# Patient Record
Sex: Female | Born: 1990 | Race: Black or African American | Hispanic: No | Marital: Single | Smoking: Never smoker
Health system: Southern US, Community
[De-identification: ages and names within clinical notes are randomized; demographics above are authoritative.]

## PROBLEM LIST (undated history)

## (undated) DIAGNOSIS — E669 Obesity, unspecified: Secondary | ICD-10-CM

## (undated) DIAGNOSIS — E0500 Thyrotoxicosis with diffuse goiter without thyrotoxic crisis or storm: Secondary | ICD-10-CM

## (undated) DIAGNOSIS — I509 Heart failure, unspecified: Secondary | ICD-10-CM

## (undated) DIAGNOSIS — D649 Anemia, unspecified: Secondary | ICD-10-CM

## (undated) DIAGNOSIS — I10 Essential (primary) hypertension: Secondary | ICD-10-CM

## (undated) DIAGNOSIS — R0600 Dyspnea, unspecified: Secondary | ICD-10-CM

## (undated) DIAGNOSIS — I219 Acute myocardial infarction, unspecified: Secondary | ICD-10-CM

## (undated) DIAGNOSIS — J189 Pneumonia, unspecified organism: Secondary | ICD-10-CM

## (undated) DIAGNOSIS — R519 Headache, unspecified: Secondary | ICD-10-CM

## (undated) DIAGNOSIS — F419 Anxiety disorder, unspecified: Secondary | ICD-10-CM

## (undated) DIAGNOSIS — J302 Other seasonal allergic rhinitis: Secondary | ICD-10-CM

## (undated) DIAGNOSIS — R51 Headache: Secondary | ICD-10-CM

## (undated) DIAGNOSIS — J42 Unspecified chronic bronchitis: Secondary | ICD-10-CM

## (undated) DIAGNOSIS — E119 Type 2 diabetes mellitus without complications: Secondary | ICD-10-CM

---

## 2011-12-21 DIAGNOSIS — I219 Acute myocardial infarction, unspecified: Secondary | ICD-10-CM

## 2011-12-21 HISTORY — DX: Acute myocardial infarction, unspecified: I21.9

## 2012-12-20 HISTORY — PX: EYE SURGERY: SHX253

## 2015-12-01 ENCOUNTER — Encounter (HOSPITAL_COMMUNITY): Payer: Self-pay | Admitting: *Deleted

## 2015-12-01 ENCOUNTER — Emergency Department (HOSPITAL_COMMUNITY)
Admission: EM | Admit: 2015-12-01 | Discharge: 2015-12-01 | Disposition: A | Discharge status: Home / Self Care | Payer: Medicaid Other | Attending: Emergency Medicine | Admitting: Emergency Medicine

## 2015-12-01 DIAGNOSIS — M79644 Pain in right finger(s): Secondary | ICD-10-CM | POA: Diagnosis not present

## 2015-12-01 DIAGNOSIS — L988 Other specified disorders of the skin and subcutaneous tissue: Secondary | ICD-10-CM | POA: Insufficient documentation

## 2015-12-01 DIAGNOSIS — K0889 Other specified disorders of teeth and supporting structures: Secondary | ICD-10-CM | POA: Diagnosis not present

## 2015-12-01 MED ORDER — PENICILLIN V POTASSIUM 500 MG PO TABS: 500.0000 mg | ORAL_TABLET | Freq: Four times a day (QID) | ORAL | Status: AC

## 2015-12-01 MED ORDER — IBUPROFEN 800 MG PO TABS: 800.0000 mg | ORAL_TABLET | Freq: Three times a day (TID) | ORAL | Status: DC

## 2015-12-01 NOTE — ED Notes (Signed)
Declined W/C at D/C and was escorted to lobby by RN. 

## 2015-12-01 NOTE — Discharge Instructions (Signed)
Dental Pain  ° ° °Dental pain may be caused by many things, including:  °Tooth decay (cavities or caries). Cavities expose the nerve of your tooth to air and hot or cold temperatures. This can cause pain or discomfort.  °Abscess or infection. A dental abscess is a collection of infected pus from a bacterial infection in the inner part of the tooth (pulp). It usually occurs at the end of the tooth's root.  °Injury.  °An unknown reason (idiopathic). °Your pain may be mild or severe. It may only occur when:  °You are chewing.  °You are exposed to hot or cold temperature.  °You are eating or drinking sugary foods or beverages, such as soda or candy. °Your pain may also be constant.  °HOME CARE INSTRUCTIONS  °Watch your dental pain for any changes. The following actions may help to lessen any discomfort that you are feeling:  °Take medicines only as directed by your dentist.  °If you were prescribed an antibiotic medicine, finish all of it even if you start to feel better.  °Keep all follow-up visits as directed by your dentist. This is important.  °Do not apply heat to the outside of your face.  °Rinse your mouth or gargle with salt water if directed by your dentist. This helps with pain and swelling.  °You can make salt water by adding ¼ tsp of salt to 1 cup of warm water. °Apply ice to the painful area of your face:  °Put ice in a plastic bag.  °Place a towel between your skin and the bag.  °Leave the ice on for 20 minutes, 2-3 times per day. °Avoid foods or drinks that cause you pain, such as:  °Very hot or very cold foods or drinks.  °Sweet or sugary foods or drinks. °SEEK MEDICAL CARE IF:  °Your pain is not controlled with medicines.  °Your symptoms are worse.  °You have new symptoms. °SEEK IMMEDIATE MEDICAL CARE IF:  °You are unable to open your mouth.  °You are having trouble breathing or swallowing.  °You have a fever.  °Your face, neck, or jaw is swollen. °This information is not intended to replace advice  given to you by your health care provider. Make sure you discuss any questions you have with your health care provider.  °Document Released: 12/06/2005 Document Revised: 04/22/2015 Document Reviewed: 12/02/2014  °Elsevier Interactive Patient Education ©2016 Elsevier Inc.  °Dental Abscess  ° ° °A dental abscess is a collection of pus in or around a tooth.  °CAUSES  °This condition is caused by a bacterial infection around the root of the tooth that involves the inner part of the tooth (pulp). It may result from:  °Severe tooth decay.  °Trauma to the tooth that allows bacteria to enter into the pulp, such as a broken or chipped tooth.  °Severe gum disease around a tooth. °SYMPTOMS  °Symptoms of this condition include:  °Severe pain in and around the infected tooth.  °Swelling and redness around the infected tooth, in the mouth, or in the face.  °Tenderness.  °Pus drainage.  °Bad breath.  °Bitter taste in the mouth.  °Difficulty swallowing.  °Difficulty opening the mouth.  °Nausea.  °Vomiting.  °Chills.  °Swollen neck glands.  °Fever. °DIAGNOSIS  °This condition is diagnosed with examination of the infected tooth. During the exam, your dentist may tap on the infected tooth. Your dentist will also ask about your medical and dental history and may order X-rays.  °TREATMENT  °This condition is   treated by eliminating the infection. This may be done with:  °Antibiotic medicine.  °A root canal. This may be performed to save the tooth.  °Pulling (extracting) the tooth. This may also involve draining the abscess. This is done if the tooth cannot be saved. °HOME CARE INSTRUCTIONS  °Take medicines only as directed by your dentist.  °If you were prescribed antibiotic medicine, finish all of it even if you start to feel better.  °Rinse your mouth (gargle) often with salt water to relieve pain or swelling.  °Do not drive or operate heavy machinery while taking pain medicine.  °Do not apply heat to the outside of your mouth.  °Keep  all follow-up visits as directed by your dentist. This is important. °SEEK MEDICAL CARE IF:  °Your pain is worse and is not helped by medicine. °SEEK IMMEDIATE MEDICAL CARE IF:  °You have a fever or chills.  °Your symptoms suddenly get worse.  °You have a very bad headache.  °You have problems breathing or swallowing.  °You have trouble opening your mouth.  °You have swelling in your neck or around your eye. °This information is not intended to replace advice given to you by your health care provider. Make sure you discuss any questions you have with your health care provider.  °Document Released: 12/06/2005 Document Revised: 04/22/2015 Document Reviewed: 12/03/2014  °Elsevier Interactive Patient Education ©2016 Elsevier Inc.  ° °Emergency Department Resource Guide °1) Find a Doctor and Pay Out of Pocket °Although you won't have to find out who is covered by your insurance plan, it is a good idea to ask around and get recommendations. You will then need to call the office and see if the doctor you have chosen will accept you as a new patient and what types of options they offer for patients who are self-pay. Some doctors offer discounts or will set up payment plans for their patients who do not have insurance, but you will need to ask so you aren't surprised when you get to your appointment. ° °2) Contact Your Local Health Department °Not all health departments have doctors that can see patients for sick visits, but many do, so it is worth a call to see if yours does. If you don't know where your local health department is, you can check in your phone book. The CDC also has a tool to help you locate your state's health department, and many state websites also have listings of all of their local health departments. ° °3) Find a Walk-in Clinic °If your illness is not likely to be very severe or complicated, you may want to try a walk in clinic. These are popping up all over the country in pharmacies, drugstores, and  shopping centers. They're usually staffed by nurse practitioners or physician assistants that have been trained to treat common illnesses and complaints. They're usually fairly quick and inexpensive. However, if you have serious medical issues or chronic medical problems, these are probably not your best option. ° °No Primary Care Doctor: °- Call [facility] at  832-8000 - they can help you locate a primary care doctor that  accepts your insurance, provides certain services, etc. °- Physician Referral Service- 1-800-533-3463 ° °Chronic Pain Problems: °Organization         Address  Phone   Notes  °[facility]  (336) 297-2271 Patients need to be referred by their primary care doctor.  ° °Medication Assistance: °Organization         Address    Phone   Notes  °[place] [facility] [address] °[place], [place] [address] (336) 641-8030 --Must be a resident of [place] °-- Must have NO insurance coverage whatsoever (no Medicaid/ Medicare, etc.) °-- The pt. MUST have a primary care doctor that directs their care regularly and follows them in the community °  °[facility]  (866) 331-1348   °[employer]  (888) 892-1162   ° °Agencies that provide inexpensive medical care: °Organization         Address  Phone   Notes  °[facility]  (336) 832-8035   °[facility]    (336) 832-7272   °[facility] [address] °[place], [place] [address] (336) 832-4777   °[facility] [address], °[place] (336) 271-4999   °[facility]    (336) 373-0678   °[facility]    (336) 272-1050   °[facility] ° [address], [place] Phone:  (336) 832-4444, Fax:  (336) 832-4440 Hours of Operation:  9 am - 6 pm, M-F.  Also accepts Medicaid/Medicare and self-pay.  °[facility] ° [address], [place] Phone: (336) 832-3150,  Fax: (336) 832-3151. Hours of Operation:  8:30 am - 5:30 pm, M-F.  Also accepts Medicaid and self-pay.  °[facility] [place] [address], [place] Phone: (336) 878-6027   °[facility] [address], [place], [place] (336)723-1848, Ext. 123 Mondays & Thursdays: 7-9 AM.  First 15 patients are seen on a first come, first serve basis. °  ° °Medicaid-accepting [place] Providers: ° °Organization         Address  Phone   Notes  °[facility] [address], [place] (336) 641-2100 Also accepts self-pay patients.  °[facility] [address], [place] ° (336) 856-9996   °[facility] [address], [place] (336) 288-8857   °[facility] [address] [place] [address], [place] (336) 299-7000   °Veita Bland [address], [place]  ° (336) 373-1557 Only accepts [place] Access Medicaid patients after they have their name applied to their card.  ° °Self-Pay (no insurance) in [place]: ° °Organization         Address  Phone   Notes  °Sickle Cell Patients, [facility] [address], [place] (336) 832-1970   °[facility] [address], [place] (336) 832-4400   ° [facility] ° [address], [place] (336) 992-4800   °[facility]/Dr. Osei-Bonsu ° [address] [place] [address], [place] or [address], [place] (336) [phone] Phone number for both [place] and [place] locations is the same.  °[facility] [address], [place] (336) 299-0000   °[facility] [address] [place] [address], [place] or [address] (336) 852-7530 °(336) 878-2260   °[facility] [address], [place] (336) 350-1642, phone; (336) 294-5005, fax Sees patients 1st and 3rd Saturday of every month.  Must not qualify for public or private  insurance (i.e. Medicaid, Medicare, [place] Health Choice, Veterans' Benefits) • Household income should be no more than 200% of the poverty level •The clinic cannot treat you if you are pregnant or think you are pregnant •   Sexually transmitted diseases are not treated at the clinic.  ° ° °Dental Care: °Organization         Address  Phone  Notes  °[facility] [address], [place] (336) 641-6152 Accepts children up to age 21 who are enrolled in Medicaid or [place] Health Choice; pregnant women with a Medicaid card; and children who have applied for Medicaid or [place] Health Choice, but were declined, whose parents can pay a reduced fee at time of service.  °[facility] [place]  [address], [place] (336) 641-7733 Accepts children up to age 21 who are enrolled in Medicaid or [place] Health Choice; pregnant women with a Medicaid card; and children who have applied for Medicaid or [place] Health Choice, but were declined, whose parents can pay a reduced fee at time of service.  °[facility] ° [address], [place] (336) 641-4533 Patients are seen by appointment only. Walk-ins are not accepted. [facility] will see patients 18 years of age and older. °Monday - Tuesday (8am-5pm) °Most Wednesdays (8:30-5pm) °$30 per visit, cash only  °[facility] ° [address], [place] (336) 641-4533 Patients are seen by appointment only. Walk-ins are not accepted. [facility] will see patients 18 years of age and older. °One Wednesday Evening (Monthly: Volunteer Based).  $30 per visit, cash only  °[facility]  (919) 537-3737 for adults; Children under age 4, call Graduate Pediatric Dentistry at (919) [phone]. Children aged 4-14, please call (919) [phone] to request a pediatric application. ° Dental services are provided in all areas of dental care  including fillings, crowns and bridges, complete and partial dentures, implants, gum treatment, root canals, and extractions. Preventive care is also provided. Treatment is provided to both adults and children. °Patients are selected via a lottery and there is often a waiting list. °  °[facility] [address], °[place] ° (336) 763-8833 [email or web address] °  °[facility] [address], [place], [place] (336)723-1848, Ext. 123 Second and Fourth Thursday of each month, opens at 6:30 AM; Clinic ends at 9 AM.  Patients are seen on a first-come first-served basis, and a limited number are seen during each clinic.  ° °[facility] ° [address], [place], [place] (336) 723-7904   Eligibility Requirements °You must have lived in [place], [place], or [place] counties for at least the last three months. °  You cannot be eligible for state or federal sponsored healthcare insurance, including Veterans Administration, Medicaid, or Medicare. °  You generally cannot be eligible for healthcare insurance through your employer.  °  How to apply: °Eligibility screenings are held every Tuesday and Wednesday afternoon from 1:00 pm until 4:00 pm. You do not need an appointment for the interview!  °[facility] [address], [place], [place] 336-631-2330   °[facility] [facility]  336-342-8273   °[facility]  336-703-3100   °[facility]  336-570-6415   ° °Behavioral Health Resources in the Community: °Intensive Outpatient Programs °Organization         Address  Phone  Notes  °[facility] [address], [place], [place] 336-878-6098   °[facility] [address], [place], [place] 336-832-9800   °[facility] [address], , [place] °   336-882-2125   °[facility] [address],  °[place], [place] 1-800-853-5163 or 336-641-4981     °Substance Abuse Resources °Organization         Address  Phone  Notes  °[facility]  336-882-2125   °[facility]  336-784-9470   °[facility]  336-285-9073   °[facility]  336-845-3988   °Residential & Outpatient Substance Abuse Program  1-800-659-3381   °Psychological Services °Organization         Address  Phone  Notes  °[facility]  336- 832-9600   °[facility]  336- 378-7881   °[facility] [address], [place] 1-800-853-5163 or 336-641-4981   ° °Mobile Crisis Teams °Organization         Address  Phone  Notes  °[facility], Mobile Crisis Care Unit  1-877-626-1772   °Assertive °[facility] ° [address] [place], [place] 336-834-9664   °Sharon DeEsch [address] °[place] 336-554-5454   ° °Self-Help/Support Groups °Organization         Address  Phone             Notes  °[facility] [place] - variety of support groups  336- 373-1402 Call for more information  °Narcotics Anonymous (NA), [facility] [address], °[place]  2 meetings at this location  ° °Residential Treatment Programs °Organization         Address  Phone  Notes  °[facility] [address],    °[place]  1-866-801-8205   °[facility] ° [address], [place], [place] 704-293-8524   °[facility] [address], [place] 336-845-3988 Admissions: 8am-3pm M-F  °[facility] [address]    °[place], [place] 336-841-1104   °[facility] [address], [place], [place] 336-379-7146   °[facility] [address]  °[place], [place] 336-285-9073   °[facility] - Intensive Outpatient [address], [place], [place] 336-852-3033   °[facility] [address]  °[place], [place] 1-877-615-2722 or 336-784-9470   °[facility] [address] , [place] 336-227-7417 Accepts Medicaid  °[facility] [address]  °[place] 1-800-659-3381 Substance Abuse/Addiction Treatment  ° °[place] Behavioral Health Resources °Organization         Address  Phone  Notes  °[facility]  (888) 581-9988   °Julie Brannon, PhD [address] [place], [place]   (336) 349-5553 or (336) 951-0000   °[facility]   [address] °[place], [place] (336) 349-4454   °[facility] [address], [place], [place] (336) 342-8316 Insurance/Medicaid/sponsorship through [facility]  °[facility] [address]                                    [place], [place] (336) 342-8316 Therapy/tele-psych/case  °[facility] [address]  ° [place], [place] (336) 349-2233    °Dr. Arfeen  (336) 349-4544   °[facility] [place]  [employer] [place] [facility] 1) [address], [place] °2) [address], [place] °3)  [address], [place] (336) 349-3220 °(336) 342-7768 ° °(336) 342-8140   °[place] [facility] (336) 342-1394 or (  336) 342-3537 (After Hours)    ° ° ° °

## 2015-12-01 NOTE — ED Notes (Signed)
PT reports infection at wisdom tooth Lt side

## 2015-12-01 NOTE — ED Provider Notes (Signed)
CSN: [id number]     Arrival date & time 12/01/15  1058 History   By signing my name below, I, Freida Busman, attest that this documentation has been prepared under the direction and in the presence of non-physician practitioner, Cheri Fowler, PA-C. Electronically Signed: Freida Busman, Scribe. 12/01/2015. 12:23 PM.    Chief Complaint  Patient presents with  . Dental Problem    The history is provided by the patient. No language interpreter was used.     HPI Comments:  Tara [name] is a 24 y.o. female who presents to the Emergency Department complaining of moderate left lower dental pain which began last night. Her pain is exacerbated with cold beverages. She has taken tylenol without relief. Pt denies fever, chills, neck stiffness, difficulty breathing, and difficulty swallowing.   Pt also complains of right thumb pain. She notes this is recurrent issue x months. Pt states he has a small crack at the site that has reopened. She denies recent injury. No alleviating factors noted.   History reviewed. No pertinent past medical history. History reviewed. No pertinent past surgical history. History reviewed. No pertinent family history. Social History  Substance Use Topics  . Smoking status: Never Smoker   . Smokeless tobacco: Never Used  . Alcohol Use: No   OB History    No data available     Review of Systems  Constitutional: Negative for fever and chills.  HENT: Positive for dental problem.   Respiratory: Negative for shortness of breath.   Cardiovascular: Negative for chest pain.  Musculoskeletal: Positive for arthralgias (finger pain ).  All other systems reviewed and are negative.   Allergies  Iodine  Home Medications   Prior to Admission medications   Medication Sig Start Date End Date Taking? Authorizing Provider  ibuprofen (ADVIL,MOTRIN) 800 MG tablet Take 1 tablet (800 mg total) by mouth 3 (three) times daily. 12/01/15   Cheri Fowler, PA-C  penicillin v  potassium (VEETID) 500 MG tablet Take 1 tablet (500 mg total) by mouth 4 (four) times daily. 12/01/15 12/08/15  Lounette Sloan, PA-C   BP 143/74 mmHg  Pulse 70  Temp(Src) 98.4 F (36.9 C) (Oral)  Resp 16  Ht 5\' 6"  (1.676 m)  Wt 127.007 kg  BMI 45.21 kg/m2  SpO2 99%  LMP 11/03/2015 Physical Exam  Constitutional: She is oriented to person, place, and time. She appears well-developed and well-nourished. No distress.  HENT:  Head: Normocephalic and atraumatic.  Right Ear: External ear normal.  Left Ear: External ear normal.  Mouth/Throat: Uvula is midline, oropharynx is clear and moist and mucous membranes are normal. No trismus in the jaw. Normal dentition. No uvula swelling or dental caries. No oropharyngeal exudate, posterior oropharyngeal edema, posterior oropharyngeal erythema or tonsillar abscesses.    No facial swelling.  Tolerating secretions without difficulty.  Eyes: Conjunctivae are normal.  Neck: Normal range of motion. Neck supple.  No signs of Ludwig angina.   Cardiovascular: Normal rate, regular rhythm and normal heart sounds.   Pulmonary/Chest: Effort normal and breath sounds normal.  Abdominal: She exhibits no distension.  Musculoskeletal: Normal range of motion.  Lymphadenopathy:    She has no cervical adenopathy.  Neurological: She is alert and oriented to person, place, and time.  Skin: Skin is warm and dry.  Dry cracked skin at the fingertips, most notably in right thumb.  No erythema, warmth, drainage, induration,  Fluctuance, or other signs of infection.  Psychiatric: She has a normal mood and affect.  Nursing  note and vitals reviewed.   ED Course  Procedures   DIAGNOSTIC STUDIES:  Oxygen Saturation is 99% on RA, normal by my interpretation.    COORDINATION OF CARE:  12:22 PM Discussed treatment plan with pt at bedside and pt agreed to plan.   MDM   Final diagnoses:  Pain, dental    Suspect dental pain associated with dental infection and  possible dental abscess with patient afebrile, non toxic appearing and swallowing secretions well. I gave patient referral to dentist and stressed the importance of dental follow up for ultimate management of dental pain. Discussed return precautions.  Patient expresses understanding and agrees with plan.  I will also give penicillin VK and pain control.    I personally performed the services described in this documentation, which was scribed in my presence. The recorded information has been reviewed and is accurate.    Cheri Fowler, PA-C 12/01/15 1228  Mancel Bale, MD 12/02/15 [phone]

## 2015-12-07 ENCOUNTER — Telehealth: Payer: Self-pay | Admitting: *Deleted

## 2015-12-07 NOTE — Telephone Encounter (Signed)
Good Rx Card texted to patient for 28 tablets Veetids at [facility] $10.00. Patient instructed to complete entire prescription. Carlye Grippe reported this was more affordable than the 20.00 she had originally been quoted by the pharmacy. No further CM needs.

## 2015-12-12 ENCOUNTER — Emergency Department (HOSPITAL_COMMUNITY): Payer: Medicaid Other

## 2015-12-12 ENCOUNTER — Encounter (HOSPITAL_COMMUNITY): Payer: Self-pay | Admitting: *Deleted

## 2015-12-12 ENCOUNTER — Emergency Department (HOSPITAL_COMMUNITY)
Admission: EM | Admit: 2015-12-12 | Discharge: 2015-12-12 | Disposition: A | Discharge status: Home / Self Care | Payer: Medicaid Other | Attending: Emergency Medicine | Admitting: Emergency Medicine

## 2015-12-12 DIAGNOSIS — O99281 Endocrine, nutritional and metabolic diseases complicating pregnancy, first trimester: Secondary | ICD-10-CM | POA: Diagnosis not present

## 2015-12-12 DIAGNOSIS — Z79899 Other long term (current) drug therapy: Secondary | ICD-10-CM | POA: Diagnosis not present

## 2015-12-12 DIAGNOSIS — O9989 Other specified diseases and conditions complicating pregnancy, childbirth and the puerperium: Secondary | ICD-10-CM | POA: Diagnosis present

## 2015-12-12 DIAGNOSIS — E0590 Thyrotoxicosis, unspecified without thyrotoxic crisis or storm: Secondary | ICD-10-CM | POA: Insufficient documentation

## 2015-12-12 DIAGNOSIS — O26899 Other specified pregnancy related conditions, unspecified trimester: Secondary | ICD-10-CM

## 2015-12-12 DIAGNOSIS — Z3A01 Less than 8 weeks gestation of pregnancy: Secondary | ICD-10-CM | POA: Insufficient documentation

## 2015-12-12 DIAGNOSIS — R1030 Lower abdominal pain, unspecified: Secondary | ICD-10-CM | POA: Insufficient documentation

## 2015-12-12 DIAGNOSIS — Z3490 Encounter for supervision of normal pregnancy, unspecified, unspecified trimester: Secondary | ICD-10-CM

## 2015-12-12 DIAGNOSIS — R109 Unspecified abdominal pain: Secondary | ICD-10-CM

## 2015-12-12 DIAGNOSIS — Z791 Long term (current) use of non-steroidal anti-inflammatories (NSAID): Secondary | ICD-10-CM | POA: Insufficient documentation

## 2015-12-12 LAB — CBC WITH DIFFERENTIAL/PLATELET
BASOS PCT: 0 %
Basophils Absolute: 0 10*3/uL (ref 0.0–0.1)
EOS ABS: 0.1 10*3/uL (ref 0.0–0.7)
EOS PCT: 2 %
HCT: 37.9 % (ref 36.0–46.0)
Hemoglobin: 12.1 g/dL (ref 12.0–15.0)
LYMPHS ABS: 2.8 10*3/uL (ref 0.7–4.0)
Lymphocytes Relative: 38 %
MCH: 23.4 pg — AB (ref 26.0–34.0)
MCHC: 31.9 g/dL (ref 30.0–36.0)
MCV: 73.3 fL — ABNORMAL LOW (ref 78.0–100.0)
MONOS PCT: 6 %
Monocytes Absolute: 0.5 10*3/uL (ref 0.1–1.0)
NEUTROS PCT: 54 %
Neutro Abs: 4 10*3/uL (ref 1.7–7.7)
PLATELETS: 303 10*3/uL (ref 150–400)
RBC: 5.17 MIL/uL — AB (ref 3.87–5.11)
RDW: 15.2 % (ref 11.5–15.5)
WBC: 7.5 10*3/uL (ref 4.0–10.5)

## 2015-12-12 LAB — COMPREHENSIVE METABOLIC PANEL
ALBUMIN: 3 g/dL — AB (ref 3.5–5.0)
ALT: 18 U/L (ref 14–54)
ANION GAP: 6 (ref 5–15)
AST: 18 U/L (ref 15–41)
Alkaline Phosphatase: 123 U/L (ref 38–126)
BUN: 7 mg/dL (ref 6–20)
CHLORIDE: 110 mmol/L (ref 101–111)
CO2: 23 mmol/L (ref 22–32)
Calcium: 8.7 mg/dL — ABNORMAL LOW (ref 8.9–10.3)
Creatinine, Ser: 0.5 mg/dL (ref 0.44–1.00)
GFR calc non Af Amer: >60 mL/min (ref >60)
GLUCOSE: 107 mg/dL — AB (ref 65–99)
Potassium: 3.7 mmol/L (ref 3.5–5.1)
SODIUM: 139 mmol/L (ref 135–145)
Total Bilirubin: 0.5 mg/dL (ref 0.3–1.2)
Total Protein: 5.9 g/dL — ABNORMAL LOW (ref 6.5–8.1)

## 2015-12-12 LAB — URINALYSIS, ROUTINE W REFLEX MICROSCOPIC
BILIRUBIN URINE: NEGATIVE
GLUCOSE, UA: NEGATIVE mg/dL
Hgb urine dipstick: NEGATIVE
Ketones, ur: 15 mg/dL — AB
LEUKOCYTES UA: NEGATIVE
NITRITE: NEGATIVE
Protein, ur: 30 mg/dL — AB
Specific Gravity, Urine: 1.024 (ref 1.005–1.030)
pH: 5.5 (ref 5.0–8.0)

## 2015-12-12 LAB — I-STAT BETA HCG BLOOD, ED (MC, WL, AP ONLY): HCG, QUANTITATIVE: 699.2 m[IU]/mL — AB (ref <5)

## 2015-12-12 LAB — URINE MICROSCOPIC-ADD ON

## 2015-12-12 MED ORDER — METHIMAZOLE 5 MG PO TABS: 5.0000 mg | ORAL_TABLET | Freq: Every day | ORAL | Status: DC

## 2015-12-12 MED ORDER — GOODSENSE PRENATAL VITAMINS 28-0.8 MG PO TABS: 1.0000 | ORAL_TABLET | Freq: Every day | ORAL | Status: DC

## 2015-12-12 NOTE — Care Management (Signed)
Patient had a + pregnancy test in the ED today,.ED CM met with patient regarding follow patient not having a PCP or, OB follow up.  Patient states, she recently relocated to the area, and does not have any one as of yet. Patient has Medicaid, Referral sent to [facility]. CM verified patient's contact information.  Patient made aware Clinic will contact her to arrange follow up appt.  Teach back done, verbalized understanding. No further ED CM needs identifed

## 2015-12-12 NOTE — ED Notes (Signed)
Pt reports lower abd pain and pressure, denies vaginal discharge, itching or bleeding. Denies urinary symptoms. This am had + home preg test.

## 2015-12-12 NOTE — ED Notes (Signed)
Pt is in stable condition upon d/c and ambulates from ED. 

## 2015-12-12 NOTE — ED Notes (Signed)
Patient transported to Ultrasound 

## 2015-12-12 NOTE — ED Provider Notes (Signed)
CSN: [id number]     Arrival date & time 12/12/15  1317 History   First MD Initiated Contact with Patient 12/12/15 1348     Chief Complaint  Patient presents with  . Abdominal Pain     Patient is a 24 y.o. female presenting with abdominal pain. The history is provided by the patient.  Abdominal Pain Pain location: lower abd pain. Pain quality: pressure   Pain radiates to:  Does not radiate Pain severity:  Mild Onset quality:  Gradual Duration: several days. Timing:  Constant Progression:  Unchanged Chronicity:  New Relieved by:  Nothing Worsened by:  Nothing tried Associated symptoms: no diarrhea, no dysuria, no fever, no vaginal bleeding, no vaginal discharge and no vomiting   Risk factors: pregnancy     PMH - Hyperthyroid/Graves Disease Social History  Substance Use Topics  . Smoking status: Never Smoker   . Smokeless tobacco: Never Used  . Alcohol Use: No   OB History    No data available     Review of Systems  Constitutional: Negative for fever.  Gastrointestinal: Positive for abdominal pain. Negative for vomiting and diarrhea.  Genitourinary: Negative for dysuria, vaginal bleeding and vaginal discharge.  All other systems reviewed and are negative.     Allergies  Iodine  Home Medications   Prior to Admission medications   Medication Sig Start Date End Date Taking? Authorizing Provider  methimazole (TAPAZOLE) 5 MG tablet Take 5 mg by mouth 3 (three) times daily.   Yes Historical Provider, MD  metoprolol (LOPRESSOR) 50 MG tablet Take 50 mg by mouth 2 (two) times daily.   Yes Historical Provider, MD  ibuprofen (ADVIL,MOTRIN) 800 MG tablet Take 1 tablet (800 mg total) by mouth 3 (three) times daily. 12/01/15   Kayla Rose, PA-C   BP 123/78 mmHg  Pulse 87  Temp(Src) 98.3 F (36.8 C) (Oral)  Resp 14  SpO2 100%  LMP 11/03/2015 Physical Exam CONSTITUTIONAL: Well developed/well nourished HEAD: Normocephalic/atraumatic EYES: chronic exopthalmos ENMT:  Mucous membranes moist NECK: supple no meningeal signs CV: S1/S2 noted, no murmurs/rubs/gallops noted LUNGS: Lungs are clear to auscultation bilaterally, no apparent distress ABDOMEN: soft, nontender, no rebound or guarding, bowel sounds noted throughout abdomen GU:no cva tenderness NEURO: Pt is awake/alert/appropriate, moves all extremitiesx4.  No facial droop.   EXTREMITIES: pulses normal/equal, full ROM SKIN: warm, color normal PSYCH: no abnormalities of mood noted, alert and oriented to situation  ED Course  Procedures  Labs Review Labs Reviewed  URINALYSIS, ROUTINE W REFLEX MICROSCOPIC (NOT AT [facility]) - Abnormal; Notable for the following:    Color, Urine AMBER (*)    APPearance CLOUDY (*)    Ketones, ur 15 (*)    Protein, ur 30 (*)    All other components within normal limits  URINE MICROSCOPIC-ADD ON - Abnormal; Notable for the following:    Squamous Epithelial / LPF 0-5 (*)    Bacteria, UA RARE (*)    All other components within normal limits  CBC WITH DIFFERENTIAL/PLATELET - Abnormal; Notable for the following:    RBC 5.17 (*)    MCV 73.3 (*)    MCH 23.4 (*)    All other components within normal limits  COMPREHENSIVE METABOLIC PANEL - Abnormal; Notable for the following:    Glucose, Bld 107 (*)    Calcium 8.7 (*)    Total Protein 5.9 (*)    Albumin 3.0 (*)    All other components within normal limits  I-STAT BETA HCG BLOOD, ED ([facility],  [facility], [facility] ONLY) - Abnormal; Notable for the following:    I-stat hCG, quantitative 699.2 (*)    All other components within normal limits  RPR  HIV ANTIBODY (ROUTINE TESTING)  GC/CHLAMYDIA PROBE AMP ([place]) NOT AT [facility]   I have personally reviewed and evaluated these  lab results as part of my medical decision-making.  2:52 PM Pt just found she is pregnant with abd pressure Will need to proceed with OB ultrasound She also is supposed to be on meds for HTN and Graves disease but has not had them in several days as she has no local  PCP/OBGYN Will call OB for guidance 3:59 PM D/w dr Adrian Blackwater with OB We reviewed meds Will stop lopressor as HR/BP normal Continue tapazole 5mg  daily Will need f/u in high risk clinic 4:19 PM US reveals probable early pregnancy, but recommend repeat US in next 7-14 days Pt will need close f/u due to her co-morbidities Pt well appearing, no distress, talking on phone She wants to go home and refuses pelvic exam (she denies h/o STD and no exposure to STD) Will also place on prenatal vitamins  MDM   Final diagnoses:  Pregnancy  Abdominal pain, unspecified abdominal location    Nursing notes including past medical history and social history reviewed and considered in documentation Labs/vital reviewed myself and considered during evaluation     Zadie Rhine, MD 12/12/15 1620

## 2015-12-13 LAB — RPR: RPR: NONREACTIVE

## 2015-12-13 LAB — HIV ANTIBODY (ROUTINE TESTING W REFLEX): HIV Screen 4th Generation wRfx: NONREACTIVE

## 2015-12-17 ENCOUNTER — Encounter (HOSPITAL_COMMUNITY): Payer: Self-pay | Admitting: Emergency Medicine

## 2015-12-17 ENCOUNTER — Emergency Department (HOSPITAL_COMMUNITY)
Admission: EM | Admit: 2015-12-17 | Discharge: 2015-12-17 | Discharge status: Against Medical Advice | Payer: Medicaid Other | Attending: Emergency Medicine | Admitting: Emergency Medicine

## 2015-12-17 DIAGNOSIS — O99211 Obesity complicating pregnancy, first trimester: Secondary | ICD-10-CM | POA: Insufficient documentation

## 2015-12-17 DIAGNOSIS — R110 Nausea: Secondary | ICD-10-CM | POA: Diagnosis not present

## 2015-12-17 DIAGNOSIS — O10011 Pre-existing essential hypertension complicating pregnancy, first trimester: Secondary | ICD-10-CM | POA: Diagnosis not present

## 2015-12-17 DIAGNOSIS — R1084 Generalized abdominal pain: Secondary | ICD-10-CM | POA: Diagnosis not present

## 2015-12-17 DIAGNOSIS — O9989 Other specified diseases and conditions complicating pregnancy, childbirth and the puerperium: Secondary | ICD-10-CM | POA: Diagnosis present

## 2015-12-17 DIAGNOSIS — Z3A01 Less than 8 weeks gestation of pregnancy: Secondary | ICD-10-CM | POA: Insufficient documentation

## 2015-12-17 DIAGNOSIS — E669 Obesity, unspecified: Secondary | ICD-10-CM | POA: Insufficient documentation

## 2015-12-17 HISTORY — DX: Essential (primary) hypertension: I10

## 2015-12-17 HISTORY — DX: Thyrotoxicosis with diffuse goiter without thyrotoxic crisis or storm: E05.00

## 2015-12-17 LAB — I-STAT BETA HCG BLOOD, ED (MC, WL, AP ONLY): I-stat hCG, quantitative: >2000 m[IU]/mL — ABNORMAL HIGH (ref <5)

## 2015-12-17 LAB — COMPREHENSIVE METABOLIC PANEL
ALK PHOS: 118 U/L (ref 38–126)
ALT: 21 U/L (ref 14–54)
ANION GAP: 10 (ref 5–15)
AST: 21 U/L (ref 15–41)
Albumin: 3.3 g/dL — ABNORMAL LOW (ref 3.5–5.0)
BILIRUBIN TOTAL: 0.5 mg/dL (ref 0.3–1.2)
BUN: 7 mg/dL (ref 6–20)
CALCIUM: 9 mg/dL (ref 8.9–10.3)
CO2: 22 mmol/L (ref 22–32)
CREATININE: 0.53 mg/dL (ref 0.44–1.00)
Chloride: 108 mmol/L (ref 101–111)
GFR calc non Af Amer: >60 mL/min (ref >60)
Glucose, Bld: 83 mg/dL (ref 65–99)
Potassium: 3.8 mmol/L (ref 3.5–5.1)
Sodium: 140 mmol/L (ref 135–145)
TOTAL PROTEIN: 6.8 g/dL (ref 6.5–8.1)

## 2015-12-17 LAB — CBC
HCT: 34.5 % — ABNORMAL LOW (ref 36.0–46.0)
Hemoglobin: 10.7 g/dL — ABNORMAL LOW (ref 12.0–15.0)
MCH: 22.7 pg — ABNORMAL LOW (ref 26.0–34.0)
MCHC: 31 g/dL (ref 30.0–36.0)
MCV: 73.2 fL — ABNORMAL LOW (ref 78.0–100.0)
PLATELETS: 274 10*3/uL (ref 150–400)
RBC: 4.71 MIL/uL (ref 3.87–5.11)
RDW: 15.3 % (ref 11.5–15.5)
WBC: 5.7 10*3/uL (ref 4.0–10.5)

## 2015-12-17 LAB — LIPASE, BLOOD: Lipase: 18 U/L (ref 11–51)

## 2015-12-17 NOTE — ED Notes (Addendum)
Pt from home for eval of  Generalized abd pain x3 days pt reports some nausea denies any fevers or diarrhea at this time. Denies any vaginal discharge, or other urinary symptoms. nad noted. Pt reports approx [redacted] weeks pregnant. Denies any vaginal bleeding.

## 2015-12-17 NOTE — ED Notes (Signed)
Pt called x 3 no answer 

## 2015-12-18 ENCOUNTER — Emergency Department (HOSPITAL_COMMUNITY): Payer: Medicaid Other

## 2015-12-18 ENCOUNTER — Emergency Department (HOSPITAL_COMMUNITY)
Admission: EM | Admit: 2015-12-18 | Discharge: 2015-12-18 | Disposition: A | Discharge status: Home / Self Care | Payer: Medicaid Other | Attending: Physician Assistant | Admitting: Physician Assistant

## 2015-12-18 ENCOUNTER — Encounter (HOSPITAL_COMMUNITY): Payer: Self-pay | Admitting: *Deleted

## 2015-12-18 DIAGNOSIS — Z3490 Encounter for supervision of normal pregnancy, unspecified, unspecified trimester: Secondary | ICD-10-CM

## 2015-12-18 DIAGNOSIS — Z3A01 Less than 8 weeks gestation of pregnancy: Secondary | ICD-10-CM | POA: Insufficient documentation

## 2015-12-18 DIAGNOSIS — E0590 Thyrotoxicosis, unspecified without thyrotoxic crisis or storm: Secondary | ICD-10-CM | POA: Insufficient documentation

## 2015-12-18 DIAGNOSIS — E669 Obesity, unspecified: Secondary | ICD-10-CM | POA: Insufficient documentation

## 2015-12-18 DIAGNOSIS — I10 Essential (primary) hypertension: Secondary | ICD-10-CM | POA: Diagnosis not present

## 2015-12-18 DIAGNOSIS — O9989 Other specified diseases and conditions complicating pregnancy, childbirth and the puerperium: Secondary | ICD-10-CM | POA: Insufficient documentation

## 2015-12-18 DIAGNOSIS — O99281 Endocrine, nutritional and metabolic diseases complicating pregnancy, first trimester: Secondary | ICD-10-CM | POA: Diagnosis not present

## 2015-12-18 DIAGNOSIS — O99411 Diseases of the circulatory system complicating pregnancy, first trimester: Secondary | ICD-10-CM | POA: Insufficient documentation

## 2015-12-18 DIAGNOSIS — Z79899 Other long term (current) drug therapy: Secondary | ICD-10-CM | POA: Diagnosis not present

## 2015-12-18 DIAGNOSIS — R109 Unspecified abdominal pain: Secondary | ICD-10-CM | POA: Insufficient documentation

## 2015-12-18 DIAGNOSIS — R002 Palpitations: Secondary | ICD-10-CM | POA: Diagnosis not present

## 2015-12-18 HISTORY — DX: Obesity, unspecified: E66.9

## 2015-12-18 MED ORDER — ACETAMINOPHEN 325 MG PO TABS
650.0000 mg | ORAL_TABLET | Freq: Once | ORAL | Status: AC
  Administered 2015-12-18: 650 mg via ORAL
  Filled 2015-12-18: qty 2

## 2015-12-18 NOTE — ED Notes (Addendum)
Pt reports palpitations x 3 days and lower abd pain. Denies n/v/d, urinary or vaginal symptoms. HR 95 at triage. Pt reports being pregnant, unsure of how many weeks, lmp 11/14.

## 2015-12-18 NOTE — ED Notes (Signed)
Pt was here yesterday and had blood work done but left prior to being seen.

## 2015-12-18 NOTE — ED Provider Notes (Signed)
CSN: [id number]     Arrival date & time 12/18/15  1350 History   First MD Initiated Contact with Patient 12/18/15 1912     Chief Complaint  Patient presents with  . Palpitations  . Abdominal Pain     (Consider location/radiation/quality/duration/timing/severity/associated sxs/prior Treatment) HPI   Patient is a 24 year old female presenting with abdominal cramping. She reports that her last LMP was in November. She came here once previously and found to have gestational sacs. She's not had any follow-up since then. She reports occasionally she feels her heart palpating. No chest pain, no SOB, no radiation to arms.  Patient has evidence of multiple PVCs on monitor and on EKG.  Patient has history of thyroid issues. She's been well-controlled. She just moved to the area and is in the process of setting up care at [facility] for this new pregnancy and her thyroid. (She has an appointment in 3 weeks)   Past Medical History  Diagnosis Date  . Hypertension   . Hyperthyroidism   . Graves disease   . Obesity    Past Surgical History  Procedure Laterality Date  . Eye surgery     History reviewed. No pertinent family history. Social History  Substance Use Topics  . Smoking status: Never Smoker   . Smokeless tobacco: Never Used  . Alcohol Use: No   OB History    Gravida Para Term Preterm AB TAB SAB Ectopic Multiple Living   1              Review of Systems  Constitutional: Negative for activity change and fatigue.  HENT: Negative for congestion.   Eyes: Negative for discharge.  Respiratory: Negative for cough and chest tightness.   Cardiovascular: Positive for palpitations. Negative for chest pain and leg swelling.  Gastrointestinal: Negative for abdominal distention.  Genitourinary: Negative for dysuria.  Musculoskeletal: Negative for joint swelling.  Skin: Negative for rash.  Allergic/Immunologic: Negative for immunocompromised state.  Neurological: Negative for seizures.   Psychiatric/Behavioral: Negative for behavioral problems and agitation.      Allergies  Iodine  Home Medications   Prior to Admission medications   Medication Sig Start Date End Date Taking? Authorizing Provider  methimazole (TAPAZOLE) 5 MG tablet Take 1 tablet (5 mg total) by mouth daily. 12/12/15  Yes Zadie Rhine, MD  Prenatal Vit-Fe Fumarate-FA (GOODSENSE PRENATAL VITAMINS) 28-0.8 MG TABS Take 1 tablet by mouth daily. 12/12/15  Yes Zadie Rhine, MD   BP 113/94 mmHg  Pulse 98  Temp(Src) 97.9 F (36.6 C) (Oral)  Resp 22  SpO2 99%  LMP 11/03/2015 Physical Exam  Constitutional: She is oriented to person, place, and time. She appears well-developed and well-nourished.  Stigmata of hyperthyroid.   HENT:  Head: Normocephalic and atraumatic.  Eyes: Conjunctivae are normal. Right eye exhibits no discharge.  Neck: Neck supple.  Cardiovascular: Normal rate, regular rhythm and normal heart sounds.   No murmur heard. Pulmonary/Chest: Effort normal and breath sounds normal. She has no wheezes. She has no rales.  Abdominal: Soft. She exhibits no distension. There is no tenderness.  Musculoskeletal: Normal range of motion. She exhibits no edema.  Neurological: She is oriented to person, place, and time. No cranial nerve deficit.  Skin: Skin is warm and dry. No rash noted. She is not diaphoretic.  Psychiatric: She has a normal mood and affect. Her behavior is normal.  Nursing note and vitals reviewed.   ED Course  Procedures (including critical care time) Labs Review Labs Reviewed - No  data to display  Imaging Review No results found. I have personally reviewed and evaluated these images and lab results as part of my medical decision-making.   EKG Interpretation None      MDM   Final diagnoses:  Pregnancy    Patient is a 24 year old female with past medical history severe for Graves' disease. She is on hyperthyroid medications at home. Patient presents today  with feelings of palpitations and abdominal cramping. Patient states that she was seen and diagnosed with pregnancy couple weeks ago. However she's been unable to follow-up and not sure whether pregnancy is located in doing okay. She has cramping today. No bleeding.   In addition patient has palpitations occasionally. Patient has follow-up with her thyroid with [facility]. On monitor she has multiple PVCs which I think is causing her to feel palpitations. Patient has no actual pain associated with it. Her heart rate is normal.   We will located pregnancy which is initial ultrasound. We will again encouraged her to follow up with [facility] for her thyroid issues.   Pt still with stable vitals, No CP. US shows no definitive pregnancy, two sacs. Explained to patient to have her follow up. She expresses understanding.   Keia Rask Randall An, MD 12/18/15 2145

## 2015-12-18 NOTE — Discharge Instructions (Signed)
Follow up to confirm pregnancy in 1 week. You also need to regulate your thyroid with a PCP. Please return with SOB, bleeding or other concerns.    First Trimester of Pregnancy The first trimester of pregnancy is from week 1 until the end of week 12 (months 1 through 3). A week after a sperm fertilizes an egg, the egg will implant on the wall of the uterus. This embryo will begin to develop into a baby. Genes from you and your partner are forming the baby. The female genes determine whether the baby is a boy or a girl. At 6-8 weeks, the eyes and face are formed, and the heartbeat can be seen on ultrasound. At the end of 12 weeks, all the baby's organs are formed.  Now that you are pregnant, you will want to do everything you can to have a healthy baby. Two of the most important things are to get good prenatal care and to follow your health care provider's instructions. Prenatal care is all the medical care you receive before the baby's birth. This care will help prevent, find, and treat any problems during the pregnancy and childbirth. BODY CHANGES Your body goes through many changes during pregnancy. The changes vary from woman to woman.   You may gain or lose a couple of pounds at first.  You may feel sick to your stomach (nauseous) and throw up (vomit). If the vomiting is uncontrollable, call your health care provider.  You may tire easily.  You may develop headaches that can be relieved by medicines approved by your health care provider.  You may urinate more often. Painful urination may mean you have a bladder infection.  You may develop heartburn as a result of your pregnancy.  You may develop constipation because certain hormones are causing the muscles that push waste through your intestines to slow down.  You may develop hemorrhoids or swollen, bulging veins (varicose veins).  Your breasts may begin to grow larger and become tender. Your nipples may stick out more, and the tissue that  surrounds them (areola) may become darker.  Your gums may bleed and may be sensitive to brushing and flossing.  Dark spots or blotches (chloasma, mask of pregnancy) may develop on your face. This will likely fade after the baby is born.  Your menstrual periods will stop.  You may have a loss of appetite.  You may develop cravings for certain kinds of food.  You may have changes in your emotions from day to day, such as being excited to be pregnant or being concerned that something may go wrong with the pregnancy and baby.  You may have more vivid and strange dreams.  You may have changes in your hair. These can include thickening of your hair, rapid growth, and changes in texture. Some women also have hair loss during or after pregnancy, or hair that feels dry or thin. Your hair will most likely return to normal after your baby is born. WHAT TO EXPECT AT YOUR PRENATAL VISITS During a routine prenatal visit:  You will be weighed to make sure you and the baby are growing normally.  Your blood pressure will be taken.  Your abdomen will be measured to track your baby's growth.  The fetal heartbeat will be listened to starting around week 10 or 12 of your pregnancy.  Test results from any previous visits will be discussed. Your health care provider may ask you:  How you are feeling.  If you are feeling  the baby move.  If you have had any abnormal symptoms, such as leaking fluid, bleeding, severe headaches, or abdominal cramping.  If you are using any tobacco products, including cigarettes, chewing tobacco, and electronic cigarettes.  If you have any questions. Other tests that may be performed during your first trimester include:  Blood tests to find your blood type and to check for the presence of any previous infections. They will also be used to check for low iron levels (anemia) and Rh antibodies. Later in the pregnancy, blood tests for diabetes will be done along with other  tests if problems develop.  Urine tests to check for infections, diabetes, or protein in the urine.  An ultrasound to confirm the proper growth and development of the baby.  An amniocentesis to check for possible genetic problems.  Fetal screens for spina bifida and Down syndrome.  You may need other tests to make sure you and the baby are doing well.  HIV (human immunodeficiency virus) testing. Routine prenatal testing includes screening for HIV, unless you choose not to have this test. HOME CARE INSTRUCTIONS  Medicines  Follow your health care provider's instructions regarding medicine use. Specific medicines may be either safe or unsafe to take during pregnancy.  Take your prenatal vitamins as directed.  If you develop constipation, try taking a stool softener if your health care provider approves. Diet  Eat regular, well-balanced meals. Choose a variety of foods, such as meat or vegetable-based protein, fish, milk and low-fat dairy products, vegetables, fruits, and whole grain breads and cereals. Your health care provider will help you determine the amount of weight gain that is right for you.  Avoid raw meat and uncooked cheese. These carry germs that can cause birth defects in the baby.  Eating four or five small meals rather than three large meals a day may help relieve nausea and vomiting. If you start to feel nauseous, eating a few soda crackers can be helpful. Drinking liquids between meals instead of during meals also seems to help nausea and vomiting.  If you develop constipation, eat more high-fiber foods, such as fresh vegetables or fruit and whole grains. Drink enough fluids to keep your urine clear or pale yellow. Activity and Exercise  Exercise only as directed by your health care provider. Exercising will help you:  Control your weight.  Stay in shape.  Be prepared for labor and delivery.  Experiencing pain or cramping in the lower abdomen or low back is a  good sign that you should stop exercising. Check with your health care provider before continuing normal exercises.  Try to avoid standing for long periods of time. Move your legs often if you must stand in one place for a long time.  Avoid heavy lifting.  Wear low-heeled shoes, and practice good posture.  You may continue to have sex unless your health care provider directs you otherwise. Relief of Pain or Discomfort  Wear a good support bra for breast tenderness.   Take warm sitz baths to soothe any pain or discomfort caused by hemorrhoids. Use hemorrhoid cream if your health care provider approves.   Rest with your legs elevated if you have leg cramps or low back pain.  If you develop varicose veins in your legs, wear support hose. Elevate your feet for 15 minutes, 3-4 times a day. Limit salt in your diet. Prenatal Care  Schedule your prenatal visits by the twelfth week of pregnancy. They are usually scheduled monthly at first, then more  often in the last 2 months before delivery.  Write down your questions. Take them to your prenatal visits.  Keep all your prenatal visits as directed by your health care provider. Safety  Wear your seat belt at all times when driving.  Make a list of emergency phone numbers, including numbers for family, friends, the hospital, and police and fire departments. General Tips  Ask your health care provider for a referral to a local prenatal education class. Begin classes no later than at the beginning of month 6 of your pregnancy.  Ask for help if you have counseling or nutritional needs during pregnancy. Your health care provider can offer advice or refer you to specialists for help with various needs.  Do not use hot tubs, steam rooms, or saunas.  Do not douche or use tampons or scented sanitary pads.  Do not cross your legs for long periods of time.  Avoid cat litter boxes and soil used by cats. These carry germs that can cause birth  defects in the baby and possibly loss of the fetus by miscarriage or stillbirth.  Avoid all smoking, herbs, alcohol, and medicines not prescribed by your health care provider. Chemicals in these affect the formation and growth of the baby.  Do not use any tobacco products, including cigarettes, chewing tobacco, and electronic cigarettes. If you need help quitting, ask your health care provider. You may receive counseling support and other resources to help you quit.  Schedule a dentist appointment. At home, brush your teeth with a soft toothbrush and be gentle when you floss. SEEK MEDICAL CARE IF:   You have dizziness.  You have mild pelvic cramps, pelvic pressure, or nagging pain in the abdominal area.  You have persistent nausea, vomiting, or diarrhea.  You have a bad smelling vaginal discharge.  You have pain with urination.  You notice increased swelling in your face, hands, legs, or ankles. SEEK IMMEDIATE MEDICAL CARE IF:   You have a fever.  You are leaking fluid from your vagina.  You have spotting or bleeding from your vagina.  You have severe abdominal cramping or pain.  You have rapid weight gain or loss.  You vomit blood or material that looks like coffee grounds.  You are exposed to [place] measles and have never had them.  You are exposed to fifth disease or chickenpox.  You develop a severe headache.  You have shortness of breath.  You have any kind of trauma, such as from a fall or a car accident.   This information is not intended to replace advice given to you by your health care provider. Make sure you discuss any questions you have with your health care provider.   Document Released: 11/30/2001 Document Revised: 12/27/2014 Document Reviewed: 10/16/2013 Elsevier Interactive Patient Education [employer]

## 2016-02-11 ENCOUNTER — Emergency Department (HOSPITAL_COMMUNITY): Payer: Medicaid Other

## 2016-02-11 ENCOUNTER — Inpatient Hospital Stay (HOSPITAL_COMMUNITY)
Admission: EM | Admit: 2016-02-11 | Discharge: 2016-02-18 | DRG: 781 | Disposition: A | Discharge status: Home / Self Care | Payer: Medicaid Other | Attending: Internal Medicine | Admitting: Internal Medicine

## 2016-02-11 ENCOUNTER — Emergency Department (HOSPITAL_COMMUNITY)
Admit: 2016-02-11 | Discharge: 2016-02-11 | Disposition: A | Discharge status: Home / Self Care | Payer: Medicaid Other | Attending: Emergency Medicine | Admitting: Emergency Medicine

## 2016-02-11 ENCOUNTER — Encounter (HOSPITAL_COMMUNITY): Payer: Self-pay

## 2016-02-11 DIAGNOSIS — R0902 Hypoxemia: Secondary | ICD-10-CM

## 2016-02-11 DIAGNOSIS — J189 Pneumonia, unspecified organism: Secondary | ICD-10-CM | POA: Insufficient documentation

## 2016-02-11 DIAGNOSIS — I10 Essential (primary) hypertension: Secondary | ICD-10-CM

## 2016-02-11 DIAGNOSIS — Z6841 Body Mass Index (BMI) 40.0 and over, adult: Secondary | ICD-10-CM

## 2016-02-11 DIAGNOSIS — Z3A13 13 weeks gestation of pregnancy: Secondary | ICD-10-CM

## 2016-02-11 DIAGNOSIS — J9601 Acute respiratory failure with hypoxia: Secondary | ICD-10-CM | POA: Diagnosis present

## 2016-02-11 DIAGNOSIS — J9621 Acute and chronic respiratory failure with hypoxia: Secondary | ICD-10-CM | POA: Diagnosis not present

## 2016-02-11 DIAGNOSIS — Z3490 Encounter for supervision of normal pregnancy, unspecified, unspecified trimester: Secondary | ICD-10-CM | POA: Insufficient documentation

## 2016-02-11 DIAGNOSIS — Z91013 Allergy to seafood: Secondary | ICD-10-CM

## 2016-02-11 DIAGNOSIS — O10911 Unspecified pre-existing hypertension complicating pregnancy, first trimester: Secondary | ICD-10-CM | POA: Diagnosis present

## 2016-02-11 DIAGNOSIS — O30009 Twin pregnancy, unspecified number of placenta and unspecified number of amniotic sacs, unspecified trimester: Secondary | ICD-10-CM | POA: Insufficient documentation

## 2016-02-11 DIAGNOSIS — O99281 Endocrine, nutritional and metabolic diseases complicating pregnancy, first trimester: Secondary | ICD-10-CM | POA: Diagnosis not present

## 2016-02-11 DIAGNOSIS — E0590 Thyrotoxicosis, unspecified without thyrotoxic crisis or storm: Secondary | ICD-10-CM | POA: Diagnosis not present

## 2016-02-11 DIAGNOSIS — R0602 Shortness of breath: Secondary | ICD-10-CM

## 2016-02-11 DIAGNOSIS — R0600 Dyspnea, unspecified: Secondary | ICD-10-CM

## 2016-02-11 DIAGNOSIS — O99511 Diseases of the respiratory system complicating pregnancy, first trimester: Principal | ICD-10-CM | POA: Diagnosis present

## 2016-02-11 DIAGNOSIS — R0682 Tachypnea, not elsewhere classified: Secondary | ICD-10-CM

## 2016-02-11 DIAGNOSIS — O161 Unspecified maternal hypertension, first trimester: Secondary | ICD-10-CM

## 2016-02-11 DIAGNOSIS — O99211 Obesity complicating pregnancy, first trimester: Secondary | ICD-10-CM | POA: Diagnosis present

## 2016-02-11 DIAGNOSIS — E0500 Thyrotoxicosis with diffuse goiter without thyrotoxic crisis or storm: Secondary | ICD-10-CM | POA: Diagnosis present

## 2016-02-11 DIAGNOSIS — Z3689 Encounter for other specified antenatal screening: Secondary | ICD-10-CM

## 2016-02-11 HISTORY — DX: Pneumonia, unspecified organism: J18.9

## 2016-02-11 HISTORY — DX: Unspecified chronic bronchitis: J42

## 2016-02-11 LAB — URINALYSIS, ROUTINE W REFLEX MICROSCOPIC
GLUCOSE, UA: NEGATIVE mg/dL
Ketones, ur: 40 mg/dL — AB
Nitrite: NEGATIVE
Protein, ur: >300 mg/dL — AB
Specific Gravity, Urine: 1.025 (ref 1.005–1.030)
pH: 5 (ref 5.0–8.0)

## 2016-02-11 LAB — COMPREHENSIVE METABOLIC PANEL
ALK PHOS: 131 U/L — AB (ref 38–126)
ALT: 19 U/L (ref 14–54)
ANION GAP: 10 (ref 5–15)
AST: 23 U/L (ref 15–41)
Albumin: 2.6 g/dL — ABNORMAL LOW (ref 3.5–5.0)
BUN: 7 mg/dL (ref 6–20)
CALCIUM: 8.8 mg/dL — AB (ref 8.9–10.3)
CHLORIDE: 107 mmol/L (ref 101–111)
CO2: 20 mmol/L — AB (ref 22–32)
Creatinine, Ser: 0.41 mg/dL — ABNORMAL LOW (ref 0.44–1.00)
GFR calc non Af Amer: >60 mL/min (ref >60)
Glucose, Bld: 107 mg/dL — ABNORMAL HIGH (ref 65–99)
Potassium: 3.5 mmol/L (ref 3.5–5.1)
SODIUM: 137 mmol/L (ref 135–145)
Total Bilirubin: 1 mg/dL (ref 0.3–1.2)
Total Protein: 6.7 g/dL (ref 6.5–8.1)

## 2016-02-11 LAB — CBC WITH DIFFERENTIAL/PLATELET
BASOS ABS: 0 10*3/uL (ref 0.0–0.1)
BASOS PCT: 0 %
EOS ABS: 0 10*3/uL (ref 0.0–0.7)
Eosinophils Relative: 0 %
HCT: 31.7 % — ABNORMAL LOW (ref 36.0–46.0)
Hemoglobin: 10.4 g/dL — ABNORMAL LOW (ref 12.0–15.0)
LYMPHS PCT: 13 %
Lymphs Abs: 1.3 10*3/uL (ref 0.7–4.0)
MCH: 23 pg — AB (ref 26.0–34.0)
MCHC: 32.8 g/dL (ref 30.0–36.0)
MCV: 70.1 fL — ABNORMAL LOW (ref 78.0–100.0)
MONO ABS: 0.9 10*3/uL (ref 0.1–1.0)
Monocytes Relative: 9 %
NEUTROS PCT: 78 %
Neutro Abs: 7.6 10*3/uL (ref 1.7–7.7)
PLATELETS: 247 10*3/uL (ref 150–400)
RBC: 4.52 MIL/uL (ref 3.87–5.11)
RDW: 13.2 % (ref 11.5–15.5)
WBC: 9.8 10*3/uL (ref 4.0–10.5)

## 2016-02-11 LAB — I-STAT CG4 LACTIC ACID, ED
LACTIC ACID, VENOUS: 0.97 mmol/L (ref 0.5–2.0)
Lactic Acid, Venous: 0.8 mmol/L (ref 0.5–2.0)

## 2016-02-11 LAB — I-STAT TROPONIN, ED
TROPONIN I, POC: 0.01 ng/mL (ref 0.00–0.08)
Troponin i, poc: 0.01 ng/mL (ref 0.00–0.08)
Troponin i, poc: 0.01 ng/mL (ref 0.00–0.08)

## 2016-02-11 LAB — T4, FREE: FREE T4: 5.33 ng/dL — AB (ref 0.61–1.12)

## 2016-02-11 LAB — URINE MICROSCOPIC-ADD ON

## 2016-02-11 LAB — RAPID URINE DRUG SCREEN, HOSP PERFORMED
AMPHETAMINES: NOT DETECTED
BENZODIAZEPINES: NOT DETECTED
Barbiturates: NOT DETECTED
Cocaine: NOT DETECTED
Opiates: NOT DETECTED
TETRAHYDROCANNABINOL: NOT DETECTED

## 2016-02-11 LAB — TSH: TSH: 0.045 u[IU]/mL — AB (ref 0.350–4.500)

## 2016-02-11 LAB — STREP PNEUMONIAE URINARY ANTIGEN: Strep Pneumo Urinary Antigen: NEGATIVE

## 2016-02-11 LAB — BRAIN NATRIURETIC PEPTIDE: B NATRIURETIC PEPTIDE 5: 113.8 pg/mL — AB (ref 0.0–100.0)

## 2016-02-11 MED ORDER — ALBUTEROL SULFATE (2.5 MG/3ML) 0.083% IN NEBU
2.5000 mg | INHALATION_SOLUTION | Freq: Once | RESPIRATORY_TRACT | Status: AC
  Administered 2016-02-11: 2.5 mg via RESPIRATORY_TRACT
  Filled 2016-02-11: qty 3

## 2016-02-11 MED ORDER — DEXTROSE 5 % IV SOLN
1.0000 g | Freq: Once | INTRAVENOUS | Status: AC
  Administered 2016-02-11: 1 g via INTRAVENOUS
  Filled 2016-02-11: qty 10

## 2016-02-11 MED ORDER — SODIUM CHLORIDE 0.9 % IV BOLUS (SEPSIS)
1000.0000 mL | Freq: Once | INTRAVENOUS | Status: AC
  Administered 2016-02-11: 1000 mL via INTRAVENOUS

## 2016-02-11 MED ORDER — AZITHROMYCIN 250 MG PO TABS
500.0000 mg | ORAL_TABLET | Freq: Every day | ORAL | Status: DC
  Administered 2016-02-12 – 2016-02-15 (×4): 500 mg via ORAL
  Filled 2016-02-11 (×2): qty 1
  Filled 2016-02-11 (×2): qty 2

## 2016-02-11 MED ORDER — SODIUM CHLORIDE 0.9% FLUSH: 3.0000 mL | INTRAVENOUS | Status: DC | PRN

## 2016-02-11 MED ORDER — LABETALOL HCL 200 MG PO TABS
100.0000 mg | ORAL_TABLET | Freq: Two times a day (BID) | ORAL | Status: DC
  Administered 2016-02-11: 100 mg via ORAL
  Filled 2016-02-11: qty 1

## 2016-02-11 MED ORDER — DEXTROSE 5 % IV SOLN
1.0000 g | INTRAVENOUS | Status: AC
  Administered 2016-02-12 – 2016-02-17 (×6): 1 g via INTRAVENOUS
  Filled 2016-02-11 (×6): qty 10

## 2016-02-11 MED ORDER — DEXTROSE 5 % IV SOLN
500.0000 mg | Freq: Once | INTRAVENOUS | Status: AC
  Administered 2016-02-11: 500 mg via INTRAVENOUS
  Filled 2016-02-11: qty 500

## 2016-02-11 MED ORDER — SODIUM CHLORIDE 0.9% FLUSH
3.0000 mL | Freq: Two times a day (BID) | INTRAVENOUS | Status: DC
  Administered 2016-02-12 – 2016-02-18 (×9): 3 mL via INTRAVENOUS

## 2016-02-11 MED ORDER — METHIMAZOLE 5 MG PO TABS
5.0000 mg | ORAL_TABLET | Freq: Every day | ORAL | Status: DC
  Administered 2016-02-11: 5 mg via ORAL
  Filled 2016-02-11 (×2): qty 1

## 2016-02-11 MED ORDER — ENOXAPARIN SODIUM 40 MG/0.4ML ~~LOC~~ SOLN
40.0000 mg | SUBCUTANEOUS | Status: DC
  Administered 2016-02-11 – 2016-02-17 (×3): 40 mg via SUBCUTANEOUS
  Filled 2016-02-11 (×5): qty 0.4

## 2016-02-11 MED ORDER — COMPLETENATE 29-1 MG PO CHEW
1.0000 | CHEWABLE_TABLET | Freq: Every day | ORAL | Status: DC
  Filled 2016-02-11: qty 1

## 2016-02-11 NOTE — ED Notes (Signed)
Patient placed on 2L O2 

## 2016-02-11 NOTE — Progress Notes (Signed)
  Echocardiogram 2D Echocardiogram has been performed.  Cathie Beams 02/11/2016, 3:29 PM

## 2016-02-11 NOTE — ED Notes (Signed)
Pt transitioned to 4 L via nasal cannula to see if she still needs the NRB. O2 stable at this time. Will continue to monitor.

## 2016-02-11 NOTE — ED Provider Notes (Signed)
CSN: [id number]     Arrival date & time 02/11/16  0732 History   First MD Initiated Contact with Patient 02/11/16 [phone]     Chief Complaint  Patient presents with  . Shortness of Breath     (Consider location/radiation/quality/duration/timing/severity/associated sxs/prior Treatment) Patient is a 25 y.o. female presenting with shortness of breath.  Shortness of Breath Severity:  Severe Associated symptoms: chest pain (tightness, worse with deep breath) and cough (2wks, white phlegm)   Associated symptoms: no abdominal pain, no fever, no headaches, no neck pain, no rash, no sore throat, no syncope, no vomiting and no wheezing   Risk factors: no hx of PE/DVT   Risk factors comment:  Pregnancy   Past Medical History  Diagnosis Date  . Hypertension   . Hyperthyroidism   . Graves disease   . Obesity    Past Surgical History  Procedure Laterality Date  . Eye surgery     No family history on file. Social History  Substance Use Topics  . Smoking status: Never Smoker   . Smokeless tobacco: Never Used  . Alcohol Use: No   OB History    Gravida Para Term Preterm AB TAB SAB Ectopic Multiple Living   1              Review of Systems  Constitutional: Negative for fever.  HENT: Negative for sore throat.   Eyes: Negative for visual disturbance.  Respiratory: Positive for cough (2wks, white phlegm) and shortness of breath. Negative for wheezing.   Cardiovascular: Positive for chest pain (tightness, worse with deep breath). Negative for leg swelling and syncope.  Gastrointestinal: Negative for vomiting and abdominal pain.  Genitourinary: Negative for difficulty urinating.  Musculoskeletal: Negative for back pain and neck pain.  Skin: Negative for rash.  Neurological: Negative for syncope and headaches.      Allergies  Iodine and Shellfish allergy  Home Medications   Prior to Admission medications   Medication Sig Start Date End Date Taking? Authorizing Provider   methimazole (TAPAZOLE) 5 MG tablet Take 1 tablet (5 mg total) by mouth daily. 12/12/15  Yes Zadie Rhine, MD  Prenatal Vit-Fe Fumarate-FA (GOODSENSE PRENATAL VITAMINS) 28-0.8 MG TABS Take 1 tablet by mouth daily. 12/12/15   Zadie Rhine, MD   BP 154/59 mmHg  Pulse 117  Temp(Src) 98.9 F (37.2 C) (Oral)  Resp 49  Ht  (1.676 m)  Wt 267 lb 2 oz (121.167 kg)  BMI 43.14 kg/m2  SpO2 98%  LMP 11/03/2015 Physical Exam  Constitutional: She is oriented to person, place, and time. She appears well-developed and well-nourished. No distress.  HENT:  Head: Normocephalic and atraumatic.  Eyes: Conjunctivae and EOM are normal.  Neck: Normal range of motion.  Cardiovascular: Regular rhythm, normal heart sounds and intact distal pulses.  Tachycardia present.  Exam reveals no gallop and no friction rub.   No murmur heard. Pulmonary/Chest: Effort normal and breath sounds normal. Tachypnea noted. No respiratory distress. She has no wheezes. She has no rales.  Abdominal: Soft. She exhibits no distension. There is no tenderness. There is no guarding.  Musculoskeletal: She exhibits no edema or tenderness.  Neurological: She is alert and oriented to person, place, and time.  Skin: Skin is warm and dry. No rash noted. She is not diaphoretic. No erythema.  Nursing note and vitals reviewed.   ED Course  Procedures (including critical care time) Labs Review Labs Reviewed  CBC WITH DIFFERENTIAL/PLATELET - Abnormal; Notable for the following:  Hemoglobin 10.4 (*)    HCT 31.7 (*)    MCV 70.1 (*)    MCH 23.0 (*)    All other components within normal limits  COMPREHENSIVE METABOLIC PANEL - Abnormal; Notable for the following:    CO2 20 (*)    Glucose, Bld 107 (*)    Creatinine, Ser 0.41 (*)    Calcium 8.8 (*)    Albumin 2.6 (*)    Alkaline Phosphatase 131 (*)    All other components within normal limits  BRAIN NATRIURETIC PEPTIDE - Abnormal; Notable for the following:    B Natriuretic  Peptide 113.8 (*)    All other components within normal limits  URINALYSIS, ROUTINE W REFLEX MICROSCOPIC (NOT AT [facility]) - Abnormal; Notable for the following:    Color, Urine AMBER (*)    APPearance CLOUDY (*)    Hgb urine dipstick LARGE (*)    Bilirubin Urine MODERATE (*)    Ketones, ur 40 (*)    Protein, ur >300 (*)    Leukocytes, UA TRACE (*)    All other components within normal limits  URINE MICROSCOPIC-ADD ON - Abnormal; Notable for the following:    Squamous Epithelial / LPF 6-30 (*)    Bacteria, UA MANY (*)    Casts WAXY CAST (*)    All other components within normal limits  TSH - Abnormal; Notable for the following:    TSH 0.045 (*)    All other components within normal limits  T4, FREE - Abnormal; Notable for the following:    Free T4 5.33 (*)    All other components within normal limits  CULTURE, BLOOD (ROUTINE X 2)  CULTURE, BLOOD (ROUTINE X 2)  CULTURE, EXPECTORATED SPUTUM-ASSESSMENT  GRAM STAIN  MRSA PCR SCREENING  STREP PNEUMONIAE URINARY ANTIGEN  URINE RAPID DRUG SCREEN, HOSP PERFORMED  T3, FREE  THYROXINE BINDING GLOBULIN  LEGIONELLA ANTIGEN, URINE  INFLUENZA PANEL BY PCR (TYPE A & B, H1N1)  CBC WITH DIFFERENTIAL/PLATELET  I-STAT TROPOININ, ED  I-STAT TROPOININ, ED  I-STAT CG4 LACTIC ACID, ED  I-STAT TROPOININ, ED  I-STAT CG4 LACTIC ACID, ED    Imaging Review Dg Chest 2 View  02/11/2016  CLINICAL DATA:  25 year old female with nonproductive cough for 2 weeks. Chest tightness and shortness of breath since yesterday. Initial encounter. EXAM: CHEST  2 VIEW COMPARISON:  Portable chest 1220 hours today. FINDINGS: Bilateral lower lobe as well as right upper lobe confluent airspace opacity. Left lower lobe consolidation. No definite pleural effusion. The left upper lobe appears spared. Mediastinal contours remain normal. Visualized tracheal air column is within normal limits. Mild scoliotic curvature of the spine. Otherwise No osseous abnormality identified.  IMPRESSION: Appearance most suggestive of bilateral multifocal pneumonia. No pleural effusion at this time. Electronically Signed   By: Odessa Fleming M.D.   On: 02/11/2016 15:39   Dg Chest Portable 1 View  02/11/2016  CLINICAL DATA:  25 year old female with nonproductive cough and shortness of breath for 2 weeks. Chest pain since yesterday. Initial encounter. EXAM: PORTABLE CHEST 1 VIEW COMPARISON:  None. FINDINGS: Portable AP semi upright view at 1220 hours. Large body habitus and low lung volumes. Cardiomegaly versus accentuated cardiac size. Other mediastinal contours are within normal limits. Visualized tracheal air column is within normal limits. Nodular in confluent bibasilar pulmonary opacity. No definite pleural effusion. No pneumothorax or definite edema. IMPRESSION: 1. Low lung volumes with abnormal bibasilar pulmonary opacity, nonspecific but suspicious for bilateral pneumonia. 2. Possible cardiomegaly. 3. Recommend PA and  lateral views of the chest when possible. Electronically Signed   By: Odessa Fleming M.D.   On: 02/11/2016 12:30   I have personally reviewed and evaluated these images and lab results as part of my medical decision-making.   EKG Interpretation   Date/Time:  Wednesday February 11 2016 07:40:07 EST Ventricular Rate:  117 PR Interval:  136 QRS Duration: 96 QT Interval:  354 QTC Calculation: 493 R Axis:   31 Text Interpretation:  Sinus tachycardia Incomplete right bundle branch  block No significant change since last tracing Confirmed by [facility] MD,  Eziah Negro ([id number]) on 02/11/2016 9:58:21 AM      MDM   Final diagnoses:  Community acquired pneumonia  Tachypnea  Acute respiratory failure with hypoxia (HCC)   25 yo female with history of htn, hyperthyroidsim at [redacted]wk gestation presents with concern for cough for 2 weeks cough, shortness of breath, chest tightness. Patient initially with mild tachypnea however normal oxygen saturation on room air, however in the ED had worsening  tachypnea and hypoxia.  EKG similar to prior with sinus tachycardia and incomplete RBBB.  Troponin negative. BNP mild elevation.  Chest XR shows bilateral opacities. Ordered blood cx, rocephin/azithromycin for CAP.  Pt maintaining sats on nonrebreather. No abd pain/vaginal bleeding, FHR 150.  Pt to be admitted to stepdown for further evaluation and treatment of hypoxia, tachypnea, tachycardia, likely secondary to CAP however will continue evaluation for other etiologies. Patient discharged in stable condition with understanding of reasons to return.    Alvira Monday, MD 02/11/16 [phone]

## 2016-02-11 NOTE — H&P (Signed)
Date: 02/11/2016               Patient Name:  Tara [name] MRN: [id number]  DOB: Sep 08, 1991 Age / Sex: 25 y.o., female   PCP: No Pcp Per Patient         Medical Service: Internal Medicine Teaching Service         Attending Physician: Dr. Doneen Poisson, MD    First Contact: Dr. Darreld Mclean Pager: [phone]  Second Contact: Dr. Jill Alexanders Pager: [phone]       After Hours (After 5p/  First Contact Pager: [phone]  weekends / holidays): Second Contact Pager: [phone]   Chief Complaint: DOE, SOB  History of Present Illness:   [name] is a 25 year old lady with PMH of Graves Disease, Thyroid Storm, and HTN who presents with dyspnea. Patient is [id number] and currently [redacted] weeks pregnant. Patient reports 2 weeks history of dry cough. 2 days ago she began to have SOB and DOE. She says her breathing was worse with activity and laying flat. Her cough and breathing were improved when sitting upright. She also reports chest tightness, described as pressure-like sensation, located at her left upper chest wall above her breast. She denies any radiation to her arms, neck, jaw or back. She also complains of associated dizziness described as room-spinning sensation. She denies any falls.  Patient reports similar episodes in the past for which she needed hospitalization and intubation 4 different times. 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 She was hospitalized twice last year for pneumonia (March and August) and fluid in the lungs. She says she recovered after each hospitalization without further SOB, DOE between episodes. This is her 4th pregnancy with two health children. She had one miscarriage. She denies any personal or family history of blood clots. She is not taking prenatal vitamins as they make her sick. She denies any vaginal bleeding/discharge or abdominal cramping.  For her Graves disease, she takes methimazole 5 mg. She admits to occasional missed doses,  maybe 2x a week. She has not seen an endocrinologist in over a year (previously followed at [facility]).  On arrival she was hypoxic at 88% on room air, placed on [place] and was hypoxic in the 80s on 6L. Then placed on NRB, saturating 100%.  ED Vitals:  BP 154/114 mmHg  Pulse 113  Temp(Src) 97.7 F (36.5 C) (Oral)  Resp 24  Ht 5\' 6"  (1.676 m)  Wt 267 lb 2 oz (121.167 kg)  BMI 43.14 kg/m2  SpO2 96%  LMP 11/03/2015    Meds: Current Facility-Administered Medications  Medication Dose Route Frequency Provider Last Rate Last Dose  . [START ON 02/12/2016] azithromycin (ZITHROMAX) tablet 500 mg  500 mg Oral Daily Yolanda Manges, DO      . [START ON 02/12/2016] cefTRIAXone (ROCEPHIN) 1 g in dextrose 5 % 50 mL IVPB  1 g Intravenous Q24H Emi Holes, RPH      . enoxaparin (LOVENOX) injection 40 mg  40 mg Subcutaneous Q24H Yolanda Manges, DO      . labetalol (NORMODYNE) tablet 100 mg  100 mg Oral BID Yolanda Manges, DO      . methimazole (TAPAZOLE) tablet 5 mg  5 mg Oral Daily Yolanda Manges, DO      . [START ON 02/12/2016] prenatal vitamin w/FE, FA (NATACHEW) chewable tablet 1 tablet  1 tablet Oral Q1200 Yolanda Manges, DO      . sodium chloride  flush (NS) 0.9 % injection 3 mL  3 mL Intravenous Q12H Yolanda Manges, DO      . sodium chloride flush (NS) 0.9 % injection 3 mL  3 mL Intravenous PRN Yolanda Manges, DO        Allergies: Allergies as of 02/11/2016 - Review Complete 02/11/2016  Allergen Reaction Noted  . Iodine Hives 12/01/2015  . Shellfish allergy  02/11/2016   Past Medical History  Diagnosis Date  . Hypertension   . Hyperthyroidism   . Graves disease   . Obesity    Past Surgical History  Procedure Laterality Date  . Eye surgery     No family history on file. Social History   Social History  . Marital Status: Single    Spouse Name: N/A  . Number of Children: N/A  . Years of Education: N/A   Occupational History  . Not on file.   Social History Main Topics  . Smoking status:  Never Smoker   . Smokeless tobacco: Never Used  . Alcohol Use: No  . Drug Use: No  . Sexual Activity: Not on file   Other Topics Concern  . Not on file   Social History Narrative    Review of Systems: Review of Systems  Constitutional: Negative for fever, chills and diaphoresis.  HENT: Negative for congestion and sore throat.        Clear rhinorrhea, neck swelling  Respiratory: Positive for cough and shortness of breath. Negative for hemoptysis, sputum production and wheezing.   Cardiovascular: Positive for chest pain, palpitations and orthopnea. Negative for leg swelling and PND.  Gastrointestinal: Negative for nausea, abdominal pain, diarrhea and constipation.       One episode clear emesis  Genitourinary: Negative for dysuria.  Musculoskeletal: Negative for myalgias, joint pain and falls.  Skin: Negative for rash.  Neurological: Positive for dizziness. Negative for tremors, focal weakness, loss of consciousness and headaches.  Psychiatric/Behavioral: Negative for substance abuse. The patient is not nervous/anxious.      Physical Exam: Blood pressure 154/59, pulse 117, temperature 98.9 F (37.2 C), temperature source Oral, resp. rate 49, height 5\' 6"  (1.676 m), weight 267 lb 2 oz (121.167 kg), last menstrual period 11/03/2015, SpO2 98 %. Physical Exam  Constitutional: She is oriented to person, place, and time. She appears well-developed and well-nourished.  Obese woman, laying in bed on non-rebreather  HENT:  Head: Normocephalic and atraumatic.  Mouth/Throat: Oropharynx is clear and moist. No oropharyngeal exudate.  Eyes: EOM are normal. Pupils are equal, round, and reactive to light. No scleral icterus.  Exophthalmos  Neck: Normal range of motion. Neck supple.  Goiter, non-tender. No appreciable JVD.  Cardiovascular:  Tachycardic, regular rhythm, soft systolic murmur heard at LUS border  Pulmonary/Chest: She exhibits tenderness.  Decreased air movement, LLL crackles,  no wheezing or rhonchi  Abdominal: Soft. Bowel sounds are normal. She exhibits no distension. There is no tenderness.  Musculoskeletal: Normal range of motion. She exhibits no tenderness.  Trace non-pitting edema b/l lower extremities  Lymphadenopathy:    She has no cervical adenopathy.  Neurological: She is alert and oriented to person, place, and time.  Skin: Skin is warm. No rash noted. She is diaphoretic.  Psychiatric: She has a normal mood and affect.     Lab results: Basic Metabolic Panel:  Recent Labs  40/98/11 0942  NA 137  K 3.5  CL 107  CO2 20*  GLUCOSE 107*  BUN 7  CREATININE 0.41*  CALCIUM 8.8*  Liver Function Tests:  Recent Labs  02/11/16 0942  AST 23  ALT 19  ALKPHOS 131*  BILITOT 1.0  PROT 6.7  ALBUMIN 2.6*   No results for input(s): LIPASE, AMYLASE in the last 72 hours. No results for input(s): AMMONIA in the last 72 hours. CBC:  Recent Labs  02/11/16 0942  WBC 9.8  NEUTROABS 7.6  HGB 10.4*  HCT 31.7*  MCV 70.1*  PLT 247   Cardiac Enzymes: No results for input(s): CKTOTAL, CKMB, CKMBINDEX, TROPONINI in the last 72 hours. BNP: No results for input(s): PROBNP in the last 72 hours. D-Dimer: No results for input(s): DDIMER in the last 72 hours. CBG: No results for input(s): GLUCAP in the last 72 hours. Hemoglobin A1C: No results for input(s): HGBA1C in the last 72 hours. Fasting Lipid Panel: No results for input(s): CHOL, HDL, LDLCALC, TRIG, CHOLHDL, LDLDIRECT in the last 72 hours. Thyroid Function Tests:  Recent Labs  02/11/16 1640  TSH 0.045*  FREET4 5.33*   Anemia Panel: No results for input(s): VITAMINB12, FOLATE, FERRITIN, TIBC, IRON, RETICCTPCT in the last 72 hours. Coagulation: No results for input(s): LABPROT, INR in the last 72 hours. Urine Drug Screen: Drugs of Abuse     Component Value Date/Time   LABOPIA NONE DETECTED 02/11/2016 0942   COCAINSCRNUR NONE DETECTED 02/11/2016 0942   LABBENZ NONE DETECTED  02/11/2016 0942   AMPHETMU NONE DETECTED 02/11/2016 0942   THCU NONE DETECTED 02/11/2016 0942   LABBARB NONE DETECTED 02/11/2016 0942    Alcohol Level: No results for input(s): ETH in the last 72 hours. Urinalysis:  Recent Labs  02/11/16 0942  COLORURINE AMBER*  LABSPEC 1.025  PHURINE 5.0  GLUCOSEU NEGATIVE  HGBUR LARGE*  BILIRUBINUR MODERATE*  KETONESUR 40*  PROTEINUR >300*  NITRITE NEGATIVE  LEUKOCYTESUR TRACE*    Imaging results:  Dg Chest 2 View  02/11/2016  CLINICAL DATA:  25 year old female with nonproductive cough for 2 weeks. Chest tightness and shortness of breath since yesterday. Initial encounter. EXAM: CHEST  2 VIEW COMPARISON:  Portable chest 1220 hours today. FINDINGS: Bilateral lower lobe as well as right upper lobe confluent airspace opacity. Left lower lobe consolidation. No definite pleural effusion. The left upper lobe appears spared. Mediastinal contours remain normal. Visualized tracheal air column is within normal limits. Mild scoliotic curvature of the spine. Otherwise No osseous abnormality identified. IMPRESSION: Appearance most suggestive of bilateral multifocal pneumonia. No pleural effusion at this time. Electronically Signed   By: Odessa Fleming M.D.   On: 02/11/2016 15:39   Dg Chest Portable 1 View  02/11/2016  CLINICAL DATA:  25 year old female with nonproductive cough and shortness of breath for 2 weeks. Chest pain since yesterday. Initial encounter. EXAM: PORTABLE CHEST 1 VIEW COMPARISON:  None. FINDINGS: Portable AP semi upright view at 1220 hours. Large body habitus and low lung volumes. Cardiomegaly versus accentuated cardiac size. Other mediastinal contours are within normal limits. Visualized tracheal air column is within normal limits. Nodular in confluent bibasilar pulmonary opacity. No definite pleural effusion. No pneumothorax or definite edema. IMPRESSION: 1. Low lung volumes with abnormal bibasilar pulmonary opacity, nonspecific but suspicious for  bilateral pneumonia. 2. Possible cardiomegaly. 3. Recommend PA and lateral views of the chest when possible. Electronically Signed   By: Odessa Fleming M.D.   On: 02/11/2016 12:30    Other results: EKG: sinus tach, incomplete RBBB.  Assessment & Plan by Problem: Principal Problem:   CAP (community acquired pneumonia) Active Problems:   Graves disease  Hypertension   Acute respiratory failure with hypoxia (HCC)  Acute respiratory failure with hypoxia: Patient with reported history of multiple episodes of pneumonia and need for intubation due to respiratory failure. She was hypoxic on arrival, satting in the 80s on 6L requiring NRB. CXR with bilateral airspace opacities suggestive of multifocal pneumonia. On exam, she has crackles on the left lower lobe with decreased air movement. Her symptoms have improved on the NRB and she was able to lay flat and converse well. She is afebrile without leukocytosis. She likely has a community acquired pneumonia and we will treat her as such with antibiotics. However, with her hypoxia, tachycardia, and pregnancy status, she is at risk for VTE. Lower extremity dopplers were negative for DVT, but this does not rule out PE. If she worsens or does not improve with current management, will need to consider V/Q scan. Heart failure is another concern in setting of thyroid disease and enlarged cardiac size on CXR. TTE was done with EF of 45-50%. 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 Patient on Methimazole 5 mg po daily with occasional missed doses weekly. Her free T4 is 5.33 and TSH 0.045. In pregnancy, TSH range is 0.1-2.5 in 1st trimester and 0.2-3.0 in 2nd semester. Patient is in overt hyperthyroidism, which we will continue to  treat. Recommendations are for PTU for 1st trimester with switch to methimazole beginning in the 2nd trimester. Patient has unfortunately been on Methimazole for entire length of current pregnancy. As she is now in 2nd trimester, we will continue methimazole at current dose of 5 mg. This might need to be increased as initial dosing recommendation is for 10-40 mg with lowest possible dose needed to maintain free T4 levels slightly above or in high-normal range. -Continue Methimazole 5 mg po daily -f/u free T3, TBG -will need to monitor serum free T4 every 2-4 weeks  HTN: Previously on Metoprolol 50 mg BID, which she says was discontinued when she was found to be pregnant. She is hypertensive in 140-150s systolic and tachycardic at 110-120s. -Start Labetolol 100 mg po BID -Cardiac monitoring  Diet: Regular  DVT ppx: Lovenox   Code: FULL    Dispo: Disposition is deferred at this time, awaiting improvement of current medical problems. Anticipated discharge in approximately 2-4 day(s).   The patient does not have a current PCP (No Pcp Per Patient) and does need an [facility] hospital follow-up appointment after discharge.  The patient does not have transportation limitations that hinder transportation to clinic appointments.  Signed: Darreld Mclean, MD 02/11/2016, 8:16 PM

## 2016-02-11 NOTE — ED Notes (Signed)
pts O2 remains in the 80s on 6 L. EDP at bedside and states to place her on a NRB. O2 now 100% on NRB.

## 2016-02-11 NOTE — Progress Notes (Signed)
*  Preliminary Results* Bilateral lower extremity venous duplex completed. Study was technically difficult due to patient body habitus and patient position. Visualized veins of bilateral lower extremities are negative for deep vein thrombosis. There is no evidence of Baker's cyst bilaterally.  02/11/2016  Gertie Fey, RVT, RDCS, RDMS

## 2016-02-11 NOTE — ED Notes (Signed)
Pt. Reports having a non-productive cough for 2 weeks.  She developed chest tightness and sob yesterday.  Her chest tightness increases with exertion as does the sob.  She is [redacted] weeks pregnant, Gravada 3 para 2. Pt. Denies any cramping or vaginal bleeding. Pt. Denies any fevers or chills.  She did have dizziness yesterday ECG completed in Triage

## 2016-02-11 NOTE — ED Notes (Signed)
Pts O2 dropped to 88% on RA. EDP made aware and patient placed on 2 L of O2 via nasal cannula.

## 2016-02-12 ENCOUNTER — Encounter: Payer: Medicaid Other | Admitting: Family

## 2016-02-12 ENCOUNTER — Inpatient Hospital Stay (HOSPITAL_COMMUNITY): Payer: Medicaid Other

## 2016-02-12 DIAGNOSIS — O99211 Obesity complicating pregnancy, first trimester: Secondary | ICD-10-CM | POA: Diagnosis present

## 2016-02-12 DIAGNOSIS — O99281 Endocrine, nutritional and metabolic diseases complicating pregnancy, first trimester: Secondary | ICD-10-CM | POA: Diagnosis present

## 2016-02-12 DIAGNOSIS — R0682 Tachypnea, not elsewhere classified: Secondary | ICD-10-CM | POA: Diagnosis not present

## 2016-02-12 DIAGNOSIS — Z91013 Allergy to seafood: Secondary | ICD-10-CM | POA: Diagnosis not present

## 2016-02-12 DIAGNOSIS — R0602 Shortness of breath: Secondary | ICD-10-CM | POA: Diagnosis present

## 2016-02-12 DIAGNOSIS — Z6841 Body Mass Index (BMI) 40.0 and over, adult: Secondary | ICD-10-CM | POA: Diagnosis not present

## 2016-02-12 DIAGNOSIS — J189 Pneumonia, unspecified organism: Secondary | ICD-10-CM | POA: Diagnosis present

## 2016-02-12 DIAGNOSIS — Z3A13 13 weeks gestation of pregnancy: Secondary | ICD-10-CM | POA: Diagnosis not present

## 2016-02-12 DIAGNOSIS — O99511 Diseases of the respiratory system complicating pregnancy, first trimester: Secondary | ICD-10-CM | POA: Diagnosis present

## 2016-02-12 DIAGNOSIS — O161 Unspecified maternal hypertension, first trimester: Secondary | ICD-10-CM | POA: Diagnosis not present

## 2016-02-12 DIAGNOSIS — Z3689 Encounter for other specified antenatal screening: Secondary | ICD-10-CM | POA: Insufficient documentation

## 2016-02-12 DIAGNOSIS — O30009 Twin pregnancy, unspecified number of placenta and unspecified number of amniotic sacs, unspecified trimester: Secondary | ICD-10-CM | POA: Insufficient documentation

## 2016-02-12 DIAGNOSIS — E0590 Thyrotoxicosis, unspecified without thyrotoxic crisis or storm: Secondary | ICD-10-CM | POA: Diagnosis not present

## 2016-02-12 DIAGNOSIS — J9601 Acute respiratory failure with hypoxia: Secondary | ICD-10-CM | POA: Diagnosis present

## 2016-02-12 DIAGNOSIS — O10911 Unspecified pre-existing hypertension complicating pregnancy, first trimester: Secondary | ICD-10-CM | POA: Diagnosis present

## 2016-02-12 DIAGNOSIS — J9621 Acute and chronic respiratory failure with hypoxia: Secondary | ICD-10-CM | POA: Diagnosis not present

## 2016-02-12 DIAGNOSIS — E0500 Thyrotoxicosis with diffuse goiter without thyrotoxic crisis or storm: Secondary | ICD-10-CM | POA: Diagnosis present

## 2016-02-12 LAB — INFLUENZA PANEL BY PCR (TYPE A & B)
H1N1 flu by pcr: NOT DETECTED
INFLAPCR: NEGATIVE
INFLBPCR: NEGATIVE

## 2016-02-12 LAB — CBC WITH DIFFERENTIAL/PLATELET
Basophils Absolute: 0 10*3/uL (ref 0.0–0.1)
Basophils Relative: 0 %
EOS ABS: 0.1 10*3/uL (ref 0.0–0.7)
EOS PCT: 1 %
HCT: 29.3 % — ABNORMAL LOW (ref 36.0–46.0)
Hemoglobin: 9.2 g/dL — ABNORMAL LOW (ref 12.0–15.0)
LYMPHS ABS: 1.7 10*3/uL (ref 0.7–4.0)
Lymphocytes Relative: 21 %
MCH: 21.9 pg — AB (ref 26.0–34.0)
MCHC: 31.4 g/dL (ref 30.0–36.0)
MCV: 69.6 fL — AB (ref 78.0–100.0)
MONO ABS: 0.9 10*3/uL (ref 0.1–1.0)
Monocytes Relative: 11 %
NEUTROS PCT: 67 %
Neutro Abs: 5.3 10*3/uL (ref 1.7–7.7)
PLATELETS: 270 10*3/uL (ref 150–400)
RBC: 4.21 MIL/uL (ref 3.87–5.11)
RDW: 13.2 % (ref 11.5–15.5)
WBC: 8 10*3/uL (ref 4.0–10.5)

## 2016-02-12 LAB — HCG, QUANTITATIVE, PREGNANCY: hCG, Beta Chain, Quant, S: 121846 m[IU]/mL — ABNORMAL HIGH (ref <5)

## 2016-02-12 LAB — LEGIONELLA ANTIGEN, URINE

## 2016-02-12 LAB — MAGNESIUM: Magnesium: 2.1 mg/dL (ref 1.7–2.4)

## 2016-02-12 LAB — MRSA PCR SCREENING: MRSA BY PCR: NEGATIVE

## 2016-02-12 LAB — T3, FREE: T3, Free: 17.7 pg/mL — ABNORMAL HIGH (ref 2.0–4.4)

## 2016-02-12 MED ORDER — WHITE PETROLATUM GEL
Status: AC
  Administered 2016-02-12: 0.2
  Filled 2016-02-12: qty 1

## 2016-02-12 MED ORDER — LABETALOL HCL 100 MG PO TABS
200.0000 mg | ORAL_TABLET | Freq: Two times a day (BID) | ORAL | Status: DC
  Administered 2016-02-12 – 2016-02-14 (×5): 200 mg via ORAL
  Filled 2016-02-12: qty 2
  Filled 2016-02-12: qty 1
  Filled 2016-02-12: qty 2
  Filled 2016-02-12 (×2): qty 1

## 2016-02-12 MED ORDER — METHIMAZOLE 5 MG PO TABS
5.0000 mg | ORAL_TABLET | Freq: Two times a day (BID) | ORAL | Status: DC
  Administered 2016-02-12 – 2016-02-18 (×13): 5 mg via ORAL
  Filled 2016-02-12 (×14): qty 1

## 2016-02-12 MED ORDER — CETYLPYRIDINIUM CHLORIDE 0.05 % MT LIQD
7.0000 mL | Freq: Two times a day (BID) | OROMUCOSAL | Status: DC
  Administered 2016-02-12 – 2016-02-18 (×12): 7 mL via OROMUCOSAL

## 2016-02-12 MED ORDER — PRENATAL MULTIVITAMIN CH
1.0000 | ORAL_TABLET | Freq: Every day | ORAL | Status: DC
  Administered 2016-02-12 – 2016-02-18 (×7): 1 via ORAL
  Filled 2016-02-12 (×8): qty 1

## 2016-02-12 MED ORDER — ACETAMINOPHEN 325 MG PO TABS
650.0000 mg | ORAL_TABLET | Freq: Four times a day (QID) | ORAL | Status: DC | PRN
  Administered 2016-02-12 – 2016-02-16 (×5): 650 mg via ORAL
  Filled 2016-02-12 (×5): qty 2

## 2016-02-12 NOTE — Progress Notes (Signed)
Spoke with OB rapid response nurse from [facility] to ensure that appropriate process for monitoring pregnant mothers while inpatient and not at [facility] hospital was occuring. Also, spoke with IMTS MD patient is only [redacted] weeks pregnant and no other monitoring needs to occur at this time.

## 2016-02-12 NOTE — Progress Notes (Signed)
Subjective: Patient reports some improvement in breathing. She continues to have dyspnea with limited activity and left chest wall pain when she coughs. She reports worsening of her breathing when attempts were made to transition to nasal cannula.  Objective: Vital signs in last 24 hours: Filed Vitals:   02/12/16 0011 02/12/16 0350 02/12/16 0956 02/12/16 1243  BP: 125/68 142/76 161/53 107/72  Pulse:   106 101  Temp: 99.3 F (37.4 C) 100 F (37.8 C) 98.2 F (36.8 C) 98.3 F (36.8 C)  TempSrc: Axillary Axillary Oral Oral  Resp: 32 23 47 24  Height:      Weight:  272 lb 4.3 oz (123.5 kg)    SpO2: 100% 97%  100%   Weight change:   Intake/Output Summary (Last 24 hours) at 02/12/16 1457 Last data filed at 02/12/16 1004  Gross per 24 hour  Intake    703 ml  Output      0 ml  Net    703 ml   General: resting in bed, wearing venturi mask  Cardiac: RRR, no rubs, murmurs or gallops Pulm: bronchial breath sounds left lung field, bibasilar crackles, decreased air movement, no wheezing appreciated Abd: soft, nontender, nondistended, BS present Ext: warm and well perfused, no pedal edema Neuro: alert and oriented X3  Assessment/Plan: Principal Problem:   CAP (community acquired pneumonia) Active Problems:   Graves disease   Hypertension   Acute respiratory failure with hypoxia (HCC)   Community acquired pneumonia   Morbid obesity due to excess calories (HCC)  Tara [name] is a [id number] 25 year old lady with PMH of Graves Disease, Thyroid Storm, and HTN who presents with SOB, DOE, and dry cough. She is pregnant at about 13-14 weeks. She has multiple hospital admissions for PNA in the past reportedly requiring intubation 4 times. She also had an episode of thyroid storm in 2012.  Multi-focal Pneumonia: CXR with bilateral airspace opacities suggestive of multifocal pneumonia. She is requiring Ventimask when seen today and says that she was unable to tolerate nasal cannula when  attempted. She has continued bibasilar crackles and bronchial breath sounds in the left lung field. Urine strep pneumo, legionella, and influenza are negative. -Continue IV Azithromycin and Ceftriaxone -Continue respiratory support -f/u blood, sputum cultures   Graves disease: Patient has been taking Methimazole 5 mg po daily with occasional missed doses (about 2x/week). Her free T4 is 5.33 and TSH 0.045 with free T317.7. Patient has overt hyperthyroidism, which we will continue to treat.  -Increase Methimazole to 5 mg po BID -f/u TBG -will need to monitor serum free T4 every 2-4 weeks  HTN: Previously on Metoprolol 50 mg BID, which she says was discontinued when she was found to be pregnant. She is hypertensive in 140-150s systolic and tachycardic at 110-120s. -Increase Labetolol to 200 mg po BID -Cardiac monitoring   Pregnancy: Patient with U/S in December showing twin gestation. Repeat U/S today shows findings consistent with twin gestation with a single live intrauterine gestation estimated at 12 weeks 2 days with a crown-rump length of 57.1 mm. A fetal pole is seen within the second gestational sac with length of 14.4 mm and no cardiac activity. I discussed the case with OB/GYN Dr. Erin Fulling regarding if any changes in management would be warranted based on these findings. She has advised that at this early stage of pregnancy, we should continue to treat as a single gestation pregnancy and does not anticipate complications from this. -Continue prenatal vitamins -Pregnancy safe  medications only   Dispo: Disposition is deferred at this time, awaiting improvement of current medical problems.  Anticipated discharge in approximately 3-5 day(s).      LOS: 0 days   Darreld Mclean, MD 02/12/2016, 2:57 PM

## 2016-02-12 NOTE — Progress Notes (Signed)
Utilization Review Completed.  

## 2016-02-13 ENCOUNTER — Encounter (HOSPITAL_COMMUNITY): Payer: Self-pay | Admitting: General Practice

## 2016-02-13 DIAGNOSIS — J189 Pneumonia, unspecified organism: Secondary | ICD-10-CM

## 2016-02-13 DIAGNOSIS — Z3490 Encounter for supervision of normal pregnancy, unspecified, unspecified trimester: Secondary | ICD-10-CM | POA: Insufficient documentation

## 2016-02-13 HISTORY — DX: Pneumonia, unspecified organism: J18.9

## 2016-02-13 LAB — BASIC METABOLIC PANEL
Anion gap: 7 (ref 5–15)
BUN: 7 mg/dL (ref 6–20)
CHLORIDE: 106 mmol/L (ref 101–111)
CO2: 22 mmol/L (ref 22–32)
Calcium: 8.7 mg/dL — ABNORMAL LOW (ref 8.9–10.3)
Creatinine, Ser: 0.41 mg/dL — ABNORMAL LOW (ref 0.44–1.00)
GFR calc Af Amer: >60 mL/min (ref >60)
GLUCOSE: 106 mg/dL — AB (ref 65–99)
POTASSIUM: 3.8 mmol/L (ref 3.5–5.1)
Sodium: 135 mmol/L (ref 135–145)

## 2016-02-13 LAB — CBC
HEMATOCRIT: 29.4 % — AB (ref 36.0–46.0)
Hemoglobin: 9.6 g/dL — ABNORMAL LOW (ref 12.0–15.0)
MCH: 23 pg — ABNORMAL LOW (ref 26.0–34.0)
MCHC: 32.7 g/dL (ref 30.0–36.0)
MCV: 70.3 fL — AB (ref 78.0–100.0)
Platelets: 261 10*3/uL (ref 150–400)
RBC: 4.18 MIL/uL (ref 3.87–5.11)
RDW: 13.2 % (ref 11.5–15.5)
WBC: 9.7 10*3/uL (ref 4.0–10.5)

## 2016-02-13 NOTE — Progress Notes (Signed)
Subjective: Patient feels her breathing is improving. She is tolerating [place]. Her main complaint is continued cough with clear sputum production. She has increased cough when laying flat and moving about. She had a fever of 100.9 last night which resolved with Tylenol. Objective: Vital signs in last 24 hours: Filed Vitals:   02/13/16 0400 02/13/16 0405 02/13/16 0819 02/13/16 1153  BP:  139/82 145/79 129/62  Pulse:   110 100  Temp: 99.4 F (37.4 C)  98.2 F (36.8 C)   TempSrc: Oral  Oral   Resp: 23  26 38  Height:      Weight:  268 lb 15.4 oz (122 kg)    SpO2: 97%  100% 100%   Weight change: 1 lb 13.4 oz (0.833 kg)  Intake/Output Summary (Last 24 hours) at 02/13/16 1312 Last data filed at 02/13/16 0400  Gross per 24 hour  Intake    720 ml  Output      0 ml  Net    720 ml   General: sitting up in bed, wearing nasal cannula Cardiac: tachy, no rubs, murmurs or gallops Pulm: mild bibasilar crackles, decreased air movement, no wheezing appreciated Abd: soft, nontender, nondistended, BS present Ext: warm and well perfused, no pedal edema Neuro: alert and oriented X3, blunt affect  Assessment/Plan: Principal Problem:   CAP (community acquired pneumonia) Active Problems:   Graves disease   Hypertension   Acute respiratory failure with hypoxia (HCC)   Community acquired pneumonia   Morbid obesity due to excess calories (HCC)   Ultrasound to check fetal anatomy and growth in twin pregnancy, antepartum  [name] is a [id number] 25 year old lady with PMH of Graves Disease, Thyroid Storm, and HTN who presents with SOB, DOE, and dry cough. She is pregnant at about 13-14 weeks. She has multiple hospital admissions for PNA in the past reportedly requiring intubation 4 times. She also had an episode of thyroid storm in 2012.  Multi-focal Pneumonia: CXR with bilateral airspace opacities suggestive of multifocal pneumonia. She is now tolerating nasal cannula, oxygenating >95% on  4L. Urine strep pneumo, legionella, and influenza are negative. Patient reports her chest tightness is improved, but has continued cough which is bothersome. This is likely secondary to her acute infection, but if no improvement after several days of treatment will need to consider structural compression from her goiter as possible cause. -Continue IV Azithromycin and Ceftriaxone (Started 2/22) -Continue respiratory support with oxygen -f/u blood, sputum cultures -Patient stable for transfer to med-surg   Graves disease: Free T4 is 5.33 and TSH 0.045 with free T317.7. Patient with overt hyperthyroidism, which we will continue to treat. She has history of thyroid storm in 2012 and certainly at risk for storm with current infection, pregnancy status, and missed medication doses at home.  -Continue Methimazole 5 mg po BID -f/u TBG -will need to monitor serum free T4 every 2-4 weeks -Monitor for fever, mental status, symptoms of thyroid storm  HTN: SBP 120-140s, HR low 100s, better controlled on current Labetalol dosing. -Continue Labetolol 200 mg po BID -Cardiac monitoring  Pregnancy: [redacted] weeks gestation by LMP, ~12 weeks by U/S yesterday. Initially twin gestation, now single pregnancy. Discussed case with OB/Gyn, recommend continued treatment as single pregnancy, no changes in management. Patient with microcytic anemia, which is common in pregnancy. She is receiving prenatal vitamins which supplements iron. -Continue prenatal vitamins -Pregnancy safe medications only   Dispo: Disposition is deferred at this time, awaiting improvement of current  medical problems.  Anticipated discharge in approximately 2-3 day(s).      LOS: 1 day   Tara Mclean, MD 02/13/2016, 1:12 PM

## 2016-02-13 NOTE — Progress Notes (Signed)
In patient's room to check on her and she voices concern about her pregnancy. States that she feels miserable and if she doesn't start feeling better, would she be able to terminate the pregnancy to get better? Advised her that they are already giving her antibiotics and I doubt they would do anything different if she wasn't pregnant. Voiced understanding. Passed information on to [employer], RN on 6N. Noe Gens, RN

## 2016-02-13 NOTE — Progress Notes (Signed)
Report given to Leotis Shames, RN for 6N. Patient will be transferred via wheelchair. Belongings sent with patient include cell phone, charger and clothing. CCMD notified of transfer.  Noe Gens, RN

## 2016-02-13 NOTE — Care Management Note (Signed)
Case Management Note  Patient Details  Name: Tara [name] MRN: [id number] Date of Birth: [birth year]  Subjective/Objective:    Received referral to assist pt with choosing PCP.  Provided pt with list of internal medicine physicians in [facility], pt will review list and choose one.                         Expected Discharge Plan:  Home/Self Care   Discharge planning Services  CM Consult  Status of Service:  In process, will continue to follow  Magdalene River, RN 02/13/2016, 1:55 PM

## 2016-02-13 NOTE — Progress Notes (Signed)
Attempted report. Gave number to be called back on.

## 2016-02-14 MED ORDER — LABETALOL HCL 100 MG PO TABS
300.0000 mg | ORAL_TABLET | Freq: Two times a day (BID) | ORAL | Status: DC
  Administered 2016-02-14 – 2016-02-18 (×8): 300 mg via ORAL
  Filled 2016-02-14 (×9): qty 3

## 2016-02-14 MED ORDER — LABETALOL HCL 100 MG PO TABS: 100.0000 mg | ORAL_TABLET | Freq: Once | ORAL | Status: DC

## 2016-02-14 MED ORDER — DEXTROMETHORPHAN POLISTIREX ER 30 MG/5ML PO SUER: 30.0000 mg | Freq: Four times a day (QID) | ORAL | Status: DC

## 2016-02-14 MED ORDER — DEXTROMETHORPHAN POLISTIREX ER 30 MG/5ML PO SUER
60.0000 mg | Freq: Two times a day (BID) | ORAL | Status: DC
  Administered 2016-02-14 – 2016-02-18 (×9): 60 mg via ORAL
  Filled 2016-02-14 (×12): qty 10

## 2016-02-14 NOTE — Progress Notes (Signed)
Talking with Pharmacy about a cough med that would be safe for pregnancy.  Pt has continual dry cough.

## 2016-02-14 NOTE — Progress Notes (Signed)
Subjective:  Patient was seen and examined this morning. She feels her shortness of breath continues to improve, but she still feels more short of breath than her baseline. She mainly complains of her cough. She denies fever, chills, nausea or vomiting.   Objective: Vital signs in last 24 hours: Filed Vitals:   02/13/16 2207 02/14/16 0124 02/14/16 0444 02/14/16 0500  BP: 123/71 122/52  132/67  Pulse: 119 110  112  Temp: 98.5 F (36.9 C) 97.8 F (36.6 C)  98.4 F (36.9 C)  TempSrc: Oral Oral  Oral  Resp: Height:      Weight:   270 lb 11.6 oz (122.8 kg)   SpO2: 93% 93%  92%   Weight change: 6 lb 2.8 oz (2.8 kg)  Intake/Output Summary (Last 24 hours) at 02/14/16 1147 Last data filed at 02/14/16 0425  Gross per 24 hour  Intake    533 ml  Output   1400 ml  Net   -867 ml   General: Vital signs reviewed.  Patient is well-developed and well-nourished, in no acute distress and cooperative with exam.  Neck: Thyromegaly, no stridor. Cardiovascular: Tachycardic, regular rhythm, S1 normal, S2 normal. Pulmonary/Chest: Inspiratory crackles in lower lung fields bilaterally, no rhonchi or wheezes. No accessory muscle use, but tachypneic. Abdominal: Soft, non-tender, non-distended, BS +  Extremities: No lower extremity edema bilaterally, pulses symmetric and intact bilaterally.  Psychiatric: Depressed mood and flat affect  Lab Results: Basic Metabolic Panel:  Recent Labs Lab 02/11/16 0942 02/11/16 1610 02/13/16 0516  NA 137  --  135  K 3.5  --  3.8  CL 107  --  106  CO2 20*  --  22  GLUCOSE 107*  --  106*  BUN 7  --  7  CREATININE 0.41*  --  0.41*  CALCIUM 8.8*  --  8.7*  MG  --  2.1  --    Liver Function Tests:  Recent Labs Lab 02/11/16 0942  AST 23  ALT 19  ALKPHOS 131*  BILITOT 1.0  PROT 6.7  ALBUMIN 2.6*   CBC:  Recent Labs Lab 02/11/16 0942 02/12/16 0320 02/13/16 0516  WBC 9.8 8.0 9.7  NEUTROABS 7.6 5.3  --   HGB 10.4* 9.2* 9.6*  HCT  31.7* 29.3* 29.4*  MCV 70.1* 69.6* 70.3*  PLT 247 270 261   Thyroid Function Tests:  Recent Labs Lab 02/11/16 1640  TSH 0.045*  FREET4 5.33*  T3FREE 17.7*   Urine Drug Screen: Drugs of Abuse     Component Value Date/Time   LABOPIA NONE DETECTED 02/11/2016 0942   COCAINSCRNUR NONE DETECTED 02/11/2016 0942   LABBENZ NONE DETECTED 02/11/2016 0942   AMPHETMU NONE DETECTED 02/11/2016 0942   THCU NONE DETECTED 02/11/2016 0942   LABBARB NONE DETECTED 02/11/2016 0942    Urinalysis:  Recent Labs Lab 02/11/16 0942  COLORURINE AMBER*  LABSPEC 1.025  PHURINE 5.0  GLUCOSEU NEGATIVE  HGBUR LARGE*  BILIRUBINUR MODERATE*  KETONESUR 40*  PROTEINUR >300*  NITRITE NEGATIVE  LEUKOCYTESUR TRACE*   Micro Results: Recent Results (from the past 240 hour(s))  Blood culture (routine x 2)     Status: None (Preliminary result)   Collection Time: 02/11/16 12:45 PM  Result Value Ref Range Status   Specimen Description BLOOD LEFT ANTECUBITAL  Final   Special Requests BOTTLES DRAWN AEROBIC AND ANAEROBIC 5CC  Final   Culture NO GROWTH 3 DAYS  Final   Report Status PENDING  Incomplete  Blood culture (routine x 2)     Status: None (Preliminary result)   Collection Time: 02/11/16  4:40 PM  Result Value Ref Range Status   Specimen Description BLOOD LEFT HAND  Final   Special Requests IN PEDIATRIC BOTTLE 3CC  Final   Culture NO GROWTH 3 DAYS  Final   Report Status PENDING  Incomplete  MRSA PCR Screening     Status: None   Collection Time: 02/11/16  5:25 PM  Result Value Ref Range Status   MRSA by PCR NEGATIVE NEGATIVE Final    Comment:        The GeneXpert MRSA Assay (FDA approved for NASAL specimens only), is one component of a comprehensive MRSA colonization surveillance program. It is not intended to diagnose MRSA infection nor to guide or monitor treatment for MRSA infections.    Medications:  I have reviewed the patient's current medications. Prior to Admission:    Prescriptions prior to admission  Medication Sig Dispense Refill Last Dose  . methimazole (TAPAZOLE) 5 MG tablet Take 1 tablet (5 mg total) by mouth daily. 14 tablet 0 02/10/2016 at Unknown time  . Prenatal Vit-Fe Fumarate-FA (GOODSENSE PRENATAL VITAMINS) 28-0.8 MG TABS Take 1 tablet by mouth daily. 30 tablet 1 12/17/2015 at Unknown time   Scheduled Meds: . antiseptic oral rinse  7 mL Mouth Rinse BID  . azithromycin  500 mg Oral Daily  . cefTRIAXone (ROCEPHIN)  IV  1 g Intravenous Q24H  . dextromethorphan  60 mg Oral BID  . enoxaparin (LOVENOX) injection  40 mg Subcutaneous Q24H  . labetalol  100 mg Oral Once  . labetalol  300 mg Oral BID  . methimazole  5 mg Oral BID  . prenatal multivitamin  1 tablet Oral Q1200  . sodium chloride flush  3 mL Intravenous Q12H   Continuous Infusions:  PRN Meds:.acetaminophen, sodium chloride flush Assessment/Plan: Principal Problem:   CAP (community acquired pneumonia) Active Problems:   Graves disease   Hypertension   Acute respiratory failure with hypoxia (HCC)   Community acquired pneumonia   Morbid obesity due to excess calories (HCC)   Ultrasound to check fetal anatomy and growth in twin pregnancy, antepartum   Pregnancy  Tara [name] is a [id number] 25 year old lady with PMH of Graves Disease, Thyroid Storm, and HTN who presents with SOB, DOE, and dry cough. She is pregnant at about 13-14 weeks. She has multiple hospital admissions for PNA in the past reportedly requiring intubation 4 times. She also had an episode of thyroid storm in 2012.  Multi-Focal Community Acquired Pneumonia: CXR with bilateral multifocal pneumonia without effusion. Patient continues to feel SOB above her baseline, but does she that she is improving. Overnight and this morning, she was satting 92-93% on room air, but opted to place her oxygen back on as it subjectively improves her symptoms. Her main complaint is her dry cough. Per pharmacy, Delsym is Category A for  pregnancy, therefore we will initiate this. She has been afebrile for 24 hours, WBC stable at 10, BCx NGTD x 3 days. Given her improvement, we will continue current treatment. -Continue IV Azithromycin and Ceftriaxone (Started 2/22) -Delsym 60 mg BID for cough -BCx 2/22>> NGT  Graves Disease: Free T4 is 5.33 and TSH 0.045 with free T317.7 likely due to non-compliance with Methimazole. Patient is on methimazole 5 mg daily at home.  -Continue Methimazole 5 mg po BID -TBG pending -Will need repeat serum free T4 in 2-4 weeks with monitoring every  2-4 weeks -Continue to monitor for thyroid storm  HTN: SBP 120-140s/50-60s, HR 110-120 overnight. -Increase Labetolol to 300 mg po BID  Pregnancy: [redacted] weeks gestation by LMP, 12 weeks by [place]. Initially twin gestation, now single pregnancy. Discussed case with OB/Gyn, recommend continued treatment as single pregnancy, no changes in management. Patient with microcytic anemia, which is common in pregnancy. She is receiving prenatal vitamins which supplements iron. -Continue prenatal vitamins -Pregnancy safe medications only  DVT/PE ppx: SCDs  Dispo: Disposition is deferred at this time, awaiting improvement of current medical problems.  Anticipated discharge in approximately 2-3 day(s).   The patient does have a current PCP (No Pcp Per Patient) and does not need an [facility] hospital follow-up appointment after discharge.  The patient does not have transportation limitations that hinder transportation to clinic appointments.   LOS: 2 days   Jill Alexanders, DO PGY-2 Internal Medicine Resident Pager # [phone] 02/14/2016 11:47 AM

## 2016-02-15 LAB — C DIFFICILE QUICK SCREEN W PCR REFLEX
C DIFFICILE (CDIFF) INTERP: NEGATIVE
C Diff antigen: NEGATIVE
C Diff toxin: NEGATIVE

## 2016-02-15 MED ORDER — PYRIDOXINE HCL 25 MG PO TABS
12.5000 mg | ORAL_TABLET | Freq: Once | ORAL | Status: DC
  Filled 2016-02-15: qty 1

## 2016-02-15 MED ORDER — ONDANSETRON HCL 4 MG PO TABS
4.0000 mg | ORAL_TABLET | Freq: Once | ORAL | Status: AC
  Administered 2016-02-15: 4 mg via ORAL
  Filled 2016-02-15: qty 1

## 2016-02-15 MED ORDER — DOXYLAMINE SUCCINATE (SLEEP) 25 MG PO TABS: 12.5000 mg | ORAL_TABLET | Freq: Once | ORAL | Status: DC

## 2016-02-15 MED ORDER — ONDANSETRON HCL 4 MG PO TABS
4.0000 mg | ORAL_TABLET | Freq: Once | ORAL | Status: AC
Start: 2016-02-15 — End: 2016-02-15
  Administered 2016-02-15: 4 mg via ORAL
  Filled 2016-02-15: qty 1

## 2016-02-15 NOTE — Progress Notes (Signed)
Pt having dry heaves and vomiting small amount, states feels like it is the macaroni she ate for lunch.  Cool cloth to forehead and neck.  Texted Dr Allena Katz to see if can order something for nausea.

## 2016-02-15 NOTE — Progress Notes (Signed)
   Subjective: Patient feels she is slowly improving. She says her breathing is getting better, but still requires o2 via nasal cannula and has shortness of breath when walking to the bathroom. She says her cough is also improving. She reports several loose stools overnight, but denies fever, chills, nausea, or vomiting. Objective: Vital signs in last 24 hours: Filed Vitals:   02/14/16 0500 02/14/16 1303 02/14/16 2211 02/15/16 0521  BP: 132/67 131/69 143/77 128/59  Pulse: 112 93 125 104  Temp: 98.4 F (36.9 C) 97.7 F (36.5 C) 98.6 F (37 C) 98.8 F (37.1 C)  TempSrc: Oral Oral Oral Oral  Resp: 18 18 17 17   Height:      Weight:    267 lb 10.2 oz (121.4 kg)  SpO2: 92% 99% 92% 97%   Weight change: -7 lb 7.9 oz (-3.4 kg)  Intake/Output Summary (Last 24 hours) at 02/15/16 0846 Last data filed at 02/15/16 0500  Gross per 24 hour  Intake    820 ml  Output      0 ml  Net    820 ml   General: laying in bed, on 3L O2 via nasal cannula Cardiac: tachy, no rubs, murmurs or gallops Pulm: mild bibasilar crackles, tachypneic, no wheezing appreciated Abd: soft, nontender, nondistended, BS present Ext: warm and well perfused, no pedal edema Neuro: alert and oriented X3, blunt affect  Assessment/Plan: Principal Problem:   CAP (community acquired pneumonia) Active Problems:   Graves disease   Hypertension   Acute respiratory failure with hypoxia (HCC)   Community acquired pneumonia   Morbid obesity due to excess calories (HCC)   Ultrasound to check fetal anatomy and growth in twin pregnancy, antepartum   Pregnancy  Tara [name] is a [id number] 25 year old lady with PMH of Graves Disease, Thyroid Storm, and HTN who presents with SOB, DOE, and dry cough. She is pregnant at about 13-14 weeks. She has multiple hospital admissions for PNA in the past reportedly requiring intubation 4 times. She also had an episode of thyroid storm in 2012.  Multi-focal Pneumonia: CXR with bilateral  airspace opacities suggestive of multifocal pneumonia. She is now tolerating nasal cannula, oxygenating >95% on 3L. Patient reports her cough is improving and her difficulty breathing is slowly getting better, but still requires oxygen via nasal cannula. Patient has multiple loose stools overnight, C Diff PCR collected and was negative. -Continue IV Azithromycin and Ceftriaxone (Started 2/22) -Continue respiratory support with oxygen as needed for SpO2 >95% -Continue Delsym 60 mg BID for cough -f/u blood Cx 2/22 >> NGTD   Graves disease: Free T4 is 5.33 and TSH 0.045 with free T317.7. Patient with overt hyperthyroidism, which we will continue to treat.  -Continue Methimazole 5 mg po BID -f/u TBG -will need to monitor serum free T4 every 2-4 weeks -Monitor for thyroid storm  HTN: SBP 120-140s, HR low 100s-120s overnight -Continue Labetolol 300 mg po BID   Pregnancy: [redacted] weeks gestation by LMP, ~12 weeks by U/S yesterday. Initially twin gestation, now single pregnancy. Discussed case with OB/Gyn, recommend continued treatment as single pregnancy, no changes in management. Patient with microcytic anemia, which is common in pregnancy. She is receiving prenatal vitamins which supplements iron. -Continue prenatal vitamins -Pregnancy safe medications only   Dispo: Disposition is deferred at this time, awaiting improvement of current medical problems.  Anticipated discharge in approximately 2-3 day(s).      LOS: 3 days   Darreld Mclean, MD 02/15/2016, 8:46 AM

## 2016-02-15 NOTE — Discharge Summary (Signed)
Name: Tara [name] MRN: [id number] DOB: [birth year] 25 y.o. PCP: No Pcp Per Patient  Date of Admission: 02/11/2016  9:02 AM Date of Discharge: 02/18/2016 Attending Physician: Doneen Poisson, MD  Discharge Diagnosis: 1. Acute on chronic respiratory failure 2/2 to multi-focal pneumonia Principal Problem:   CAP (community acquired pneumonia) Active Problems:   Graves disease   Hypertension   Acute respiratory failure with hypoxia (HCC)   Community acquired pneumonia   Morbid obesity due to excess calories (HCC)   Ultrasound to check fetal anatomy and growth in twin pregnancy, antepartum   Pregnancy  Discharge Medications:   Medication List    TAKE these medications        dextromethorphan 30 MG/5ML liquid  Commonly known as:  DELSYM  Take 10 mLs (60 mg total) by mouth 2 (two) times daily.     GOODSENSE PRENATAL VITAMINS 28-0.8 MG Tabs  Take 1 tablet by mouth daily.     hydrOXYzine 50 MG tablet  Commonly known as:  ATARAX/VISTARIL  Take 1 tablet (50 mg total) by mouth every 6 (six) hours as needed for anxiety.     labetalol 300 MG tablet  Commonly known as:  NORMODYNE  Take 1 tablet (300 mg total) by mouth 2 (two) times daily.     methimazole 5 MG tablet  Commonly known as:  TAPAZOLE  Take 1 tablet (5 mg total) by mouth 2 (two) times daily.        Disposition and follow-up:   Ms.Farha Tara [name] was discharged from [facility] in Stable condition.  At the hospital follow up visit please address:  1.   Respiratory status, oxygen need  Graves disease, methimazole titration as indicated per free T4 (need to keep at high normal or slightly elevated)  Medication adherence  Keeps OB/Gyn appointment, pregnancy safe medications  HTN/Tachycardia control on Labetalol 300 mg BID   2.  Labs / imaging needed at time of follow-up: free T4 monitoring every 2-4 weeks, CXR in 4-6 weeks  3.  Pending labs/ test needing follow-up: TBG  Follow-up  Appointments: Follow-up Information    Follow up with Tara [name], CNM. Go on 02/26/2016.   Specialty:  Obstetrics and Gynecology   Why:  At 7:45 am.   Contact information:   [address] [place] [address] [phone]       Follow up with Tara Meeker, MD. Go on 02/26/2016.   Specialty:  Internal Medicine   Why:  At 2:45 pm   Contact information:   [address] [place] [address] [phone]       Discharge Instructions: Discharge Instructions    Call MD for:  difficulty breathing, headache or visual disturbances    Complete by:  As directed      Call MD for:  persistant dizziness or light-headedness    Complete by:  As directed      Call MD for:  persistant nausea and vomiting    Complete by:  As directed      Call MD for:  severe uncontrolled pain    Complete by:  As directed      Call MD for:  temperature >100.4    Complete by:  As directed      Diet - low sodium heart healthy    Complete by:  As directed      Increase activity slowly    Complete by:  As directed  Consultations:    Procedures Performed:  Dg Chest 2 View  02/17/2016  CLINICAL DATA:  25 year old female pregnant patient with pneumonia and worsening dyspnea over night. Tachypnea. EXAM: CHEST  2 VIEW COMPARISON:  Chest x-ray 02/11/2016. FINDINGS: Lung volumes are normal. Worsening multifocal airspace disease throughout the lungs bilaterally, increasing in symmetry, with greater involvement in the left upper lobe than on yesterday's examination. Trace bilateral pleural effusions. Pulmonary vasculature is obscured. Heart size remains mildly enlarged. Upper mediastinal contours are within normal limits. IMPRESSION: 1. Given the asymmetry on the prior examination, this is favored to reflect evolving multilobar bronchopneumonia, which has worsened compared to the prior examination from 02/11/2016. Given the mild cardiomegaly, a component of superimposed pulmonary edema is not  excluded. Electronically Signed   By: Trudie Reed M.D.   On: 02/17/2016 08:34   Dg Chest 2 View  02/11/2016  CLINICAL DATA:  25 year old female with nonproductive cough for 2 weeks. Chest tightness and shortness of breath since yesterday. Initial encounter. EXAM: CHEST  2 VIEW COMPARISON:  Portable chest 1220 hours today. FINDINGS: Bilateral lower lobe as well as right upper lobe confluent airspace opacity. Left lower lobe consolidation. No definite pleural effusion. The left upper lobe appears spared. Mediastinal contours remain normal. Visualized tracheal air column is within normal limits. Mild scoliotic curvature of the spine. Otherwise No osseous abnormality identified. IMPRESSION: Appearance most suggestive of bilateral multifocal pneumonia. No pleural effusion at this time. Electronically Signed   By: Odessa Fleming M.D.   On: 02/11/2016 15:39   US Soft Tissue Head/neck  02/17/2016  CLINICAL DATA:  Graves disease.  Rule out tracheal compression. EXAM: THYROID ULTRASOUND TECHNIQUE: Ultrasound examination of the thyroid gland and adjacent soft tissues was performed. COMPARISON:  Chest radiograph - 02/11/2016 FINDINGS: There is marked diffuse heterogeneity of thyroid parenchymal echotexture. Right thyroid lobe Measurements: Enlarged measuring 6.2 x 3.3 x 2.9 cm. There is marked diffuse heterogeneity the right lobe of the thyroid without discrete nodule or mass. Left thyroid lobe Measurements: Enlarged measuring 6.6 x 3.1 x 2.12 cm. There is marked diffuse heterogeneity the left lobe of the thyroid without discrete nodule or mass. Isthmus Thickness: Enlarged measuring 0.9 cm in diameter No discrete nodule or mass is identified within the thyroid isthmus. Lymphadenopathy None visualized. IMPRESSION: Nonspecific enlarged and heterogeneous appearing thyroid gland without discrete nodule or mass. If there is concern for tracheal compression (though no tracheal deviation or definitive mass effect was appreciated  on chest radiograph performed 02/11/2016), further evaluation with chest CT could be performed as indicated. Electronically Signed   By: Simonne Come M.D.   On: 02/17/2016 12:56   US Ob Comp Less 14 Wks  02/12/2016  CLINICAL DATA:  Patient with history of twin gestation. Evaluate growth in pregnancy. EXAM: TWIN OBSTETRICAL ULTRASOUND <14 WKS COMPARISON:  Ultrasound 12/18/2015 FINDINGS: TWIN 1 (A) Intrauterine gestational sac: Present Yolk sac:  Present Embryo:  Present Cardiac Activity: Not present Heart Rate: 0 bpm CRL:   14.4  mm   7 w 5 d                  [place] EDC: 09/25/2016 TWIN 2 (B) Intrauterine gestational sac: Visualized/normal in shape. Yolk sac:  Present Embryo:  Present Cardiac Activity: Present Heart Rate: 165 bpm CRL:   57.1  mm   12 w 2d                  [place] EDC: 08/24/2016 Maternal uterus/adnexae: Small cysts are demonstrated  within the right and left ovaries. No significant free fluid. No subchorionic hemorrhage identified. IMPRESSION: Two intrauterine gestational sacs are present compatible with twin gestation. There is a single live intrauterine gestation within one of the gestational sacs with an estimated gestational age of [redacted] weeks 2 days. There is a fetal pole within the second gestational sac with a crown-rump length of 14.4 mm and no cardiac activity. Electronically Signed   By: Annia Belt M.D.   On: 02/12/2016 13:52   US Ob Comp Addl Gest Less 14 Wks  02/12/2016  CLINICAL DATA:  Patient with history of twin gestation. Evaluate growth in pregnancy. EXAM: TWIN OBSTETRICAL ULTRASOUND <14 WKS COMPARISON:  Ultrasound 12/18/2015 FINDINGS: TWIN 1 (A) Intrauterine gestational sac: Present Yolk sac:  Present Embryo:  Present Cardiac Activity: Not present Heart Rate: 0 bpm CRL:   14.4  mm   7 w 5 d                  [place] EDC: 09/25/2016 TWIN 2 (B) Intrauterine gestational sac: Visualized/normal in shape. Yolk sac:  Present Embryo:  Present Cardiac Activity: Present Heart Rate: 165 bpm CRL:   57.1  mm    12 w 2d                  [place] EDC: 08/24/2016 Maternal uterus/adnexae: Small cysts are demonstrated within the right and left ovaries. No significant free fluid. No subchorionic hemorrhage identified. IMPRESSION: Two intrauterine gestational sacs are present compatible with twin gestation. There is a single live intrauterine gestation within one of the gestational sacs with an estimated gestational age of [redacted] weeks 2 days. There is a fetal pole within the second gestational sac with a crown-rump length of 14.4 mm and no cardiac activity. Electronically Signed   By: Annia Belt M.D.   On: 02/12/2016 13:52   Dg Chest Portable 1 View  02/11/2016  CLINICAL DATA:  25 year old female with nonproductive cough and shortness of breath for 2 weeks. Chest pain since yesterday. Initial encounter. EXAM: PORTABLE CHEST 1 VIEW COMPARISON:  None. FINDINGS: Portable AP semi upright view at 1220 hours. Large body habitus and low lung volumes. Cardiomegaly versus accentuated cardiac size. Other mediastinal contours are within normal limits. Visualized tracheal air column is within normal limits. Nodular in confluent bibasilar pulmonary opacity. No definite pleural effusion. No pneumothorax or definite edema. IMPRESSION: 1. Low lung volumes with abnormal bibasilar pulmonary opacity, nonspecific but suspicious for bilateral pneumonia. 2. Possible cardiomegaly. 3. Recommend PA and lateral views of the chest when possible. Electronically Signed   By: Odessa Fleming M.D.   On: 02/11/2016 12:30    2D Echo: TTE 02/11/2016 - Procedure narrative: Transthoracic echocardiography. Image quality was adequate. The study was technically difficult. - Left ventricle: The cavity size was mildly dilated. Systolic function was mildly reduced. The estimated ejection fraction was in the range of 45% to 50%. Wall motion was normal; there were no regional wall motion abnormalities. - Mitral valve: There was mild regurgitation. - Left atrium:  The atrium was moderately dilated.  Cardiac Cath: n/a  Admission HPI: Ms. Annali Lybrand is a 25 year old lady with PMH of Graves Disease, Thyroid Storm, and HTN who presents with dyspnea. Patient is [id number] and currently [redacted] weeks pregnant. Patient reports 2 weeks history of dry cough. 2 days ago she began to have SOB and DOE. She says her breathing was worse with activity and laying flat. Her cough and breathing were improved when sitting  upright. She also reports chest tightness, described as pressure-like sensation, located at her left upper chest wall above her breast. She denies any radiation to her arms, neck, jaw or back. She also complains of associated dizziness described as room-spinning sensation. She denies any falls.  Patient reports similar episodes in the past for which she needed hospitalization and intubation 4 different times. 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 She was hospitalized twice last year for pneumonia (March and August) and fluid in the lungs. She says she recovered after each hospitalization without further SOB, DOE between episodes. This is her 4th pregnancy with two health children. She had one miscarriage. She denies any personal or family history of blood clots. She is not taking prenatal vitamins as they make her sick. She denies any vaginal bleeding/discharge or abdominal cramping. For her Graves disease, she takes methimazole 5 mg. She admits to occasional missed doses, maybe 2x a week. She has not seen an endocrinologist in over a year (previously followed at [facility]).  On arrival she was hypoxic at 88% on room air, placed on [place] and was hypoxic in the 80s on 6L. Then placed on NRB, saturating 100%.  ED Vitals:  BP 154/114 mmHg  Pulse 113  Temp(Src) 97.7 F (36.5 C) (Oral)  Resp 24  Ht 5\' 6"  (1.676 m)  Wt 267 lb 2 oz (121.167 kg)  BMI 43.14 kg/m2  SpO2 96%  LMP 11/03/2015  Hospital Course by problem list: Principal Problem:   CAP  (community acquired pneumonia) Active Problems:   Graves disease   Hypertension   Acute respiratory failure with hypoxia (HCC)   Community acquired pneumonia   Morbid obesity due to excess calories (HCC)   Ultrasound to check fetal anatomy and growth in twin pregnancy, antepartum   Pregnancy   Multi-focal Pneumonia: Patient hypoxic on arrival with O2 in low 80s on room air. She required NRB for good O2 saturation on admission at 15 L. CXR showed bilateral airspace opacities suggestive of multifocal pneumonia. She was started on IV Ceftriaxone and Azithromycin and completed course while in the hospital. Her breathing slowly improved, transitioning from non-rebreather to venturi mask to nasal cannula. Lower extremity dopplers were negative for DVT and suspicion for PE was low, although considered in her current state, as her pneumonia could explain her symptoms. There is likely some underlying anxiety with respect to her occasional rapid shallow breathing and she is started on prn Vistaril for this. Patient discharged with home oxygen as she desaturated to mid-70s while ambulating on room air.  Graves Disease: Free T4 was 5.33, TSH 0.045, and free T317.7. Patient with overt hyperthyroidism. Methimazole was increased from 5 mg once daily to twice daily. She did not exhibit signs of thyroid storm during stay. U/S of soft tissue head/neck showed enlarged thyroid. There was concern for tracheal compression from her thyromegaly, however U/S and CXR did not show evidence of this and further workup with CT was deferred in her pregnant state. Will need monitoring of thyroid function every 2-4 weeks and adjustment of methimazole dosing as indicated.  HTN: Patient hypertensive on admission. Previously on metoprolol which was discontinued about 2 months prior. During admission she was started on Labetalol which was titrated up to 300 mg BID with good control of her BP and tachycardia.  Pregnancy: [redacted] weeks  gestation by LMP, ~12 weeks by U/S during admission. Initially twin gestation, now single pregnancy. Discussed case with OB/Gyn, recommend continued treatment as single pregnancy, no  changes in management. Patient with microcytic anemia, which is common in pregnancy. Prenatal vitamins were continued.   Discharge Vitals:   BP 126/69 mmHg  Pulse 93  Temp(Src) 98.5 F (36.9 C) (Oral)  Resp 20  Ht 5\' 6"  (1.676 m)  Wt 271 lb 6.2 oz (123.1 kg)  BMI 43.82 kg/m2  SpO2 97%  LMP 11/03/2015  Discharge Labs:  Results for orders placed or performed during the hospital encounter of 02/11/16 (from the past 24 hour(s))  CBC     Status: Abnormal   Collection Time: 02/18/16  6:14 AM  Result Value Ref Range   WBC 8.6 4.0 - 10.5 K/uL   RBC 3.97 3.87 - 5.11 MIL/uL   Hemoglobin 8.5 (L) 12.0 - 15.0 g/dL   HCT 27.5 (L) 17.0 - 01.7 %   MCV 70.5 (L) 78.0 - 100.0 fL   MCH 21.4 (L) 26.0 - 34.0 pg   MCHC 30.4 30.0 - 36.0 g/dL   RDW 49.4 49.6 - 75.9 %   Platelets 329 150 - 400 K/uL    Signed: Darreld Mclean, MD 02/18/2016, 11:02 AM    Services Ordered on Discharge: none Equipment Ordered on Discharge: Home oxygen

## 2016-02-16 LAB — CULTURE, BLOOD (ROUTINE X 2)
CULTURE: NO GROWTH
Culture: NO GROWTH

## 2016-02-16 LAB — BASIC METABOLIC PANEL
ANION GAP: 10 (ref 5–15)
BUN: 6 mg/dL (ref 6–20)
CALCIUM: 8.5 mg/dL — AB (ref 8.9–10.3)
CO2: 23 mmol/L (ref 22–32)
Chloride: 105 mmol/L (ref 101–111)
Creatinine, Ser: 0.44 mg/dL (ref 0.44–1.00)
GFR calc Af Amer: >60 mL/min (ref >60)
GLUCOSE: 109 mg/dL — AB (ref 65–99)
Potassium: 4.1 mmol/L (ref 3.5–5.1)
SODIUM: 138 mmol/L (ref 135–145)

## 2016-02-16 LAB — BLOOD GAS, ARTERIAL
ACID-BASE DEFICIT: 1.2 mmol/L (ref 0.0–2.0)
BICARBONATE: 22.7 meq/L (ref 20.0–24.0)
Collection site: 362771
O2 CONTENT: 3 L/min
O2 SAT: 93.1 %
PO2 ART: 68.9 mmHg — AB (ref 80.0–100.0)
Patient temperature: 98.6
TCO2: 23.7 mmol/L (ref 0–100)
pCO2 arterial: 35.7 mmHg (ref 35.0–45.0)
pH, Arterial: 7.419 (ref 7.350–7.450)

## 2016-02-16 NOTE — Progress Notes (Signed)
Internal Medicine Attending  Date: 02/16/2016  Patient name: Tara [name] Medical record number: [id number] Date of birth: 04/20/1991 Age: 25 y.o. Gender: female  I saw and evaluated the patient. I reviewed the resident's note by Dr. Allena Katz and I agree with the resident's findings and plans as documented in his progress note.  [name] continues to subjectively improve from a respiratory standpoint. That being said, she still desaturates with ambulation. Therefore, clinically her multifocal community-acquired pneumonia is resolving slowly but surely. She has completed the course of azithromycin and we will complete the course of ceftriaxone which includes 2 more days. We will encourage her to continue to ambulate as we prepare her for discharge likely in the next 48 hours or so.

## 2016-02-16 NOTE — Evaluation (Signed)
Physical Therapy Evaluation Patient Details Name: Tara [name] MRN: [id number] DOB: [birth year] Today's Date: 02/16/2016   History of Present Illness  25 yo pregnant female admitted with CAP. PMhx: Graves Disease, HTN, obesity  Clinical Impression  Pt pleasant with decreased activity tolerance and cardiopulmonary status who will benefit from acute therapy to maximize mobility, function and gait to return pt to PLOF. Pt educated for energy conservation, need to monitor oxygen sats and current need for supplemental oxygen. EOB on RA pt sats dropped to 86%, with gait sats 89-91% on 3 L with standing rest breaks every 20' and cues for pursed lip breathing. Pt tends to hyperventilate with short shallow breaths despite cues. Pt encouraged to be OOB at least 3x/day and continue daily gait with nursing staff.     Follow Up Recommendations No PT follow up    Equipment Recommendations  None recommended by PT    Recommendations for Other Services       Precautions / Restrictions Precautions Precaution Comments: watch O2 sats      Mobility  Bed Mobility               General bed mobility comments: EOB on arrival  Transfers Overall transfer level: Modified independent                  Ambulation/Gait Ambulation/Gait assistance: Supervision Ambulation Distance (Feet): 400 Feet Assistive device: None Gait Pattern/deviations: Step-through pattern;Decreased stride length   Gait velocity interpretation: Below normal speed for age/gender General Gait Details: cues for energy conservation, breathing technique, decreased gait speed  Stairs            Wheelchair Mobility    Modified Rankin (Stroke Patients Only)       Balance Overall balance assessment: No apparent balance deficits (not formally assessed)                                           Pertinent Vitals/Pain Pain Assessment: No/denies pain    Home Living Family/patient expects to  be discharged to:: Private residence Living Arrangements: Non-relatives/Friends Available Help at Discharge: Friend(s);Available PRN/intermittently Type of Home: House Home Access: Stairs to enter   [employer] of Steps: 4 Home Layout: One level Home Equipment: None      Prior Function Level of Independence: Independent               Hand Dominance        Extremity/Trunk Assessment   Upper Extremity Assessment: Overall WFL for tasks assessed           Lower Extremity Assessment: Overall WFL for tasks assessed      Cervical / Trunk Assessment: Other exceptions  Communication   Communication: No difficulties  Cognition Arousal/Alertness: Awake/alert Behavior During Therapy: WFL for tasks assessed/performed Overall Cognitive Status: Within Functional Limits for tasks assessed                      General Comments      Exercises        Assessment/Plan    PT Assessment Patient needs continued PT services  PT Diagnosis Difficulty walking   PT Problem List Decreased activity tolerance;Decreased mobility;Cardiopulmonary status limiting activity  PT Treatment Interventions Gait training;Functional mobility training;Therapeutic activities;Patient/family education   PT Goals (Current goals can be found in the Care Plan section) Acute Rehab PT Goals Patient Stated  Goal: return home PT Goal Formulation: With patient Time For Goal Achievement: 02/23/16 Potential to Achieve Goals: Good    Frequency Min 3X/week   Barriers to discharge Decreased caregiver support      Co-evaluation               End of Session   Activity Tolerance: Patient tolerated treatment well Patient left: in chair;with call bell/phone within reach Nurse Communication: Mobility status         Time: 1225-1248 PT Time Calculation (min) (ACUTE ONLY): 23 min   Charges:   PT Evaluation $PT Eval Moderate Complexity: 1 Procedure     PT G CodesDelorse Lek 02/16/2016, 12:57 PM Delaney Meigs, PT [phone]

## 2016-02-16 NOTE — Progress Notes (Signed)
SUBJECTIVE Called by RN for increased work of breathing. Patient was sitting on the commode on arrival feeling tachypneic and breathing shallow. Complaining of sore throat/congestion.  OBJECTIVE Filed Vitals:   02/16/16 1533 02/16/16 1756  BP: 116/67 131/57  Pulse: 94 102  Temp: 98.4 F (36.9 C) 97.9 F (36.6 C)  Resp: 20 32   General: young [place] American female, anxious-appearing Cardiac: tachycardic, no rubs, murmurs or gallops Pulm: clear to auscultation bilaterally though breathing shallow  ASSESSMENT Improving work of breathing with conversation is reassuring and likely anxiety-related. Still on 3L O2 throughout the conversation.   PLAN Check ABG and order early dose of cough syrup  ADDENDUM 02/16/2016  7:07 PM:  ABG reassuring for respiratory acidosis.

## 2016-02-16 NOTE — Progress Notes (Signed)
Subjective: Patient feels she is improving. Still with some SOB with ambulation. She tried sleeping without oxygen, but [place] was placed overnight after she felt some right sided chest pressure which resolved on its own after 10 minutes. She is able to breathe more comfortably laying flat. Her nausea has improved.  Objective: Vital signs in last 24 hours: Filed Vitals:   02/15/16 1427 02/15/16 2103 02/16/16 0613 02/16/16 1012  BP: 132/61 114/82 125/56 131/70  Pulse: 94 116 99 100  Temp: 97.9 F (36.6 C) 97.8 F (36.6 C) 98.9 F (37.2 C) 97.4 F (36.3 C)  TempSrc: Oral Oral Oral Oral  Resp: 18 17 18 20   Height:      Weight:   266 lb 15.6 oz (121.1 kg)   SpO2: 98% 94% 96% 94%   Weight change: -10.6 oz (-0.3 kg)  Intake/Output Summary (Last 24 hours) at 02/16/16 1053 Last data filed at 02/16/16 0900  Gross per 24 hour  Intake   1060 ml  Output      0 ml  Net   1060 ml   General: laying in bed, on 3L O2 via nasal cannula Cardiac: tachy, no rubs, murmurs or gallops Pulm: mild bibasilar crackles, no wheezing appreciated Abd: soft, nontender, nondistended, BS present Ext: warm and well perfused   Assessment/Plan: Principal Problem:   CAP (community acquired pneumonia) Active Problems:   Graves disease   Hypertension   Acute respiratory failure with hypoxia (HCC)   Community acquired pneumonia   Morbid obesity due to excess calories (HCC)   Ultrasound to check fetal anatomy and growth in twin pregnancy, antepartum   Pregnancy  [name] is a [id number] 25 year old lady with PMH of Graves Disease, Thyroid Storm, and HTN who presents with SOB, DOE, and dry cough. She is pregnant at about 13-14 weeks. She has multiple hospital admissions for PNA in the past reportedly requiring intubation 4 times. She also had an episode of thyroid storm in 2012.  Multi-focal Pneumonia: CXR with bilateral airspace opacities suggestive of multifocal pneumonia. She is tolerating nasal  cannula, oxygenating >95% on 3L. Patient reports her cough is improving and her difficulty breathing is slowly getting better, but still requires oxygen via nasal cannula when ambulating. -d/c Azithromycin -Continue IV Ceftriaxone (Started 2/22) for 7 total days -PT eval -Ambulate on room air -Continue Delsym 60 mg BID for cough -f/u blood Cx 2/22 >> NGTD   Graves disease: Free T4 is 5.33 and TSH 0.045 with free T317.7. Patient with overt hyperthyroidism, which we will continue to treat. Has been on higher doses methimazole in the past, which she likely needs, however it will be difficult for [place] to titrate up at this time. Will need monitoring outpatient to titrate methimazole as indicated. -Continue Methimazole 5 mg po BID -f/u TBG -will need to monitor serum free T4 every 2-4 weeks -Monitor for thyroid storm  HTN: SBP 120-130s, HR 90s-110s overnight -Continue Labetolol 300 mg po BID   Pregnancy: [redacted] weeks gestation by LMP, ~12 weeks by U/S yesterday. Initially twin gestation, now single pregnancy. Discussed case with OB/Gyn, recommend continued treatment as single pregnancy, no changes in management. Patient with microcytic anemia, which is common in pregnancy. She is receiving prenatal vitamins which supplements iron. -Continue prenatal vitamins -Pregnancy safe medications only   Dispo: Disposition is deferred at this time, awaiting improvement of current medical problems.  Anticipated discharge in approximately 1-2 day(s).      LOS: 4 days   Vishal  Allena Katz, MD 02/16/2016, 10:53 AM

## 2016-02-17 ENCOUNTER — Inpatient Hospital Stay (HOSPITAL_COMMUNITY): Payer: Medicaid Other

## 2016-02-17 DIAGNOSIS — R0682 Tachypnea, not elsewhere classified: Secondary | ICD-10-CM

## 2016-02-17 LAB — CBC
HCT: 27.1 % — ABNORMAL LOW (ref 36.0–46.0)
HEMOGLOBIN: 8.5 g/dL — AB (ref 12.0–15.0)
MCH: 21.8 pg — AB (ref 26.0–34.0)
MCHC: 31.4 g/dL (ref 30.0–36.0)
MCV: 69.5 fL — ABNORMAL LOW (ref 78.0–100.0)
PLATELETS: 293 10*3/uL (ref 150–400)
RBC: 3.9 MIL/uL (ref 3.87–5.11)
RDW: 13.1 % (ref 11.5–15.5)
WBC: 8.8 10*3/uL (ref 4.0–10.5)

## 2016-02-17 LAB — BASIC METABOLIC PANEL
ANION GAP: 6 (ref 5–15)
BUN: 6 mg/dL (ref 6–20)
CHLORIDE: 105 mmol/L (ref 101–111)
CO2: 24 mmol/L (ref 22–32)
CREATININE: 0.4 mg/dL — AB (ref 0.44–1.00)
Calcium: 8.7 mg/dL — ABNORMAL LOW (ref 8.9–10.3)
GFR calc non Af Amer: >60 mL/min (ref >60)
Glucose, Bld: 117 mg/dL — ABNORMAL HIGH (ref 65–99)
POTASSIUM: 3.7 mmol/L (ref 3.5–5.1)
SODIUM: 135 mmol/L (ref 135–145)

## 2016-02-17 MED ORDER — ENOXAPARIN SODIUM 40 MG/0.4ML ~~LOC~~ SOLN
40.0000 mg | SUBCUTANEOUS | Status: DC
  Administered 2016-02-17: 40 mg via SUBCUTANEOUS
  Filled 2016-02-17: qty 0.4

## 2016-02-17 MED ORDER — SERTRALINE HCL 50 MG PO TABS
50.0000 mg | ORAL_TABLET | Freq: Every day | ORAL | Status: DC
  Filled 2016-02-17: qty 1

## 2016-02-17 MED ORDER — HYDROXYZINE HCL 25 MG PO TABS: 50.0000 mg | ORAL_TABLET | Freq: Four times a day (QID) | ORAL | Status: DC | PRN

## 2016-02-17 MED ORDER — ONDANSETRON HCL 4 MG/2ML IJ SOLN
4.0000 mg | Freq: Once | INTRAMUSCULAR | Status: AC
  Administered 2016-02-17: 4 mg via INTRAVENOUS
  Filled 2016-02-17: qty 2

## 2016-02-17 MED ORDER — HYDROXYZINE HCL 25 MG PO TABS
50.0000 mg | ORAL_TABLET | Freq: Once | ORAL | Status: AC
  Administered 2016-02-17: 50 mg via ORAL
  Filled 2016-02-17: qty 2

## 2016-02-17 NOTE — Progress Notes (Addendum)
Night Team Progress Note  Subjective: Paged by nursing staff at 12:01 am informing us patient is tachypnic (RR 36), however, resting and in no apparent distress. We saw and evaluated the patient. Patient reports feeling tachypnic. Denies having any dyspnea, diaphoresis, chest pain, palpitations, abdominal pain, or leg pain/ calf pain. Denies feeling anxious.   Objective: Filed Vitals:   02/16/16 2118 02/17/16 0001  BP: 124/60 114/45  Pulse: 101 81  Temp: 98.5 F (36.9 C) 98.4 F (36.9 C)  Resp: 45 36   Physical exam: General: young african [place] female, diaphoretic  Cardiac: tachycardic. No murmurs, rubs, or gallops. Pulm: Tachypnic but does not appear to have increased work of breathing. Bibasilar rales appreciated.  Extremities: Calves are symmetrical in size, non-swollen, non-erythematous, and non-tender to palpation.   Assessment and Plan  Patient is currently diaphoretic, tachycardic, and tachypnic. She is satting 93% on 3L of O2 via [place]. Her current symptoms could be due to worsening pneumonia, however, PE is a concern because patient is currently pregnant (hypercoagulable state) and immobile for several days. In addition, nursing staff informed us tonight that patient has been refusing Lovenox for the past 5 days (last dose received 03/11/16). Well's score 3 for tachycardia and immobilization. Patient told us she does not want Lovenox only because it "stings." We had a very lengthy conversation with the patient educating her about the risks of not being on anticoagulation while being hospitalized during preganancy. She initially adamantly refused to take the drug. I was later informed by nursing staff that the patient agreed to start taking Lovenox and a dose was given to her tonight. Discussed benefits vs risks of doing a CXR and/ or further imaging to evaluate for PE with the patient. Patient is apprehensive about getting more imaging because she is concerned about the risk of  radiation exposure to her fetus and stated it was her understanding that she was not to undergo any further imaging tests. As such, we are holding ordering imaging at this time. We came to a conclusion that if the patient develops any more symptoms or symptoms become more concerning, CXR and possible further imaging will be ordered STAT and she agreed.

## 2016-02-17 NOTE — Progress Notes (Signed)
Paged MD on call and informed hime of patient's tachypnea-respirations of 50. Patient in no distress.  Will decrease O2 to 2L per MD.

## 2016-02-17 NOTE — Progress Notes (Signed)
Informed Dr. Atilano Ina of patient's tachypnea.  Patient resting at this time; in no apparent distress. Patient refused Lovenox this evening because she says it stings.  I informed her that she is at risk for developing a blood clot due to inactivity and bedrest.  I told her a clot could form in her legs and go to her lungs and kill her. I let her know that she could refuse the medication,but that she needed to understand the risk of refusing .  She acknowledged that she understood and agreed to wear the SCD's which I placed on her legs.

## 2016-02-17 NOTE — Progress Notes (Signed)
Subjective: Overnight patient had worsening dyspnea and tachypnea with respiratory rate from 30-50s. When night team saw her, she was satting 93% on 3L O2 via [place]. They were informed that patient had been refusing her Lovenox for VTE ppx for the last 4 days. They also discussed with patient the likely need for repeat CXR, and patient was refusing as she was under the impression that she is unable to have further imaging due to her pregnancy. The night team discussed the risks/benefits of lovenox and imaging. When seen this morning, I also discussed with her risks/benefits of VTE ppx and further imaging if necessary. She agrees to continue taking Lovenox and for repeat CXR.   She says her breathing has gotten worse overnight and has a sharp pain when she inhales throughout her chest and at her throat. She felt she was getting better until these changes last night. Her dyspnea is not related to position.   Objective: Vital signs in last 24 hours: Filed Vitals:   02/16/16 2118 02/17/16 0001 02/17/16 0413 02/17/16 0641  BP: 124/60 114/45 114/45 118/62  Pulse: 101 81 88 89  Temp: 98.5 F (36.9 C) 98.4 F (36.9 C) 98.3 F (36.8 C) 98.5 F (36.9 C)  TempSrc: Oral Oral Oral Oral  Resp: 45 36 28 50  Height:      Weight:   273 lb 2.4 oz (123.9 kg)   SpO2: 95% 93% 98% 98%   Weight change: 6 lb 2.8 oz (2.8 kg)  Intake/Output Summary (Last 24 hours) at 02/17/16 1139 Last data filed at 02/17/16 1610  Gross per 24 hour  Intake    600 ml  Output    300 ml  Net    300 ml   General: laying in bed, on 3L O2 via nasal cannula Neck: thyromegaly, slight tenderness to touch Cardiac: tachycardic, systolic murmur at LUS border Pulm: rapid shallow breaths, lungs clear, no wheezing or rales appreciated Abd: soft, nontender, nondistended, BS present Ext: no swelling, erythema, or tenderness to palpation   Assessment/Plan: Principal Problem:   CAP (community acquired pneumonia) Active Problems:  Graves disease   Hypertension   Acute respiratory failure with hypoxia (HCC)   Community acquired pneumonia   Morbid obesity due to excess calories (HCC)   Ultrasound to check fetal anatomy and growth in twin pregnancy, antepartum   Pregnancy  [name] is a [id number] 25 year old lady with PMH of Graves Disease, Thyroid Storm, and HTN who presents with SOB, DOE, and dry cough. She is pregnant at about 13-14 weeks. She has multiple hospital admissions for PNA in the past reportedly requiring intubation 4 times. She also had an episode of thyroid storm in 2012.  Multi-focal Pneumonia: CXR with bilateral airspace opacities suggestive of multifocal pneumonia. Finished course of Azithromycin and on last day of Ceftriaxone. Patient with apparent worsened dyspnea, tachypnea overnight with respirations up to 50, saturating well on 3L . Repeat CXR this AM was similar to prior. On exam, lung sounds improved compared to prior. ABG last night with pH 7.419, pCO2 35.7, O2 68.9. LE U/S done on admission without evidence of DVT. Will repeat these today in setting of hypoxia, tachypnea, and missed Lovenox doses. There may be some underlying anxiety component as well contributing to her symptoms and she does admit she is fearful of leaving the hospital with concern over her breathing.  -Continue IV Ceftriaxone (Started 2/22) for 7 total days (last dose today) -Ambulate on room air -Continue Delsym 60  mg BID for cough -Lower extremity dopplers to assess for DVT  Graves disease: Free T4 is 5.33 and TSH 0.045 with free T317.7. Patient with overt hyperthyroidism, which we will continue to treat. Has been on higher doses methimazole in the past, which she likely needs, however it will be difficult for [place] to titrate up at this time. Will need monitoring outpatient to titrate methimazole as indicated. With her continued discomfort with inspiration that is located at her neck to nose, there is concern for  compression from her thyromegaly. We will evaluate with U/S. -Continue Methimazole 5 mg po BID -f/u TBG -will need to monitor serum free T4 every 2-4 weeks -Soft tissue U/S head/neck to evaluate for tracheal compression  HTN: SBP 110-120s, HR 80s-low 100s overnight, under better control. -Continue Labetolol 300 mg po BID   Pregnancy: [redacted] weeks gestation by LMP, ~12 weeks by U/S yesterday. Initially twin gestation, now single pregnancy. Patient with microcytic anemia, which is common in pregnancy. She is receiving prenatal vitamins which supplements iron. I discussed her case again today with OB/Gyn with regards to medical management of anxiety. They advise that Vistaril is recommended for anxiety, and Zoloft is the SSRI of choice. Can try both now as Zoloft will take up to 2 weeks for effect. If necessary, Benzodiazepine such as Ativan can be tried once even though this is a Class D medication. This would be useful to differentiate between true anxiety versus other underlying process. -Continue prenatal vitamins -Pregnancy safe medications only -CBC in AM -Vistaril once today 50 mg po -Start Zoloft (sertraline) 50 mg PO once daily -Patient has OB/Gyn appointment scheduled on 02/26/2016 @ 7:45 am at [facility]   Dispo: Disposition is deferred at this time, awaiting improvement of current medical problems.  Anticipated discharge in approximately 1-3 day(s).      LOS: 5 days   Darreld Mclean, MD 02/17/2016, 11:39 AM

## 2016-02-17 NOTE — Progress Notes (Signed)
VASCULAR LAB PRELIMINARY  PRELIMINARY  PRELIMINARY  PRELIMINARY  Bilateral lower extremity venous duplex has been completed.     Bilateral:  No evidence of DVT, superficial thrombosis, or Baker's Cyst.  Left peroneal veins noo-visualized.  Jenetta Loges, RVT, RDMS 02/17/2016, 1:46 PM

## 2016-02-18 ENCOUNTER — Telehealth: Payer: Self-pay | Admitting: Internal Medicine

## 2016-02-18 DIAGNOSIS — J9621 Acute and chronic respiratory failure with hypoxia: Secondary | ICD-10-CM

## 2016-02-18 LAB — CBC
HCT: 28 % — ABNORMAL LOW (ref 36.0–46.0)
HEMOGLOBIN: 8.5 g/dL — AB (ref 12.0–15.0)
MCH: 21.4 pg — ABNORMAL LOW (ref 26.0–34.0)
MCHC: 30.4 g/dL (ref 30.0–36.0)
MCV: 70.5 fL — ABNORMAL LOW (ref 78.0–100.0)
Platelets: 329 10*3/uL (ref 150–400)
RBC: 3.97 MIL/uL (ref 3.87–5.11)
RDW: 13.1 % (ref 11.5–15.5)
WBC: 8.6 10*3/uL (ref 4.0–10.5)

## 2016-02-18 LAB — THYROXINE BINDING GLOBULIN: Thyroxine Bind Glob: 25.9 ug/mL (ref 13.5–30.9)

## 2016-02-18 MED ORDER — HYDROXYZINE HCL 50 MG PO TABS: 50.0000 mg | ORAL_TABLET | Freq: Four times a day (QID) | ORAL | Status: DC | PRN

## 2016-02-18 MED ORDER — LABETALOL HCL 300 MG PO TABS: 300.0000 mg | ORAL_TABLET | Freq: Two times a day (BID) | ORAL | Status: DC

## 2016-02-18 MED ORDER — METHIMAZOLE 5 MG PO TABS: 5.0000 mg | ORAL_TABLET | Freq: Two times a day (BID) | ORAL | Status: DC

## 2016-02-18 MED ORDER — DEXTROMETHORPHAN POLISTIREX ER 30 MG/5ML PO SUER: 60.0000 mg | Freq: Two times a day (BID) | ORAL | Status: DC

## 2016-02-18 NOTE — Progress Notes (Signed)
Subjective: Patient feels improved this morning, breathing well with O2 via [employer]. She is able to walk without issue on oxygen, however desaturates on room air to mid-70s while ambulating, 90% at rest. She saturates 94% on 2L. Otherwise patient feels as she is doing well and comfortable going home with home oxygen. No acute events overnight. Lower extremity dopplers were negative for DVT and Soft tissue head/neck U/S showed enlarged thyroid, but no evidence for compression. Patient feels her neck swelling is improving since admission.  Objective: Vital signs in last 24 hours: Filed Vitals:   02/17/16 0641 02/17/16 1322 02/17/16 2150 02/18/16 0443  BP: 118/62 122/60 117/64 126/69  Pulse: 89 84 104 93  Temp: 98.5 F (36.9 C) 98.7 F (37.1 C) 98.9 F (37.2 C) 98.5 F (36.9 C)  TempSrc: Oral Oral Oral   Resp: 50 38 23 20  Height:      Weight:    271 lb 6.2 oz (123.1 kg)  SpO2: 98% 97% 91% 97%   Weight change: -1 lb 12.2 oz (-0.8 kg)  Intake/Output Summary (Last 24 hours) at 02/18/16 1035 Last data filed at 02/18/16 0935  Gross per 24 hour  Intake    243 ml  Output      0 ml  Net    243 ml   General: laying in bed, on 3L O2 via nasal cannula Cardiac: RRR, no appreciable murmur Pulm: good air movement, mild bibasilar crackles, no wheezing or rhonchi Abd: soft, nondistended, BS present Ext: no edema   Assessment/Plan: Principal Problem:   CAP (community acquired pneumonia) Active Problems:   Graves disease   Hypertension   Acute respiratory failure with hypoxia (HCC)   Community acquired pneumonia   Morbid obesity due to excess calories (HCC)   Ultrasound to check fetal anatomy and growth in twin pregnancy, antepartum   Pregnancy  [name] is a [id number] 25 year old lady with PMH of Graves Disease, Thyroid Storm, and HTN who presents with SOB, DOE, and dry cough. She is pregnant at about 13-14 weeks. She has multiple hospital admissions for PNA in the past reportedly  requiring intubation 4 times. She also had an episode of thyroid storm in 2012.  Acute on chronic respiratory failure 2/2 Multi-focal Pneumonia: CXR with bilateral airspace opacities suggestive of multifocal pneumonia. Finished course of Azithromycin and Ceftriaxone. Patient feels improved this morning, breathing well with oxygen via [place]. She desaturates while ambulating on room air, likely secondary to PNA, and would benefit from home oxygen at this time especially for continued fetal oxygenation, history of multiple intubations, and recurrent pneumonia. Further workup is limited at this time with regards to her pregnancy, however would likely need further evaluation post-partum -Continue Delsym 60 mg BID for cough -Repeat CXR in 4-6 weeks  Graves disease: Free T4 is 5.33 and TSH 0.045 with free T317.7. Patient with overt hyperthyroidism, which we will continue to treat. Has been on higher doses methimazole in the past, which she likely needs, however it will be difficult for [place] to titrate up at this time. Will need monitoring outpatient to titrate methimazole as indicated.  -Continue Methimazole 5 mg po BID -will need to monitor serum free T4 every 2-4 weeks   HTN: SBP 110-120s, HR 80s-low 100s overnight, stable. -Continue Labetolol 300 mg po BID   Pregnancy: [redacted] weeks gestation by LMP, ~12 weeks by U/S yesterday. Initially twin gestation, now single pregnancy. Patient with microcytic anemia, common in pregnancy. She is receiving prenatal vitamins  which supplements iron. Continue Vistaril prn for anxiety/rapid breathing. -Continue prenatal vitamins -Pregnancy safe medications only -PRN Vistaril 50 mg po q6h -Patient has OB/Gyn appointment scheduled on 02/26/2016 @ 7:45 am at [facility]   Dispo: Discharge to home today with home oxygen.     LOS: 6 days   Darreld Mclean, MD 02/18/2016, 10:35 AM

## 2016-02-18 NOTE — Telephone Encounter (Signed)
Pt still in hosp as of this hour

## 2016-02-18 NOTE — Progress Notes (Signed)
SATURATION QUALIFICATIONS: (This note is used to comply with regulatory documentation for home oxygen)  Patient Saturations on Room Air at Rest = 90%  Patient Saturations on Room Air while Ambulating = 75%  Patient Saturations on 2 Liters of oxygen while Ambulating = 94%  Please briefly explain why patient needs home oxygen:

## 2016-02-18 NOTE — Progress Notes (Signed)
Discussed discharge summary with patient. Reviewed all medications with patient. Patient received home O2 equipment. Patient ready for discharge.

## 2016-02-18 NOTE — Telephone Encounter (Signed)
Pt needs TOC pt scheduled for 02/26/16 at 2:45 with Dr Tasia Catchings

## 2016-02-18 NOTE — Care Management Note (Signed)
Case Management Note  Patient Details  Name: Tara [name] MRN: [id number] Date of Birth: 12-30-1990  Subjective/Objective:                    Action/Plan:   Expected Discharge Date:                  Expected Discharge Plan:  Home/Self Care  In-House Referral:     Discharge planning Services  CM Consult  Post Acute Care Choice:    Choice offered to:     DME Arranged:  Oxygen DME Agency:  [facility]  HH Arranged:    [facility] Agency:     Status of Service:  Completed, signed off  Medicare Important Message Given:    Date Medicare IM Given:    Medicare IM give by:    Date Additional Medicare IM Given:    Additional Medicare Important Message give by:     If discussed at Long Length of Stay Meetings, dates discussed:    Additional Comments:  Kingsley Plan, RN 02/18/2016, 10:20 AM

## 2016-02-18 NOTE — Progress Notes (Signed)
Physical Therapy Treatment Patient Details Name: KATHLEEN CHANEY MRN: [id number] DOB: Dec 23, 1990 Today's Date: 02/18/2016    History of Present Illness 25 yo pregnant female admitted with CAP. PMhx: Graves Disease, HTN, obesity    PT Comments    Making progress with amb, still quite consistently  tachypneic, able to briefly slow down breathing with cues; Worth considering use of incentive spirometer; Politely declined stair training today; See below for more oxygen qualifying O2 sat numbers   Follow Up Recommendations  No PT follow up;Other (comment) (is it worth considering HHRN visits?)     Equipment Recommendations   (Oxygen)    Recommendations for Other Services       Precautions / Restrictions Precautions Precaution Comments: watch O2 sats    Mobility  Bed Mobility Overal bed mobility: Independent                Transfers Overall transfer level: Modified independent                  Ambulation/Gait Ambulation/Gait assistance: Supervision Ambulation Distance (Feet): 520 Feet Assistive device: None Gait Pattern/deviations: Step-through pattern;Decreased stride length   Gait velocity interpretation: Below normal speed for age/gender General Gait Details: cues for energy conservation, breathing technique, decreased gait speed   Stairs            Wheelchair Mobility    Modified Rankin (Stroke Patients Only)       Balance                                    Cognition Arousal/Alertness: Awake/alert Behavior During Therapy: WFL for tasks assessed/performed Overall Cognitive Status: Within Functional Limits for tasks assessed                      Exercises      General Comments General comments (skin integrity, edema, etc.):  SATURATION QUALIFICATIONS: (This note is used to comply with regulatory documentation for home oxygen)  Patient Saturations on Room Air at Rest = 90%  Patient Saturations on Room Air  while Ambulating = 85%  Patient Saturations on 2 Liters of oxygen while Ambulating = 90-92%  Please briefly explain why patient needs home oxygen: Patient requires supplemental oxygen to maintain oxygen saturations at acceptable, safe levels with physical activity.      Pertinent Vitals/Pain Pain Assessment: No/denies pain    Home Living                      Prior Function            PT Goals (current goals can now be found in the care plan section) Acute Rehab PT Goals Patient Stated Goal: return home PT Goal Formulation: With patient Time For Goal Achievement: 02/23/16 Potential to Achieve Goals: Good Progress towards PT goals: Progressing toward goals    Frequency  Min 3X/week    PT Plan Current plan remains appropriate    Co-evaluation             End of Session Equipment Utilized During Treatment: Oxygen Activity Tolerance: Patient tolerated treatment well Patient left: in chair;with call bell/phone within reach     Time: 1032-1101 PT Time Calculation (min) (ACUTE ONLY): 29 min  Charges:  $Gait Training: 23-37 mins                    G Codes:  Van Clines [facility] 02/18/2016, 12:54 PM  Van Clines, [place]  Acute Rehabilitation Services Pager [phone] Office [phone]

## 2016-02-18 NOTE — Discharge Instructions (Signed)
Please take your medications and prenatal vitamins as prescribed and keep your follow up appointments at [facility].  I am ordering home oxygen for you to help you breathe more comfortably. If you feel your breathing continues to improve, you may try to see how you feel off oxygen.  If you feel like your breathing is becoming too fast, you can use Vistaril (Hydroxyzine) as needed up to 4 times a day (every 6 hours).   Prenatal Care WHAT IS PRENATAL CARE?  Prenatal care is the process of caring for a pregnant woman before she gives birth. Prenatal care makes sure that she and her baby remain as healthy as possible throughout pregnancy. Prenatal care may be provided by a midwife, family practice health care provider, or a childbirth and pregnancy specialist (obstetrician). Prenatal care may include physical examinations, testing, treatments, and education on nutrition, lifestyle, and social support services. WHY IS PRENATAL CARE SO IMPORTANT?  Early and consistent prenatal care increases the chance that you and your baby will remain healthy throughout your pregnancy. This type of care also decreases a baby's risk of being born too early (prematurely), or being born smaller than expected (small for gestational age). Any underlying medical conditions you may have that could pose a risk during your pregnancy are discussed during prenatal care visits. You will also be monitored regularly for any new conditions that may arise during your pregnancy so they can be treated quickly and effectively. WHAT HAPPENS DURING PRENATAL CARE VISITS? Prenatal care visits may include the following: Discussion Tell your health care provider about any new signs or symptoms you have experienced since your last visit. These might include:  Nausea or vomiting.  Increased or decreased level of energy.  Difficulty sleeping.  Back or leg pain.  Weight changes.  Frequent  urination.  Shortness of breath with physical activity.  Changes in your skin, such as the development of a rash or itchiness.  Vaginal discharge or bleeding.  Feelings of excitement or nervousness.  Changes in your baby's movements. You may want to write down any questions or topics you want to discuss with your health care provider and bring them with you to your appointment. Examination During your first prenatal care visit, you will likely have a complete physical exam. Your health care provider will often examine your vagina, cervix, and the position of your uterus, as well as check your heart, lungs, and other body systems. As your pregnancy progresses, your health care provider will measure the size of your uterus and your baby's position inside your uterus. He or she may also examine you for early signs of labor. Your prenatal visits may also include checking your blood pressure and, after about 10-12 weeks of pregnancy, listening to your baby's heartbeat. Testing Regular testing often includes:  Urinalysis. This checks your urine for glucose, protein, or signs of infection.  Blood count. This checks the levels of white and red blood cells in your body.  Tests for sexually transmitted infections (STIs). Testing for STIs at the beginning of pregnancy is routinely done and is required in many states.  Antibody testing. You will be checked to see if you are immune to certain illnesses, such as rubella, that can affect a developing fetus.  Glucose screen. Around 24-28 weeks of pregnancy, your blood glucose level will be checked for signs of gestational diabetes. Follow-up tests may be recommended.  Group B strep. This is a bacteria that is commonly found inside  a woman's vagina. This test will inform your health care provider if you need an antibiotic to reduce the amount of this bacteria in your body prior to labor and childbirth.  Ultrasound. Many pregnant women undergo an  ultrasound screening around 18-20 weeks of pregnancy to evaluate the health of the fetus and check for any developmental abnormalities.  HIV (human immunodeficiency virus) testing. Early in your pregnancy, you will be screened for HIV. If you are at high risk for HIV, this test may be repeated during your third trimester of pregnancy. You may be offered other testing based on your age, personal or family medical history, or other factors.  HOW OFTEN SHOULD I PLAN TO SEE MY HEALTH CARE PROVIDER FOR PRENATAL CARE? Your prenatal care check-up schedule depends on any medical conditions you have before, or develop during, your pregnancy. If you do not have any underlying medical conditions, you will likely be seen for checkups:  Monthly, during the first 6 months of pregnancy.  Twice a month during months 7 and 8 of pregnancy.  Weekly starting in the 9th month of pregnancy and until delivery. If you develop signs of early labor or other concerning signs or symptoms, you may need to see your health care provider more often. Ask your health care provider what prenatal care schedule is best for you. WHAT CAN I DO TO KEEP MYSELF AND MY BABY AS HEALTHY AS POSSIBLE DURING MY PREGNANCY?  Take a prenatal vitamin containing 400 micrograms (0.4 mg) of folic acid every day. Your health care provider may also ask you to take additional vitamins such as iodine, vitamin D, iron, copper, and zinc.  Take 1500-2000 mg of calcium daily starting at your 20th week of pregnancy until you deliver your baby.  Make sure you are up to date on your vaccinations. Unless directed otherwise by your health care provider:  You should receive a tetanus, diphtheria, and pertussis (Tdap) vaccination between the 27th and 36th week of your pregnancy, regardless of when your last Tdap immunization occurred. This helps protect your baby from whooping cough (pertussis) after he or she is born.  You should receive an annual inactivated  influenza vaccine (IIV) to help protect you and your baby from influenza. This can be done at any point during your pregnancy.  Eat a well-rounded diet that includes:  Fresh fruits and vegetables.  Lean proteins.  Calcium-rich foods such as milk, yogurt, hard cheeses, and dark, leafy greens.  Whole grain breads.  Do noteat seafood high in mercury, including:  Swordfish.  Tilefish.  Shark.  King mackerel.  More than 6 oz tuna per week.  Do not eat:  Raw or undercooked meats or eggs.  Unpasteurized foods, such as soft cheeses (brie, blue, or feta), juices, and milks.  Lunch meats.  Hot dogs that have not been heated until they are steaming.  Drink enough water to keep your urine clear or pale yellow. For many women, this may be 10 or more 8 oz glasses of water each day. Keeping yourself hydrated helps deliver nutrients to your baby and may prevent the start of pre-term uterine contractions.  Do not use any tobacco products including cigarettes, chewing tobacco, or electronic cigarettes. If you need help quitting, ask your health care provider.  Do not drink beverages containing alcohol. No safe level of alcohol consumption during pregnancy has been determined.  Do not use any illegal drugs. These can harm your developing baby or cause a miscarriage.  Ask your health  care provider or pharmacist before taking any prescription or over-the-counter medicines, herbs, or supplements.  Limit your caffeine intake to no more than 200 mg per day.  Exercise. Unless told otherwise by your health care provider, try to get 30 minutes of moderate exercise most days of the week. Do not  do high-impact activities, contact sports, or activities with a high risk of falling, such as horseback riding or downhill skiing.  Get plenty of rest.  Avoid anything that raises your body temperature, such as hot tubs and saunas.  If you own a cat, do not empty its litter box. Bacteria contained in  cat feces can cause an infection called toxoplasmosis. This can result in serious harm to the fetus.  Stay away from chemicals such as insecticides, lead, mercury, and cleaning or paint products that contain solvents.  Do not have any X-rays taken unless medically necessary.  Take a childbirth and breastfeeding preparation class. Ask your health care provider if you need a referral or recommendation.   This information is not intended to replace advice given to you by your health care provider. Make sure you discuss any questions you have with your health care provider.   Document Released: 12/09/2003 Document Revised: 12/27/2014 Document Reviewed: 02/20/2014 Elsevier Interactive Patient Education [employer]

## 2016-02-19 ENCOUNTER — Encounter: Payer: Medicaid Other | Admitting: Obstetrics & Gynecology

## 2016-02-19 NOTE — Telephone Encounter (Signed)
No answer, lm for rtc 

## 2016-02-23 NOTE — Telephone Encounter (Signed)
Transition Care Management Follow-up Telephone Call   Date discharged?3/2 or 3/3   How have you been since you were released from the hospital? Fine, except my breathing bothers me, but i dont need anything   Do you understand why you were in the hospital? pneumonia   Do you understand the discharge instructions? yes   Where were you discharged to? home   Items Reviewed:  Medications reviewed: yes  Allergies reviewed: yes  Dietary changes reviewed: yes  Referrals reviewed: yes   Functional Questionnaire:   Activities of Daily Living (ADLs):   She states they are independent in the following: self States they require assistance with the following: self   Any transportation issues/concerns?: no   Any patient concerns? no   Confirmed importance and date/time of follow-up visits scheduled yes  Provider Appointment booked with  Confirmed with patient if condition begins to worsen call PCP or go to the ER.  Patient was given the office number and encouraged to call back with question or concerns.  : yes

## 2016-02-26 ENCOUNTER — Other Ambulatory Visit (HOSPITAL_COMMUNITY)
Admission: RE | Admit: 2016-02-26 | Discharge: 2016-02-26 | Disposition: A | Discharge status: Home / Self Care | Payer: Medicaid Other | Source: Ambulatory Visit | Attending: Family | Admitting: Family

## 2016-02-26 ENCOUNTER — Ambulatory Visit: Payer: Medicaid Other | Admitting: Internal Medicine

## 2016-02-26 ENCOUNTER — Encounter: Payer: Self-pay | Admitting: General Practice

## 2016-02-26 ENCOUNTER — Ambulatory Visit (INDEPENDENT_AMBULATORY_CARE_PROVIDER_SITE_OTHER): Payer: Medicaid Other | Admitting: Family

## 2016-02-26 ENCOUNTER — Encounter: Payer: Self-pay | Admitting: Family

## 2016-02-26 VITALS — BP 127/81 | HR 87 | Temp 97.5°F | Wt 262.5 lb

## 2016-02-26 DIAGNOSIS — O30002 Twin pregnancy, unspecified number of placenta and unspecified number of amniotic sacs, second trimester: Secondary | ICD-10-CM

## 2016-02-26 DIAGNOSIS — Z01411 Encounter for gynecological examination (general) (routine) with abnormal findings: Secondary | ICD-10-CM | POA: Insufficient documentation

## 2016-02-26 DIAGNOSIS — Z1151 Encounter for screening for human papillomavirus (HPV): Secondary | ICD-10-CM | POA: Insufficient documentation

## 2016-02-26 DIAGNOSIS — O10912 Unspecified pre-existing hypertension complicating pregnancy, second trimester: Secondary | ICD-10-CM

## 2016-02-26 DIAGNOSIS — O10919 Unspecified pre-existing hypertension complicating pregnancy, unspecified trimester: Secondary | ICD-10-CM | POA: Insufficient documentation

## 2016-02-26 DIAGNOSIS — O0990 Supervision of high risk pregnancy, unspecified, unspecified trimester: Secondary | ICD-10-CM | POA: Insufficient documentation

## 2016-02-26 DIAGNOSIS — Z363 Encounter for antenatal screening for malformations: Secondary | ICD-10-CM

## 2016-02-26 DIAGNOSIS — O34219 Maternal care for unspecified type scar from previous cesarean delivery: Secondary | ICD-10-CM

## 2016-02-26 DIAGNOSIS — Z113 Encounter for screening for infections with a predominantly sexual mode of transmission: Secondary | ICD-10-CM | POA: Diagnosis present

## 2016-02-26 DIAGNOSIS — Z124 Encounter for screening for malignant neoplasm of cervix: Secondary | ICD-10-CM

## 2016-02-26 DIAGNOSIS — O3112X0 Continuing pregnancy after spontaneous abortion of one fetus or more, second trimester, not applicable or unspecified: Secondary | ICD-10-CM

## 2016-02-26 DIAGNOSIS — IMO0002 Reserved for concepts with insufficient information to code with codable children: Secondary | ICD-10-CM

## 2016-02-26 DIAGNOSIS — O09892 Supervision of other high risk pregnancies, second trimester: Secondary | ICD-10-CM

## 2016-02-26 DIAGNOSIS — O0992 Supervision of high risk pregnancy, unspecified, second trimester: Secondary | ICD-10-CM

## 2016-02-26 DIAGNOSIS — E0500 Thyrotoxicosis with diffuse goiter without thyrotoxic crisis or storm: Secondary | ICD-10-CM

## 2016-02-26 LAB — POCT URINALYSIS DIP (DEVICE)
GLUCOSE, UA: NEGATIVE mg/dL
LEUKOCYTES UA: NEGATIVE
Nitrite: NEGATIVE
Protein, ur: >=300 mg/dL — AB
Urobilinogen, UA: 4 mg/dL — ABNORMAL HIGH (ref 0.0–1.0)
pH: 5.5 (ref 5.0–8.0)

## 2016-02-26 MED ORDER — ASPIRIN 81 MG PO CHEW: 81.0000 mg | CHEWABLE_TABLET | Freq: Every day | ORAL | Status: DC

## 2016-02-26 MED ORDER — BENZONATATE 100 MG PO CAPS: 200.0000 mg | ORAL_CAPSULE | Freq: Three times a day (TID) | ORAL | Status: DC | PRN

## 2016-02-26 NOTE — Addendum Note (Signed)
Addended by: Gerome Apley on: 02/26/2016 12:01 PM   Modules accepted: Orders

## 2016-02-26 NOTE — Patient Instructions (Addendum)
Second Trimester of Pregnancy The second trimester is from week 13 through week 28, months 4 through 6. The second trimester is often a time when you feel your best. Your body has also adjusted to being pregnant, and you begin to feel better physically. Usually, morning sickness has lessened or quit completely, you may have more energy, and you may have an increase in appetite. The second trimester is also a time when the fetus is growing rapidly. At the end of the sixth month, the fetus is about 9 inches long and weighs about 1 pounds. You will likely begin to feel the baby move (quickening) between 18 and 20 weeks of the pregnancy. BODY CHANGES Your body goes through many changes during pregnancy. The changes vary from woman to woman.   Your weight will continue to increase. You will notice your lower abdomen bulging out.  You may begin to get stretch marks on your hips, abdomen, and breasts.  You may develop headaches that can be relieved by medicines approved by your health care provider.  You may urinate more often because the fetus is pressing on your bladder.  You may develop or continue to have heartburn as a result of your pregnancy.  You may develop constipation because certain hormones are causing the muscles that push waste through your intestines to slow down.  You may develop hemorrhoids or swollen, bulging veins (varicose veins).  You may have back pain because of the weight gain and pregnancy hormones relaxing your joints between the bones in your pelvis and as a result of a shift in weight and the muscles that support your balance.  Your breasts will continue to grow and be tender.  Your gums may bleed and may be sensitive to brushing and flossing.  Dark spots or blotches (chloasma, mask of pregnancy) may develop on your face. This will likely fade after the baby is born.  A dark line from your belly button to the pubic area (linea nigra) may appear. This will likely  fade after the baby is born.  You may have changes in your hair. These can include thickening of your hair, rapid growth, and changes in texture. Some women also have hair loss during or after pregnancy, or hair that feels dry or thin. Your hair will most likely return to normal after your baby is born. WHAT TO EXPECT AT YOUR PRENATAL VISITS During a routine prenatal visit:  You will be weighed to make sure you and the fetus are growing normally.  Your blood pressure will be taken.  Your abdomen will be measured to track your baby's growth.  The fetal heartbeat will be listened to.  Any test results from the previous visit will be discussed. Your health care provider may ask you:  How you are feeling.  If you are feeling the baby move.  If you have had any abnormal symptoms, such as leaking fluid, bleeding, severe headaches, or abdominal cramping.  If you are using any tobacco products, including cigarettes, chewing tobacco, and electronic cigarettes.  If you have any questions. Other tests that may be performed during your second trimester include:  Blood tests that check for:  Low iron levels (anemia).  Gestational diabetes (between 24 and 28 weeks).  Rh antibodies.  Urine tests to check for infections, diabetes, or protein in the urine.  An ultrasound to confirm the proper growth and development of the baby.  An amniocentesis to check for possible genetic problems.  Fetal screens for spina bifida   and Down syndrome.  HIV (human immunodeficiency virus) testing. Routine prenatal testing includes screening for HIV, unless you choose not to have this test. HOME CARE INSTRUCTIONS   Avoid all smoking, herbs, alcohol, and unprescribed drugs. These chemicals affect the formation and growth of the baby.  Do not use any tobacco products, including cigarettes, chewing tobacco, and electronic cigarettes. If you need help quitting, ask your health care provider. You may receive  counseling support and other resources to help you quit.  Follow your health care provider's instructions regarding medicine use. There are medicines that are either safe or unsafe to take during pregnancy.  Exercise only as directed by your health care provider. Experiencing uterine cramps is a good sign to stop exercising.  Continue to eat regular, healthy meals.  Wear a good support bra for breast tenderness.  Do not use hot tubs, steam rooms, or saunas.  Wear your seat belt at all times when driving.  Avoid raw meat, uncooked cheese, cat litter boxes, and soil used by cats. These carry germs that can cause birth defects in the baby.  Take your prenatal vitamins.  Take 1500-2000 mg of calcium daily starting at the 20th week of pregnancy until you deliver your baby.  Try taking a stool softener (if your health care provider approves) if you develop constipation. Eat more high-fiber foods, such as fresh vegetables or fruit and whole grains. Drink plenty of fluids to keep your urine clear or pale yellow.  Take warm sitz baths to soothe any pain or discomfort caused by hemorrhoids. Use hemorrhoid cream if your health care provider approves.  If you develop varicose veins, wear support hose. Elevate your feet for 15 minutes, 3-4 times a day. Limit salt in your diet.  Avoid heavy lifting, wear low heel shoes, and practice good posture.  Rest with your legs elevated if you have leg cramps or low back pain.  Visit your dentist if you have not gone yet during your pregnancy. Use a soft toothbrush to brush your teeth and be gentle when you floss.  A sexual relationship may be continued unless your health care provider directs you otherwise.  Continue to go to all your prenatal visits as directed by your health care provider. SEEK MEDICAL CARE IF:   You have dizziness.  You have mild pelvic cramps, pelvic pressure, or nagging pain in the abdominal area.  You have persistent nausea,  vomiting, or diarrhea.  You have a bad smelling vaginal discharge.  You have pain with urination. SEEK IMMEDIATE MEDICAL CARE IF:   You have a fever.  You are leaking fluid from your vagina.  You have spotting or bleeding from your vagina.  You have severe abdominal cramping or pain.  You have rapid weight gain or loss.  You have shortness of breath with chest pain.  You notice sudden or extreme swelling of your face, hands, ankles, feet, or legs.  You have not felt your baby move in over an hour.  You have severe headaches that do not go away with medicine.  You have vision changes.   This information is not intended to replace advice given to you by your health care provider. Make sure you discuss any questions you have with your health care provider.   Document Released: 11/30/2001 Document Revised: 12/27/2014 Document Reviewed: 02/06/2013 Elsevier Interactive Patient Education 2016 [employer].  AREA PEDIATRIC/FAMILY PRACTICE PHYSICIANS  [facility] [address] [place], [place] [address] Phone - [phone]  Fax - [phone]  [facility] [address] [place], [place]  [address] Phone - [phone]   Fax - [phone]  [facility] [address] [place], [place]  [address] Phone - [phone]   Fax - [phone]  [facility] [address] [place], [place]  [address] Phone - [phone]   Fax - [phone]  [facility] [address] [place], [place]  [address] Phone - [phone]   Fax - [phone]  [facility] [address] [place], [place]  [address] Phone - [phone]   Fax - [phone]  [facility] [address] [place], [place]  [address] Phone - [phone]   Fax - [phone]  [facility] [address] [place], [place]   [address] Phone - [phone]   Fax - [phone]  [facility] [address] [place], [place]  [address] Phone - [phone]   Fax - [phone] [facility] [address] [place], [place]  [address] Phone - [phone]   Fax - [phone]  [facility] [address] [place], [place]  [address] Phone - [phone]   Fax - [phone]  [facility] [address] [place], [place]  [address] Phone - [phone]   Fax - [phone]  [facility] [address] [place], [place]  [address] Phone - [phone]   Fax - [phone]  [facility] [address] [place], [place]  [address] Phone - [phone]  [facility] [address] [place], [place]  [address] Phone - [phone]   Fax - [phone]  [facility] [address] [place], [place]  [address] Phone - [phone]   Fax - [phone]  [facility] [address] [place], [place]  [address] Phone - [phone]   Fax - [phone]  [facility] [address] [place], [place]  [address] Phone - [phone]   Fax - [phone]   [facility] [address] [place], [place]  [address] Phone - [phone]   Fax - [phone]  [facility] [address] [place], [place]  [address] Phone - [phone]   Fax - [phone]  [facility] [address] [place], [place]  [address] Phone - [phone]   Fax - [phone]  [facility] [address] , [place]  [address] Phone - [phone]   Fax - [phone]  [facility] [address] , [place]  [address] Phone - [phone]   Fax - [phone] [facility] [address] [place], [place]   [address] Phone - [phone]   Fax - [phone]  [facility] [address] [place], [place]  [address] Phone - [phone]   Fax - [phone]  [facility] [address] [place],   [address] Phone - [phone]   Fax - [phone]   

## 2016-02-26 NOTE — Progress Notes (Signed)
Subjective:    Tara [name] is a [id number] [redacted]w[redacted]d being seen today for her first obstetrical visit.  Her obstetrical history is significant for Graves disease, CHTN on labetalol, twin pregnancy with fetal demise and retention of twin A; recent hospitalization for pneumonia, and prior csection x 3.   Patient does intend to breast feed. Pregnancy history fully reviewed.  Patient reports coughing and intermittent shortness of breath.  Denies vaginal bleeding or cramping.  Denies chest pain.  Filed Vitals:   02/26/16 0815  BP: 127/81  Pulse: 87  Temp: 97.5 F (36.4 C)  Weight: 262 lb 8 oz (119.069 kg)    HISTORY: OB History  Gravida Para Term Preterm AB SAB TAB Ectopic Multiple Living  5 4 4  0 0 0 0 0 0 4    # Outcome Date GA Lbr Len/2nd Weight Sex Delivery Anes PTL Lv  5 Current           4 Term 08/14/12 [redacted]w[redacted]d  7 lb (3.175 kg) F CS-LTranv EPI N Y  3 Term 08/18/09 [redacted]w[redacted]d  6 lb 15 oz (3.147 kg) M CS-LTranv EPI N Y  2 Term 11/17/07 [redacted]w[redacted]d  6 lb 14 oz (3.118 kg) M CS-LTranv EPI N Y     Complications: Fetal Intolerance  1 Term 11/11/03 [redacted]w[redacted]d  4 lb 14 oz (2.211 kg) F Vag-Spont None Y Y     Past Medical History  Diagnosis Date  . Hypertension   . Obesity   . CAP (community acquired pneumonia) 02/13/2016  . Pneumonia "several times"  . Chronic bronchitis (HCC)   . Graves disease    Past Surgical History  Procedure Laterality Date  . Eye surgery Bilateral 2014    "for Graves Disease; eyelid retraction on left; decompression on the right""   History reviewed. No pertinent family history.   Exam     BP 127/81 mmHg  Pulse 87  Temp(Src) 97.5 F (36.4 C)  Wt 262 lb 8 oz (119.069 kg)  LMP 11/03/2015 (LMP Unknown) Uterine Size: size equals dates  Pelvic Exam:    Perineum: No Hemorrhoids, Normal Perineum   Vulva: normal   Vagina:  normal mucosa, normal discharge, no palpable nodules   pH: Not done   Cervix: no bleeding following Pap, no cervical motion tenderness and no  lesions   Adnexa: normal adnexa and no mass, fullness, tenderness   Bony Pelvis: Adequate  System: Breast:  No nipple retraction or dimpling, No nipple discharge or bleeding, No axillary or supraclavicular adenopathy, Normal to palpation without dominant masses   Skin: normal coloration and turgor, no rashes    Neurologic: negative   Extremities: normal strength, tone, and muscle mass   HEENT neck supple with midline trachea and thyroid without masses   Mouth/Teeth mucous membranes moist, pharynx normal without lesions   Neck supple and no masses   Cardiovascular: regular rate and rhythm, no murmurs or gallops   Respiratory:  appears well, vitals normal, no respiratory distress, acyanotic, normal RR, neck free of mass or lymphadenopathy, chest clear, no wheezing, crepitations, rhonchi, normal symmetric air entry; effort required to breathe; O2 sat 97%   Abdomen: soft, non-tender; bowel sounds normal; no masses,  no organomegaly   Urinary: urethral meatus normal      Assessment:    Pregnancy: [id number] Patient Active Problem List   Diagnosis Date Noted  . Supervision of high risk pregnancy, antepartum 02/26/2016  . Twin gestation in second trimester 02/26/2016  . Chronic hypertension during pregnancy,  antepartum 02/26/2016  . Previous cesarean delivery affecting pregnancy, antepartum 02/26/2016  . Pregnancy   . Ultrasound to check fetal anatomy and growth in twin pregnancy, antepartum   . Graves disease 02/11/2016  . Hypertension 02/11/2016  . CAP (community acquired pneumonia) 02/11/2016  . Acute respiratory failure with hypoxia (HCC) 02/11/2016  . Community acquired pneumonia   . Morbid obesity due to excess calories [facility])         Plan:     Initial labs drawn. Pap smear collected.  Baseline labs.  TSH and Free T3 and T4 Continue labetalol. Schedule appt with endocrinologist  Begin baby ASA. Continue thyroid meds Prenatal vitamins. Problem list reviewed and  updated. Genetic Screening discussed Quad Screen: ordered.  Ultrasound discussed; fetal survey: ordered. Consulted with Dr. Macon Large > agrees with plan of care  Follow up in 2 weeks.  Marlis Edelson 02/26/2016

## 2016-02-26 NOTE — Addendum Note (Signed)
Addended by: Cheree Ditto, DEMETRICE A on: 02/26/2016 04:47 PM   Modules accepted: Orders

## 2016-02-27 LAB — GC/CHLAMYDIA PROBE AMP (~~LOC~~) NOT AT ARMC
CHLAMYDIA, DNA PROBE: NEGATIVE
Neisseria Gonorrhea: NEGATIVE

## 2016-02-27 LAB — COMPREHENSIVE METABOLIC PANEL
ALBUMIN: 3.2 g/dL — AB (ref 3.6–5.1)
ALT: 14 U/L (ref 6–29)
AST: 20 U/L (ref 10–30)
Alkaline Phosphatase: 126 U/L — ABNORMAL HIGH (ref 33–115)
BUN: 6 mg/dL — ABNORMAL LOW (ref 7–25)
CHLORIDE: 103 mmol/L (ref 98–110)
CO2: 19 mmol/L — AB (ref 20–31)
Calcium: 8.8 mg/dL (ref 8.6–10.2)
Creat: 0.4 mg/dL — ABNORMAL LOW (ref 0.50–1.10)
Glucose, Bld: 125 mg/dL — ABNORMAL HIGH (ref 65–99)
Potassium: 3.6 mmol/L (ref 3.5–5.3)
SODIUM: 137 mmol/L (ref 135–146)
Total Bilirubin: 0.4 mg/dL (ref 0.2–1.2)
Total Protein: 6.4 g/dL (ref 6.1–8.1)

## 2016-02-27 LAB — GLUCOSE TOLERANCE, 1 HOUR (50G) W/O FASTING: Glucose, 1 Hr, gestational: 111 mg/dL (ref <140)

## 2016-02-27 LAB — CYTOLOGY - PAP

## 2016-02-27 LAB — T4, FREE: Free T4: 2.2 ng/dL — ABNORMAL HIGH (ref 0.8–1.8)

## 2016-02-27 LAB — TSH: TSH: 0.01 mIU/L — ABNORMAL LOW

## 2016-02-27 LAB — T3, FREE: T3 FREE: 6.4 pg/mL — AB (ref 2.3–4.2)

## 2016-02-28 LAB — PRENATAL PROFILE (SOLSTAS)
Antibody Screen: NEGATIVE
BASOS ABS: 0 10*3/uL (ref 0.0–0.1)
Basophils Relative: 0 % (ref 0–1)
EOS PCT: 2 % (ref 0–5)
Eosinophils Absolute: 0.2 10*3/uL (ref 0.0–0.7)
HCT: 30.1 % — ABNORMAL LOW (ref 36.0–46.0)
HEP B S AG: NEGATIVE
HIV: NONREACTIVE
Hemoglobin: 9.6 g/dL — ABNORMAL LOW (ref 12.0–15.0)
LYMPHS ABS: 1.4 10*3/uL (ref 0.7–4.0)
LYMPHS PCT: 17 % (ref 12–46)
MCH: 22 pg — AB (ref 26.0–34.0)
MCHC: 31.9 g/dL (ref 30.0–36.0)
MCV: 69 fL — AB (ref 78.0–100.0)
MPV: 9.7 fL (ref 8.6–12.4)
Monocytes Absolute: 0.5 10*3/uL (ref 0.1–1.0)
Monocytes Relative: 6 % (ref 3–12)
Neutro Abs: 6.2 10*3/uL (ref 1.7–7.7)
Neutrophils Relative %: 75 % (ref 43–77)
PLATELETS: 389 10*3/uL (ref 150–400)
RBC: 4.36 MIL/uL (ref 3.87–5.11)
RDW: 14.1 % (ref 11.5–15.5)
RH TYPE: POSITIVE
Rubella: 1.66 Index — ABNORMAL HIGH (ref <0.90)
WBC: 8.2 10*3/uL (ref 4.0–10.5)

## 2016-03-01 ENCOUNTER — Encounter: Payer: Self-pay | Admitting: Family

## 2016-03-01 ENCOUNTER — Other Ambulatory Visit: Payer: Self-pay | Admitting: Family

## 2016-03-01 DIAGNOSIS — O99019 Anemia complicating pregnancy, unspecified trimester: Secondary | ICD-10-CM | POA: Insufficient documentation

## 2016-03-01 LAB — HEMOGLOBINOPATHY EVALUATION
HGB F QUANT: 0.4 % (ref 0.0–2.0)
Hemoglobin Other: 0 %
Hgb A2 Quant: 3.1 % (ref 2.2–3.2)
Hgb A: 96.5 % — ABNORMAL LOW (ref 96.8–97.8)
Hgb S Quant: 0 %

## 2016-03-01 MED ORDER — FERROUS SULFATE 325 (65 FE) MG PO TABS: 325.0000 mg | ORAL_TABLET | Freq: Two times a day (BID) | ORAL | Status: DC

## 2016-03-01 NOTE — Progress Notes (Signed)
Pt called and informed regarding low hgb and RX for ferrous sulfate.  Also reviewed thyroid results; states taking medication better.

## 2016-03-02 ENCOUNTER — Ambulatory Visit (HOSPITAL_COMMUNITY)
Admission: RE | Admit: 2016-03-02 | Discharge: 2016-03-02 | Disposition: A | Discharge status: Home / Self Care | Payer: Medicaid Other | Source: Ambulatory Visit | Attending: Family | Admitting: Family

## 2016-03-02 ENCOUNTER — Encounter (HOSPITAL_COMMUNITY): Payer: Self-pay

## 2016-03-02 VITALS — BP 137/79 | HR 114

## 2016-03-02 DIAGNOSIS — O0992 Supervision of high risk pregnancy, unspecified, second trimester: Secondary | ICD-10-CM

## 2016-03-02 DIAGNOSIS — IMO0002 Reserved for concepts with insufficient information to code with codable children: Secondary | ICD-10-CM

## 2016-03-02 DIAGNOSIS — O10919 Unspecified pre-existing hypertension complicating pregnancy, unspecified trimester: Secondary | ICD-10-CM

## 2016-03-02 DIAGNOSIS — O34219 Maternal care for unspecified type scar from previous cesarean delivery: Secondary | ICD-10-CM

## 2016-03-02 DIAGNOSIS — Z363 Encounter for antenatal screening for malformations: Secondary | ICD-10-CM

## 2016-03-03 LAB — AFP, QUAD SCREEN
AFP: 45 ng/mL
Curr Gest Age: 14.2 wks.days
Down Syndrome Scr Risk Est: 1:6 {titer}
HCG TOTAL: 115.11 [IU]/mL
INH: 714.6 pg/mL
Interpretation-AFP: POSITIVE — AB
MOM FOR AFP: 2.22
MOM FOR HCG: 2.58
MoM for INH: 4.89
Open Spina bifida: NEGATIVE
Osb Risk: 1:895 {titer}
Tri 18 Scr Risk Est: NEGATIVE
UE3 VALUE: 0.1 ng/mL
uE3 Mom: 0.23

## 2016-03-05 ENCOUNTER — Telehealth: Payer: Self-pay | Admitting: *Deleted

## 2016-03-05 ENCOUNTER — Telehealth: Payer: Self-pay | Admitting: Family

## 2016-03-05 DIAGNOSIS — IMO0002 Reserved for concepts with insufficient information to code with codable children: Secondary | ICD-10-CM

## 2016-03-05 DIAGNOSIS — O280 Abnormal hematological finding on antenatal screening of mother: Secondary | ICD-10-CM

## 2016-03-05 NOTE — Telephone Encounter (Signed)
Upon further review of chart, noted that EDD was changed after patient discharge and QUAD drawn too early.  Pt notified.

## 2016-03-05 NOTE — Telephone Encounter (Signed)
Opened in error

## 2016-03-05 NOTE — Telephone Encounter (Signed)
Left message, called patient to inform regarding +Quad screen; referral to genetic counseling due to needing additional information regarding the twin pregnancy (fetal demise of twin A)  and possible impact on results.

## 2016-03-09 ENCOUNTER — Ambulatory Visit (INDEPENDENT_AMBULATORY_CARE_PROVIDER_SITE_OTHER): Payer: Medicaid Other | Admitting: Obstetrics and Gynecology

## 2016-03-09 ENCOUNTER — Telehealth: Payer: Self-pay | Admitting: *Deleted

## 2016-03-09 VITALS — BP 151/84 | HR 77 | Temp 98.1°F | Wt 255.2 lb

## 2016-03-09 DIAGNOSIS — O34219 Maternal care for unspecified type scar from previous cesarean delivery: Secondary | ICD-10-CM

## 2016-03-09 DIAGNOSIS — O10919 Unspecified pre-existing hypertension complicating pregnancy, unspecified trimester: Secondary | ICD-10-CM

## 2016-03-09 DIAGNOSIS — O4692 Antepartum hemorrhage, unspecified, second trimester: Secondary | ICD-10-CM

## 2016-03-09 DIAGNOSIS — O0992 Supervision of high risk pregnancy, unspecified, second trimester: Secondary | ICD-10-CM

## 2016-03-09 DIAGNOSIS — O10912 Unspecified pre-existing hypertension complicating pregnancy, second trimester: Secondary | ICD-10-CM

## 2016-03-09 DIAGNOSIS — E0500 Thyrotoxicosis with diffuse goiter without thyrotoxic crisis or storm: Secondary | ICD-10-CM

## 2016-03-09 LAB — POCT URINALYSIS DIP (DEVICE)
Glucose, UA: NEGATIVE mg/dL
Ketones, ur: 15 mg/dL — AB
Leukocytes, UA: NEGATIVE
NITRITE: NEGATIVE
PH: 6 (ref 5.0–8.0)
Specific Gravity, Urine: >=1.03 (ref 1.005–1.030)
Urobilinogen, UA: >=8 mg/dL (ref 0.0–1.0)

## 2016-03-09 NOTE — Addendum Note (Signed)
Addended by: Sherre Lain A on: 03/09/2016 04:21 PM   Modules accepted: Orders

## 2016-03-09 NOTE — Patient Instructions (Signed)

## 2016-03-09 NOTE — Progress Notes (Signed)
Subjective:  Tara [name] is a 25 y.o. [id number] at [redacted]w[redacted]d being seen today for ongoing prenatal care.  She is currently monitored for the following issues for this high-risk pregnancy and has Graves disease; Hypertension; Acute respiratory failure with hypoxia (HCC); Morbid obesity due to excess calories (HCC); Ultrasound to check fetal anatomy and growth in twin pregnancy, antepartum; Pregnancy; Supervision of high risk pregnancy, antepartum; Twin pregnancy with fetal loss and retention of one fetus, antepartum; Chronic hypertension during pregnancy, antepartum; Previous cesarean delivery affecting pregnancy, antepartum; and Anemia of mother in pregnancy, antepartum on her problem list.  Patient reports bleeding and lower abdominal cramping for 2 days, worse today. Red spotting at 5 am and again at 12 noon, a small clot. . No antecedent intercourse or irritative vaginal discharge.  Couple confused about whether twins both living. [place] at [redacted]w[redacted]d showed twin demise of other twin at [redacted]w[redacted]d, no cardiac activity. .    Movement: Present. Denies leaking of fluid.   The following portions of the patient's history were reviewed and updated as appropriate: allergies, current medications, past family history, past medical history, past social history, past surgical history and problem list. Problem list updated.  Objective:   Filed Vitals:   03/09/16 1532  BP: 151/84  Pulse: 77  Temp: 98.1 F (36.7 C)  Weight: 255 lb 3.2 oz (115.758 kg)    Fetal Status: Fetal Heart Rate (bpm): 164   Movement: Present     General:  Alert, oriented and cooperative. Patient is in no acute distress.  Skin: Skin is warm and dry. No rash noted.   Cardiovascular: Normal heart rate noted  Respiratory: Normal respiratory effort, no problems with respiration noted  Abdomen: Soft, gravid, appropriate for gestational age. Pain/Pressure: Present     Pelvic:       Cervical exam performed        Extremities: Normal range of motion.   Edema: None  Mental Status: Normal mood and affect. Normal behavior. Normal judgment and thought content.  SSE: no blood, white discharge.  SVE: cx posterior, long, closed, high  Urinalysis: Results for orders placed or performed in visit on 03/09/16 (from the past 24 hour(s))  POCT urinalysis dip (device)     Status: Abnormal   Collection Time: 03/09/16  3:06 PM  Result Value Ref Range   Glucose, UA NEGATIVE NEGATIVE mg/dL   Bilirubin Urine LARGE (A) NEGATIVE   Ketones, ur 15 (A) NEGATIVE mg/dL   Specific Gravity, Urine >=1.030 1.005 - 1.030   Hgb urine dipstick MODERATE (A) NEGATIVE   pH 6.0 5.0 - 8.0   Protein, ur >=300 (A) NEGATIVE mg/dL   Urobilinogen, UA >=8.2 0.0 - 1.0 mg/dL   Nitrite NEGATIVE NEGATIVE   Leukocytes, UA NEGATIVE NEGATIVE      Reviewed [place] reports with couple.  Assessment and Plan:  Pregnancy: G5P4004 at [redacted]w[redacted]d  Supervision of high risk pregnancy, antepartum, second trimester  Chronic hypertension during pregnancy, antepartum  Graves disease  Previous cesarean delivery affecting pregnancy, antepartum  Second trimester bleeding - Plan: WET PREP FOR TRICH, YEAST, CLUE, Culture, OB Urine   Preterm labor symptoms and general obstetric precautions including but not limited to vaginal bleeding, contractions, leaking of fluid and fetal movement were reviewed in detail with the patient. Please refer to After Visit Summary for other counseling recommendations.  Return in about 2 days (around 03/11/2016). Pelvic rest until next visit. If no further bleeding or cramping will call us to reschedule for next week.  Danae Orleans, CNM

## 2016-03-09 NOTE — Telephone Encounter (Signed)
Pt came to clinic. Pt to be worked in to see provider today.

## 2016-03-09 NOTE — Telephone Encounter (Signed)
Patient called and stated that she is experiencing cramping and some bleeding. She is currently [redacted] weeks pregnant. She would like someone to call her back to discuss this. Called patient and left voicemail to call us back.

## 2016-03-10 ENCOUNTER — Other Ambulatory Visit: Payer: Self-pay

## 2016-03-10 LAB — CULTURE, OB URINE
Colony Count: NO GROWTH
Organism ID, Bacteria: NO GROWTH

## 2016-03-10 LAB — WET PREP, GENITAL
TRICH WET PREP: NONE SEEN
Yeast Wet Prep HPF POC: NONE SEEN

## 2016-03-11 ENCOUNTER — Ambulatory Visit (INDEPENDENT_AMBULATORY_CARE_PROVIDER_SITE_OTHER): Payer: Medicaid Other | Admitting: Obstetrics & Gynecology

## 2016-03-11 ENCOUNTER — Telehealth: Payer: Self-pay

## 2016-03-11 VITALS — BP 140/92 | HR 97 | Temp 97.8°F | Wt 253.0 lb

## 2016-03-11 DIAGNOSIS — O99012 Anemia complicating pregnancy, second trimester: Secondary | ICD-10-CM | POA: Diagnosis not present

## 2016-03-11 DIAGNOSIS — O99282 Endocrine, nutritional and metabolic diseases complicating pregnancy, second trimester: Secondary | ICD-10-CM | POA: Diagnosis not present

## 2016-03-11 DIAGNOSIS — O99212 Obesity complicating pregnancy, second trimester: Secondary | ICD-10-CM | POA: Diagnosis not present

## 2016-03-11 DIAGNOSIS — O3680X1 Pregnancy with inconclusive fetal viability, fetus 1: Secondary | ICD-10-CM

## 2016-03-11 DIAGNOSIS — IMO0002 Reserved for concepts with insufficient information to code with codable children: Secondary | ICD-10-CM

## 2016-03-11 DIAGNOSIS — O0992 Supervision of high risk pregnancy, unspecified, second trimester: Secondary | ICD-10-CM

## 2016-03-11 DIAGNOSIS — D649 Anemia, unspecified: Secondary | ICD-10-CM

## 2016-03-11 DIAGNOSIS — Z36 Encounter for antenatal screening of mother: Secondary | ICD-10-CM | POA: Diagnosis not present

## 2016-03-11 DIAGNOSIS — O10912 Unspecified pre-existing hypertension complicating pregnancy, second trimester: Secondary | ICD-10-CM

## 2016-03-11 DIAGNOSIS — O3110X2 Continuing pregnancy after spontaneous abortion of one fetus or more, unspecified trimester, fetus 2: Secondary | ICD-10-CM

## 2016-03-11 DIAGNOSIS — E0500 Thyrotoxicosis with diffuse goiter without thyrotoxic crisis or storm: Secondary | ICD-10-CM

## 2016-03-11 LAB — POCT URINALYSIS DIP (DEVICE)
Glucose, UA: 100 mg/dL — AB
Ketones, ur: NEGATIVE mg/dL
LEUKOCYTES UA: NEGATIVE
NITRITE: NEGATIVE
PH: 6 (ref 5.0–8.0)
Protein, ur: >=300 mg/dL — AB
Specific Gravity, Urine: 1.025 (ref 1.005–1.030)
Urobilinogen, UA: 4 mg/dL — ABNORMAL HIGH (ref 0.0–1.0)

## 2016-03-11 NOTE — Progress Notes (Signed)
Bedside US for viability = FHR - 154 per PW doppler; FM present.

## 2016-03-11 NOTE — Progress Notes (Signed)
Pt reports lots of pressure in vaginal area. Pt states " sometimes I feel as if something is going to fall out of my vagina."  Pt also voices concern of loosing weight.   137/88 second BP reading after .

## 2016-03-11 NOTE — Telephone Encounter (Signed)
I attempted to call patient who did not answer the phone and voicemail is full at this time.   BV: please treat Flagyl 500 bid x 7d

## 2016-03-11 NOTE — Patient Instructions (Signed)

## 2016-03-11 NOTE — Telephone Encounter (Signed)
      BV: please treat Flagyl 500 bid x 7d   I attempted to call patient but voicemail is full at this time.

## 2016-03-11 NOTE — Progress Notes (Signed)
Subjective:  Tara [name] is a 25 y.o. [id number] at [redacted]w[redacted]d being seen today for ongoing prenatal care.  She is currently monitored for the following issues for this high-risk pregnancy and has Graves disease; Hypertension; Acute respiratory failure with hypoxia (HCC); Morbid obesity due to excess calories (HCC); Pregnancy; Supervision of high risk pregnancy, antepartum; Twin pregnancy with fetal loss and retention of one fetus, antepartum; Chronic hypertension during pregnancy, antepartum; Previous cesarean delivery affecting pregnancy, antepartum; Anemia of mother in pregnancy, antepartum; and Second trimester bleeding on her problem list.  Patient reports no complaints.  Contractions: Not present. Vag. Bleeding: None.  Movement: Present. Denies leaking of fluid.   The following portions of the patient's history were reviewed and updated as appropriate: allergies, current medications, past family history, past medical history, past social history, past surgical history and problem list. Problem list updated.  Objective:   Filed Vitals:   03/11/16 1100  BP: 140/92  Pulse: 97  Temp: 97.8 F (36.6 C)  Weight: 253 lb (114.76 kg)    Fetal Status:     Movement: Present     General:  Alert, oriented and cooperative. Patient is in no acute distress.  Skin: Skin is warm and dry. No rash noted.   Cardiovascular: Normal heart rate noted  Respiratory: Normal respiratory effort, no problems with respiration noted  Abdomen: Soft, gravid, appropriate for gestational age. Pain/Pressure: Present     Pelvic: Vag. Bleeding: None     Cervical exam deferred        Extremities: Normal range of motion.     Mental Status: Normal mood and affect. Normal behavior. Normal judgment and thought content.   Urinalysis:      Assessment and Plan:  Pregnancy: G5P4004 at [redacted]w[redacted]d  1. Supervision of high risk pregnancy, antepartum, second trimester fht by [place]  2. Twin pregnancy with fetal loss and retention of one  fetus, antepartum   3. Graves disease On medication  Preterm labor symptoms and general obstetric precautions including but not limited to vaginal bleeding, contractions, leaking of fluid and fetal movement were reviewed in detail with the patient. Please refer to After Visit Summary for other counseling recommendations.  2 weeks [place] 2 weeks  Adam Phenix, MD

## 2016-03-12 ENCOUNTER — Telehealth: Payer: Self-pay

## 2016-03-12 LAB — PRESCRIPTION MONITORING PROFILE (19 PANEL)
Amphetamine/Meth: NEGATIVE ng/mL
BARBITURATE SCREEN, URINE: NEGATIVE ng/mL
Benzodiazepine Screen, Urine: NEGATIVE ng/mL
Buprenorphine, Urine: NEGATIVE ng/mL
CARISOPRODOL, URINE: NEGATIVE ng/mL
COCAINE METABOLITES: NEGATIVE ng/mL
CREATININE, URINE: 230.17 mg/dL (ref >20.0)
Cannabinoid Scrn, Ur: NEGATIVE ng/mL
ECSTASY: NEGATIVE ng/mL
Fentanyl, Ur: NEGATIVE ng/mL
Meperidine, Ur: NEGATIVE ng/mL
Methadone Screen, Urine: NEGATIVE ng/mL
Methaqualone: NEGATIVE ng/mL
NITRITES URINE, INITIAL: NEGATIVE ug/mL
OPIATE SCREEN, URINE: NEGATIVE ng/mL
Oxycodone Screen, Ur: NEGATIVE ng/mL
PH URINE, INITIAL: 6.2 pH (ref 4.5–8.9)
PROPOXYPHENE: NEGATIVE ng/mL
Phencyclidine, Ur: NEGATIVE ng/mL
TAPENTADOLUR: NEGATIVE ng/mL
TRAMADOL UR: NEGATIVE ng/mL
ZOLPIDEM, URINE: NEGATIVE ng/mL

## 2016-03-12 NOTE — Telephone Encounter (Signed)
Second attempt to reach patient with no answer

## 2016-03-12 NOTE — Telephone Encounter (Signed)
Error

## 2016-03-17 ENCOUNTER — Encounter (HOSPITAL_COMMUNITY): Payer: Self-pay | Admitting: Family

## 2016-03-17 NOTE — Telephone Encounter (Signed)
Left message for patient to return call regarding lab work.  

## 2016-03-23 NOTE — Telephone Encounter (Signed)
No answer left another voicemail for patient to call us back regarding U/S

## 2016-03-25 ENCOUNTER — Encounter: Payer: Medicaid Other | Admitting: Obstetrics & Gynecology

## 2016-03-30 ENCOUNTER — Ambulatory Visit (HOSPITAL_COMMUNITY)
Admission: RE | Admit: 2016-03-30 | Discharge: 2016-03-30 | Disposition: A | Discharge status: Home / Self Care | Payer: Medicaid Other | Source: Ambulatory Visit | Attending: Family | Admitting: Family

## 2016-03-30 ENCOUNTER — Encounter (HOSPITAL_COMMUNITY): Payer: Self-pay

## 2016-03-30 ENCOUNTER — Other Ambulatory Visit: Payer: Self-pay | Admitting: Family

## 2016-03-30 VITALS — BP 132/79 | HR 109 | Wt 254.0 lb

## 2016-03-30 DIAGNOSIS — Z3A19 19 weeks gestation of pregnancy: Secondary | ICD-10-CM | POA: Diagnosis not present

## 2016-03-30 DIAGNOSIS — E079 Disorder of thyroid, unspecified: Secondary | ICD-10-CM | POA: Insufficient documentation

## 2016-03-30 DIAGNOSIS — Z315 Encounter for genetic counseling: Secondary | ICD-10-CM | POA: Diagnosis not present

## 2016-03-30 DIAGNOSIS — O99210 Obesity complicating pregnancy, unspecified trimester: Secondary | ICD-10-CM

## 2016-03-30 DIAGNOSIS — O10012 Pre-existing essential hypertension complicating pregnancy, second trimester: Secondary | ICD-10-CM | POA: Diagnosis not present

## 2016-03-30 DIAGNOSIS — O99282 Endocrine, nutritional and metabolic diseases complicating pregnancy, second trimester: Secondary | ICD-10-CM

## 2016-03-30 DIAGNOSIS — O281 Abnormal biochemical finding on antenatal screening of mother: Secondary | ICD-10-CM | POA: Insufficient documentation

## 2016-03-30 DIAGNOSIS — O0992 Supervision of high risk pregnancy, unspecified, second trimester: Secondary | ICD-10-CM

## 2016-03-30 DIAGNOSIS — O34219 Maternal care for unspecified type scar from previous cesarean delivery: Secondary | ICD-10-CM

## 2016-03-30 DIAGNOSIS — Z363 Encounter for antenatal screening for malformations: Secondary | ICD-10-CM

## 2016-03-30 DIAGNOSIS — E0590 Thyrotoxicosis, unspecified without thyrotoxic crisis or storm: Secondary | ICD-10-CM

## 2016-03-30 DIAGNOSIS — O99212 Obesity complicating pregnancy, second trimester: Secondary | ICD-10-CM | POA: Diagnosis not present

## 2016-03-30 DIAGNOSIS — IMO0002 Reserved for concepts with insufficient information to code with codable children: Secondary | ICD-10-CM

## 2016-03-30 DIAGNOSIS — O10919 Unspecified pre-existing hypertension complicating pregnancy, unspecified trimester: Secondary | ICD-10-CM

## 2016-03-30 DIAGNOSIS — O280 Abnormal hematological finding on antenatal screening of mother: Secondary | ICD-10-CM | POA: Insufficient documentation

## 2016-03-30 NOTE — Progress Notes (Signed)
Genetic Counseling  High-Risk Gestation Note  Appointment Date:  03/30/2016 Referred By: Marlis Edelson, CNM Date of Birth:  09/12/1991 Partner:  Tara [name]   Pregnancy History: [id number] Estimated Date of Delivery: 08/24/16 Estimated Gestational Age: [redacted]w[redacted]d Attending: Berna Spare, MD   [name] and her partner, Mr. Tara [name], were seen for genetic counseling because of an increased risk for fetal Down syndrome based on Quad screen through [facility].  In Summary:   1 in 6 Down syndrome risk from Quad screen, drawn at [redacted] weeks gestation  Ultrasound at approximately [redacted] weeks gestation noted demise of one twin (measuring approximately [redacted]w[redacted]d)  Fetal demise in pregnancy has been shown to impact maternal serum values measured on Quad screen; Thus, the Quad screen result is not likely an accurate risk assessment for Down syndrome for the pregnancy  Detailed ultrasound performed today within normal limits  Discussed additional available screening for Down syndrome  Patient elected to pursue NIPS, returning on 03/31/16 for blood draw  Patient understands that data suggests placental cell free DNA typically is cleared from maternal clearance by 8 weeks following the demise of one twin in pregnancy but that this data is limited and could impact performance and results of NIPS  She declined amniocentesis at this time but may consider this pending NIPS results  They were counseled regarding the Quad screen result and the associated 1 in 6 risk for fetal Down syndrome.  We reviewed chromosomes, nondisjunction, and the common features and variable prognosis of Down syndrome.  In addition, we reviewed the screen adjusted reduction in risks for trisomy 18 and ONTDs.  We also discussed other explanations for a screen positive result including: a gestational dating error, differences in maternal metabolism, and normal variation. The pregnancy was initially a twin gestation, with  demise of one twin noted at approximately [redacted] weeks gestation (measuring approximately 7 weeks). Given that the Quad screen was drawn at [redacted] weeks gestation, we discussed that the maternal serum analytes were likely impacted by the demise of one twin and thus is not an accurate risk assessment for Down syndrome in the pregnancy. They understand that this screening is not diagnostic for Down syndrome but provides a risk assessment.  We reviewed available screening options including noninvasive prenatal screening (NIPS)/cell free fetal DNA (cffDNA) testing, and detailed ultrasound.  Detailed ultrasound was performed today and was within normal range. Complete utlrasound results reported separately. They understand the benefits and limitations of ultrasound as a screening tool for aneuploidy. She were counseled that screening tests are used to modify a patient's a priori risk for aneuploidy, typically based on age. This estimate provides a pregnancy specific risk assessment. We reviewed the benefits and limitations of each option. Specifically, we discussed the conditions for which NIPS assesses, the detection rates, and false positive rates of each. We discussed that data is limited regarding performed of NIPS following a vanishing twin in pregnancy but available data suggests that the cell free fragments are cleared from maternal circulation by approximately 8 weeks after the fetal loss in an ongoing pregnancy. They were also counseled regarding diagnostic testing via amniocentesis. We reviewed the approximate 1 in 300-500 risk for complications for amniocentesis, including spontaneous pregnancy loss. We discussed the possible results that the tests might provide including: positive, negative, unanticipated, and no result. Finally, they were counseled regarding the cost of each option and potential out of pocket expenses. Tara [name] elected to pursue NIPS ([facility] [place]), returning 03/31/16 for blood  draw.  Pending results of this test, she may consider pursuing amniocentesis.   They understand that screening tests cannot rule out all birth defects or genetic syndromes. The patient was advised of this limitation and states she still does not want additional testing at this time.   Tara [name] was provided with written information regarding sickle cell anemia (SCA) including the carrier frequency and incidence in the African-American population, the availability of carrier testing and prenatal diagnosis if indicated.  In addition, we discussed that hemoglobinopathies are routinely screened for as part of the [place] newborn screening panel.  She previously had hemoglobin electrophoresis within normal limits.   Both family histories were reviewed and found to be contributory for unilateral post-lingual hearing loss for the patient's maternal grandfather and maternal grandmother. She had limited information regarding the underlying cause(s). No additional relatives were reported with hearing loss.  We discussed that genes, environment, and multiple factors can contribute to hearing loss. Without further information regarding the provided family history, an accurate genetic risk cannot be calculated. Further genetic counseling is warranted if more information is obtained.  Tara [name] denied exposure to environmental toxins or chemical agents. She denied the use of alcohol, tobacco or street drugs. She denied significant viral illnesses during the course of her pregnancy. Her medical and surgical histories were contributory for hypertension and Graves disease, for which she is treated with medication.   I counseled this couple for approximately 40 minutes regarding the above risks and available options. Most of the counseling was provided by Oda Kilts, [employer] genetic counseling student, under my direct supervision.   Tara Plowman, MS,  Certified Genetic Counselor 03/30/2016

## 2016-03-31 ENCOUNTER — Ambulatory Visit (HOSPITAL_COMMUNITY): Admission: RE | Admit: 2016-03-31 | Payer: Medicaid Other | Source: Ambulatory Visit

## 2016-04-01 ENCOUNTER — Other Ambulatory Visit (HOSPITAL_COMMUNITY): Payer: Self-pay | Admitting: *Deleted

## 2016-04-01 DIAGNOSIS — O281 Abnormal biochemical finding on antenatal screening of mother: Secondary | ICD-10-CM

## 2016-04-08 ENCOUNTER — Ambulatory Visit (HOSPITAL_COMMUNITY): Payer: Medicaid Other

## 2016-04-08 ENCOUNTER — Encounter: Payer: Medicaid Other | Admitting: Family

## 2016-04-12 ENCOUNTER — Emergency Department (HOSPITAL_COMMUNITY): Payer: Medicaid Other

## 2016-04-12 ENCOUNTER — Encounter (HOSPITAL_COMMUNITY): Payer: Self-pay | Admitting: *Deleted

## 2016-04-12 DIAGNOSIS — E0500 Thyrotoxicosis with diffuse goiter without thyrotoxic crisis or storm: Principal | ICD-10-CM | POA: Diagnosis present

## 2016-04-12 DIAGNOSIS — R809 Proteinuria, unspecified: Secondary | ICD-10-CM | POA: Diagnosis present

## 2016-04-12 DIAGNOSIS — Z6841 Body Mass Index (BMI) 40.0 and over, adult: Secondary | ICD-10-CM

## 2016-04-12 DIAGNOSIS — Z91128 Patient's intentional underdosing of medication regimen for other reason: Secondary | ICD-10-CM

## 2016-04-12 DIAGNOSIS — D509 Iron deficiency anemia, unspecified: Secondary | ICD-10-CM | POA: Diagnosis present

## 2016-04-12 DIAGNOSIS — I5023 Acute on chronic systolic (congestive) heart failure: Secondary | ICD-10-CM | POA: Diagnosis present

## 2016-04-12 DIAGNOSIS — J42 Unspecified chronic bronchitis: Secondary | ICD-10-CM | POA: Diagnosis present

## 2016-04-12 DIAGNOSIS — O99342 Other mental disorders complicating pregnancy, second trimester: Secondary | ICD-10-CM | POA: Diagnosis present

## 2016-04-12 DIAGNOSIS — F064 Anxiety disorder due to known physiological condition: Secondary | ICD-10-CM | POA: Diagnosis present

## 2016-04-12 DIAGNOSIS — Z3A22 22 weeks gestation of pregnancy: Secondary | ICD-10-CM

## 2016-04-12 DIAGNOSIS — T382X6A Underdosing of antithyroid drugs, initial encounter: Secondary | ICD-10-CM | POA: Diagnosis present

## 2016-04-12 DIAGNOSIS — R0902 Hypoxemia: Secondary | ICD-10-CM | POA: Diagnosis present

## 2016-04-12 DIAGNOSIS — O10112 Pre-existing hypertensive heart disease complicating pregnancy, second trimester: Secondary | ICD-10-CM | POA: Diagnosis present

## 2016-04-12 DIAGNOSIS — I110 Hypertensive heart disease with heart failure: Secondary | ICD-10-CM | POA: Diagnosis present

## 2016-04-12 DIAGNOSIS — Z9114 Patient's other noncompliance with medication regimen: Secondary | ICD-10-CM

## 2016-04-12 DIAGNOSIS — T448X6A Underdosing of centrally-acting and adrenergic-neuron-blocking agents, initial encounter: Secondary | ICD-10-CM | POA: Diagnosis present

## 2016-04-12 DIAGNOSIS — E876 Hypokalemia: Secondary | ICD-10-CM | POA: Diagnosis present

## 2016-04-12 DIAGNOSIS — Z91013 Allergy to seafood: Secondary | ICD-10-CM

## 2016-04-12 DIAGNOSIS — O99012 Anemia complicating pregnancy, second trimester: Secondary | ICD-10-CM | POA: Diagnosis present

## 2016-04-12 DIAGNOSIS — Z91041 Radiographic dye allergy status: Secondary | ICD-10-CM

## 2016-04-12 DIAGNOSIS — O99512 Diseases of the respiratory system complicating pregnancy, second trimester: Secondary | ICD-10-CM | POA: Diagnosis present

## 2016-04-12 DIAGNOSIS — O99212 Obesity complicating pregnancy, second trimester: Secondary | ICD-10-CM | POA: Diagnosis present

## 2016-04-12 MED ORDER — ALBUTEROL SULFATE (2.5 MG/3ML) 0.083% IN NEBU
5.0000 mg | INHALATION_SOLUTION | Freq: Once | RESPIRATORY_TRACT | Status: AC
  Administered 2016-04-12: 5 mg via RESPIRATORY_TRACT
  Filled 2016-04-12: qty 6

## 2016-04-12 MED ORDER — IPRATROPIUM BROMIDE 0.02 % IN SOLN
0.5000 mg | Freq: Once | RESPIRATORY_TRACT | Status: AC
  Administered 2016-04-12: 0.5 mg via RESPIRATORY_TRACT
  Filled 2016-04-12: qty 2.5

## 2016-04-12 NOTE — ED Notes (Signed)
The pt has been coughing and vomiting with the cough since march.   She had a doctors appointment but missed it  She was told that she could not go back to work until she has been seen.  Some sob with exertion  Dx pneumonia infeb  No better.  Productive cough green thick  lmp  Nov 14th [redacted] weeks pregnant

## 2016-04-12 NOTE — ED Notes (Addendum)
Pt discussed with dr Verdie Mosher  hhn chest xray discussed and ordered

## 2016-04-12 NOTE — ED Provider Notes (Signed)
CSN: [id number]     Arrival date & time 04/12/16  2009 History  By signing my name below, I, Bethel Born, attest that this documentation has been prepared under the direction and in the presence of Devoria Albe, MD at 23:48 PM Electronically Signed: Bethel Born, ED Scribe. 04/13/2016. 2:43 AM   Chief Complaint  Patient presents with  . Cough   The history is provided by the patient. No language interpreter was used.   Tara [name] is a 25 y.o. female who is approximately [redacted] weeks pregnant with history of HTN, Graves disease,  obesity, and chronic bronchitis who presents to the Emergency Department complaining of an ongoing and progressively worsening cough productive of green sputum with onset last month. Pt states that she was diagnosed with pneumonia on 02/11/16 and admitted to the hospital. She was discharged with oxygen and a course of abx that she completed but her symptoms returned 2 weeks later.  Associated symptoms include post-tussive emesis and exertional SOB. Her home oxygen has not been providing any relief. Pt states that she was sent home from work today because her breathing was loud and she could not stop coughing.  The breathing treatment that she received in the ED tonight has not provided much relief.  Pt denies fever, sore throat, rhinorrhea, chest pain, chest tightness, wheezing, and LE swelling. She was last seen by her OB/GYN on 03/30/16 and discussed her symptoms with her provider at that time. She has noted fetal movement. G3P2A0.Pt is [redacted] weeks pregnant  Pt works at [employer] and does not smoke. Her Graves eye disease was diagnosed in 2012 but she is not currently being treated for it due to an insurance issue. She is out of methimazole and labetalol.   PCP [facility]  Past Medical History  Diagnosis Date  . Hypertension   . Obesity   . CAP (community acquired pneumonia) 02/13/2016  . Pneumonia "several times"  . Chronic bronchitis (HCC)   . Graves  disease    Past Surgical History  Procedure Laterality Date  . Eye surgery Bilateral 2014    "for Graves Disease; eyelid retraction on left; decompression on the right""   No family history on file. Social History  Substance Use Topics  . Smoking status: Never Smoker   . Smokeless tobacco: Never Used  . Alcohol Use: No   employed  OB History    Gravida Para Term Preterm AB TAB SAB Ectopic Multiple Living   5 4 4  0 0 0 0 0 0 4     Review of Systems  Constitutional: Negative for fever.  HENT: Negative for rhinorrhea and sore throat.   Respiratory: Positive for cough and shortness of breath. Negative for chest tightness and wheezing.   Cardiovascular: Negative for chest pain and leg swelling.  Gastrointestinal: Positive for vomiting. Negative for nausea.  Neurological: Tremors:    All other systems reviewed and are negative.  Allergies  Iodine and Shellfish allergy  Home Medications   Prior to Admission medications   Medication Sig Start Date End Date Taking? Authorizing Provider  aspirin 81 MG chewable tablet Chew 1 tablet (81 mg total) by mouth daily. Patient not taking: Reported on 03/09/2016 02/26/16   Marlis Edelson, CNM  benzonatate (TESSALON PERLES) 100 MG capsule Take 2 capsules (200 mg total) by mouth 3 (three) times daily as needed for cough. Patient not taking: Reported on 03/09/2016 02/26/16   Marlis Edelson, CNM  ferrous sulfate (FERROUSUL) 325 (65 FE)  MG tablet Take 1 tablet (325 mg total) by mouth 2 (two) times daily. Patient not taking: Reported on 03/30/2016 03/01/16   Marlis Edelson, CNM  labetalol (NORMODYNE) 300 MG tablet Take 1 tablet (300 mg total) by mouth 2 (two) times daily. Patient not taking: Reported on 04/13/2016 02/18/16   Darreld Mclean, MD  methimazole (TAPAZOLE) 5 MG tablet Take 1 tablet (5 mg total) by mouth 2 (two) times daily. Patient not taking: Reported on 04/13/2016 02/18/16   Darreld Mclean, MD  Prenatal Vit-Fe Fumarate-FA (GOODSENSE PRENATAL  VITAMINS) 28-0.8 MG TABS Take 1 tablet by mouth daily. Patient not taking: Reported on 04/13/2016 12/12/15   Zadie Rhine, MD   BP 132/59 mmHg  Pulse 106  Temp(Src) 98 F (36.7 C) (Oral)  Resp 18  Ht  (1.676 m)  Wt 253 lb (114.76 kg)  BMI 40.85 kg/m2  SpO2 99%  LMP 11/03/2015 (LMP Unknown)  Vital signs normal except for tachycardia  Physical Exam  Constitutional: She is oriented to person, place, and time. She appears well-developed and well-nourished.  Non-toxic appearance. She does not appear ill. No distress.  HENT:  Head: Normocephalic and atraumatic.  Right Ear: External ear normal.  Left Ear: External ear normal.  Nose: Nose normal. No mucosal edema or rhinorrhea.  Mouth/Throat: Oropharynx is clear and moist and mucous membranes are normal. No dental abscesses or uvula swelling.  Eyes: Conjunctivae and EOM are normal. Pupils are equal, round, and reactive to light.  Proptotic right eye  Neck: Normal range of motion and full passive range of motion without pain. Neck supple.  Thyromegaly   Cardiovascular: Regular rhythm and normal heart sounds.  Tachycardia present.  Exam reveals no gallop and no friction rub.   No murmur heard. Pulmonary/Chest: No respiratory distress. She has no wheezes. She has no rhonchi. She has no rales. She exhibits no tenderness and no crepitus.  Short or breath while talking Diminished breath sounds   Abdominal: Soft. Normal appearance and bowel sounds are normal. She exhibits no distension. There is no tenderness. There is no rebound and no guarding.  Musculoskeletal: Normal range of motion. She exhibits no edema or tenderness.  Moves all extremities well.   Neurological: She is alert and oriented to person, place, and time. She has normal strength. No cranial nerve deficit.  Skin: Skin is warm, dry and intact. No rash noted. No erythema. No pallor.  Psychiatric: She has a normal mood and affect. Her speech is normal and behavior is normal.  Her mood appears not anxious.  Nursing note and vitals reviewed.   ED Course  Procedures (including critical care time)  Medications  potassium chloride SA (K-DUR,KLOR-CON) CR tablet 40 mEq (not administered)  albuterol (PROVENTIL) (2.5 MG/3ML) 0.083% nebulizer solution 5 mg (5 mg Nebulization Given 04/12/16 2030)  ipratropium (ATROVENT) nebulizer solution 0.5 mg (0.5 mg Nebulization Given 04/12/16 2030)  methylPREDNISolone sodium succinate (SOLU-MEDROL) 125 mg/2 mL injection 125 mg (125 mg Intravenous Given 04/13/16 0634)  diphenhydrAMINE (BENADRYL) injection 50 mg (50 mg Intravenous Given 04/13/16 0635)  iopamidol (ISOVUE-370) 76 % injection (100 mLs  Contrast Given 04/13/16 0814)    DIAGNOSTIC STUDIES: Oxygen Saturation is 99% on RA,  normal by my interpretation.    COORDINATION OF CARE: 11:58 PM Discussed treatment plan which includes lab work, CXR, EKG, with pt at bedside and pt agreed to plan.  2:40 AM I re-evaluated the patient and provided an update on the results of her EKG.   FHR 154  Recheck 5:30 AM patient was ambulated by nursing staff. Her pulse ox only dropped to 93% however she got very tachypneic. Patient states she had an episode where her monitor went off and her heart rate was 160. When I go back to review her monitor she did have an episode at 5:05 AM with her heart rate over 150.  She had multiple alarms for high respiratory rate. Patient's lungs were reexamined. They remain clear. At this point we discussed that she is at increased risk for a blood clot. She is agreeable to doing a CT scan.  CT angio done, but delayed due to contrast allergy.  Pt left at change of shift at 8:45 AM with Dr Jeraldine Loots to get results of her CT scan.    Labs Review Results for orders placed or performed during the hospital encounter of 04/13/16  Comprehensive metabolic panel  Result Value Ref Range   Sodium 135 135 - 145 mmol/L   Potassium 2.9 (L) 3.5 - 5.1 mmol/L   Chloride 105 101  - 111 mmol/L   CO2 18 (L) 22 - 32 mmol/L   Glucose, Bld 103 (H) 65 - 99 mg/dL   BUN <5 (L) 6 - 20 mg/dL   Creatinine, Ser 1.61 (L) 0.44 - 1.00 mg/dL   Calcium 8.8 (L) 8.9 - 10.3 mg/dL   Total Protein 6.8 6.5 - 8.1 g/dL   Albumin 2.9 (L) 3.5 - 5.0 g/dL   AST 19 15 - 41 U/L   ALT 13 (L) 14 - 54 U/L   Alkaline Phosphatase 95 38 - 126 U/L   Total Bilirubin 0.4 0.3 - 1.2 mg/dL   GFR calc non Af Amer >60 >60 mL/min   GFR calc Af Amer >60 >60 mL/min   Anion gap 12 5 - 15  CBC with Differential  Result Value Ref Range   WBC 9.3 4.0 - 10.5 K/uL   RBC 4.77 3.87 - 5.11 MIL/uL   Hemoglobin 10.3 (L) 12.0 - 15.0 g/dL   HCT 09.6 (L) 04.5 - 40.9 %   MCV 67.9 (L) 78.0 - 100.0 fL   MCH 21.6 (L) 26.0 - 34.0 pg   MCHC 31.8 30.0 - 36.0 g/dL   RDW 81.1 91.4 - 78.2 %   Platelets 253 150 - 400 K/uL   Neutrophils Relative % 63 %   Lymphocytes Relative 28 %   Monocytes Relative 8 %   Eosinophils Relative 1 %   Basophils Relative 0 %   Neutro Abs 5.9 1.7 - 7.7 K/uL   Lymphs Abs 2.6 0.7 - 4.0 K/uL   Monocytes Absolute 0.7 0.1 - 1.0 K/uL   Eosinophils Absolute 0.1 0.0 - 0.7 K/uL   Basophils Absolute 0.0 0.0 - 0.1 K/uL   Smear Review MORPHOLOGY UNREMARKABLE   Brain natriuretic peptide  Result Value Ref Range   B Natriuretic Peptide 70.2 0.0 - 100.0 pg/mL  D-dimer, quantitative  Result Value Ref Range   D-Dimer, Quant 0.60 (H) 0.00 - 0.50 ug/mL-FEU    Laboratory interpretation all normal except anemia, mildly elevated d-dimer, hypokalemia   Dg Chest 2 View  04/12/2016  CLINICAL DATA:  Patient with recent history of pneumonia. Persistent symptoms. EXAM: CHEST  2 VIEW COMPARISON:  Chest radiograph 02/09/2016 FINDINGS: Stable enlarged cardiac and mediastinal contours. Interval improvement in previously described bilateral consolidative pulmonary opacities. No large pleural effusion or pneumothorax. Regional skeleton is unremarkable. IMPRESSION: Cardiomegaly. Significant interval improvement and near  complete resolution of previously described multi focal pulmonary opacities. Electronically  Signed   By: Annia Belt M.D.   On: 04/12/2016 22:33   [place] Mfm Ob Detail +14 Wk  03/30/2016  OBSTETRICAL ULTRASOUND: This exam was performed within a  Ultrasound Department. The OB US report was generated in the [facility] system, and faxed to the ordering physician.  This report is available in the [employer] [employer]. See the [facility] Obstetric US report via the Image Link.   I have personally reviewed and evaluated these images and lab results as part of my medical decision-making.   EKG Interpretation   Date/Time:  Tuesday April 13 2016 00:22:03 EDT Ventricular Rate:  106 PR Interval:  140 QRS Duration: 96 QT Interval:  360 QTC Calculation: 478 R Axis:   1 Text Interpretation:  Sinus tachycardia Incomplete right bundle branch  block Since last tracing 11 Feb 2016 T wave abnormality, consider anterior  ischemia Confirmed by Braxen Dobek  MD-I, Jaidee Stipe ([id number]) on 04/13/2016 12:24:18 AM      MDM   Final diagnoses:  Dyspnea  Tachycardia  Hyperthyroidism  Noncompliance  Pregnant    Disposition pending  Devoria Albe, MD, FACEP    I personally performed the services described in this documentation, which was scribed in my presence. The recorded information has been reviewed and considered.  Devoria Albe, MD, Concha Pyo, MD 04/13/16 [phone]

## 2016-04-13 ENCOUNTER — Emergency Department (HOSPITAL_COMMUNITY): Payer: Medicaid Other

## 2016-04-13 ENCOUNTER — Inpatient Hospital Stay (HOSPITAL_COMMUNITY)
Admission: EM | Admit: 2016-04-13 | Discharge: 2016-04-21 | DRG: 643 | Disposition: A | Discharge status: Home / Self Care | Payer: Medicaid Other | Attending: Internal Medicine | Admitting: Internal Medicine

## 2016-04-13 ENCOUNTER — Encounter (HOSPITAL_COMMUNITY): Payer: Self-pay | Admitting: *Deleted

## 2016-04-13 DIAGNOSIS — E876 Hypokalemia: Secondary | ICD-10-CM

## 2016-04-13 DIAGNOSIS — R0902 Hypoxemia: Secondary | ICD-10-CM | POA: Insufficient documentation

## 2016-04-13 DIAGNOSIS — I10 Essential (primary) hypertension: Secondary | ICD-10-CM

## 2016-04-13 DIAGNOSIS — IMO0002 Reserved for concepts with insufficient information to code with codable children: Secondary | ICD-10-CM

## 2016-04-13 DIAGNOSIS — Z91148 Patient's other noncompliance with medication regimen for other reason: Secondary | ICD-10-CM | POA: Insufficient documentation

## 2016-04-13 DIAGNOSIS — O280 Abnormal hematological finding on antenatal screening of mother: Secondary | ICD-10-CM

## 2016-04-13 DIAGNOSIS — E0590 Thyrotoxicosis, unspecified without thyrotoxic crisis or storm: Secondary | ICD-10-CM | POA: Diagnosis not present

## 2016-04-13 DIAGNOSIS — J189 Pneumonia, unspecified organism: Secondary | ICD-10-CM | POA: Diagnosis present

## 2016-04-13 DIAGNOSIS — Z3490 Encounter for supervision of normal pregnancy, unspecified, unspecified trimester: Secondary | ICD-10-CM | POA: Insufficient documentation

## 2016-04-13 DIAGNOSIS — O10919 Unspecified pre-existing hypertension complicating pregnancy, unspecified trimester: Secondary | ICD-10-CM

## 2016-04-13 DIAGNOSIS — O99280 Endocrine, nutritional and metabolic diseases complicating pregnancy, unspecified trimester: Secondary | ICD-10-CM

## 2016-04-13 DIAGNOSIS — O34219 Maternal care for unspecified type scar from previous cesarean delivery: Secondary | ICD-10-CM

## 2016-04-13 DIAGNOSIS — I5023 Acute on chronic systolic (congestive) heart failure: Secondary | ICD-10-CM | POA: Insufficient documentation

## 2016-04-13 DIAGNOSIS — Z91199 Patient's noncompliance with other medical treatment and regimen due to unspecified reason: Secondary | ICD-10-CM

## 2016-04-13 DIAGNOSIS — R000 Tachycardia, unspecified: Secondary | ICD-10-CM | POA: Insufficient documentation

## 2016-04-13 DIAGNOSIS — R0600 Dyspnea, unspecified: Secondary | ICD-10-CM | POA: Diagnosis not present

## 2016-04-13 DIAGNOSIS — O0992 Supervision of high risk pregnancy, unspecified, second trimester: Secondary | ICD-10-CM

## 2016-04-13 DIAGNOSIS — E8779 Other fluid overload: Secondary | ICD-10-CM | POA: Insufficient documentation

## 2016-04-13 DIAGNOSIS — R0602 Shortness of breath: Secondary | ICD-10-CM

## 2016-04-13 DIAGNOSIS — Z9119 Patient's noncompliance with other medical treatment and regimen: Secondary | ICD-10-CM

## 2016-04-13 DIAGNOSIS — J810 Acute pulmonary edema: Secondary | ICD-10-CM | POA: Insufficient documentation

## 2016-04-13 DIAGNOSIS — O99012 Anemia complicating pregnancy, second trimester: Secondary | ICD-10-CM

## 2016-04-13 DIAGNOSIS — Z9114 Patient's other noncompliance with medication regimen: Secondary | ICD-10-CM | POA: Insufficient documentation

## 2016-04-13 HISTORY — DX: Headache, unspecified: R51.9

## 2016-04-13 HISTORY — DX: Headache: R51

## 2016-04-13 HISTORY — DX: Anemia, unspecified: D64.9

## 2016-04-13 LAB — TSH: TSH: 0.049 u[IU]/mL — ABNORMAL LOW (ref 0.350–4.500)

## 2016-04-13 LAB — CBC WITH DIFFERENTIAL/PLATELET
Basophils Absolute: 0 10*3/uL (ref 0.0–0.1)
Basophils Relative: 0 %
EOS ABS: 0.1 10*3/uL (ref 0.0–0.7)
Eosinophils Relative: 1 %
HEMATOCRIT: 32.4 % — AB (ref 36.0–46.0)
HEMOGLOBIN: 10.3 g/dL — AB (ref 12.0–15.0)
LYMPHS PCT: 28 %
Lymphs Abs: 2.6 10*3/uL (ref 0.7–4.0)
MCH: 21.6 pg — AB (ref 26.0–34.0)
MCHC: 31.8 g/dL (ref 30.0–36.0)
MCV: 67.9 fL — ABNORMAL LOW (ref 78.0–100.0)
MONOS PCT: 8 %
Monocytes Absolute: 0.7 10*3/uL (ref 0.1–1.0)
Neutro Abs: 5.9 10*3/uL (ref 1.7–7.7)
Neutrophils Relative %: 63 %
Platelets: 253 10*3/uL (ref 150–400)
RBC: 4.77 MIL/uL (ref 3.87–5.11)
RDW: 14.4 % (ref 11.5–15.5)
WBC: 9.3 10*3/uL (ref 4.0–10.5)

## 2016-04-13 LAB — CREATININE CLEARANCE, URINE, 24 HOUR
CREATININE 24H UR: UNDETERMINED mg/d (ref 600–1800)
Creatinine Clearance: UNDETERMINED mL/min (ref 75–115)
Creatinine, Urine: UNDETERMINED mg/dL

## 2016-04-13 LAB — URINALYSIS, ROUTINE W REFLEX MICROSCOPIC
Bilirubin Urine: NEGATIVE
GLUCOSE, UA: 250 mg/dL — AB
KETONES UR: NEGATIVE mg/dL
LEUKOCYTES UA: NEGATIVE
Nitrite: NEGATIVE
PH: 6 (ref 5.0–8.0)
Protein, ur: 100 mg/dL — AB
Specific Gravity, Urine: >1.046 — ABNORMAL HIGH (ref 1.005–1.030)

## 2016-04-13 LAB — COMPREHENSIVE METABOLIC PANEL
ALBUMIN: 2.9 g/dL — AB (ref 3.5–5.0)
ALK PHOS: 95 U/L (ref 38–126)
ALT: 13 U/L — ABNORMAL LOW (ref 14–54)
ANION GAP: 12 (ref 5–15)
AST: 19 U/L (ref 15–41)
BILIRUBIN TOTAL: 0.4 mg/dL (ref 0.3–1.2)
BUN: <5 mg/dL — ABNORMAL LOW (ref 6–20)
CALCIUM: 8.8 mg/dL — AB (ref 8.9–10.3)
CO2: 18 mmol/L — ABNORMAL LOW (ref 22–32)
Chloride: 105 mmol/L (ref 101–111)
Creatinine, Ser: 0.37 mg/dL — ABNORMAL LOW (ref 0.44–1.00)
Glucose, Bld: 103 mg/dL — ABNORMAL HIGH (ref 65–99)
POTASSIUM: 2.9 mmol/L — AB (ref 3.5–5.1)
Sodium: 135 mmol/L (ref 135–145)
TOTAL PROTEIN: 6.8 g/dL (ref 6.5–8.1)

## 2016-04-13 LAB — PROTEIN, URINE, RANDOM: Total Protein, Urine: 71 mg/dL

## 2016-04-13 LAB — PROTEIN, URINE, 24 HOUR

## 2016-04-13 LAB — MAGNESIUM: MAGNESIUM: 1.6 mg/dL — AB (ref 1.7–2.4)

## 2016-04-13 LAB — URINE MICROSCOPIC-ADD ON

## 2016-04-13 LAB — T4, FREE: Free T4: 4.39 ng/dL — ABNORMAL HIGH (ref 0.61–1.12)

## 2016-04-13 LAB — INFLUENZA PANEL BY PCR (TYPE A & B)
H1N1 flu by pcr: NOT DETECTED
INFLBPCR: NEGATIVE
Influenza A By PCR: NEGATIVE

## 2016-04-13 LAB — D-DIMER, QUANTITATIVE: D-Dimer, Quant: 0.6 ug/mL-FEU — ABNORMAL HIGH (ref 0.00–0.50)

## 2016-04-13 LAB — PROCALCITONIN: Procalcitonin: <0.1 ng/mL

## 2016-04-13 LAB — BRAIN NATRIURETIC PEPTIDE: B NATRIURETIC PEPTIDE 5: 70.2 pg/mL (ref 0.0–100.0)

## 2016-04-13 MED ORDER — ONDANSETRON HCL 4 MG/2ML IJ SOLN
4.0000 mg | Freq: Three times a day (TID) | INTRAMUSCULAR | Status: DC | PRN
Start: 2016-04-13 — End: 2016-04-21
  Administered 2016-04-17: 4 mg via INTRAVENOUS
  Filled 2016-04-13: qty 2

## 2016-04-13 MED ORDER — POTASSIUM CHLORIDE CRYS ER 20 MEQ PO TBCR
40.0000 meq | EXTENDED_RELEASE_TABLET | Freq: Once | ORAL | Status: AC
  Administered 2016-04-13: 40 meq via ORAL
  Filled 2016-04-13: qty 2

## 2016-04-13 MED ORDER — METHIMAZOLE 5 MG PO TABS
5.0000 mg | ORAL_TABLET | Freq: Two times a day (BID) | ORAL | Status: DC
  Administered 2016-04-13 – 2016-04-14 (×3): 5 mg via ORAL
  Filled 2016-04-13 (×3): qty 1

## 2016-04-13 MED ORDER — SODIUM CHLORIDE 0.9% FLUSH
3.0000 mL | Freq: Two times a day (BID) | INTRAVENOUS | Status: DC
  Administered 2016-04-13 – 2016-04-20 (×13): 3 mL via INTRAVENOUS

## 2016-04-13 MED ORDER — ASPIRIN 81 MG PO CHEW
81.0000 mg | CHEWABLE_TABLET | Freq: Every day | ORAL | Status: DC
  Administered 2016-04-13 – 2016-04-21 (×9): 81 mg via ORAL
  Filled 2016-04-13 (×9): qty 1

## 2016-04-13 MED ORDER — AZITHROMYCIN 500 MG IV SOLR: 500.0000 mg | INTRAVENOUS | Status: DC

## 2016-04-13 MED ORDER — PRENATAL MULTIVITAMIN CH
1.0000 | ORAL_TABLET | Freq: Every day | ORAL | Status: DC
  Administered 2016-04-13 – 2016-04-21 (×9): 1 via ORAL
  Filled 2016-04-13 (×10): qty 1

## 2016-04-13 MED ORDER — METHYLPREDNISOLONE SODIUM SUCC 125 MG IJ SOLR
125.0000 mg | Freq: Once | INTRAMUSCULAR | Status: AC
  Administered 2016-04-13: 125 mg via INTRAVENOUS
  Filled 2016-04-13: qty 2

## 2016-04-13 MED ORDER — MAGNESIUM SULFATE 2 GM/50ML IV SOLN
2.0000 g | Freq: Once | INTRAVENOUS | Status: AC
  Administered 2016-04-13: 2 g via INTRAVENOUS
  Filled 2016-04-13: qty 50

## 2016-04-13 MED ORDER — DEXTROSE 5 % IV SOLN
2.0000 g | Freq: Once | INTRAVENOUS | Status: AC
  Administered 2016-04-13: 2 g via INTRAVENOUS
  Filled 2016-04-13: qty 2

## 2016-04-13 MED ORDER — DIPHENHYDRAMINE HCL 50 MG/ML IJ SOLN
50.0000 mg | Freq: Once | INTRAMUSCULAR | Status: AC
  Administered 2016-04-13: 50 mg via INTRAVENOUS
  Filled 2016-04-13: qty 1

## 2016-04-13 MED ORDER — LABETALOL HCL 200 MG PO TABS
300.0000 mg | ORAL_TABLET | Freq: Two times a day (BID) | ORAL | Status: DC
  Administered 2016-04-13 – 2016-04-16 (×7): 300 mg via ORAL
  Filled 2016-04-13 (×2): qty 2
  Filled 2016-04-13 (×5): qty 1

## 2016-04-13 MED ORDER — SODIUM CHLORIDE 0.9 % IV SOLN
INTRAVENOUS | Status: DC
  Administered 2016-04-13: 12:00:00 via INTRAVENOUS

## 2016-04-13 MED ORDER — CEFEPIME HCL 2 G IJ SOLR
2.0000 g | Freq: Three times a day (TID) | INTRAMUSCULAR | Status: DC
  Filled 2016-04-13: qty 2

## 2016-04-13 MED ORDER — ENOXAPARIN SODIUM 40 MG/0.4ML ~~LOC~~ SOLN
40.0000 mg | SUBCUTANEOUS | Status: DC
  Administered 2016-04-20: 40 mg via SUBCUTANEOUS
  Filled 2016-04-13 (×5): qty 0.4

## 2016-04-13 MED ORDER — FERROUS SULFATE 325 (65 FE) MG PO TABS
325.0000 mg | ORAL_TABLET | Freq: Every day | ORAL | Status: DC
  Administered 2016-04-14 – 2016-04-21 (×7): 325 mg via ORAL
  Filled 2016-04-13 (×9): qty 1

## 2016-04-13 MED ORDER — CEFTRIAXONE SODIUM 1 G IJ SOLR
1.0000 g | INTRAMUSCULAR | Status: DC
  Filled 2016-04-13: qty 10

## 2016-04-13 MED ORDER — VANCOMYCIN HCL IN DEXTROSE 1-5 GM/200ML-% IV SOLN: 1000.0000 mg | Freq: Three times a day (TID) | INTRAVENOUS | Status: DC

## 2016-04-13 MED ORDER — IOPAMIDOL (ISOVUE-370) INJECTION 76%
INTRAVENOUS | Status: AC
  Administered 2016-04-13: 100 mL
  Filled 2016-04-13: qty 100

## 2016-04-13 MED ORDER — GOODSENSE PRENATAL VITAMINS 28-0.8 MG PO TABS: 1.0000 | ORAL_TABLET | Freq: Every day | ORAL | Status: DC

## 2016-04-13 MED ORDER — VANCOMYCIN HCL 10 G IV SOLR
2000.0000 mg | Freq: Once | INTRAVENOUS | Status: DC
  Filled 2016-04-13: qty 2000

## 2016-04-13 NOTE — ED Notes (Signed)
Ambulated Pt with Pulse Ox. Pt started on the side of the bed with a Pulse Ox of 99%. While ambulating Pt from B16 to Pod C her O2 sat stayed between 97%-99%. While walking back to B16 Pt started Coughing and O2 dropped down to 93%. Helped pt back in Bed. Pt coughing more. RN Notified. Pt hooked back up to Monitor in Room.

## 2016-04-13 NOTE — ED Provider Notes (Signed)
9:09 AM Patient aware of all findings. She remains tachypneic Tara [name] but denies substantial ongoing other complaints beyond mild dyspnea. I discussed her case with our obstetric colleague (Dr. Shawnie Pons covering for Dr. Debroah Loop). Patient is appropriate for admission to our medical facility. Antibiotics started for pneumonia.   Gerhard Munch, MD 04/13/16 [phone]

## 2016-04-13 NOTE — Progress Notes (Signed)
[facility] called about patient's arrival to [facility] ED with complaints of SOB and cough that has worsened; patient states she was discharged from the hospital a few weeks ago after being admitted for pneumonia; patient is a g5p4 who is 66 and 0/[redacted] weeks along in her pregnancy; she denies any problems with this pregnancy or previous pregnancy; denies any complaints related to pregnancy such as bleeding and leaking of fluid; patient states she feels movement; dopplar performed resulted in a FHR in the 150s. Pt states she sees the outpatient clinic at [facility] for care; Dr Adrian Blackwater notified of patient's arrival, complaints, and test results thus far; informed of dopplar results; orders given to clear patient from [facility] care at this time

## 2016-04-13 NOTE — ED Notes (Signed)
CT notified that patient has been giving allergy meds at this time.

## 2016-04-13 NOTE — ED Notes (Signed)
OB RR called.

## 2016-04-13 NOTE — H&P (Signed)
Date: 04/13/2016               Patient Name:  Tara [name] MRN: [id number]  DOB: 1990/12/27 Age / Sex: 25 y.o., female   PCP: No Pcp Per Patient         Medical Service: Internal Medicine Teaching Service         Attending Physician: Dr. Earl Lagos, MD    First Contact: Dr. Earnest Conroy Pager: [phone]  Second Contact: Dr. Allena Katz Pager: [phone]       After Hours (After 5p/  First Contact Pager: [phone]  weekends / holidays): Second Contact Pager: [phone]   Chief Complaint: Shortness of breath, cough  History of Present Illness: Tara [name] is a 25 y.o.  female w/ PMHx of hyperthyroidism, non-compliant with BB and Methimazole, [redacted] weeks pregnant, presents to the ED w/ complaints of worsening SOB and productive cough. Patient was admitted to [facility] in 01/2016 with pneumonia and symptoms of hyperthyroidism at that time, treated with CAP coverage and discharged on Methimazole and Labetalol. She says that about a week afterwards, she started having continued SOB and cough. This AM, she states she was at work and had such bad SOB and cough productive of a very green sputum, that her work sent her home and instructed her to go to the hospital. She says she feels she has had somewhat of a fever recently, as well as nausea and vomiting. She denies diarrhea or abdominal discomfort. She also admits to some mild chest tightness and palpitations, but denies dizziness, lightheadedness.   Patient has not been taking her Methimazole or Labetalol for the past 1-2 weeks because she says she ran out and has not seen her endocrinologist or a medical doctor since her discharge in February.   In the ED, patient CXR showed improvement since her previous admission, however, d-dimer was checked which was elevated. Patient received CTA which was negative for PE but showed bibasilar infiltrates. BNP 70. Started on Vanc + Cefepime in the ED.    Meds: Current Facility-Administered Medications  Medication  Dose Route Frequency Provider Last Rate Last Dose  . azithromycin (ZITHROMAX) 500 mg in dextrose 5 % 250 mL IVPB  500 mg Intravenous Q24H Courtney Paris, MD      . cefTRIAXone (ROCEPHIN) 1 g in dextrose 5 % 50 mL IVPB  1 g Intravenous Q24H Courtney Paris, MD      . Melene Muller ON 04/14/2016] ferrous sulfate tablet 325 mg  325 mg Oral Q breakfast Courtney Paris, MD      . labetalol (NORMODYNE) tablet 300 mg  300 mg Oral BID Courtney Paris, MD      . magnesium sulfate IVPB 2 g 50 mL  2 g Intravenous Once Courtney Paris, MD      . methimazole (TAPAZOLE) tablet 5 mg  5 mg Oral BID Courtney Paris, MD      . ondansetron [facility]) injection 4 mg  4 mg Intravenous Q8H PRN Courtney Paris, MD      . prenatal multivitamin tablet 1 tablet  1 tablet Oral Q1200 Quenton Fetter, RPH      . sodium chloride flush (NS) 0.9 % injection 3 mL  3 mL Intravenous Q12H Courtney Paris, MD   3 mL at 04/13/16 1152    Allergies: Allergies as of 04/12/2016 - Review Complete 04/12/2016  Allergen Reaction Noted  . Iodine Hives 12/01/2015  . Shellfish allergy  02/11/2016  Past Medical History  Diagnosis Date  . Hypertension   . Obesity   . CAP (community acquired pneumonia) 02/13/2016  . Pneumonia "several times"  . Chronic bronchitis (HCC)   . Graves disease   . Graves disease   . Headache   . Anemia    Past Surgical History  Procedure Laterality Date  . Eye surgery Bilateral 2014    "for Graves Disease; eyelid retraction on left; decompression on the right""  . Cesarean section     History reviewed. No pertinent family history. Social History   Social History  . Marital Status: Single    Spouse Name: N/A  . Number of Children: N/A  . Years of Education: N/A   Occupational History  . Not on file.   Social History Main Topics  . Smoking status: Never Smoker   . Smokeless tobacco: Never Used  . Alcohol Use: No  . Drug Use: No  . Sexual Activity: Not Currently   Other Topics Concern  . Not on file   Social History  Narrative    Review of Systems: General: Positive for subjective fever, diaphoresis, and decreased appetite. Denies fatigue.  Respiratory: Positive for SOB/DOE, productive cough, and chest tightness. Denies wheezing.   Cardiovascular: Positive for palpitations. Denies chest pain.  Gastrointestinal: Positive for nausea and vomiting. Denies abdominal pain, diarrhea, constipation, blood in stool and abdominal distention.  Genitourinary: Denies dysuria, urgency, frequency, hematuria, and flank pain. Endocrine: Positive for heat intolerance. Denies cold intolerance, polyuria, and polydipsia. Musculoskeletal: Denies myalgias, back pain, joint swelling, arthralgias and gait problem.  Skin: Denies pallor, rash and wounds.  Neurological: Denies dizziness, seizures, syncope, weakness, lightheadedness, numbness and headaches.  Psychiatric/Behavioral: Denies mood changes, confusion, nervousness, sleep disturbance and agitation.  Physical Exam: Blood pressure 128/61, pulse 117, temperature 98.8 F (37.1 C), temperature source Oral, resp. rate 18, height 5\' 6"  (1.676 m), weight 248 lb 12.8 oz (112.855 kg), last menstrual period 11/03/2015, SpO2 100 %.  General: Obese AA female, alert, cooperative, appeared anxious on exam. Diaphoretic.  HEENT: PERRL, EOMI. Proptosis, R>L. Moist mucus membranes. Neck: Full range of motion without pain, supple, large goiter present, no lymphadenopathy or carotid bruits. Thyroid bruit.  Lungs: Air entry equal bilaterally. Few scattered rhonchi mostly in the bases. No rales or wheezes.  Heart: Tachycardic, 3/6 systolic ejection murmur heard in all areas. No gallops or rubs Abdomen: Soft, non-tender, mildly distended, BS + Extremities: No cyanosis, clubbing. Trace pitting edema.  Neurologic: Alert & oriented x3, cranial nerves II-XII intact, strength grossly intact, sensation intact to light touch   Lab results: Basic Metabolic Panel:  Recent Labs  33/61/22 0035  04/13/16 1030  NA 135  --   K 2.9*  --   CL 105  --   CO2 18*  --   GLUCOSE 103*  --   BUN <5*  --   CREATININE 0.37*  --   CALCIUM 8.8*  --   MG  --  1.6*   Liver Function Tests:  Recent Labs  04/13/16 0035  AST 19  ALT 13*  ALKPHOS 95  BILITOT 0.4  PROT 6.8  ALBUMIN 2.9*   CBC:  Recent Labs  04/13/16 0035  WBC 9.3  NEUTROABS 5.9  HGB 10.3*  HCT 32.4*  MCV 67.9*  PLT 253   BNP: 70.2  D-Dimer:  Recent Labs  04/13/16 0035  DDIMER 0.60*   Thyroid Function Tests:  Recent Labs  04/13/16 1030  TSH 0.049*  FREET4 4.39*  Urine Drug Screen: Drugs of Abuse     Component Value Date/Time   LABOPIA NEG 03/11/2016 1253   LABOPIA NONE DETECTED 02/11/2016 0942   COCAINSCRNUR NEG 03/11/2016 1253   COCAINSCRNUR NONE DETECTED 02/11/2016 0942   LABBENZ NEG 03/11/2016 1253   LABBENZ NONE DETECTED 02/11/2016 0942   AMPHETMU NEG 03/11/2016 1253   AMPHETMU NONE DETECTED 02/11/2016 0942   THCU NEG 03/11/2016 1253   THCU NONE DETECTED 02/11/2016 0942   LABBARB NEG 03/11/2016 1253   LABBARB NONE DETECTED 02/11/2016 0942    Urinalysis: No results for input(s): COLORURINE, LABSPEC, PHURINE, GLUCOSEU, HGBUR, BILIRUBINUR, KETONESUR, PROTEINUR, UROBILINOGEN, NITRITE, LEUKOCYTESUR in the last 72 hours.  Invalid input(s): APPERANCEUR  Imaging results:  Dg Chest 2 View  04/12/2016  CLINICAL DATA:  Patient with recent history of pneumonia. Persistent symptoms. EXAM: CHEST  2 VIEW COMPARISON:  Chest radiograph 02/09/2016 FINDINGS: Stable enlarged cardiac and mediastinal contours. Interval improvement in previously described bilateral consolidative pulmonary opacities. No large pleural effusion or pneumothorax. Regional skeleton is unremarkable. IMPRESSION: Cardiomegaly. Significant interval improvement and near complete resolution of previously described multi focal pulmonary opacities. Electronically Signed   By: Annia Belt M.D.   On: 04/12/2016 22:33   Ct Angio Chest  Pe W/cm &/or Wo Cm  04/13/2016  CLINICAL DATA:  Shortness of breath for 2 weeks. T kidney and tachycardia. Twenty-two weeks pregnant. EXAM: CT ANGIOGRAPHY CHEST WITH CONTRAST TECHNIQUE: Multidetector CT imaging of the chest was performed using the standard protocol during bolus administration of intravenous contrast. Multiplanar CT image reconstructions and MIPs were obtained to evaluate the vascular anatomy. CONTRAST:  100 mL Isovue 370 COMPARISON:  Chest radiographs 04/12/2016 FINDINGS: There is moderate motion artifact which limits assessment of the segmental and subsegmental pulmonary arteries. Within this limitation, no filling defects suggestive of pulmonary emboli are identified. There is mild main pulmonary arterial enlargement which can be seen in the setting of pulmonary arterial hypertension. The heart is again noted to be enlarged. The thoracic aorta is normal in caliber. The thyroid gland is diffusely enlarged, incompletely visualized though with evidence of mild transverse narrowing of the trachea. Small mediastinal lymph nodes measure up to approximately 1 cm in short axis in the right paratracheal and subcarinal stations, and there are subcentimeter hilar lymph nodes, most likely reactive. No pleural or pericardial effusion is identified, although the posterior costophrenic angles were incompletely imaged. Evaluation of the lung parenchyma is limited by motion artifact. There are patchy areas of ground-glass opacity throughout both lungs, greatest in the lower lobes. Small nodular opacities in the basilar right lower lobe measure up to 10 x 5 mm. Several small peripheral nodules in the right middle lobe measure up to 4 mm in size, likely infectious/inflammatory. The visualized portion of the upper abdomen is unremarkable. No acute osseous abnormality is identified. Review of the MIP images confirms the above findings. IMPRESSION: 1. Moderately motion degraded examination without pulmonary emboli  identified. 2. Ground-glass opacities throughout both lungs which may reflect pneumonia although pulmonary edema is also possible. 3. Cardiomegaly and pulmonary arterial enlargement. Electronically Signed   By: Sebastian Ache M.D.   On: 04/13/2016 08:53    Other results: EKG: Sinus tachycardia  Assessment & Plan by Problem:  25 y.o.  female w/ PMHx of hyperthyroidism, non-compliant with BB and Methimazole, [redacted] weeks pregnant, admitted for dyspnea and cough.   Dyspnea: Patient with subjective fever, shortness of breath, chest tightness, and cough productive of greenish sputum. Says this has been present since  a week after her discharge in February for pneumonia. CXR shows cardiomegaly and near complete resolution of scattered infiltrates seen during her previous admission. Given her persistent SOB in the setting of pregnancy, d-dimer was checked, obviously elevated, and CTA performed. Scan showed no PE but was significant for ground glass opacities centrally and mostly in the lung bases. Also suggestive of cardiomegaly and PA enlargement. BNP 70. May be viral pneumonia given bilateral appearance, although do not suspect HCAP pathogens and do not think coverage for these is necessary.  Also, patient not noted to have leukocytosis or fever on admission. PCT 0. Other things to consider would be pulmonary edema 2/2 hypertension and tachycardia related to her hyperthyroidism, and high output heart failure. Patient was noted to be hypertensive in the ED and tachycardic into the 120's. Another important consideration is pre-eclampsia related pulmonary edema. Patient is noted to have low albumin and may have decreased oncotic pressure as well as increased capillary permeability and increased pulmonary capillary hydrostatic pressures.  -Admit to telemetry -Discontinue ABx -Check flu -Control pressure, heart rate; restart Labetalol 300 mg bid + Tapazole 5 mg bid -Check UA; if protein, will order protein/cr  ratio -Stop fluids -Supplemental O2 -Strict intake and output, daily weights -Repeat CBC, renal function panel (includes albumin) in AM  Hyperthyroidism: Patient has been without Methimazole + Labetalol for 1-2 weeks. She appears visibly anxious (although she denies this), diaphoretic, short of breath, with visible goiter, proptosis, tachycardia, and hypertension. Audible thyroid bruit. No tenderness. Was discharged in 01/2016 with Methimazole 5 mg bid and Labetalol 300 mg bid. Repeat thyroid function studies show TSH of 0.049 and fT4 of 4.39, quite similar to herr recent admission. Suspect this is contributing to her clinical status currently, including her SOB. Discussed with OB/GYN, okay with continuing Methimazole despite pregnancy. -Restart Methimazole 5 mg bid + Labetalol 300 mg bid -Continue telemetry monitoring -Supplement electrolytes  HTN: Most likely related to above.  -Labetalol as above.   Hypokalemia: 2.9 on admission. Given 40 mEq in ED. Magnesium 1.6 -Give another K 40 mEq + Magnesium Sulfate 2 mg IV -Repeat BMP, magnesium in AM  Pregnancy: [redacted] weeks pregnant per patient. Was initially twins but lost one of them earlier in pregnancy. Seen by OB/GYN as recently as 03/30/16. Plan for amniocentesis because of reported concern for Down's per chart review.  -OB/GYN to follow for continued dopplers and heart rate monitoring -Continue Iron + prenatal vitamin  DVT/PE PPx: SCD's  Dispo: Disposition is deferred at this time, awaiting improvement of current medical problems. Anticipated discharge in approximately 1-2 day(s).   The patient does not have a current PCP (No Pcp Per Patient) and does need an [facility] hospital follow-up appointment after discharge.  The patient does not have transportation limitations that hinder transportation to clinic appointments.  Signed: Courtney Paris, MD 04/13/2016, 12:48 PM

## 2016-04-13 NOTE — ED Notes (Signed)
Spoke with Odessa Fleming GYN RN, the pt was seen this morning by OB GYN RN & the plan is to Doppler the Fetal HR once daily, OB GYN RN to be contacted if the pt is admitted

## 2016-04-13 NOTE — Progress Notes (Signed)
ED RN phoned with questions about frequency of doppler FHTs.  I reassured RN that we are aware of the patient and that the normal procedure for a patient of nonviable gestation is QD doppler heart rate. Requested RN to call [facility] RN if patient is to be admitted so that I can insure that her OB MDs are aware.

## 2016-04-13 NOTE — Progress Notes (Addendum)
Pharmacy Antibiotic Note  Tara [name] is a 25 y.o. female admitted on 04/13/2016 with pneumonia.  Pharmacy has been consulted for cefepime dosing. Admitting MD cancelled vanc, no doses given. Cefepime x 1 given in ED. AF, wbc wnl, SCr 0.37 on admit, normalized CrCl~96. No cultures. Of note, patient is pregnant.  Cefepime - Pregnancy category B  Plan: Cefepime 2g IV x1; then 2g IV q8h Vanc cancelled, no doses given Monitor clinical progress, c/s, renal function, abx plan/LOT   Height: 5\' 6"  (167.6 cm) Weight: 253 lb (114.76 kg) IBW/kg (Calculated) : 59.3  Temp (24hrs), Avg:98.4 F (36.9 C), Min:98 F (36.7 C), Max:98.7 F (37.1 C)   Recent Labs Lab 04/13/16 0035  WBC 9.3  CREATININE 0.37*    Estimated Creatinine Clearance: 135.9 mL/min (by C-G formula based on Cr of 0.37).    Allergies  Allergen Reactions  . Iodine Hives  . Shellfish Allergy     Antimicrobials this admission: 4/25 cefepime >>  Dose adjustments this admission: n/a  Microbiology results:    Babs Bertin, PharmD, [facility] Clinical Pharmacist Pager [phone] 04/13/2016 9:23 AM

## 2016-04-13 NOTE — ED Notes (Signed)
Attempted report 

## 2016-04-13 NOTE — ED Notes (Signed)
OB Rapid Response with patient at this time.

## 2016-04-13 NOTE — Progress Notes (Signed)
Pt transferred from ED to room 14 via stretcher, pt oriented to room and and unit, pt alert and oriented x4, skin checked by 2 nurses ; skin intact, pt resting in bed with call bell in reach

## 2016-04-13 NOTE — Progress Notes (Signed)
In to see patient for daily doppler FHTs.  WNL.  Patient has no complaint of abdominal pain, vaginal bleeding or leaking of fluids.  She is well aside from tachypnea. She states her medical attending doctors plan to admit her for 24 hours.  Questions asked and answered about need for control of blood pressure, q2 wk appointments and control of thyroid.  Questions asked and answered about the structure of Faculty Practice care at [facility].

## 2016-04-13 NOTE — Progress Notes (Signed)
Asked to comment on this patient and full consult to follow tomorrow:  1. HTN and Hyperthyroid - BP has been elevated for some time and patient seems to be poorly compliant with treatment of BP or Graves disease. She has new onset proteinuria, in this pregnancy, but < 20 wks makes pre-eclampsia far less likely. (It is case reportable to find this diagnosis at < 20 wks) Would consider attempts at getting her euthyroid and outpatient endocrinology referral is needed. Patient had an ECHO which showed slightly diminished EF and dilated LV and LA in 2/17--? Related to her thyroid illness.  Please continue her Tapazole, Labetalol and should be on baby ASA daily (ordered).  2. SOB/Dyspnea/Opacities in the lungs - given other co-morbidities and the proteinuria, we should probably check ANA (ordered) and r/o SLE.  3. Proteinuria should be quantified - ordered 24 hour urine.  4. Pregnancy - SIUP with vanishing twin at 21 wks. Her abnormal quad was abnormal due to incorrect Gestational age and therefore does not reflect an increased risk of Down's Syndrome in this pregnancy. Detailed U/S is normal. She is 21 0/7 wks today by good dating. Daily Doppler of FHT is sufficient to monitor her fetus at this point.

## 2016-04-13 NOTE — ED Notes (Signed)
Patient transported to CT 

## 2016-04-14 DIAGNOSIS — E0500 Thyrotoxicosis with diffuse goiter without thyrotoxic crisis or storm: Secondary | ICD-10-CM | POA: Diagnosis not present

## 2016-04-14 DIAGNOSIS — E0590 Thyrotoxicosis, unspecified without thyrotoxic crisis or storm: Secondary | ICD-10-CM

## 2016-04-14 DIAGNOSIS — O10912 Unspecified pre-existing hypertension complicating pregnancy, second trimester: Secondary | ICD-10-CM | POA: Diagnosis not present

## 2016-04-14 DIAGNOSIS — O3110X0 Continuing pregnancy after spontaneous abortion of one fetus or more, unspecified trimester, not applicable or unspecified: Secondary | ICD-10-CM | POA: Diagnosis not present

## 2016-04-14 DIAGNOSIS — O99282 Endocrine, nutritional and metabolic diseases complicating pregnancy, second trimester: Secondary | ICD-10-CM

## 2016-04-14 DIAGNOSIS — O30009 Twin pregnancy, unspecified number of placenta and unspecified number of amniotic sacs, unspecified trimester: Secondary | ICD-10-CM | POA: Diagnosis not present

## 2016-04-14 DIAGNOSIS — Z3A21 21 weeks gestation of pregnancy: Secondary | ICD-10-CM

## 2016-04-14 LAB — T3: T3, Total: 427 ng/dL — ABNORMAL HIGH (ref 71–180)

## 2016-04-14 LAB — RENAL FUNCTION PANEL
ANION GAP: 8 (ref 5–15)
Albumin: 2.2 g/dL — ABNORMAL LOW (ref 3.5–5.0)
BUN: 6 mg/dL (ref 6–20)
CHLORIDE: 109 mmol/L (ref 101–111)
CO2: 21 mmol/L — ABNORMAL LOW (ref 22–32)
Calcium: 8.5 mg/dL — ABNORMAL LOW (ref 8.9–10.3)
Creatinine, Ser: 0.4 mg/dL — ABNORMAL LOW (ref 0.44–1.00)
Glucose, Bld: 107 mg/dL — ABNORMAL HIGH (ref 65–99)
PHOSPHORUS: 5.2 mg/dL — AB (ref 2.5–4.6)
POTASSIUM: 3.3 mmol/L — AB (ref 3.5–5.1)
Sodium: 138 mmol/L (ref 135–145)

## 2016-04-14 LAB — MAGNESIUM: MAGNESIUM: 1.8 mg/dL (ref 1.7–2.4)

## 2016-04-14 LAB — CBC
HEMATOCRIT: 26.3 % — AB (ref 36.0–46.0)
HEMOGLOBIN: 8.3 g/dL — AB (ref 12.0–15.0)
MCH: 21.6 pg — ABNORMAL LOW (ref 26.0–34.0)
MCHC: 31.6 g/dL (ref 30.0–36.0)
MCV: 68.3 fL — AB (ref 78.0–100.0)
Platelets: 228 10*3/uL (ref 150–400)
RBC: 3.85 MIL/uL — AB (ref 3.87–5.11)
RDW: 14.8 % (ref 11.5–15.5)
WBC: 12.7 10*3/uL — AB (ref 4.0–10.5)

## 2016-04-14 LAB — IRON AND TIBC
IRON: 67 ug/dL (ref 28–170)
Saturation Ratios: 27 % (ref 10.4–31.8)
TIBC: 246 ug/dL — ABNORMAL LOW (ref 250–450)
UIBC: 179 ug/dL

## 2016-04-14 LAB — C-REACTIVE PROTEIN: CRP: <0.5 mg/dL (ref <1.0)

## 2016-04-14 LAB — FERRITIN: FERRITIN: 163 ng/mL (ref 11–307)

## 2016-04-14 LAB — T4: T4, Total: 32 ug/dL — ABNORMAL HIGH (ref 4.5–12.0)

## 2016-04-14 LAB — ANTINUCLEAR ANTIBODIES, IFA: ANA Ab, IFA: NEGATIVE

## 2016-04-14 LAB — SEDIMENTATION RATE: SED RATE: 40 mm/h — AB (ref 0–22)

## 2016-04-14 MED ORDER — PANTOPRAZOLE SODIUM 40 MG PO TBEC
40.0000 mg | DELAYED_RELEASE_TABLET | Freq: Two times a day (BID) | ORAL | Status: DC
  Administered 2016-04-15 – 2016-04-21 (×14): 40 mg via ORAL
  Filled 2016-04-14 (×15): qty 1

## 2016-04-14 MED ORDER — POTASSIUM CHLORIDE CRYS ER 20 MEQ PO TBCR
40.0000 meq | EXTENDED_RELEASE_TABLET | Freq: Once | ORAL | Status: AC
  Administered 2016-04-14: 40 meq via ORAL
  Filled 2016-04-14: qty 2

## 2016-04-14 MED ORDER — WHITE PETROLATUM GEL
Status: AC
  Administered 2016-04-14: 15:00:00
  Filled 2016-04-14: qty 1

## 2016-04-14 MED ORDER — ACETAMINOPHEN 325 MG PO TABS
650.0000 mg | ORAL_TABLET | Freq: Once | ORAL | Status: AC
  Administered 2016-04-14: 650 mg via ORAL
  Filled 2016-04-14: qty 2

## 2016-04-14 MED ORDER — PANTOPRAZOLE SODIUM 40 MG PO TBEC
40.0000 mg | DELAYED_RELEASE_TABLET | Freq: Every day | ORAL | Status: DC
  Administered 2016-04-14: 40 mg via ORAL
  Filled 2016-04-14: qty 1

## 2016-04-14 MED ORDER — METHIMAZOLE 5 MG PO TABS
5.0000 mg | ORAL_TABLET | Freq: Three times a day (TID) | ORAL | Status: DC
  Administered 2016-04-14 – 2016-04-19 (×15): 5 mg via ORAL
  Filled 2016-04-14 (×18): qty 1

## 2016-04-14 NOTE — Progress Notes (Signed)
Pt c/o of severe abdominal pain with cramping and a cough.  Made Dr. Allena Katz aware. Heat packs given. Awaiting orders to placed, will continue to monitor.

## 2016-04-14 NOTE — Consult Note (Signed)
[facility]  Impression: 1. HTN and Hyperthyroid - BP has been elevated for some time and patient seems to be poorly compliant with treatment of BP or Graves' disease. She has new onset proteinuria, in this pregnancy, but < 20 wks which makes pre-eclampsia far less likely. (It is case reportable to find this diagnosis at < 20 wks) Would consider attempts at getting her euthyroid and outpatient endocrinology referral is needed. Patient had an ECHO which showed slightly diminished EF and dilated LV and LA in 2/17--? Related to her thyroid illness.   2. SOB/Dyspnea/Opacities in the lungs - given other co-morbidities and the proteinuria (lungs and kidney - thinking systemic -consider SLE, Wegener's, Goodpasture's or possibly sarcoidosis)  3. Pregnancy - SIUP with vanishing twin at 21 wks. Her abnormal quad was abnormal due to incorrect Gestational age and therefore does not reflect an increased risk of Down's Syndrome in this pregnancy. Detailed U/S is normal. She is 21 1/7 wks today by good dating. Daily Doppler of FHT is sufficient to monitor her fetus at this point.  4. Anemia - although she is young and can tolerate this hemoglobin, could consider transfusion if persistently low vs. Feraheme  Recommendations: Increase Tapazole to 5 mg tid Continue Labetalol 24 hour urine for protein Baby ASA Work-up for autoimmune illnesses  Reason for consult: Patient is a 25 y.o. [id number] female who was admitted with presumed pneumonia in 2/17. Treated and sent home. Did have ECHO at that time which showed mild dysfunction. Re-admitted with dyspnea, SOB and pulmonary infiltrates which do not appear to be consistent with pneumonia or pulmonary edema. CT reveals ground glass opacities.  Also with proteinuria of unclear etiology--not usually associated with Graves' disease. Patient has been non-compliant with treatment for Graves. Had endocrinology seeing her in [place], but  none since moving here and her medicaid [place] up in that county. Has been offered RAI in the past and declined it. Her free T4 and Free T3 remain elevated. She has CHTN as well and her BP's are elevated--if this is separate from her hyperthyroid, is hard to tell.  We are asked to see the patient regarding ruling out pre-eclampsia and pregnancy monitoring.  Past Medical History  Diagnosis Date  . Hypertension   . Obesity   . CAP (community acquired pneumonia) 02/13/2016  . Pneumonia "several times"  . Chronic bronchitis (HCC)   . Graves disease   . Graves disease   . Headache   . Anemia     Past Surgical History  Procedure Laterality Date  . Eye surgery Bilateral 2014    "for Graves Disease; eyelid retraction on left; decompression on the right""  . Cesarean section      History reviewed. No pertinent family history.  Social History   Social History  . Marital Status: Single    Spouse Name: N/A  . Number of Children: N/A  . Years of Education: N/A   Occupational History  . Not on file.   Social History Main Topics  . Smoking status: Never Smoker   . Smokeless tobacco: Never Used  . Alcohol Use: No  . Drug Use: No  . Sexual Activity: Not Currently   Other Topics Concern  . Not on file   Social History Narrative    . aspirin  81 mg Oral Daily  . enoxaparin (LOVENOX) injection  40 mg Subcutaneous Q24H  . ferrous sulfate  325 mg Oral Q breakfast  . labetalol  300  mg Oral BID  . methimazole  5 mg Oral BID  . prenatal multivitamin  1 tablet Oral Q1200  . sodium chloride flush  3 mL Intravenous Q12H    Allergies  Allergen Reactions  . Iodine Hives  . Shellfish Allergy     Review of Systems - Negative except SOB, dyspnea, heart racing, chronic vomiting without nausea, reports fetal movement  Exam Filed Vitals:   04/14/16 0657 04/14/16 0915  BP: 111/67 120/55  Pulse: 91 101  Temp: 97.8 F (36.6 C)   Resp: 18    O2 sats 98% room air  Physical  Examination: General appearance - tachypneic, cannot speak in complete sentences Eyes - right sided exophthalmos Chest - scattered rhonchi Heart - tachycardia  Abdomen - soft, non tender, non distended, no masses or organomegaly Neurological - alert, oriented, normal speech, no focal findings or movement disorder noted Extremities - Homan's sign negative bilaterally  Labs:  CBC    Component Value Date/Time   WBC 12.7* 04/14/2016 0706   RBC 3.85* 04/14/2016 0706   HGB 8.3* 04/14/2016 0706   HCT 26.3* 04/14/2016 0706   PLT 228 04/14/2016 0706   MCV 68.3* 04/14/2016 0706   MCH 21.6* 04/14/2016 0706   MCHC 31.6 04/14/2016 0706   RDW 14.8 04/14/2016 0706   LYMPHSABS 2.6 04/13/2016 0035   MONOABS 0.7 04/13/2016 0035   EOSABS 0.1 04/13/2016 0035   BASOSABS 0.0 04/13/2016 0035    CMP     Component Value Date/Time   NA 138 04/14/2016 0706   K 3.3* 04/14/2016 0706   CL 109 04/14/2016 0706   CO2 21* 04/14/2016 0706   GLUCOSE 107* 04/14/2016 0706   BUN 6 04/14/2016 0706   CREATININE 0.40* 04/14/2016 0706   CREATININE 0.40* 02/26/2016 0844   CALCIUM 8.5* 04/14/2016 0706   PROT 6.8 04/13/2016 0035   ALBUMIN 2.2* 04/14/2016 0706   AST 19 04/13/2016 0035   ALT 13* 04/13/2016 0035   ALKPHOS 95 04/13/2016 0035   BILITOT 0.4 04/13/2016 0035   GFRNONAA >60 04/14/2016 0706   GFRAA >60 04/14/2016 0706    Lab Results  Component Value Date   TSH 0.049* 04/13/2016    Radiological Studies Dg Chest 2 View  04/12/2016  CLINICAL DATA:  Patient with recent history of pneumonia. Persistent symptoms. EXAM: CHEST  2 VIEW COMPARISON:  Chest radiograph 02/09/2016 FINDINGS: Stable enlarged cardiac and mediastinal contours. Interval improvement in previously described bilateral consolidative pulmonary opacities. No large pleural effusion or pneumothorax. Regional skeleton is unremarkable. IMPRESSION: Cardiomegaly. Significant interval improvement and near complete resolution of previously  described multi focal pulmonary opacities. Electronically Signed   By: Annia Belt M.D.   On: 04/12/2016 22:33   Ct Angio Chest Pe W/cm &/or Wo Cm  04/13/2016  CLINICAL DATA:  Shortness of breath for 2 weeks. T kidney and tachycardia. Twenty-two weeks pregnant. EXAM: CT ANGIOGRAPHY CHEST WITH CONTRAST TECHNIQUE: Multidetector CT imaging of the chest was performed using the standard protocol during bolus administration of intravenous contrast. Multiplanar CT image reconstructions and MIPs were obtained to evaluate the vascular anatomy. CONTRAST:  100 mL Isovue 370 COMPARISON:  Chest radiographs 04/12/2016 FINDINGS: There is moderate motion artifact which limits assessment of the segmental and subsegmental pulmonary arteries. Within this limitation, no filling defects suggestive of pulmonary emboli are identified. There is mild main pulmonary arterial enlargement which can be seen in the setting of pulmonary arterial hypertension. The heart is again noted to be enlarged. The thoracic aorta is normal  in caliber. The thyroid gland is diffusely enlarged, incompletely visualized though with evidence of mild transverse narrowing of the trachea. Small mediastinal lymph nodes measure up to approximately 1 cm in short axis in the right paratracheal and subcarinal stations, and there are subcentimeter hilar lymph nodes, most likely reactive. No pleural or pericardial effusion is identified, although the posterior costophrenic angles were incompletely imaged. Evaluation of the lung parenchyma is limited by motion artifact. There are patchy areas of ground-glass opacity throughout both lungs, greatest in the lower lobes. Small nodular opacities in the basilar right lower lobe measure up to 10 x 5 mm. Several small peripheral nodules in the right middle lobe measure up to 4 mm in size, likely infectious/inflammatory. The visualized portion of the upper abdomen is unremarkable. No acute osseous abnormality is identified. Review  of the MIP images confirms the above findings. IMPRESSION: 1. Moderately motion degraded examination without pulmonary emboli identified. 2. Ground-glass opacities throughout both lungs which may reflect pneumonia although pulmonary edema is also possible. 3. Cardiomegaly and pulmonary arterial enlargement. Electronically Signed   By: Sebastian Ache M.D.   On: 04/13/2016 08:53   [place] Mfm Ob Detail +14 Wk  03/30/2016  OBSTETRICAL ULTRASOUND: This exam was performed within a [facility]. The OB US report was generated in the AS system, and faxed to the ordering physician.  This report is available in the [employer]. See the [facility] Obstetric US report via the Image Link.   Thank you so much for allowing [place] to participate in the care of this patient.  We will continue to follow with you. Please call the attending OB/GYN physician with questions or concerns at [phone].

## 2016-04-14 NOTE — Consult Note (Signed)
Tara [name] M. Tara Mccubbins EdD 

## 2016-04-14 NOTE — Progress Notes (Signed)
Patient ID: Tara [name], female   DOB: [birth year], 25 y.o.   MRN: [id number]   Subjective: Tara [name] is feeling less anxious, short of breath, and fewer palpitations since re-starting her labetalol and methimazole. She had not been taking her medicines because she could find an endocrinologist in [place] who accepts Medicaid. She had been on methimazole '15mg'$  three times daily on an outpatient basis in the past.   Objective: Vital signs in last 24 hours: Filed Vitals:   04/13/16 2202 04/14/16 0657 04/14/16 0915 04/14/16 1308  BP: 127/90 111/67 120/55 133/39  Pulse: 117 91 101 92  Temp:  97.8 F (36.6 C) 98 F (36.7 C) 98.4 F (36.9 C)  TempSrc:   Oral   Resp:  18 40 18  Height:      Weight:  249 lb 9 oz (113.2 kg)    SpO2:  98% 96% 99%   General: resting in bed, quite anxious appropriately conversational HEENT: overt proptosis, no oral lesions, thyroid bruit present Cardiac: tachycardic but regular rhythm, no rubs, murmurs or gallops Pulm: breathing well, clear to auscultation bilaterally Abd: bowel sounds normal, soft, nondistended, non-tender Ext: warm and well perfused, without pedal edema Skin: no rash, hair, or nail changes Neuro: alert and oriented X3, cranial nerves II-XII grossly intact, moving all extremities well  Lab Results: Basic Metabolic Panel:  Recent Labs Lab 04/13/16 0035 04/13/16 1030 04/14/16 0706  NA 135  --  138  K 2.9*  --  3.3*  CL 105  --  109  CO2 18*  --  21*  GLUCOSE 103*  --  107*  BUN <5*  --  6  CREATININE 0.37*  --  0.40*  CALCIUM 8.8*  --  8.5*  MG  --  1.6* 1.8  PHOS  --   --  5.2*   CBC:  Recent Labs Lab 04/13/16 0035 04/14/16 0706  WBC 9.3 12.7*  NEUTROABS 5.9  --   HGB 10.3* 8.3*  HCT 32.4* 26.3*  MCV 67.9* 68.3*  PLT 253 228   Thyroid Function Tests:  Recent Labs Lab 04/13/16 0035 04/13/16 1030  TSH  --  0.049*  T4TOTAL 32.0*  --   FREET4  --  4.39*   Urinalysis:  Recent Labs Lab 04/13/16 1730   COLORURINE YELLOW  LABSPEC >1.046*  PHURINE 6.0  GLUCOSEU 250*  HGBUR TRACE*  BILIRUBINUR NEGATIVE  KETONESUR NEGATIVE  PROTEINUR 100*  NITRITE NEGATIVE  LEUKOCYTESUR NEGATIVE   Studies/Results: Dg Chest 2 View  04/12/2016  CLINICAL DATA:  Patient with recent history of pneumonia. Persistent symptoms. EXAM: CHEST  2 VIEW COMPARISON:  Chest radiograph 02/09/2016 FINDINGS: Stable enlarged cardiac and mediastinal contours. Interval improvement in previously described bilateral consolidative pulmonary opacities. No large pleural effusion or pneumothorax. Regional skeleton is unremarkable. IMPRESSION: Cardiomegaly. Significant interval improvement and near complete resolution of previously described multi focal pulmonary opacities. Electronically Signed   By: Lovey Newcomer M.D.   On: 04/12/2016 22:33   Ct Angio Chest Pe W/cm &/or Wo Cm  04/13/2016  CLINICAL DATA:  Shortness of breath for 2 weeks. T kidney and tachycardia. Twenty-two weeks pregnant. EXAM: CT ANGIOGRAPHY CHEST WITH CONTRAST TECHNIQUE: Multidetector CT imaging of the chest was performed using the standard protocol during bolus administration of intravenous contrast. Multiplanar CT image reconstructions and MIPs were obtained to evaluate the vascular anatomy. CONTRAST:  100 mL Isovue 370 COMPARISON:  Chest radiographs 04/12/2016 FINDINGS: There is moderate motion artifact which limits assessment of the segmental and  subsegmental pulmonary arteries. Within this limitation, no filling defects suggestive of pulmonary emboli are identified. There is mild main pulmonary arterial enlargement which can be seen in the setting of pulmonary arterial hypertension. The heart is again noted to be enlarged. The thoracic aorta is normal in caliber. The thyroid gland is diffusely enlarged, incompletely visualized though with evidence of mild transverse narrowing of the trachea. Small mediastinal lymph nodes measure up to approximately 1 cm in short axis in  the right paratracheal and subcarinal stations, and there are subcentimeter hilar lymph nodes, most likely reactive. No pleural or pericardial effusion is identified, although the posterior costophrenic angles were incompletely imaged. Evaluation of the lung parenchyma is limited by motion artifact. There are patchy areas of ground-glass opacity throughout both lungs, greatest in the lower lobes. Small nodular opacities in the basilar right lower lobe measure up to 10 x 5 mm. Several small peripheral nodules in the right middle lobe measure up to 4 mm in size, likely infectious/inflammatory. The visualized portion of the upper abdomen is unremarkable. No acute osseous abnormality is identified. Review of the MIP images confirms the above findings. IMPRESSION: 1. Moderately motion degraded examination without pulmonary emboli identified. 2. Ground-glass opacities throughout both lungs which may reflect pneumonia although pulmonary edema is also possible. 3. Cardiomegaly and pulmonary arterial enlargement. Electronically Signed   By: Logan Bores M.D.   On: 04/13/2016 08:53   Medications: I have reviewed the patient's current medications. Scheduled Meds: . aspirin  81 mg Oral Daily  . enoxaparin (LOVENOX) injection  40 mg Subcutaneous Q24H  . ferrous sulfate  325 mg Oral Q breakfast  . labetalol  300 mg Oral BID  . methimazole  5 mg Oral TID  . prenatal multivitamin  1 tablet Oral Q1200  . sodium chloride flush  3 mL Intravenous Q12H   Continuous Infusions:  PRN Meds:.ondansetron (ZOFRAN) IV   Assessment/Plan:  Tara [name] is a 25 year old pregnant lady at [redacted] weeks gestation and hyperthyroidism here with dyspnea and anxiety caused by hyperthyroidism due to not taking her methimazole and labetalol.  Dyspnea from hyperthyroidism: Her dyspnea, tachycardia, and hypertension are all improving now that she is back on methimazole and labetalol. We'll watch her tonight and likely discharge her tomorrow with  close follow-up in our clinic to further titrate her methimazole. -Continue methimazole '5mg'$  three times daily -Continue labetalol '300mg'$  twice daily -Check oxygen saturation while walking today -Upon discharge, she will need her free T4 and TSH checked once every 2-4 weeks (first between May 5-10). We will titrate her methimazole to a free T4 between 1.3-1.6 and TSH less than 4.5. Once she is in her third trimester, we will check every 2 weeks because her hyperthyroidism may resolve. We will also frequently assess her tachycardia and hypertension to stop labetalol as soon as possible to avoid fetal hypoglycemia.  Proteinuria: I'm not sure why she has such extensive proteinuria; given her low albumin, I'm worried this may be nephrotic syndrome. We'll get a better idea once her 24 hour urine protein comes back and may need nephrology follow-up to consider renal biopsy. This has been present since before her pregnancy, so this is not pre-eclampsia. Although she has Graves, she does not have any other signs of sytemic lupus erythematosus. We'll check an ANA to be thorough, as well as an ESR/CRP. Thus far her renal function is otherwise good. We'll  -Collecting 24 hour urine -Follow ANA -Follow ESR/CRP -Follow hepatitis C -BMP tomorrow -If she  does indeed have nephrotic syndrome, we will arrange outpatient Nephrology follow-up  Microcytic anemia: Hemoglobin 8.3 today, without signs of bleeding. Checking iron studies now. -Follow ferritin, iron, and TIBC -CBC tomorrow  Dispo: Disposition is deferred at this time, awaiting improvement of current medical problems.  Anticipated discharge in approximately 1-2 day(s).   The patient does not have a current PCP (No Pcp Per Patient) and does need an [facility] hospital follow-up appointment after discharge.  The patient does not know have transportation limitations that hinder transportation to clinic appointments.  .Services Needed at time of discharge: Y = Yes,  Blank = No PT:   OT:   RN:   Equipment:   Other:     LOS: 1 day   Loleta Chance, MD 04/14/2016, 1:22 PM

## 2016-04-14 NOTE — Discharge Summary (Signed)
Name: Tara [name] MRN: [id number] DOB: 06/04/1991 25 y.o. PCP: No Pcp Per Patient  Date of Admission: 04/13/2016  1:54 AM Date of Discharge: 04/21/2016 Attending Physician: Inez Catalina, MD  Discharge Diagnosis: 1. Hyperthyroidism in pregnancy exacerbated by medication non-adherence 2. Proteinuria   Discharge Medications:    Medication List    STOP taking these medications        GOODSENSE PRENATAL VITAMINS 28-0.8 MG Tabs      TAKE these medications        aspirin 81 MG chewable tablet  Chew 1 tablet (81 mg total) by mouth daily.     benzonatate 100 MG capsule  Commonly known as:  TESSALON PERLES  Take 2 capsules (200 mg total) by mouth 3 (three) times daily as needed for cough.     ferrous sulfate 325 (65 FE) MG tablet  Commonly known as:  FERROUSUL  Take 1 tablet (325 mg total) by mouth 2 (two) times daily.     furosemide 40 MG tablet  Commonly known as:  LASIX  Take 1 tablet (40 mg total) by mouth 2 (two) times daily.     labetalol 300 MG tablet  Commonly known as:  NORMODYNE  Take 1 tablet (300 mg total) by mouth 2 (two) times daily.     methimazole 10 MG tablet  Commonly known as:  TAPAZOLE  Take 1 tablet (10 mg total) by mouth 3 (three) times daily.     potassium chloride SA 20 MEQ tablet  Commonly known as:  K-DUR,KLOR-CON  Take 1 tablet (20 mEq total) by mouth daily.     prenatal multivitamin Tabs tablet  Take 1 tablet by mouth daily at 12 noon.       Disposition and follow-up:   [name] was discharged from [facility] in Stable condition.  At the hospital follow up visit please address:  1.  Re-check TSH and free T4 around May 5-10, and titrate methimazole to TSH < 4.5 and free T4 between 1.4-1.6  2. She will need TSH and free T4 checks twice weekly until she is euthyroid, then monthly for the duration of her pregnancy  3. If she is not hypertensive nor tachycardic, consider stopping or down-titrating  labetalol  4. In her third trimester, she may become euthyroid so she may need to come off of methimazole Discharge Instructions    Diet - low sodium heart healthy    Complete by:  As directed      Increase activity slowly    Complete by:  As directed           Consultations:  Treatment Team:  Reva Bores, MD  Procedures Performed:  Dg Chest 2 View  04/20/2016  CLINICAL DATA:  Acute onset shortness of breath today. Hypertension. EXAM: CHEST  2 VIEW COMPARISON:  04/12/2016 FINDINGS: Mild cardiomegaly is stable. Low lung volumes again noted. Mild diffuse interstitial prominence is again noted and may be due to mild interstitial edema or pneumonitis. No evidence of pulmonary consolidation or pleural effusion. IMPRESSION: Stable cardiomegaly and pulmonary interstitial prominence. Mild interstitial edema or pneumonitis cannot be excluded. No focal consolidation or pleural effusion. Electronically Signed   By: Myles Rosenthal M.D.   On: 04/20/2016 13:25   Dg Chest 2 View  04/12/2016  CLINICAL DATA:  Patient with recent history of pneumonia. Persistent symptoms. EXAM: CHEST  2 VIEW COMPARISON:  Chest radiograph 02/09/2016 FINDINGS: Stable enlarged cardiac and mediastinal contours. Interval improvement in  previously described bilateral consolidative pulmonary opacities. No large pleural effusion or pneumothorax. Regional skeleton is unremarkable. IMPRESSION: Cardiomegaly. Significant interval improvement and near complete resolution of previously described multi focal pulmonary opacities. Electronically Signed   By: Annia Belt M.D.   On: 04/12/2016 22:33   Ct Angio Chest Pe W/cm &/or Wo Cm  04/13/2016  CLINICAL DATA:  Shortness of breath for 2 weeks. T kidney and tachycardia. Twenty-two weeks pregnant. EXAM: CT ANGIOGRAPHY CHEST WITH CONTRAST TECHNIQUE: Multidetector CT imaging of the chest was performed using the standard protocol during bolus administration of intravenous contrast. Multiplanar CT  image reconstructions and MIPs were obtained to evaluate the vascular anatomy. CONTRAST:  100 mL Isovue 370 COMPARISON:  Chest radiographs 04/12/2016 FINDINGS: There is moderate motion artifact which limits assessment of the segmental and subsegmental pulmonary arteries. Within this limitation, no filling defects suggestive of pulmonary emboli are identified. There is mild main pulmonary arterial enlargement which can be seen in the setting of pulmonary arterial hypertension. The heart is again noted to be enlarged. The thoracic aorta is normal in caliber. The thyroid gland is diffusely enlarged, incompletely visualized though with evidence of mild transverse narrowing of the trachea. Small mediastinal lymph nodes measure up to approximately 1 cm in short axis in the right paratracheal and subcarinal stations, and there are subcentimeter hilar lymph nodes, most likely reactive. No pleural or pericardial effusion is identified, although the posterior costophrenic angles were incompletely imaged. Evaluation of the lung parenchyma is limited by motion artifact. There are patchy areas of ground-glass opacity throughout both lungs, greatest in the lower lobes. Small nodular opacities in the basilar right lower lobe measure up to 10 x 5 mm. Several small peripheral nodules in the right middle lobe measure up to 4 mm in size, likely infectious/inflammatory. The visualized portion of the upper abdomen is unremarkable. No acute osseous abnormality is identified. Review of the MIP images confirms the above findings. IMPRESSION: 1. Moderately motion degraded examination without pulmonary emboli identified. 2. Ground-glass opacities throughout both lungs which may reflect pneumonia although pulmonary edema is also possible. 3. Cardiomegaly and pulmonary arterial enlargement. Electronically Signed   By: Sebastian Ache M.D.   On: 04/13/2016 08:53   [place] Mfm Ob Detail +14 Wk  03/30/2016  OBSTETRICAL ULTRASOUND: This exam was  performed within a [facility]. The OB US report was generated in the [facility] system, and faxed to the ordering physician.  This report is available in the [employer]. See the [facility] Obstetric US report via the Image Link.  Admission HPI:   [name] is a 25 y.o. female w/ PMHx of hyperthyroidism, non-compliant with BB and Methimazole, [redacted] weeks pregnant, presents to the ED w/ complaints of worsening SOB and productive cough. Patient was admitted to [facility] in 01/2016 with pneumonia and symptoms of hyperthyroidism at that time, treated with CAP coverage and discharged on Methimazole and Labetalol. She says that about a week afterwards, she started having continued SOB and cough. This AM, she states she was at work and had such bad SOB and cough productive of a very green sputum, that her work sent her home and instructed her to go to the hospital. She says she feels she has had somewhat of a fever recently, as well as nausea and vomiting. She denies diarrhea or abdominal discomfort. She also admits to some mild chest tightness and palpitations, but denies dizziness, lightheadedness.   Patient has not been taking her Methimazole or Labetalol for  the past 1-2 weeks because she says she ran out and has not seen her endocrinologist or a medical doctor since her discharge in February.   In the ED, patient CXR showed improvement since her previous admission, however, d-dimer was checked which was elevated. Patient received CTA which was negative for PE but showed bibasilar infiltrates. BNP 70. Started on Vanc + Cefepime in the ED.  Hospital Course by problem list:   1. Hyperthyroidism in pregnancy exacerbated by medication non-adherence: On admission, she was anxious, dyspneic, tachycardic, and hypertensive from not taking her methimazole nor labetalol for the past 2 weeks. Her TSH was 0.05 and free T4 was 4.4. Once her methimazole and labetalol were re-started, her symptoms improved  markedly. She was discharged on methimazole  three times daily and labetalol  twice daily. After discharge, she will need a TSH and free T4 checked around May 5-10 for further methimazole titration. Her goal is a TSH<4.5 and free T4 between 1.4-1.6. Thereafter, she will need TSH and and free T4 labs drawn every 2 weeks for further methimazole titration. During these visits, we should try to down-titrate her labetalol as she becomes euthyroid to prevent fetal hypoglycemia. In her third trimester, her hyperthyroidism will likely resolve, so we will need to consider down-titrating the methimazole. Once it is stopped, we will check weekly TSH and free T4 levels to ensure she remains euthyroid for the duration of her pregnancy, as fetal growth defects are most likely to occur during this timeframe.  2. Proteinuria: She had proteinuria documented since the second week of her pregnancy with normal creatinine clearance. Her 24-hour urine protein was 252. Obstetrics evaluated her and felt this was very unlikely to be pre-eclampsia as her proteinuria had been documented early in her pregnancy as well as her hypertension.  Discharge Vitals:   BP 129/62 mmHg  Pulse 89  Temp(Src) 98.1 F (36.7 C) (Oral)  Resp 17  Ht  (1.676 m)  Wt 245 lb 7 oz (111.33 kg)  BMI 39.63 kg/m2  SpO2 98%  LMP 11/03/2015 (LMP Unknown)  Discharge Labs:  No results found for this or any previous visit (from the past 24 hour(s)).  Signed: Valentino Nose, MD 04/21/2016, 11:25 AM

## 2016-04-14 NOTE — Progress Notes (Signed)
Here to doppler FHR 151, dr Shawnie Pons on unit, informed of FHR.

## 2016-04-14 NOTE — Progress Notes (Signed)
SATURATION QUALIFICATIONS: (This note is used to comply with regulatory documentation for home oxygen)  Patient Saturations on Room Air at Rest = 96%  Patient Saturations on Room Air while Ambulating =92%  HR up to 131 while ambulating, at rest HR back down 98

## 2016-04-15 DIAGNOSIS — Z331 Pregnant state, incidental: Secondary | ICD-10-CM | POA: Diagnosis not present

## 2016-04-15 DIAGNOSIS — R0600 Dyspnea, unspecified: Secondary | ICD-10-CM | POA: Diagnosis not present

## 2016-04-15 DIAGNOSIS — R05 Cough: Secondary | ICD-10-CM

## 2016-04-15 DIAGNOSIS — I5023 Acute on chronic systolic (congestive) heart failure: Secondary | ICD-10-CM

## 2016-04-15 DIAGNOSIS — I281 Aneurysm of pulmonary artery: Secondary | ICD-10-CM

## 2016-04-15 DIAGNOSIS — R918 Other nonspecific abnormal finding of lung field: Secondary | ICD-10-CM

## 2016-04-15 DIAGNOSIS — E0590 Thyrotoxicosis, unspecified without thyrotoxic crisis or storm: Secondary | ICD-10-CM | POA: Diagnosis not present

## 2016-04-15 DIAGNOSIS — I501 Left ventricular failure: Secondary | ICD-10-CM | POA: Diagnosis not present

## 2016-04-15 DIAGNOSIS — J810 Acute pulmonary edema: Secondary | ICD-10-CM | POA: Diagnosis not present

## 2016-04-15 DIAGNOSIS — R809 Proteinuria, unspecified: Secondary | ICD-10-CM

## 2016-04-15 DIAGNOSIS — R0902 Hypoxemia: Secondary | ICD-10-CM | POA: Diagnosis not present

## 2016-04-15 LAB — PROTEIN, URINE, 24 HOUR
COLLECTION INTERVAL-UPROT: 24 h
PROTEIN, 24H URINE: 252 mg/d — AB (ref 50–100)
PROTEIN, URINE: 21 mg/dL
URINE TOTAL VOLUME-UPROT: 1200 mL

## 2016-04-15 LAB — HCG, QUANTITATIVE, PREGNANCY: HCG, BETA CHAIN, QUANT, S: 57601 m[IU]/mL — AB (ref <5)

## 2016-04-15 LAB — BASIC METABOLIC PANEL
Anion gap: 9 (ref 5–15)
CO2: 21 mmol/L — ABNORMAL LOW (ref 22–32)
CREATININE: 0.38 mg/dL — AB (ref 0.44–1.00)
Calcium: 8.4 mg/dL — ABNORMAL LOW (ref 8.9–10.3)
Chloride: 108 mmol/L (ref 101–111)
GFR calc Af Amer: >60 mL/min (ref >60)
GLUCOSE: 118 mg/dL — AB (ref 65–99)
POTASSIUM: 3.4 mmol/L — AB (ref 3.5–5.1)
SODIUM: 138 mmol/L (ref 135–145)

## 2016-04-15 LAB — CREATININE CLEARANCE, URINE, 24 HOUR
COLLECTION INTERVAL-CRCL: 24 h
CREAT CLEAR: 303 mL/min — AB (ref 75–115)
CREATININE 24H UR: 1656 mg/d (ref 600–1800)
Creatinine, Urine: 137.99 mg/dL
URINE TOTAL VOLUME-CRCL: 1200 mL

## 2016-04-15 LAB — CBC
HCT: 25.4 % — ABNORMAL LOW (ref 36.0–46.0)
Hemoglobin: 8.3 g/dL — ABNORMAL LOW (ref 12.0–15.0)
MCH: 22.6 pg — AB (ref 26.0–34.0)
MCHC: 32.7 g/dL (ref 30.0–36.0)
MCV: 69 fL — AB (ref 78.0–100.0)
PLATELETS: 211 10*3/uL (ref 150–400)
RBC: 3.68 MIL/uL — ABNORMAL LOW (ref 3.87–5.11)
RDW: 15 % (ref 11.5–15.5)
WBC: 9.8 10*3/uL (ref 4.0–10.5)

## 2016-04-15 LAB — LACTIC ACID, PLASMA: Lactic Acid, Venous: 1 mmol/L (ref 0.5–2.0)

## 2016-04-15 LAB — HCV COMMENT:

## 2016-04-15 LAB — HEPATITIS C ANTIBODY (REFLEX): HCV Ab: <0.1 s/co ratio (ref 0.0–0.9)

## 2016-04-15 LAB — MRSA PCR SCREENING: MRSA BY PCR: NEGATIVE

## 2016-04-15 MED ORDER — LORAZEPAM 2 MG/ML IJ SOLN
1.0000 mg | Freq: Once | INTRAMUSCULAR | Status: AC
  Administered 2016-04-15: 1 mg via INTRAVENOUS
  Filled 2016-04-15: qty 1

## 2016-04-15 MED ORDER — POTASSIUM CHLORIDE 20 MEQ/15ML (10%) PO SOLN
20.0000 meq | Freq: Once | ORAL | Status: AC
  Administered 2016-04-15: 20 meq via ORAL
  Filled 2016-04-15: qty 15

## 2016-04-15 MED ORDER — FUROSEMIDE 10 MG/ML IJ SOLN
40.0000 mg | Freq: Once | INTRAMUSCULAR | Status: AC
  Administered 2016-04-15: 40 mg via INTRAVENOUS
  Filled 2016-04-15: qty 4

## 2016-04-15 MED ORDER — POTASSIUM CHLORIDE 10 MEQ/100ML IV SOLN
10.0000 meq | INTRAVENOUS | Status: AC
  Administered 2016-04-15 (×2): 10 meq via INTRAVENOUS
  Filled 2016-04-15 (×2): qty 100

## 2016-04-15 MED ORDER — BENZONATATE 100 MG PO CAPS
200.0000 mg | ORAL_CAPSULE | Freq: Two times a day (BID) | ORAL | Status: DC
  Administered 2016-04-15 – 2016-04-16 (×3): 200 mg via ORAL
  Filled 2016-04-15 (×4): qty 2

## 2016-04-15 MED ORDER — DEXTROMETHORPHAN POLISTIREX ER 30 MG/5ML PO SUER
15.0000 mg | Freq: Two times a day (BID) | ORAL | Status: DC
  Administered 2016-04-15: 15 mg via ORAL
  Administered 2016-04-15: 2.5 mL via ORAL
  Administered 2016-04-16: 15 mg via ORAL
  Filled 2016-04-15 (×3): qty 5

## 2016-04-15 NOTE — Progress Notes (Signed)
Patient has been tachycardic (120s), high anxiety, and pulmonlology consult stating pulmonary edema. Patient stated she "has felt this way before" when "my thyroid acts up". MD contacted to make aware and expressed concern for patient's risk for thryoid storm. MD placed orders for transfer to step down. Telemetry placed on patient. Rapid Response RN made aware and to assess patient.

## 2016-04-15 NOTE — Progress Notes (Signed)
Spoke with dr Erin Fulling regarding pt,informed her I went to try and doppler her baby this morning and pt was very upset, anxious, coughing,and vomitting. Told pt I would try to come back later. Dr Erin Fulling okay with trying to get doppler later, if not we can doppler her tomorrow.

## 2016-04-15 NOTE — Progress Notes (Addendum)
Patient hysterically crying and high anxiety about "something caught in my throat". Patient's voice is clear. Attempted to suction mouth. Patient stated is just started and she did not have anything to eat or drink. MD just left bedside, but paged to make aware. Patient has been tearful with anxiety all morning. Patient refused sip of water to attempt remove the feeling. Sp02 92 on room air HR 116.    10:36 AM MD stated he will contact OB to clarify medication/treatment options and to place patient on humidified air

## 2016-04-15 NOTE — Progress Notes (Signed)
Patient ID: Tara [name], female   DOB: 05/13/1991, 25 y.o.   MRN: [id number]   Subjective: Tara [name] is coughing incessantly since this morning and is very anxious. She feels like something is caught in her throat but she had not eaten anything nor taken any pills prior to this happening. She feels her heart is racing when she stands and walks. She denies any vaginal bleeding or abdominal pain. We tried to comfort her but her coughing was relentless.  Objective: Vital signs in last 24 hours: Filed Vitals:   04/14/16 2245 04/14/16 2309 04/15/16 0632 04/15/16 0931  BP: 137/66 137/66 134/57 134/61  Pulse: 107 107 98 103  Temp: 98.2 F (36.8 C)  98.4 F (36.9 C) 98 F (36.7 C)  TempSrc:   Oral Oral  Resp: 17  17 40  Height:      Weight:   257 lb 15 oz (117 kg)   SpO2: 100%  100% 93%   General: lying in bed coughing relentlessly and vomiting HEENT: overt proptosis, no oral lesions, thyroid bruit present Cardiac: tachycardic but regular rhythm, no rubs, murmurs or gallops Pulm: breathing well, clear to auscultation bilaterally Abd: bowel sounds normal, soft, nondistended, non-tender Ext: warm and well perfused, without pedal edema Skin: no rash, hair, or nail changes Neuro: alert and oriented X3, cranial nerves II-XII grossly intact, moving all extremities well  Lab Results: Basic Metabolic Panel:  Recent Labs Lab 04/13/16 1030 04/14/16 0706 04/15/16 0639  NA  --  138 138  K  --  3.3* 3.4*  CL  --  109 108  CO2  --  21* 21*  GLUCOSE  --  107* 118*  BUN  --  6 <5*  CREATININE  --  0.40* 0.38*  CALCIUM  --  8.5* 8.4*  MG 1.6* 1.8  --   PHOS  --  5.2*  --    CBC:  Recent Labs Lab 04/13/16 0035 04/14/16 0706 04/15/16 0639  WBC 9.3 12.7* 9.8  NEUTROABS 5.9  --   --   HGB 10.3* 8.3* 8.3*  HCT 32.4* 26.3* 25.4*  MCV 67.9* 68.3* 69.0*  PLT 253 228 211   Medications: I have reviewed the patient's current medications. Scheduled Meds: . aspirin  81 mg Oral Daily    . dextromethorphan  15 mg Oral BID  . enoxaparin (LOVENOX) injection  40 mg Subcutaneous Q24H  . ferrous sulfate  325 mg Oral Q breakfast  . labetalol  300 mg Oral BID  . methimazole  5 mg Oral TID  . pantoprazole  40 mg Oral BID  . prenatal multivitamin  1 tablet Oral Q1200  . sodium chloride flush  3 mL Intravenous Q12H   Continuous Infusions:  PRN Meds:.ondansetron (ZOFRAN) IV   Assessment/Plan:  Tara [name] is a 25 year old pregnant lady at [redacted] weeks gestation and hyperthyroidism here with dyspnea and anxiety, found to have ground-glass opacities and an enlarged pulmonary artery on her CT scan.  Relentless cough: I think this is primarily driven by her anxiety related to her hyperthyroidism. She does have ground-glass opacities and questionable pulmonary hypertension seen on her chest CT, but I can't connect these to an irrepressible cough. Her procalcitonin was normal so I don't think his is bacterial pneumonia requiring antibiotics. We tried lorazepam with no relief and we'll continue dextromethorphan. -Thanks Nursing for attempting to calm her -Continue dextromethorphan as needed (this is pregnancy category C)  Uncontrolled hyperthyroidism: Her hypertension is improving now that she's  back on methimazole and labetalol, but she's still quite tachycardic. We'll consider adding propanolol if her transthoracic echo looks okay. -Continue methimazole 5mg  three times daily -Continue labetalol 300mg  twice daily -Will consider adding propanolol pending TTE results -Upon discharge, she will need her free T4 and TSH checked once every 2-4 weeks (first between May 5-10). We will titrate her methimazole to a free T4 between 1.3-1.6 and TSH less than 4.5. Once she is in her third trimester, we will check every 2 weeks because her hyperthyroidism may resolve. We will also frequently assess her tachycardia and hypertension to stop labetalol as soon as possible to avoid fetal hypoglycemia.  Ground  glass opacities and dilated pulmonary artery on chest CT: She has ground-glass opacities and a dilated pulmonary artery on her admission chest CT, a markedly elevated HCG of 110k at around 14 weeks pregnancy, and elevated free T4 from not taking her methimazole nor labetalol. One thought is that her hyperthyroidism is simply inducing her anxiety and dyspnea, and the ground glass opacities represent a viral pneumonia. I do have a hard time explaining why her PA is dilated; we'll check a TTE to further clear up her picture. She had a TTE back in February that was normal. I doubt bacterial pneumonia as her procalcitonin was 0. Another consideration is gestational trophoblastic neoplasia given the fetal loss, markedly elevated HCG earlier in pregnancy (although this may have been from twins), and ground-glass opacities in her lungs. I've checked an HCG to see if it is markedly elevated again, and I appreciate obstetrics' help with the case. -Consulted pulmonology -Consulted obstetrics  Proteinuria: I'm not sure why she has such extensive proteinuria; given her low albumin, I'm worried this may be nephrotic syndrome. We'll get a better idea once her 24 hour urine protein comes back and may need nephrology follow-up to consider renal biopsy. This has been present since before her pregnancy, so this is not pre-eclampsia. I do not think she has SLE nor sarcoidosis. -Collecting 24 hour urine -If she does indeed have nephrotic syndrome, we will arrange outpatient Nephrology follow-up  Dispo: Disposition is deferred at this time, awaiting improvement of current medical problems.  Anticipated discharge in approximately 1-3 day(s).   The patient does NOT have a current PCP (No Pcp Per Patient) and does need an [facility] hospital follow-up appointment after discharge.  The patient does have transportation limitations that hinder transportation to clinic appointments.  .Services Needed at time of discharge: Y = Yes, Blank  = No PT:   OT:   RN:   Equipment:   Other:     LOS: 2 days   Tara Cooley, MD 04/15/2016, 11:49 AM

## 2016-04-15 NOTE — Consult Note (Signed)
Tara Beveridge EdD 

## 2016-04-15 NOTE — Consult Note (Addendum)
Name: Tara [name] MRN: [id number] DOB: November 19, 1991    ADMISSION DATE:  04/13/2016 CONSULTATION DATE:  04/15/16  REFERRING MD :  Dr. Dareen Piano  CHIEF COMPLAINT:  Abnormal CT Chest     HISTORY OF PRESENT ILLNESS:  25 y/o F with PMH of obesity, hyperthyroidism / Graves Disease (dx 2012), medically non-compliant with beta blocker / methimazole, HTN, anemia, headaches, chronic bronchitis, PNA, prior cesarean section and [redacted] weeks pregnant (G3P2A0) who presented to [facility] on 4/25 with complaints of cough and shortness of breath.    She was admitted in February 2017 for PNA and hyperthyroidism symptoms.  After discharge, she reports she developed cough which has been ongoing.  The patient reports running out of thyroid medications and has not been taking for ~ 2 weeks.   The patient reported on admission that she was told she could not return to work until she was seen which prompted evaluation.  She reports cough with green sputum production.  Initial ER evaluation notable for ambulatory O2 assessment where saturations remained >97% with exertion.  She had a coughing episode and sats decreased to 93% but recovered quickly.  Initial labs - Na 135, K 2.9, Mg 1.6, Cl 105, CO2 18, sr cr 0.37, glucose 103, BNP 70.2, WBC 9.3, Hgb 10.3, MCV 67.9, and platelets 253. D-dimer assessed - 0.60 (mild elevation).  Due to elevated D-Dimer, a CTA was assessed which was moderately motion degraded, no PE identified, ground-glass opacities throughout both lungs (pna vs pulmonary edema), cardiomegaly and pulmonary arterial enlargement.    Due to abnormal CT findings, PCCM consulted for evaluation.   PAST MEDICAL HISTORY :   has a past medical history of Hypertension; Obesity; CAP (community acquired pneumonia) (02/13/2016); Pneumonia ("several times"); Chronic bronchitis ([place]); Graves disease; Graves disease; Headache; and Anemia.  has past surgical history that includes Eye surgery (Bilateral, 2014) and Cesarean  section.    Prior to Admission medications   Medication Sig Start Date End Date Taking? Authorizing Provider  aspirin 81 MG chewable tablet Chew 1 tablet (81 mg total) by mouth daily. Patient not taking: Reported on 03/09/2016 02/26/16   Gwen Pounds, CNM  benzonatate (TESSALON PERLES) 100 MG capsule Take 2 capsules (200 mg total) by mouth 3 (three) times daily as needed for cough. Patient not taking: Reported on 03/09/2016 02/26/16   Gwen Pounds, CNM  ferrous sulfate (FERROUSUL) 325 (65 FE) MG tablet Take 1 tablet (325 mg total) by mouth 2 (two) times daily. Patient not taking: Reported on 03/30/2016 03/01/16   Gwen Pounds, CNM  labetalol (NORMODYNE) 300 MG tablet Take 1 tablet (300 mg total) by mouth 2 (two) times daily. Patient not taking: Reported on 04/13/2016 02/18/16   Zada Finders, MD  methimazole (TAPAZOLE) 5 MG tablet Take 1 tablet (5 mg total) by mouth 2 (two) times daily. Patient not taking: Reported on 04/13/2016 02/18/16   Zada Finders, MD  Prenatal Vit-Fe Fumarate-FA (GOODSENSE PRENATAL VITAMINS) 28-0.8 MG TABS Take 1 tablet by mouth daily. Patient not taking: Reported on 04/13/2016 12/12/15   Ripley Fraise, MD   Allergies  Allergen Reactions  . Iodine Hives  . Shellfish Allergy     FAMILY HISTORY:  family history is not on file.   SOCIAL HISTORY:  reports that she has never smoked. She has never used smokeless tobacco. She reports that she does not drink alcohol or use illicit drugs.  REVIEW OF SYSTEMS:  POSITIVES IN BOLD Constitutional: Negative for fever, chills, weight loss, malaise/fatigue  and diaphoresis.  HENT: Negative for hearing loss, ear pain, nosebleeds, congestion, sore throat, neck pain, tinnitus and ear discharge.   Eyes: Negative for blurred vision, double vision, photophobia, pain, discharge and redness.  Respiratory: Negative for cough, post-tussive emesis, hemoptysis, sputum production, shortness of breath, wheezing and stridor.   Cardiovascular:  Negative for chest pain, palpitations, orthopnea, claudication, leg swelling and PND.  Gastrointestinal: Negative for heartburn, nausea, vomiting, abdominal pain, diarrhea, constipation, blood in stool and melena.  Genitourinary: Negative for dysuria, urgency, frequency, hematuria and flank pain.  Musculoskeletal: Negative for myalgias, back pain, joint pain and falls.  Skin: Negative for itching and rash.  Neurological: Negative for dizziness, tingling, tremors, sensory change, speech change, focal weakness, seizures, loss of consciousness, weakness and headaches.  Endo/Heme/Allergies: Negative for environmental allergies and polydipsia. Does not bruise/bleed easily.  SUBJECTIVE:   VITAL SIGNS: Temp:  [98 F (36.7 C)-98.4 F (36.9 C)] 98 F (36.7 C) (04/27 0931) Pulse Rate:  [92-107] 103 (04/27 0931) Resp:  [17-40] 40 (04/27 0931) BP: (133-137)/(39-66) 134/61 mmHg (04/27 0931) SpO2:  [93 %-100 %] 93 % (04/27 0931) Weight:  [257 lb 15 oz (117 kg)] 257 lb 15 oz (117 kg) (04/27 6644)  PHYSICAL EXAMINATION: General:  Morbidly obese female in NAD, sitting up at bedside PSY:  Anxious, tearful at times Neuro:  AAOx4, speech clear, MAE HEENT:  MM pink/moist, no jvd, exophthalmus  Cardiovascular:  s1s2 rrr tachy Lungs:  Even/non-labored, lungs bilaterally clear, good air movement,  Significant cough with clear / white sputum production Abdomen:  Obese/soft, bsx4 active, non-tender Musculoskeletal:  No acute deformities  Skin:  Warm/dry, no lesions or rashes, multiple tattoos, no pitting edema in LE's   Recent Labs Lab 04/13/16 0035 04/14/16 0706 04/15/16 0639  NA 135 138 138  K 2.9* 3.3* 3.4*  CL 105 109 108  CO2 18* 21* 21*  BUN <5* 6 <5*  CREATININE 0.37* 0.40* 0.38*  GLUCOSE 103* 107* 118*    Recent Labs Lab 04/13/16 0035 04/14/16 0706 04/15/16 0639  HGB 10.3* 8.3* 8.3*  HCT 32.4* 26.3* 25.4*  WBC 9.3 12.7* 9.8  PLT 253 228 211   No results found.  SIGNIFICANT  EVENTS  4/25  Admit with complaints of SOB, cough  STUDIES:  4/24  CXR 2V >> interval improvement & near complete resolution of multi-focal opacities  4/25  CTA Chest >> moderately motion degraded, no PE identified, ground-glass opacities throughout both lungs (pna vs pulmonary edema), cardiomegaly and pulmonary arterial enlargement   CULTURES: Influenza 4/28 >> negative   ASSESSMENT / PLAN:  Bilateral Ground Glass Opacities - noted on 4/25 CTA of chest, DDx includes pulmonary edema (may be rate related due to hyperthyroidism & hypervolemia with pregnancy), viral pneumonia, acute interstitial pneumonia (AIP), acute eosinophilic lung disease, and early hypersensitivity pneumonia.  Negative PCT goes against acute infectious etiology.    Cough - secondary to above and significant component of anxiety, hyperthyroidism   ? Pulmonary Hypertension - noted enlarged pulmonary vasculature on CT   Plan: Assess ECHO to evaluate for cardiomyopathy  Follow auto-immune evaluation - ANCA, ESR 40, ANA negative, PCT negative Add ACE, SSA/SSB Trend BNP  Can use jolly ranchers / life savers for cough.   Discussed breaking cough cycle with patient - some of cough appears involuntary, at other times seems forced Add differential to am CBC to review eosinophils (low on admit) Will need repeat ECHO post pregnancy to reassess pulmonary pressures Limited cough suppression efforts with medications due to  pregnancy Monitor for sputum production - she reports green but visualized 4/27 is clear to white   Proteinuria - rule out nephrotic syndrome, present prior to pregnancy.    Plan: Defer to IMTS   Hyperthyroidism / Graves Disease   Plan: Medications per IMTS   Attending to follow.     Noe Gens, NP-C [facility] Pgr: [phone] or if no answer [phone] 04/15/2016, 11:58 AM  ATTENDING NOTE / ATTESTATION NOTE :   I have discussed the case with the resident/APP  Noe Gens  I agree with the resident/APP's  history, physical examination, assessment, and plans.  I have edited the above note and modified it according to our agreed history, physical examination, assessment and plan.   Pt is [redacted] weeks pregnant, with h/o Graves dse, h/o thyroid storm, with no thyroid meds x 1.5 weeks, with worsening SOB since 01/2016. Not better on abx given in 01/2016. With worsening SOB, orthopnea since admission. Chest CTA with B GG like infiltrates, cardiomegaly, enlarged PA, no filling defects.  No evidence of infxn.   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 BW score 40 -- pulm edema, HR, precipitant, CNS.  Likely with acute CHF exacerbation, systolic 2/2 above + pregnancy. Doubt infxn at this point.  PU 22 weeks.   Plan : 1. As outlined by Josephine Cables note.  2. I spoke to IMTS re: pt. need to transfer pt to SDU to better watch her re: possible thyroid storm. 3. Correct K, needs diuresis. She just got CTA so we cant over diuresce.  4. F/u echo 5. Suggest propranolol, PTU, +/- steroids for hyperthyroidism 6. May need bipap if with worse resp status.  7. Cont other meds.  8. Needs to be set up with ENDOCRINE service before discharge.  9. She needs close f/u. May decompensate the next 24 hrs. May need intubation at that point.    Family  No family at bedside.    Monica Becton, MD 04/15/2016, 1:46 PM [place] Pager [phone] After 3 pm or if no answer, call [phone]

## 2016-04-15 NOTE — Care Management Note (Signed)
Case Management Note  Patient Details  Name: JAZELL ALLPORT MRN: [id number] Date of Birth: 1991-04-22  Subjective/Objective:                 Unable to speak with patient, RN working with patient at bedside, states patient will be transferred to stepdown.   Action/Plan:   Expected Discharge Date:                  Expected Discharge Plan:  Home/Self Care  In-House Referral:     Discharge planning Services  CM Consult  Post Acute Care Choice:    Choice offered to:     DME Arranged:    DME Agency:     HH Arranged:    HH Agency:     Status of Service:  In process, will continue to follow  Medicare Important Message Given:    Date Medicare IM Given:    Medicare IM give by:    Date Additional Medicare IM Given:    Additional Medicare Important Message give by:     If discussed at Long Length of Stay Meetings, dates discussed:    Additional Comments:  Lawerance Sabal, RN 04/15/2016, 2:54 PM

## 2016-04-15 NOTE — Consult Note (Signed)
  Chart reviewed.  Agree with assessment and plan as per Dr. Shawnie Pons.  I have spoken to the team managing the patient, Dr. Ronaldo Miyamoto __________.  I have advised them to place a PPD on pt. Consider thyroid strom. I have spoken to Dr. Claudean Severance, Maternal Fetal Medicine, to see if they have any additional recommendations but, they agree with our team that this is most likely NOT pregnancy related.  As per our prior conversation following quant HCGs is NOT necessary and is of no value at present. The rapid response OB nurse will f/u with pt again later for FHR assessment as it could not be obtained earlier due to patient coughing spell.  I have reviewed the pulmonologist consult.    Tara [name], M.D., [facility]

## 2016-04-15 NOTE — Progress Notes (Signed)
Doppler FHR 157, called dr Luberta Robertson to update on pt status, and FHR. No new orders received.

## 2016-04-15 NOTE — Progress Notes (Signed)
MD paged to clarify ativan order in pregnancy. MD stated okay to give. Patient still anxious, but not hysterical and tearful like at 1030. MD stated to hold dose and give if patient's anxiety reaches back to where it was at 1030.

## 2016-04-15 NOTE — Progress Notes (Addendum)
Lab contacted to clarify 24hr urine sample and stated they need new order for creatinine clearance and protein urine 24 because last one collected was a "random urine sample" and not a "24 hr collection". MD aware

## 2016-04-15 NOTE — Progress Notes (Signed)
Report given to RN for transfer to [facility]

## 2016-04-16 ENCOUNTER — Observation Stay (HOSPITAL_BASED_OUTPATIENT_CLINIC_OR_DEPARTMENT_OTHER): Payer: Medicaid Other

## 2016-04-16 ENCOUNTER — Ambulatory Visit (HOSPITAL_COMMUNITY): Admission: RE | Admit: 2016-04-16 | Payer: Medicaid Other | Source: Ambulatory Visit

## 2016-04-16 DIAGNOSIS — I501 Left ventricular failure: Secondary | ICD-10-CM | POA: Diagnosis not present

## 2016-04-16 DIAGNOSIS — Z3A22 22 weeks gestation of pregnancy: Secondary | ICD-10-CM | POA: Diagnosis not present

## 2016-04-16 DIAGNOSIS — R0600 Dyspnea, unspecified: Secondary | ICD-10-CM

## 2016-04-16 DIAGNOSIS — Z9114 Patient's other noncompliance with medication regimen: Secondary | ICD-10-CM | POA: Diagnosis not present

## 2016-04-16 DIAGNOSIS — O99012 Anemia complicating pregnancy, second trimester: Secondary | ICD-10-CM | POA: Diagnosis present

## 2016-04-16 DIAGNOSIS — R0602 Shortness of breath: Secondary | ICD-10-CM | POA: Diagnosis present

## 2016-04-16 DIAGNOSIS — O99212 Obesity complicating pregnancy, second trimester: Secondary | ICD-10-CM | POA: Diagnosis present

## 2016-04-16 DIAGNOSIS — I509 Heart failure, unspecified: Secondary | ICD-10-CM | POA: Diagnosis not present

## 2016-04-16 DIAGNOSIS — E0590 Thyrotoxicosis, unspecified without thyrotoxic crisis or storm: Secondary | ICD-10-CM | POA: Diagnosis not present

## 2016-04-16 DIAGNOSIS — J42 Unspecified chronic bronchitis: Secondary | ICD-10-CM | POA: Diagnosis present

## 2016-04-16 DIAGNOSIS — I5023 Acute on chronic systolic (congestive) heart failure: Secondary | ICD-10-CM | POA: Diagnosis present

## 2016-04-16 DIAGNOSIS — Z331 Pregnant state, incidental: Secondary | ICD-10-CM

## 2016-04-16 DIAGNOSIS — E8779 Other fluid overload: Secondary | ICD-10-CM | POA: Diagnosis not present

## 2016-04-16 DIAGNOSIS — Z91041 Radiographic dye allergy status: Secondary | ICD-10-CM | POA: Diagnosis not present

## 2016-04-16 DIAGNOSIS — O99342 Other mental disorders complicating pregnancy, second trimester: Secondary | ICD-10-CM | POA: Diagnosis present

## 2016-04-16 DIAGNOSIS — J810 Acute pulmonary edema: Secondary | ICD-10-CM | POA: Diagnosis not present

## 2016-04-16 DIAGNOSIS — E0500 Thyrotoxicosis with diffuse goiter without thyrotoxic crisis or storm: Secondary | ICD-10-CM | POA: Diagnosis present

## 2016-04-16 DIAGNOSIS — O289 Unspecified abnormal findings on antenatal screening of mother: Secondary | ICD-10-CM | POA: Diagnosis not present

## 2016-04-16 DIAGNOSIS — T382X6A Underdosing of antithyroid drugs, initial encounter: Secondary | ICD-10-CM | POA: Diagnosis present

## 2016-04-16 DIAGNOSIS — E876 Hypokalemia: Secondary | ICD-10-CM | POA: Diagnosis present

## 2016-04-16 DIAGNOSIS — Z91013 Allergy to seafood: Secondary | ICD-10-CM | POA: Diagnosis not present

## 2016-04-16 DIAGNOSIS — F064 Anxiety disorder due to known physiological condition: Secondary | ICD-10-CM | POA: Diagnosis present

## 2016-04-16 DIAGNOSIS — O99282 Endocrine, nutritional and metabolic diseases complicating pregnancy, second trimester: Secondary | ICD-10-CM | POA: Diagnosis not present

## 2016-04-16 DIAGNOSIS — E0580 Other thyrotoxicosis without thyrotoxic crisis or storm: Secondary | ICD-10-CM | POA: Diagnosis not present

## 2016-04-16 DIAGNOSIS — T448X6A Underdosing of centrally-acting and adrenergic-neuron-blocking agents, initial encounter: Secondary | ICD-10-CM | POA: Diagnosis present

## 2016-04-16 DIAGNOSIS — R0902 Hypoxemia: Secondary | ICD-10-CM | POA: Diagnosis present

## 2016-04-16 DIAGNOSIS — O99412 Diseases of the circulatory system complicating pregnancy, second trimester: Secondary | ICD-10-CM | POA: Diagnosis not present

## 2016-04-16 DIAGNOSIS — I110 Hypertensive heart disease with heart failure: Secondary | ICD-10-CM | POA: Diagnosis present

## 2016-04-16 DIAGNOSIS — R809 Proteinuria, unspecified: Secondary | ICD-10-CM | POA: Diagnosis present

## 2016-04-16 DIAGNOSIS — O10112 Pre-existing hypertensive heart disease complicating pregnancy, second trimester: Secondary | ICD-10-CM | POA: Diagnosis present

## 2016-04-16 DIAGNOSIS — O99512 Diseases of the respiratory system complicating pregnancy, second trimester: Secondary | ICD-10-CM | POA: Diagnosis present

## 2016-04-16 DIAGNOSIS — Z6841 Body Mass Index (BMI) 40.0 and over, adult: Secondary | ICD-10-CM | POA: Diagnosis not present

## 2016-04-16 DIAGNOSIS — Z91128 Patient's intentional underdosing of medication regimen for other reason: Secondary | ICD-10-CM | POA: Diagnosis not present

## 2016-04-16 DIAGNOSIS — D509 Iron deficiency anemia, unspecified: Secondary | ICD-10-CM | POA: Diagnosis present

## 2016-04-16 LAB — MPO/PR-3 (ANCA) ANTIBODIES: ANCA Proteinase 3: <3.5 U/mL (ref 0.0–3.5)

## 2016-04-16 LAB — CBC WITH DIFFERENTIAL/PLATELET
BASOS ABS: 0 10*3/uL (ref 0.0–0.1)
Basophils Relative: 0 %
Eosinophils Absolute: 0.2 10*3/uL (ref 0.0–0.7)
Eosinophils Relative: 2 %
HEMATOCRIT: 27 % — AB (ref 36.0–46.0)
Hemoglobin: 8.7 g/dL — ABNORMAL LOW (ref 12.0–15.0)
LYMPHS ABS: 1.9 10*3/uL (ref 0.7–4.0)
Lymphocytes Relative: 16 %
MCH: 22.4 pg — ABNORMAL LOW (ref 26.0–34.0)
MCHC: 32.2 g/dL (ref 30.0–36.0)
MCV: 69.4 fL — ABNORMAL LOW (ref 78.0–100.0)
MONO ABS: 0.8 10*3/uL (ref 0.1–1.0)
MONOS PCT: 7 %
Neutro Abs: 9.1 10*3/uL — ABNORMAL HIGH (ref 1.7–7.7)
Neutrophils Relative %: 75 %
PLATELETS: 255 10*3/uL (ref 150–400)
RBC: 3.89 MIL/uL (ref 3.87–5.11)
RDW: 15.1 % (ref 11.5–15.5)
WBC: 12 10*3/uL — AB (ref 4.0–10.5)

## 2016-04-16 LAB — ECHOCARDIOGRAM COMPLETE
Height: 66 in
Weight: 4021.19 oz

## 2016-04-16 LAB — BASIC METABOLIC PANEL
ANION GAP: 12 (ref 5–15)
BUN: <5 mg/dL — ABNORMAL LOW (ref 6–20)
CALCIUM: 8.5 mg/dL — AB (ref 8.9–10.3)
CO2: 20 mmol/L — ABNORMAL LOW (ref 22–32)
Chloride: 105 mmol/L (ref 101–111)
Creatinine, Ser: 0.4 mg/dL — ABNORMAL LOW (ref 0.44–1.00)
Glucose, Bld: 86 mg/dL (ref 65–99)
Potassium: 3.3 mmol/L — ABNORMAL LOW (ref 3.5–5.1)
Sodium: 137 mmol/L (ref 135–145)

## 2016-04-16 LAB — BRAIN NATRIURETIC PEPTIDE: B Natriuretic Peptide: 99.6 pg/mL (ref 0.0–100.0)

## 2016-04-16 MED ORDER — LABETALOL HCL 200 MG PO TABS: 400.0000 mg | ORAL_TABLET | Freq: Two times a day (BID) | ORAL | Status: DC

## 2016-04-16 MED ORDER — DICLOFENAC SODIUM 1 % TD GEL
2.0000 g | Freq: Four times a day (QID) | TRANSDERMAL | Status: DC
  Administered 2016-04-18 – 2016-04-21 (×2): 2 g via TOPICAL
  Filled 2016-04-16 (×2): qty 100

## 2016-04-16 MED ORDER — ACETAMINOPHEN 500 MG PO TABS
1000.0000 mg | ORAL_TABLET | Freq: Once | ORAL | Status: AC
  Administered 2016-04-16: 1000 mg via ORAL
  Filled 2016-04-16: qty 2

## 2016-04-16 MED ORDER — FUROSEMIDE 10 MG/ML IJ SOLN
40.0000 mg | Freq: Once | INTRAMUSCULAR | Status: AC
  Administered 2016-04-16: 40 mg via INTRAVENOUS
  Filled 2016-04-16: qty 4

## 2016-04-16 MED ORDER — DEXTROMETHORPHAN POLISTIREX ER 30 MG/5ML PO SUER
15.0000 mg | Freq: Two times a day (BID) | ORAL | Status: DC | PRN
  Administered 2016-04-16 – 2016-04-20 (×3): 15 mg via ORAL
  Filled 2016-04-16 (×6): qty 5

## 2016-04-16 MED ORDER — LABETALOL HCL 300 MG PO TABS
300.0000 mg | ORAL_TABLET | Freq: Two times a day (BID) | ORAL | Status: DC
  Administered 2016-04-16 – 2016-04-21 (×10): 300 mg via ORAL
  Filled 2016-04-16 (×3): qty 2
  Filled 2016-04-16 (×4): qty 1
  Filled 2016-04-16: qty 2
  Filled 2016-04-16 (×2): qty 1

## 2016-04-16 MED ORDER — POTASSIUM CHLORIDE CRYS ER 20 MEQ PO TBCR
40.0000 meq | EXTENDED_RELEASE_TABLET | Freq: Once | ORAL | Status: AC
  Administered 2016-04-16: 40 meq via ORAL
  Filled 2016-04-16: qty 2

## 2016-04-16 MED ORDER — BENZONATATE 100 MG PO CAPS
200.0000 mg | ORAL_CAPSULE | Freq: Three times a day (TID) | ORAL | Status: DC | PRN
  Administered 2016-04-18 – 2016-04-20 (×6): 200 mg via ORAL
  Filled 2016-04-16 (×7): qty 2

## 2016-04-16 NOTE — Progress Notes (Signed)
Rapid Response Event Note  Overview: Request from bedside RN for  FHR Assessment    Initial Focused Assessment:  Upon assessment patient was sleeping in bed, woken up to perform FHR assessment.    Interventions: Doppler utilized with no success, ClearTone doppler via ED utilized with a FHR 131.  Event Summary:  FHR obtained  Tara [name]

## 2016-04-16 NOTE — Progress Notes (Signed)
QD doppler performed.  Dr. Macon Large called with today's update.  Notified patient is without OB complaint.  Denies vaginal bleeding, leaking of fluid, or abdominal pain.  Reports good fetal movement.

## 2016-04-16 NOTE — Progress Notes (Signed)
Patient ID: Tara [name], female   DOB: March 01, 1991, 25 y.o.   MRN: [id number]   Subjective: Tara [name]'s coughing has fortunately subsided. Her main complaint is her palpitations and shortness of breath when she tries to walk. She had good questions regarding how long methimazole takes to work and the role of labetalol. She has no other complaints.  Objective: Vital signs in last 24 hours: Filed Vitals:   04/15/16 2330 04/16/16 0424 04/16/16 0500 04/16/16 0751  BP: 134/52 129/54  133/68  Pulse: 109   103  Temp: 98.1 F (36.7 C) 98.2 F (36.8 C)  98.4 F (36.9 C)  TempSrc: Oral Oral  Oral  Resp: Height:      Weight:   251 lb 5.2 oz (114 kg)   SpO2: 92% 93%  90%   Weight change: -6 lb 9.8 oz (-3 kg)  Intake/Output Summary (Last 24 hours) at 04/16/16 1042 Last data filed at 04/15/16 2000  Gross per 24 hour  Intake    418 ml  Output   3350 ml  Net  -2932 ml   General: anxious but friendly lady resting in bed HEENT: proptosis, goiter with bruit Cardiac: tachycardic but regular rhythm, no rubs, murmurs or gallops Pulm: breathing well, bibasilar crackles Ext: warm and well perfused, without pedal edema Skin: no rash, hair, or nail changes  Lab Results: Basic Metabolic Panel:  Recent Labs Lab 04/13/16 1030 04/14/16 0706 04/15/16 0639 04/16/16 0413  NA  --  138 138 137  K  --  3.3* 3.4* 3.3*  CL  --  109 108 105  CO2  --  21* 21* 20*  GLUCOSE  --  107* 118* 86  BUN  --  6 <5* <5*  CREATININE  --  0.40* 0.38* 0.40*  CALCIUM  --  8.5* 8.4* 8.5*  MG 1.6* 1.8  --   --   PHOS  --  5.2*  --   --    CBC:  Recent Labs Lab 04/13/16 0035  04/15/16 0639 04/16/16 0413  WBC 9.3  < > 9.8 12.0*  NEUTROABS 5.9  --   --  9.1*  HGB 10.3*  < > 8.3* 8.7*  HCT 32.4*  < > 25.4* 27.0*  MCV 67.9*  < > 69.0* 69.4*  PLT 253  < > 211 255  < > = values in this interval not displayed.   Medications: I have reviewed the patient's current medications. Scheduled Meds: .  aspirin  81 mg Oral Daily  . diclofenac sodium  2 g Topical QID  . enoxaparin (LOVENOX) injection  40 mg Subcutaneous Q24H  . ferrous sulfate  325 mg Oral Q breakfast  . furosemide  40 mg Intravenous Once  . labetalol  400 mg Oral BID  . methimazole  5 mg Oral TID  . pantoprazole  40 mg Oral BID  . prenatal multivitamin  1 tablet Oral Q1200  . sodium chloride flush  3 mL Intravenous Q12H   Continuous Infusions:  PRN Meds:.benzonatate, dextromethorphan, ondansetron (ZOFRAN) IV Assessment/Plan:  Tara [name] is a 25 year old pregnant lady at [redacted] weeks gestation with Grave's disease here with hyperthyroidism in pregnancy.  Hyperthyroidism in pregnancy: I think her dyspnea, tachycardia, and hypertension are primarily explained by hyperthyroidism from not taking her methimazole nor labetalol for the last few weeks. Methimazole will take about 2 weeks to kick in, so we will continue at the current dose; in the meantime, we will up-titrate the  labetalol to control her symptoms. I do not think she is in thyroid storm as she is afebrile, not diaphoretic, and is clinically improving, albeit slowly. She does have bibasilar crackles on exam today, suggestive of high cardiac output heart failure related to her hyperthyroidism. She clinically responded well to furosemide yesterday, so we will give her another dose now. A transthoracic echocardiogram will be valuable to further assess. I do not think this is bacterial pneumonia because her procalcitonin was normal. Her leukocytosis slightly up-ticked today but this is most likely a stress response. If she continues to do well today, we will likely transfer her out of step-down today. -Thanks Pulmonology and OB/GYN for your thoughtful assistance in caring for Tara [name] -Increased labetalol from 300 to 400mg  twice daily -Gave another dose of furosemide 40mg  IV once -Follow transthoracic echocardiogram -She will need outpatient follow-up with  endocrinology  Proteinuria: Fortunately her 24-hour protein was normal at 21.  Dispo: Disposition is deferred at this time, awaiting improvement of current medical problems.  Anticipated discharge in approximately 2-3 day(s).   The patient does not have a current PCP (No Pcp Per Patient) and does need an [facility] hospital follow-up appointment after discharge.  The patient does not know have transportation limitations that hinder transportation to clinic appointments.  .Services Needed at time of discharge: Y = Yes, Blank = No PT:   OT:   RN:   Equipment:   Other:     LOS: 3 days   Selina Cooley, MD 04/16/2016, 10:42 AM

## 2016-04-16 NOTE — Consult Note (Signed)
Name: Tara [name] MRN: [id number] DOB: [birth year]    ADMISSION DATE:  04/13/2016 CONSULTATION DATE:  04/15/16  REFERRING MD :  Dr. Heide Spark  CHIEF COMPLAINT:  Abnormal CT Chest     HISTORY OF PRESENT ILLNESS:  25 y/o F with PMH of obesity, hyperthyroidism / Graves Disease (dx 2012), medically non-compliant with beta blocker / methimazole, HTN, anemia, headaches, chronic bronchitis, PNA, prior cesarean section and [redacted] weeks pregnant (G3P2A0) who presented to [facility] on 4/25 with complaints of cough and shortness of breath.    She was admitted in February 2017 for PNA and hyperthyroidism symptoms.  After discharge, she reports she developed cough which has been ongoing.  The patient reports running out of thyroid medications and has not been taking for ~ 2 weeks.   The patient reported on admission that she was told she could not return to work until she was seen which prompted evaluation.  She reports cough with green sputum production.  Initial ER evaluation notable for ambulatory O2 assessment where saturations remained >97% with exertion.  She had a coughing episode and sats decreased to 93% but recovered quickly.  Initial labs - Na 135, K 2.9, Mg 1.6, Cl 105, CO2 18, sr cr 0.37, glucose 103, BNP 70.2, WBC 9.3, Hgb 10.3, MCV 67.9, and platelets 253. D-dimer assessed - 0.60 (mild elevation).  Due to elevated D-Dimer, a CTA was assessed which was moderately motion degraded, no PE identified, ground-glass opacities throughout both lungs (pna vs pulmonary edema), cardiomegaly and pulmonary arterial enlargement.    Due to abnormal CT findings, PCCM consulted for evaluation.   SUBJECTIVE: little better breathing with neg 3 liters  VITAL SIGNS: Temp:  [97.8 F (36.6 C)-98.4 F (36.9 C)] 98.1 F (36.7 C) (04/28 1200) Pulse Rate:  [89-117] 89 (04/28 1200) Resp:  [15-35] 27 (04/28 1200) BP: (107-141)/(51-68) 107/51 mmHg (04/28 1200) SpO2:  [90 %-98 %] 98 % (04/28 1200) Weight:  [114 kg (251  lb 5.2 oz)] 114 kg (251 lb 5.2 oz) (04/28 0500)  PHYSICAL EXAMINATION: General:  Morbidly obese female in NAD, calm Neuro:  AAOx4, speech clear, MAE HEENT:  MM pink/moist, got jvd Cardiovascular:  s1s2 rrr tachy mild Lungs:  Even/non-labored, cta, int crackles  Abdomen:  Obese/soft, bsx4 active, non-tender Musculoskeletal:  No acute deformities  Skin:  Warm/dry, no lesions or rashes, multiple tattoos, no pitting edema in LE's   Recent Labs Lab 04/14/16 0706 04/15/16 0639 04/16/16 0413  NA 138 138 137  K 3.3* 3.4* 3.3*  CL 109 108 105  CO2 21* 21* 20*  BUN 6 <5* <5*  CREATININE 0.40* 0.38* 0.40*  GLUCOSE 107* 118* 86    Recent Labs Lab 04/14/16 0706 04/15/16 0639 04/16/16 0413  HGB 8.3* 8.3* 8.7*  HCT 26.3* 25.4* 27.0*  WBC 12.7* 9.8 12.0*  PLT 228 211 255   No results found.  SIGNIFICANT EVENTS  4/25  Admit with complaints of SOB, cough  STUDIES:  4/24  CXR 2V >> interval improvement & near complete resolution of multi-focal opacities  4/25  CTA Chest >> moderately motion degraded, no PE identified, ground-glass opacities throughout both lungs (pna vs pulmonary edema), cardiomegaly and pulmonary arterial enlargement   CULTURES: Influenza 4/28 >> negative   ASSESSMENT / PLAN:  Bilateral Ground Glass Opacities - noted on 4/25 CTA of chest, DDx includes pulmonary edema (may be rate related due to hyperthyroidism & hypervolemia with pregnancy), viral pneumonia, acute interstitial pneumonia (AIP), acute eosinophilic lung disease, and early hypersensitivity  pneumonia.  Negative PCT goes against acute infectious etiology.    Cough - secondary to above and significant component of anxiety, hyperthyroidism   ? Pulmonary Hypertension - noted enlarged pulmonary vasculature on CT   Plan: Favor this as edema, 3 liters neg has room for more diuresis Await echo, would have concerns cardiomyopathy from thyroid dz vs peri partum ( although a bit ealry for this) Keep neg  balance Maintain treatment graves, not c/w with storm NOT impressed PNA / atypical   Hyperthyroidism / Graves Disease   Plan: Medications per IMTS  Will follow up Monday call if needed  Mcarthur Rossetti. Tyson Alias, MD, FACP Pgr: [phone] [facility]

## 2016-04-16 NOTE — Progress Notes (Signed)
04/16/2016 Echo tech told nurse patient was c/o chest pain at 0950. Rn went to assess pain she stated pain was a 5 on left chest and she had some tightness. Family medicine teaching service was call and made aware. Dr Earnest Conroy return call and he was made aware 12 Lead EKG was ordered and gave an order to give patient Tylenol 1000 mg times one. The result of the EKG was Normal Sinus rhythm with vent rate 99. Internal medicine teaching service was made aware of result.  Patient was eventually reassess she reported her pain had resolve. [facility] RN.

## 2016-04-16 NOTE — Progress Notes (Signed)
Subjective: Interval History:has been seen by pulmonary and more tests are resulted. Cardiac ECHO pending. She has received lasix and feels better. Increasing her BBlocker has improved her HR. She is reporting Fetal movement.  Objective: Vital signs in last 24 hours: Temp:  [97.8 F (36.6 C)-98.4 F (36.9 C)] 98.1 F (36.7 C) (04/28 1200) Pulse Rate:  [89-117] 89 (04/28 1200) Resp:  [15-52] 27 (04/28 1200) BP: (107-141)/(51-68) 107/51 mmHg (04/28 1200) SpO2:  [90 %-98 %] 98 % (04/28 1200) Weight:  [251 lb 5.2 oz (114 kg)] 251 lb 5.2 oz (114 kg) (04/28 0500)  Intake/Output from previous day: 04/27 0701 - 04/28 0700 In: 421 [P.O.:218; I.V.:3] Out: 3350 [Urine:3350] Intake/Output this shift:    General appearance: alert, cooperative, appears stated age, moderately obese and she can speak in complete sentences now and she is no longer vibrating Eyes: proptosis of right eye > left Neck: enlarged thyroid Lungs: less tachypnic Abdomen: gravid, Non-tender Extremities: Homans sign is negative, no sign of DVT  Results for orders placed or performed during the hospital encounter of 04/13/16 (from the past 24 hour(s))  hCG, quantitative, pregnancy     Status: Abnormal   Collection Time: 04/15/16  1:08 PM  Result Value Ref Range   hCG, Beta Chain, Quant, S 57601 (H) <5 mIU/mL  Lactic acid, plasma     Status: None   Collection Time: 04/15/16  1:17 PM  Result Value Ref Range   Lactic Acid, Venous 1.0 0.5 - 2.0 mmol/L  MRSA PCR Screening     Status: None   Collection Time: 04/15/16  6:03 PM  Result Value Ref Range   MRSA by PCR NEGATIVE NEGATIVE  CBC with Differential/Platelet     Status: Abnormal   Collection Time: 04/16/16  4:13 AM  Result Value Ref Range   WBC 12.0 (H) 4.0 - 10.5 K/uL   RBC 3.89 3.87 - 5.11 MIL/uL   Hemoglobin 8.7 (L) 12.0 - 15.0 g/dL   HCT 37.3 (L) 42.8 - 76.8 %   MCV 69.4 (L) 78.0 - 100.0 fL   MCH 22.4 (L) 26.0 - 34.0 pg   MCHC 32.2 30.0 - 36.0 g/dL   RDW  11.5 72.6 - 20.3 %   Platelets 255 150 - 400 K/uL   Neutrophils Relative % 75 %   Lymphocytes Relative 16 %   Monocytes Relative 7 %   Eosinophils Relative 2 %   Basophils Relative 0 %   Neutro Abs 9.1 (H) 1.7 - 7.7 K/uL   Lymphs Abs 1.9 0.7 - 4.0 K/uL   Monocytes Absolute 0.8 0.1 - 1.0 K/uL   Eosinophils Absolute 0.2 0.0 - 0.7 K/uL   Basophils Absolute 0.0 0.0 - 0.1 K/uL   RBC Morphology POLYCHROMASIA PRESENT   Basic metabolic panel     Status: Abnormal   Collection Time: 04/16/16  4:13 AM  Result Value Ref Range   Sodium 137 135 - 145 mmol/L   Potassium 3.3 (L) 3.5 - 5.1 mmol/L   Chloride 105 101 - 111 mmol/L   CO2 20 (L) 22 - 32 mmol/L   Glucose, Bld 86 65 - 99 mg/dL   BUN <5 (L) 6 - 20 mg/dL   Creatinine, Ser 5.59 (L) 0.44 - 1.00 mg/dL   Calcium 8.5 (L) 8.9 - 10.3 mg/dL   GFR calc non Af Amer >60 >60 mL/min   GFR calc Af Amer >60 >60 mL/min   Anion gap 12 5 - 15  Brain natriuretic peptide  Status: None   Collection Time: 04/16/16  4:13 AM  Result Value Ref Range   B Natriuretic Peptide 99.6 0.0 - 100.0 pg/mL   24 hour urine shows 252 mg protein--this is not indicative of 4+ proteinuria. ANA is negative   Scheduled Meds: . aspirin  81 mg Oral Daily  . diclofenac sodium  2 g Topical QID  . enoxaparin (LOVENOX) injection  40 mg Subcutaneous Q24H  . ferrous sulfate  325 mg Oral Q breakfast  . labetalol  400 mg Oral BID  . methimazole  5 mg Oral TID  . pantoprazole  40 mg Oral BID  . prenatal multivitamin  1 tablet Oral Q1200  . sodium chloride flush  3 mL Intravenous Q12H   Continuous Infusions:  PRN Meds:benzonatate, dextromethorphan, ondansetron (ZOFRAN) IV  Assessment/Plan: Active Problems:   HCAP (healthcare-associated pneumonia)   Dyspnea   Hyperthyroidism   Noncompliance   Pregnant   Acute pulmonary edema (HCC)   Acute on chronic systolic congestive heart failure (HCC)  No acute obstetrical issues - no need for HCG's Continue control of thyroid  and emphasize need for medical compliance Await ECHO results Agree with HR control and diuresis Ruling out auto-immune issues Will sign off for now--please continue to call us for urgent needs at [phone]. F/u with OB/GYN as an outpatient pending discharge--my office will call to schedule this.    LOS: 3 days   Reva Bores, MD 04/16/2016 12:57 PM

## 2016-04-16 NOTE — Progress Notes (Signed)
Patient ID: Tara [name], female   DOB: [birth year], 25 y.o.   MRN: [id number]  Her pressures  and pulse seem to be responding well to labetalol 300mg  twice daily; she's currently 130/70, pulse 90, I'll drop her back down to the original dose for now. Her transthoracic echocardiogram was reassuring , as her ejection fraction has improved from 45-50% back in February to to 55-60% today. We'll keep her in step-down tonight to be safe.  Selina Cooley MD

## 2016-04-16 NOTE — Progress Notes (Signed)
  Echocardiogram 2D Echocardiogram has been performed.  Leta Jungling M 04/16/2016, 10:36 AM

## 2016-04-17 DIAGNOSIS — I501 Left ventricular failure: Secondary | ICD-10-CM

## 2016-04-17 LAB — BASIC METABOLIC PANEL
Anion gap: 9 (ref 5–15)
CHLORIDE: 107 mmol/L (ref 101–111)
CO2: 22 mmol/L (ref 22–32)
Calcium: 8.8 mg/dL — ABNORMAL LOW (ref 8.9–10.3)
Creatinine, Ser: 0.4 mg/dL — ABNORMAL LOW (ref 0.44–1.00)
GFR calc Af Amer: >60 mL/min (ref >60)
GFR calc non Af Amer: >60 mL/min (ref >60)
GLUCOSE: 87 mg/dL (ref 65–99)
POTASSIUM: 3.7 mmol/L (ref 3.5–5.1)
Sodium: 138 mmol/L (ref 135–145)

## 2016-04-17 LAB — SJOGRENS SYNDROME-B EXTRACTABLE NUCLEAR ANTIBODY: SSB (La) (ENA) Antibody, IgG: <0.2 AI (ref 0.0–0.9)

## 2016-04-17 LAB — ANGIOTENSIN CONVERTING ENZYME: ANGIOTENSIN-CONVERTING ENZYME: 66 U/L (ref 14–82)

## 2016-04-17 LAB — ANTI-SCLERODERMA ANTIBODY: Scleroderma (Scl-70) (ENA) Antibody, IgG: <0.2 AI (ref 0.0–0.9)

## 2016-04-17 LAB — SJOGRENS SYNDROME-A EXTRACTABLE NUCLEAR ANTIBODY: SSA (Ro) (ENA) Antibody, IgG: <0.2 AI (ref 0.0–0.9)

## 2016-04-17 MED ORDER — FUROSEMIDE 10 MG/ML IJ SOLN
40.0000 mg | Freq: Once | INTRAMUSCULAR | Status: AC
  Administered 2016-04-17: 40 mg via INTRAVENOUS
  Filled 2016-04-17: qty 4

## 2016-04-17 NOTE — Progress Notes (Signed)
Spoke with Dr. Jolayne Panther. Pt denies vaginal bleeding, cramping or contractions, and leaking of fluid. Says she feels the baby move.

## 2016-04-17 NOTE — Evaluation (Signed)
Physical Therapy Evaluation Patient Details Name: Tara [name] MRN: [id number] DOB: [birth year] Today's Date: 04/17/2016   History of Present Illness  Pt is a 25 y/o pregnant female admitted w/ hypoxia from pulmonary edema related to hyperthyroidism in pregnancy.  Pt's PMH includes obesity, graves disease, anemia.    Clinical Impression  Pt admitted with above diagnosis. Pt currently with functional limitations due to the deficits listed below (see PT Problem List). Berenise presents w/ mild impairments w/ dynamic balance but it mainly limited by her cardiopulmonary status.  SpO2 dropped as low as 85% on RA while ambulating, pt educated on pursed lip breathing technique. Pt will benefit from skilled PT, specifically energy conservation techniques, to increase their independence and safety with mobility to allow discharge to the venue listed below.      Follow Up Recommendations No PT follow up;Supervision for mobility/OOB    Equipment Recommendations  None recommended by PT    Recommendations for Other Services       Precautions / Restrictions Precautions Precaution Comments: watch O2 Restrictions Weight Bearing Restrictions: No      Mobility  Bed Mobility Overal bed mobility: Independent             General bed mobility comments: no cues or physical assist needed  Transfers Overall transfer level: Needs assistance Equipment used: None Transfers: Sit to/from Stand Sit to Stand: Supervision         General transfer comment: Supervision for safety.  No instability noted.  Ambulation/Gait Ambulation/Gait assistance: Min guard Ambulation Distance (Feet): 60 Feet Assistive device: None Gait Pattern/deviations: Step-through pattern;Decreased stride length   Gait velocity interpretation: Below normal speed for age/gender General Gait Details: DOE 2/4 while ambulating and pt taking rapid shallow breaths w/ SpO2 dropping as low as 85% on RA.  Pt educated on pursed lip  breathing but needs max cues to continue this technique.  O2 donned at 1L O2 w/ SpO2 up to mid 90's at end of session.  Stairs            Wheelchair Mobility    Modified Rankin (Stroke Patients Only)       Balance Overall balance assessment: Needs assistance Sitting-balance support: No upper extremity supported;Feet supported Sitting balance-Leahy Scale: Good     Standing balance support: No upper extremity supported;During functional activity Standing balance-Leahy Scale: Good                   Standardized Balance Assessment Standardized Balance Assessment : Dynamic Gait Index   Dynamic Gait Index Level Surface: Mild Impairment (decreased gait speed) Gait with Horizontal Head Turns: Normal Gait with Vertical Head Turns: Normal Gait and Pivot Turn: Mild Impairment Step Over Obstacle: Normal       Pertinent Vitals/Pain Pain Assessment: No/denies pain    Home Living Family/patient expects to be discharged to:: Private residence Living Arrangements: Non-relatives/Friends (friend and friend's father) Available Help at Discharge: Friend(s);Available PRN/intermittently Type of Home: House Home Access: Stairs to enter Entrance Stairs-Rails: None Entrance Stairs-Number of Steps: 4 Home Layout: One level Home Equipment: None      Prior Function Level of Independence: Independent         Comments: working in a [employer]        Extremity/Trunk Assessment   Upper Extremity Assessment: Overall WFL for tasks assessed           Lower Extremity Assessment: Overall WFL for tasks assessed  Communication   Communication: No difficulties  Cognition Arousal/Alertness: Awake/alert Behavior During Therapy: WFL for tasks assessed/performed Overall Cognitive Status: Within Functional Limits for tasks assessed                      General Comments      Exercises Other Exercises Other Exercises: Pt encouraged to  ambulate in hallway w/ nursing staff at least 3x/day      Assessment/Plan    PT Assessment Patient needs continued PT services  PT Diagnosis Difficulty walking   PT Problem List Decreased activity tolerance;Decreased balance;Cardiopulmonary status limiting activity;Obesity  PT Treatment Interventions DME instruction;Gait training;Stair training;Functional mobility training;Therapeutic activities;Therapeutic exercise;Balance training;Patient/family education;Other (comment) (energy conservation techniques)   PT Goals (Current goals can be found in the Care Plan section) Acute Rehab PT Goals Patient Stated Goal: to be able to breathe better PT Goal Formulation: With patient Time For Goal Achievement: 05/01/16 Potential to Achieve Goals: Good    Frequency Min 3X/week   Barriers to discharge Inaccessible home environment;Decreased caregiver support steps to enter home and intermittent assist    Co-evaluation               End of Session Equipment Utilized During Treatment: Oxygen;Gait belt Activity Tolerance: Treatment limited secondary to medical complications (Comment) (hypoxia) Patient left: in bed;with call bell/phone within reach Nurse Communication: Mobility status;Other (comment) (SpO2)         Time: 1109-1130 PT Time Calculation (min) (ACUTE ONLY): 21 min   Charges:   PT Evaluation $PT Eval Low Complexity: 1 Procedure     PT G Codes:       Encarnacion Chu PT, DPT  Pager: [phone] Phone: [phone] 04/17/2016, 12:47 PM

## 2016-04-17 NOTE — Progress Notes (Signed)
Patient ID: Tara [name], female   DOB: 04-07-1991, 25 y.o.   MRN: [id number]   Subjective: Tara [name]'s tachycardia, dyspnea, and orthopnea continue to slowly improve. She thinks the Lasix is helping. She would like to get up and walk a little bit.  Objective: Vital signs in last 24 hours: Filed Vitals:   04/16/16 2015 04/16/16 2217 04/17/16 0016 04/17/16 0314  BP: 133/70 145/69 113/41 97/76  Pulse:  99 97 93  Temp: 97.5 F (36.4 C)  98.2 F (36.8 C) 98.2 F (36.8 C)  TempSrc: Oral  Oral Oral  Resp:   38 35  Height:      Weight:    257 lb 15 oz (117 kg)  SpO2:   97% 97%   General: anxious but friendly lady resting in bed HEENT: proptosis, goiter with bruit Cardiac: tachycardic but regular rhythm, no rubs, murmurs or gallops Pulm: breathing well, bibasilar crackles still present but improved Ext: warm and well perfused, without pedal edema Skin: no rash, hair, or nail changes  Lab Results: Basic Metabolic Panel:  Recent Labs Lab 04/13/16 1030 04/14/16 0706  04/16/16 0413 04/17/16 0441  NA  --  138  < > 137 138  K  --  3.3*  < > 3.3* 3.7  CL  --  109  < > 105 107  CO2  --  21*  < > 20* 22  GLUCOSE  --  107*  < > 86 87  BUN  --  6  < > <5* <5*  CREATININE  --  0.40*  < > 0.40* 0.40*  CALCIUM  --  8.5*  < > 8.5* 8.8*  MG 1.6* 1.8  --   --   --   PHOS  --  5.2*  --   --   --   < > = values in this interval not displayed.   CBC:  Recent Labs Lab 04/13/16 0035  04/15/16 0639 04/16/16 0413  WBC 9.3  < > 9.8 12.0*  NEUTROABS 5.9  --   --  9.1*  HGB 10.3*  < > 8.3* 8.7*  HCT 32.4*  < > 25.4* 27.0*  MCV 67.9*  < > 69.0* 69.4*  PLT 253  < > 211 255  < > = values in this interval not displayed.  Medications: I have reviewed the patient's current medications. Scheduled Meds: . aspirin  81 mg Oral Daily  . diclofenac sodium  2 g Topical QID  . enoxaparin (LOVENOX) injection  40 mg Subcutaneous Q24H  . ferrous sulfate  325 mg Oral Q breakfast  . furosemide   40 mg Intravenous Once  . labetalol  300 mg Oral BID  . methimazole  5 mg Oral TID  . pantoprazole  40 mg Oral BID  . prenatal multivitamin  1 tablet Oral Q1200  . sodium chloride flush  3 mL Intravenous Q12H   Continuous Infusions:  PRN Meds:.benzonatate, dextromethorphan, ondansetron (ZOFRAN) IV   Assessment/Plan:  Tara [name] is a 25 year old pregnant lady at [redacted] weeks gestation with Grave's disease here with hypoxia from pulmonary edema related to hyperthyroidism in pregnancy.  Dyspnea and hypoxia related to hyperthyroid-induced pulmonary edema from high-output heart failure in pregnancy: Her dyspnea and hypoxia are slowly with diuresis; she continues to have some bibasilar rales so we'll continue with diuresis today. We'll watch her pressures closely; if she becomes hypotensive, we will switch her from labetalol to propanolol but she's fine for now. Her transthoracic echo yesterday fortunately  did not show cardiomyopathy. I'd like to get her out of bed more frequently. -Thanks Pulmonology and OB/GYN for your thoughtful assistance -Gave another dose of furosemide  IV once -Continue labetalol 300 twice daily -Continue methimazole  three times daily -Consulted physical therapy to help walk her -Assist to chair -She will need outpatient follow-up with endocrinology  Dispo: Disposition is deferred at this time, awaiting improvement of current medical problems.  Anticipated discharge in approximately 2-3 day(s).   The patient does not have a current PCP (No Pcp Per Patient) and does need an [facility] hospital follow-up appointment after discharge.  The patient does have transportation limitations that hinder transportation to clinic appointments.  .Services Needed at time of discharge: Y = Yes, Blank = No PT:   OT:   RN:   Equipment:   Other:     LOS: 4 days   Selina Cooley, MD 04/17/2016, 7:51 AM

## 2016-04-18 DIAGNOSIS — Z3A22 22 weeks gestation of pregnancy: Secondary | ICD-10-CM

## 2016-04-18 DIAGNOSIS — O99412 Diseases of the circulatory system complicating pregnancy, second trimester: Secondary | ICD-10-CM

## 2016-04-18 DIAGNOSIS — R0609 Other forms of dyspnea: Secondary | ICD-10-CM

## 2016-04-18 DIAGNOSIS — I509 Heart failure, unspecified: Secondary | ICD-10-CM

## 2016-04-18 DIAGNOSIS — R000 Tachycardia, unspecified: Secondary | ICD-10-CM | POA: Insufficient documentation

## 2016-04-18 DIAGNOSIS — O99512 Diseases of the respiratory system complicating pregnancy, second trimester: Secondary | ICD-10-CM

## 2016-04-18 DIAGNOSIS — E0580 Other thyrotoxicosis without thyrotoxic crisis or storm: Secondary | ICD-10-CM

## 2016-04-18 LAB — BASIC METABOLIC PANEL
ANION GAP: 9 (ref 5–15)
BUN: <5 mg/dL — ABNORMAL LOW (ref 6–20)
CALCIUM: 8.8 mg/dL — AB (ref 8.9–10.3)
CO2: 22 mmol/L (ref 22–32)
CREATININE: 0.38 mg/dL — AB (ref 0.44–1.00)
Chloride: 105 mmol/L (ref 101–111)
Glucose, Bld: 90 mg/dL (ref 65–99)
Potassium: 3.9 mmol/L (ref 3.5–5.1)
SODIUM: 136 mmol/L (ref 135–145)

## 2016-04-18 MED ORDER — FUROSEMIDE 10 MG/ML IJ SOLN
40.0000 mg | Freq: Once | INTRAMUSCULAR | Status: AC
  Administered 2016-04-18: 40 mg via INTRAVENOUS
  Filled 2016-04-18: qty 4

## 2016-04-18 NOTE — Progress Notes (Signed)
Spoke with Dr. Sherryl Barters. Pt has no obstetrical complaints. Denies vaginal bleeding. Leaking of fluid, or uc's. FHR 152 BPM by doppler

## 2016-04-18 NOTE — Progress Notes (Signed)
Patient ID: Tara [name], female   DOB: May 17, 1991, 25 y.o.   MRN: [id number]   Subjective: [name]'s tachycardia and dyspnea continue to improve with diuresis. She walked around yesterday and desatted to 82% but felt fine. She has no complaints.  Objective: Vital signs in last 24 hours: Filed Vitals:   04/18/16 0351 04/18/16 0813 04/18/16 1000 04/18/16 1012  BP: 98/62 111/52    Pulse: 94 94  105  Temp: 98 F (36.7 C) 98.7 F (37.1 C)    TempSrc: Oral Oral    Resp: 28 18    Height:      Weight:   246 lb 4.1 oz (111.7 kg)   SpO2: 96% 97%     General: anxious but friendly lady resting in bed HEENT: proptosis, goiter with bruit Cardiac: tachycardic but regular rhythm, no rubs, murmurs or gallops Pulm: breathing well, bibasilar crackles still present but improved Ext: warm and well perfused, without pedal edema Skin: no rash, hair, or nail changes  Lab Results: Basic Metabolic Panel:  Recent Labs Lab 04/13/16 1030 04/14/16 0706  04/17/16 0441 04/18/16 0233  NA  --  138  < > 138 136  K  --  3.3*  < > 3.7 3.9  CL  --  109  < > 107 105  CO2  --  21*  < > 22 22  GLUCOSE  --  107*  < > 87 90  BUN  --  6  < > <5* <5*  CREATININE  --  0.40*  < > 0.40* 0.38*  CALCIUM  --  8.5*  < > 8.8* 8.8*  MG 1.6* 1.8  --   --   --   PHOS  --  5.2*  --   --   --   < > = values in this interval not displayed.   Medications: I have reviewed the patient's current medications. Scheduled Meds: . aspirin  81 mg Oral Daily  . diclofenac sodium  2 g Topical QID  . enoxaparin (LOVENOX) injection  40 mg Subcutaneous Q24H  . ferrous sulfate  325 mg Oral Q breakfast  . furosemide  40 mg Intravenous Once  . labetalol  300 mg Oral BID  . methimazole  5 mg Oral TID  . pantoprazole  40 mg Oral BID  . prenatal multivitamin  1 tablet Oral Q1200  . sodium chloride flush  3 mL Intravenous Q12H   Continuous Infusions:  PRN Meds:.benzonatate, dextromethorphan, ondansetron (ZOFRAN) IV    Assessment/Plan:  Tara [name] is a 25 year old pregnant lady at [redacted] weeks gestation with Grave's disease here with hypoxia from pulmonary edema related to hyperthyroidism in pregnancy.  Dyspnea and hypoxia related to hyperthyroid-induced pulmonary edema from high-output heart failure in pregnancy: Her dyspnea and hypoxia continue to slowly improve with diuresis; she is still desaturating when walking and has some bibasilar rales so we'll continue with diuresis today. We'll watch her pressures closely; if she becomes hypotensive, we will switch her from labetalol to propanolol but she's fine for now. She's stable to transfer to the floor today. -Transfer to floor today -Thanks Pulmonology and OB/GYN for your thoughtful assistance -Gave another dose of furosemide 40mg  IV once -Continue labetalol 300 twice daily -Continue methimazole 5mg  three times daily -Consulted physical therapy to help walk her -Assist to chair -She will need very close outpatient follow-up with endocrinology -We will schedule her to see [place] in clinic as well  Dispo: Disposition is deferred at this time,  awaiting improvement of current medical problems.  Anticipated discharge in approximately 1-2 day(s).   The patient does not have a current PCP (No Pcp Per Patient) and does need an [facility] hospital follow-up appointment after discharge.  The patient does not know have transportation limitations that hinder transportation to clinic appointments.  .Services Needed at time of discharge: Y = Yes, Blank = No PT:   OT:   RN:   Equipment:   Other:     LOS: 5 days   Tara Cooley, MD 04/18/2016, 10:13 AM

## 2016-04-18 NOTE — Progress Notes (Signed)
SATURATION QUALIFICATIONS: (This note is used to comply with regulatory documentation for home oxygen)  Patient Saturations on Room Air at Rest = 93%  Patient Saturations on Room Air while Ambulating = 87%  Please briefly explain why patient needs home oxygen:

## 2016-04-19 DIAGNOSIS — E8779 Other fluid overload: Secondary | ICD-10-CM

## 2016-04-19 DIAGNOSIS — R0902 Hypoxemia: Secondary | ICD-10-CM

## 2016-04-19 LAB — BASIC METABOLIC PANEL
ANION GAP: 9 (ref 5–15)
BUN: <5 mg/dL — ABNORMAL LOW (ref 6–20)
CALCIUM: 8.8 mg/dL — AB (ref 8.9–10.3)
CO2: 22 mmol/L (ref 22–32)
CREATININE: 0.42 mg/dL — AB (ref 0.44–1.00)
Chloride: 105 mmol/L (ref 101–111)
Glucose, Bld: 94 mg/dL (ref 65–99)
Potassium: 3.6 mmol/L (ref 3.5–5.1)
SODIUM: 136 mmol/L (ref 135–145)

## 2016-04-19 LAB — ANCA TITERS
C-ANCA: <1:20 {titer}
P-ANCA: <1:20 {titer}

## 2016-04-19 MED ORDER — METHIMAZOLE 10 MG PO TABS
10.0000 mg | ORAL_TABLET | Freq: Three times a day (TID) | ORAL | Status: DC
  Administered 2016-04-19 – 2016-04-21 (×6): 10 mg via ORAL
  Filled 2016-04-19 (×8): qty 1

## 2016-04-19 MED ORDER — FUROSEMIDE 40 MG PO TABS
40.0000 mg | ORAL_TABLET | Freq: Two times a day (BID) | ORAL | Status: DC
  Administered 2016-04-19 – 2016-04-21 (×5): 40 mg via ORAL
  Filled 2016-04-19 (×5): qty 1

## 2016-04-19 NOTE — Progress Notes (Signed)
Patient ID: Tara [name], female   DOB: [birth year], 25 y.o.   MRN: [id number]   Subjective: Tara [name]'s tachycardia and dyspnea continue to improve with diuresis. She walked around yesterday and desatted to 85% but felt fine. Reports some dyspnea at rest this morning.   Objective: Vital signs in last 24 hours: Filed Vitals:   04/18/16 1510 04/18/16 2054 04/19/16 0500 04/19/16 0507  BP: 126/64 124/60  115/46  Pulse: 89 82  87  Temp: 97.7 F (36.5 C) 98.3 F (36.8 C)  98 F (36.7 C)  TempSrc: Oral Oral  Oral  Resp: Height:      Weight:   245 lb 7 oz (111.33 kg)   SpO2: 98% 97%  98%   General: anxious but friendly lady resting in bed HEENT: proptosis, goiter with bruit Cardiac: tachycardic but regular rhythm, no rubs, murmurs or gallops Pulm: breathing well, bibasilar crackles still present but improved Ext: warm and well perfused, mild pedal edema/pre-tibial Skin: no rash, hair, or nail changes  Lab Results: Basic Metabolic Panel:  Recent Labs Lab 04/13/16 1030 04/14/16 0706  04/18/16 0233 04/19/16 0316  NA  --  138  < > 136 136  K  --  3.3*  < > 3.9 3.6  CL  --  109  < > 105 105  CO2  --  21*  < > 22 22  GLUCOSE  --  107*  < > 90 94  BUN  --  6  < > <5* <5*  CREATININE  --  0.40*  < > 0.38* 0.42*  CALCIUM  --  8.5*  < > 8.8* 8.8*  MG 1.6* 1.8  --   --   --   PHOS  --  5.2*  --   --   --   < > = values in this interval not displayed.   Medications: I have reviewed the patient's current medications. Scheduled Meds: . aspirin  81 mg Oral Daily  . diclofenac sodium  2 g Topical QID  . enoxaparin (LOVENOX) injection  40 mg Subcutaneous Q24H  . ferrous sulfate  325 mg Oral Q breakfast  . furosemide  40 mg Oral BID  . labetalol  300 mg Oral BID  . methimazole  10 mg Oral TID  . pantoprazole  40 mg Oral BID  . prenatal multivitamin  1 tablet Oral Q1200  . sodium chloride flush  3 mL Intravenous Q12H   Continuous Infusions:  PRN Meds:.benzonatate,  dextromethorphan, ondansetron (ZOFRAN) IV   Assessment/Plan:  Tara [name] is a 25 year old pregnant lady at [redacted] weeks gestation with Grave's disease here with hypoxia from pulmonary edema related to hyperthyroidism in pregnancy.  Dyspnea and hypoxia related to hyperthyroid-induced pulmonary edema from high-output heart failure in pregnancy: Her dyspnea and hypoxia continue to slowly improve with diuresis; she is still desaturating when walking and has some bibasilar rales so we'll continue with diuresis today. We'll watch her pressures closely; if she becomes hypotensive, we will switch her from labetalol to propanolol but she's fine for now.  -Thanks Pulmonology and OB/GYN for your thoughtful assistance -Change to furosemide 40 mg PO bid -Continue labetalol 300 twice daily -Increase methimazole from 5 to 10 mg three times daily -Consulted physical therapy to help walk her -Assist to chair -She will need very close outpatient follow-up with endocrinology -We will schedule her to see [place] in clinic as well -Recheck TSH 4-6 weeks.  Dispo: Disposition is deferred  at this time, awaiting improvement of current medical problems.  Anticipated discharge in approximately 1-2 day(s).   The patient does not have a current PCP (No Pcp Per Patient) and does need an [facility] hospital follow-up appointment after discharge.  The patient does not know have transportation limitations that hinder transportation to clinic appointments.  .Services Needed at time of discharge: Y = Yes, Blank = No PT:   OT:   RN:   Equipment:   Other:     LOS: 6 days   Valentino Nose, MD 04/19/2016, 11:50 AM

## 2016-04-19 NOTE — Progress Notes (Signed)
Physical Therapy Treatment Patient Details Name: Tara [name] MRN: [id number] DOB: [birth year] Today's Date: 04/19/2016    History of Present Illness Pt is a 25 y/o pregnant female admitted w/ hypoxia from pulmonary edema related to hyperthyroidism in pregnancy.  Pt's PMH includes obesity, graves disease, anemia.    PT Comments    Pt progressing towards physical therapy goals. She is currently functioning at a modified independent level with improvement shown in maintenance of O2 sats on RA during gait training. Encouraged pt to walk with nursing or family between therapy sessions to improve endurance. Will keep on PT caseload on a 1x/week basis to check in on progress. Will continue to follow.   Follow Up Recommendations  No PT follow up;Supervision - Intermittent     Equipment Recommendations  None recommended by PT    Recommendations for Other Services       Precautions / Restrictions Precautions Precaution Comments: watch O2 Restrictions Weight Bearing Restrictions: No    Mobility  Bed Mobility Overal bed mobility: Independent             General bed mobility comments: no cues or physical assist needed  Transfers Overall transfer level: Modified independent Equipment used: None Transfers: Sit to/from Stand           General transfer comment: No instability noted. Pt easily transitioned to/from EOB.   Ambulation/Gait Ambulation/Gait assistance: Modified independent (Device/Increase time) Ambulation Distance (Feet): 750 Feet Assistive device: None Gait Pattern/deviations: WFL(Within Functional Limits) Gait velocity: Pt reports she is currently ambulating at her normal pace Gait velocity interpretation: at or above normal speed for age/gender General Gait Details: DOE 1/4 while ambulating. Pt on RA throughout gait training and at rest sats 96% dropping to 90% at times during ambulation. Pt was able to maintain 92-93% with continued pursed-lip breathing.     Stairs Stairs: Yes Stairs assistance: Modified independent (Device/Increase time) Stair Management: No rails;Alternating pattern;Forwards Number of Stairs: 10 (5x2) General stair comments: No unsteadiness or LOB noted. Pt appeared comfortable and confident on the stairs.   Wheelchair Mobility    Modified Rankin (Stroke Patients Only)       Balance Overall balance assessment: Needs assistance Sitting-balance support: Feet supported;No upper extremity supported Sitting balance-Leahy Scale: Good     Standing balance support: No upper extremity supported;During functional activity Standing balance-Leahy Scale: Good                      Cognition Arousal/Alertness: Awake/alert Behavior During Therapy: WFL for tasks assessed/performed Overall Cognitive Status: Within Functional Limits for tasks assessed                      Exercises      General Comments        Pertinent Vitals/Pain Pain Assessment: No/denies pain    Home Living                      Prior Function            PT Goals (current goals can now be found in the care plan section) Acute Rehab PT Goals Patient Stated Goal: Return home PT Goal Formulation: With patient Time For Goal Achievement: 05/01/16 Potential to Achieve Goals: Good Progress towards PT goals: Progressing toward goals    Frequency  Min 1X/week    PT Plan Frequency needs to be updated    Co-evaluation  End of Session   Activity Tolerance: Patient tolerated treatment well Patient left: in bed;with call bell/phone within reach     Time: 1158-1215 PT Time Calculation (min) (ACUTE ONLY): 17 min  Charges:  $Gait Training: 8-22 mins                    G Codes:      Conni Slipper May 03, 2016, 1:24 PM   Conni Slipper, PT, DPT Acute Rehabilitation Services Pager: [phone]

## 2016-04-19 NOTE — Progress Notes (Signed)
Daily FHT doppler performed.  Spoke with Dr. Adrian Blackwater.  Patient denies OB complaints.  States she is feeling much better and expects to be discharged 04/21/16.

## 2016-04-19 NOTE — Progress Notes (Signed)
Name: Tara [name] MRN: [id number] DOB: [birth year]    ADMISSION DATE:  04/13/2016 CONSULTATION DATE:  04/15/16  REFERRING MD :  Dr. Heide Spark  CHIEF COMPLAINT:  Abnormal CT Chest    HISTORY OF PRESENT ILLNESS:  25 y/o F with PMH of obesity, hyperthyroidism / Graves Disease (dx 2012), medically non-compliant with beta blocker / methimazole, HTN, anemia, headaches, chronic bronchitis, PNA, prior cesarean section and [redacted] weeks pregnant (G3P2A0) who presented to [facility] on 4/25 with complaints of cough and shortness of breath.    She was admitted in February 2017 for PNA and hyperthyroidism symptoms.  After discharge, she reports she developed cough which has been ongoing.  The patient reports running out of thyroid medications and has not been taking for ~ 2 weeks.   The patient reported on admission that she was told she could not return to work until she was seen which prompted evaluation.  She reports cough with green sputum production.  Initial ER evaluation notable for ambulatory O2 assessment where saturations remained >97% with exertion.  She had a coughing episode and sats decreased to 93% but recovered quickly.  Initial labs - Na 135, K 2.9, Mg 1.6, Cl 105, CO2 18, sr cr 0.37, glucose 103, BNP 70.2, WBC 9.3, Hgb 10.3, MCV 67.9, and platelets 253. D-dimer assessed - 0.60 (mild elevation).  Due to elevated D-Dimer, a CTA was assessed which was moderately motion degraded, no PE identified, ground-glass opacities throughout both lungs (pna vs pulmonary edema), cardiomegaly and pulmonary arterial enlargement.    Due to abnormal CT findings, PCCM consulted for evaluation.   SUBJECTIVE: Feeling better but still DOE and some baseline. Cough is main complaint at this time.   VITAL SIGNS: Temp:  [97.7 F (36.5 C)-98.3 F (36.8 C)] 98 F (36.7 C) (05/01 0507) Pulse Rate:  [82-89] 87 (05/01 0507) Resp:  [18-20] 18 (05/01 0507) BP: (115-126)/(46-64) 115/46 mmHg (05/01 0507) SpO2:  [97 %-98 %] 98  % (05/01 0507) Weight:  [111.33 kg (245 lb 7 oz)] 111.33 kg (245 lb 7 oz) (05/01 0500)  PHYSICAL EXAMINATION: General: Obese female in NAD, calm Neuro:  AAOx4, speech clear, MAE HEENT:  MM pink/moist, got jvd Cardiovascular:  s1s2 rrr tachy mild Lungs:  Even/mildly labored, Increased WOB with somewhat fragmented sentences. Distant breath sounds.  Abdomen:  Obese/soft, bsx4 active, non-tender Musculoskeletal:  No acute deformities  Skin:  Warm/dry, no lesions or rashes, multiple tattoos, no pitting edema in LE's   Recent Labs Lab 04/17/16 0441 04/18/16 0233 04/19/16 0316  NA 138 136 136  K 3.7 3.9 3.6  CL 107 105 105  CO2 22 22 22   BUN <5* <5* <5*  CREATININE 0.40* 0.38* 0.42*  GLUCOSE 87 90 94    Recent Labs Lab 04/14/16 0706 04/15/16 0639 04/16/16 0413  HGB 8.3* 8.3* 8.7*  HCT 26.3* 25.4* 27.0*  WBC 12.7* 9.8 12.0*  PLT 228 211 255   No results found.  SIGNIFICANT EVENTS  4/25  Admit with complaints of SOB, cough  STUDIES:  4/24  CXR 2V >> interval improvement & near complete resolution of multi-focal opacities  4/25  CTA Chest >> moderately motion degraded, no PE identified, ground-glass opacities throughout both lungs (pna vs pulmonary edema), cardiomegaly and pulmonary arterial enlargement  4/28 Echo > LVEF 55-60%  CULTURES: Influenza 4/28 >> negative  ASSESSMENT / PLAN:  Bilateral Ground Glass Opacities - noted on 4/25 CTA of chest, DDx includes pulmonary edema (may be rate related due to hyperthyroidism &  hypervolemia with pregnancy), viral pneumonia, acute interstitial pneumonia (AIP), acute eosinophilic lung disease, and early hypersensitivity pneumonia.  Negative PCT goes against acute infectious etiology.  Has improved with diuresis, echo shows no component CM, pulmonary artery pressures could not be estimated.   Cough - secondary to above and significant component of anxiety, hyperthyroidism   ? Pulmonary Hypertension - noted enlarged pulmonary  vasculature on CT. Echo unable to estimate pressures, but no R heart strain identified.    Plan: Continue diuresis as tolerated Keep neg balance Doubt infectious Cough suppression If symptoms don't improve with further diuresis would consider autoimmune workup   Hyperthyroidism / Graves Disease   Plan: Medications per IMTS  Joneen Roach, AGACNP-BC [place] [facility] Pager [phone] or [phone]  04/19/2016 1:09 PM  Attending Note:  25 year old female with SOB likely due to pulmonary edema.  Echo noted and BNP of 112.  Responding to diureses.  I reviewed CT of the chest myself no PE, pulmonary edema noted.  Responding to diureses.  On exam, bibasilar crackles.  Cardiomegally on CXR as well.  I find it very difficult that her heart is completely normal.  Discussed with PCCM-NP.  Pulmonary edema:  - May want to involve cardiology.  - Lasix.  - CXR in AM.  Hypoxemia:  - Supplemental O2 for sat of 92-95%.  Cardiomegally with a normal echo:  - May want cardiology involved.  PCCM will follow.  Patient seen and examined, agree with above note.  I dictated the care and orders written for this patient under my direction.  Alyson Reedy, MD [phone]

## 2016-04-20 ENCOUNTER — Inpatient Hospital Stay (HOSPITAL_COMMUNITY): Payer: Medicaid Other

## 2016-04-20 DIAGNOSIS — R0602 Shortness of breath: Secondary | ICD-10-CM

## 2016-04-20 DIAGNOSIS — R0600 Dyspnea, unspecified: Secondary | ICD-10-CM

## 2016-04-20 LAB — BASIC METABOLIC PANEL
ANION GAP: 10 (ref 5–15)
BUN: <5 mg/dL — ABNORMAL LOW (ref 6–20)
CALCIUM: 8.6 mg/dL — AB (ref 8.9–10.3)
CHLORIDE: 103 mmol/L (ref 101–111)
CO2: 22 mmol/L (ref 22–32)
Creatinine, Ser: 0.4 mg/dL — ABNORMAL LOW (ref 0.44–1.00)
GFR calc Af Amer: >60 mL/min (ref >60)
GFR calc non Af Amer: >60 mL/min (ref >60)
GLUCOSE: 83 mg/dL (ref 65–99)
Potassium: 3.4 mmol/L — ABNORMAL LOW (ref 3.5–5.1)
Sodium: 135 mmol/L (ref 135–145)

## 2016-04-20 MED ORDER — POTASSIUM CHLORIDE CRYS ER 20 MEQ PO TBCR
40.0000 meq | EXTENDED_RELEASE_TABLET | Freq: Once | ORAL | Status: AC
  Administered 2016-04-20: 40 meq via ORAL
  Filled 2016-04-20: qty 2

## 2016-04-20 NOTE — Progress Notes (Signed)
*  PRELIMINARY RESULTS* Vascular Ultrasound Lower extremity venous duplex has been completed.  Preliminary findings: No evidence of DVT or baker's cyst.   Farrel Demark, RDMS, RVT  04/20/2016, 1:06 PM

## 2016-04-20 NOTE — Progress Notes (Signed)
Patient ID: Tara [name], female   DOB: 09-20-1991, 25 y.o.   MRN: [id number]   Subjective: Patient complaining of some shortness of breath at rest this morning. Denies any calf tenderness. Reports shortness of breath at rest began yesterday. Previously only had dyspnea with exertion.   Objective: Vital signs in last 24 hours: Filed Vitals:   04/19/16 0507 04/19/16 1300 04/19/16 2008 04/20/16 0510  BP: 115/46 120/57 143/54 140/50  Pulse: 87 82 88 87  Temp: 98 F (36.7 C) 98.2 F (36.8 C) 98.7 F (37.1 C) 98.7 F (37.1 C)  TempSrc: Oral Oral Oral Oral  Resp: 18 18 18 18   Height:      Weight:      SpO2: 98% 99% 100% 100%    General: obese woman resting in bed HEENT: proptosis, goiter with bruit Cardiac: tachycardic but regular rhythm, no rubs, murmurs or gallops Pulm: breathing well, bibasilar crackles improving Ext: warm and well perfused, mild pedal edema/pre-tibial Skin: no rash, hair, or nail changes  Lab Results: Basic Metabolic Panel:  Recent Labs Lab 04/13/16 1030 04/14/16 0706  04/19/16 0316 04/20/16 0338  NA  --  138  < > 136 135  K  --  3.3*  < > 3.6 3.4*  CL  --  109  < > 105 103  CO2  --  21*  < > 22 22  GLUCOSE  --  107*  < > 94 83  BUN  --  6  < > <5* <5*  CREATININE  --  0.40*  < > 0.42* 0.40*  CALCIUM  --  8.5*  < > 8.8* 8.6*  MG 1.6* 1.8  --   --   --   PHOS  --  5.2*  --   --   --   < > = values in this interval not displayed.   Medications: I have reviewed the patient's current medications. Scheduled Meds: . aspirin  81 mg Oral Daily  . diclofenac sodium  2 g Topical QID  . enoxaparin (LOVENOX) injection  40 mg Subcutaneous Q24H  . ferrous sulfate  325 mg Oral Q breakfast  . furosemide  40 mg Oral BID  . labetalol  300 mg Oral BID  . methimazole  10 mg Oral TID  . pantoprazole  40 mg Oral BID  . potassium chloride  40 mEq Oral Once  . prenatal multivitamin  1 tablet Oral Q1200  . sodium chloride flush  3 mL Intravenous Q12H    Continuous Infusions:  PRN Meds:.benzonatate, dextromethorphan, ondansetron (ZOFRAN) IV   Assessment/Plan:  Tara [name] is a 25 year old pregnant lady at [redacted] weeks gestation with Grave's disease here with hypoxia from pulmonary edema related to hyperthyroidism in pregnancy.  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 New dyspnea at rest started yesterday. Patient has been refusing lovenox injections and SCDs are laying in the corner this morning. Denies any leg/calf pain. O2 sats 100% on RA. Patient has risk factors for DVT/PE including pregnancy and inactivity. Denies any history of clots. BP stable.  -Thanks Pulmonology and OB/GYN for your thoughtful assistance -Continue furosemide 40 mg PO bid -Continue labetalol 300 twice daily -Continue methimazole 10 mg three times daily -Consulted physical therapy to help walk her -Assist to chair -She will need very close outpatient follow-up with endocrinology -We will schedule her to see [place] in clinic as well -Recheck TSH, T4 in 4-6  weeks. -STAT LE Dopplers -CXR  Dispo: Anticipate discharge in 1-2 days.   The patient does not have a current PCP (No Pcp Per Patient) and does need an [facility] hospital follow-up appointment after discharge.  The patient does not know have transportation limitations that hinder transportation to clinic appointments.  .Services Needed at time of discharge: Y = Yes, Blank = No PT:   OT:   RN:   Equipment:   Other:     LOS: 7 days   Valentino Nose, MD 04/20/2016, 7:37 AM

## 2016-04-20 NOTE — Progress Notes (Signed)
Received call from Dr. Karma Greaser concerning pt's VTE prophylaxis. Pt has been refusing both Lovenox and SCDs and given her SOB while sitting, the Dr. Karma Greaser is concerned she may have developed a blood clot. Confirmed with Dr. Karma Greaser that if she continues to refuse the Lovenox, she must wear the SCDs (and if she continue to refuse the SCDs to update him). Educated the pt on the importance of wearing the SCDs and while she was still apprehensive (she has to get up to the bathroom frequently and "it's a hassle to take them on and off") she stated she understood the importance agreed to put the SCDs on. SCDs placed and pt resting in bed. Will continue to monitor

## 2016-04-20 NOTE — Progress Notes (Signed)
Spoke with Dr. Shawnie Pons. Pt has no obstetrical complaints. Denies vaginal bleeding, leaking of fluid, or uc's. FHR 132 by doppler. Vascular ultrasound of lower extremites neg for DVT. Chest x-ray results not back yet.

## 2016-04-20 NOTE — Progress Notes (Signed)
Name: Tara [name] MRN: [id number] DOB: 1991-03-16    ADMISSION DATE:  04/13/2016 CONSULTATION DATE:  04/15/16  REFERRING MD :  Dr. Heide Spark  CHIEF COMPLAINT:  Abnormal CT Chest    HISTORY OF PRESENT ILLNESS:  25 y/o F with PMH of obesity, hyperthyroidism / Graves Disease (dx 2012), medically non-compliant with beta blocker / methimazole, HTN, anemia, headaches, chronic bronchitis, PNA, prior cesarean section and [redacted] weeks pregnant (G3P2A0) who presented to [facility] on 4/25 with complaints of cough and shortness of breath.    She was admitted in February 2017 for PNA and hyperthyroidism symptoms.  After discharge, she reports she developed cough which has been ongoing.  The patient reports running out of thyroid medications and has not been taking for ~ 2 weeks.   The patient reported on admission that she was told she could not return to work until she was seen which prompted evaluation.  She reports cough with green sputum production.  Initial ER evaluation notable for ambulatory O2 assessment where saturations remained >97% with exertion.  She had a coughing episode and sats decreased to 93% but recovered quickly.  Initial labs - Na 135, K 2.9, Mg 1.6, Cl 105, CO2 18, sr cr 0.37, glucose 103, BNP 70.2, WBC 9.3, Hgb 10.3, MCV 67.9, and platelets 253. D-dimer assessed - 0.60 (mild elevation).  Due to elevated D-Dimer, a CTA was assessed which was moderately motion degraded, no PE identified, ground-glass opacities throughout both lungs (pna vs pulmonary edema), cardiomegaly and pulmonary arterial enlargement.    Due to abnormal CT findings, PCCM consulted for evaluation.   SUBJECTIVE: Feels better today, no new complaints, off O2.  VITAL SIGNS: Temp:  [98.1 F (36.7 C)-98.7 F (37.1 C)] 98.1 F (36.7 C) (05/02 1300) Pulse Rate:  [78-88] 78 (05/02 1300) Resp:  [18] 18 (05/02 1300) BP: (140-143)/(50-60) 140/60 mmHg (05/02 1300) SpO2:  [96 %-100 %] 96 % (05/02 1300)  PHYSICAL  EXAMINATION: General: Obese female in NAD, calm, laying on couch next to boyfriend off O2. Neuro:  AAOx4, speech clear, MAE HEENT:  MM pink/moist, got jvd Cardiovascular:  s1s2 rrr tachy mild Lungs:  Even/mildly labored, Increased WOB with somewhat fragmented sentences. Distant breath sounds.  Abdomen:  Obese/soft, bsx4 active, non-tender Musculoskeletal:  No acute deformities  Skin:  Warm/dry, no lesions or rashes, multiple tattoos, no pitting edema in LE's   Recent Labs Lab 04/18/16 0233 04/19/16 0316 04/20/16 0338  NA 136 136 135  K 3.9 3.6 3.4*  CL 105 105 103  CO2 22 22 22   BUN <5* <5* <5*  CREATININE 0.38* 0.42* 0.40*  GLUCOSE 90 94 83    Recent Labs Lab 04/14/16 0706 04/15/16 0639 04/16/16 0413  HGB 8.3* 8.3* 8.7*  HCT 26.3* 25.4* 27.0*  WBC 12.7* 9.8 12.0*  PLT 228 211 255   Dg Chest 2 View  04/20/2016  CLINICAL DATA:  Acute onset shortness of breath today. Hypertension. EXAM: CHEST  2 VIEW COMPARISON:  04/12/2016 FINDINGS: Mild cardiomegaly is stable. Low lung volumes again noted. Mild diffuse interstitial prominence is again noted and may be due to mild interstitial edema or pneumonitis. No evidence of pulmonary consolidation or pleural effusion. IMPRESSION: Stable cardiomegaly and pulmonary interstitial prominence. Mild interstitial edema or pneumonitis cannot be excluded. No focal consolidation or pleural effusion. Electronically Signed   By: Myles Rosenthal M.D.   On: 04/20/2016 13:25    SIGNIFICANT EVENTS  4/25  Admit with complaints of SOB, cough  STUDIES:  4/24  CXR 2V >> interval improvement & near complete resolution of multi-focal opacities  4/25  CTA Chest >> moderately motion degraded, no PE identified, ground-glass opacities throughout both lungs (pna vs pulmonary edema), cardiomegaly and pulmonary arterial enlargement  4/28 Echo > LVEF 55-60%  CULTURES: Influenza 4/28 >> negative  I reviewed chest CT myself, no evidence of PE.  ASSESSMENT /  PLAN:  Bilateral Ground Glass Opacities - noted on 4/25 CTA of chest, DDx includes pulmonary edema (may be rate related due to hyperthyroidism & hypervolemia with pregnancy), viral pneumonia, acute interstitial pneumonia (AIP), acute eosinophilic lung disease, and early hypersensitivity pneumonia.  Negative PCT goes against acute infectious etiology.  Has improved with diuresis, echo shows no component CM, pulmonary artery pressures could not be estimated.   Discussed with PCCM NP.  Cough - secondary to above and significant component of anxiety, hyperthyroidism, now dry.  ? Pulmonary Hypertension - noted enlarged pulmonary vasculature on CT. Echo unable to estimate pressures, but no R heart strain identified.   Plan: Continue diuresis as tolerated Keep neg balance Doubt infectious Cough suppression May need a right heart cath if symptoms worsen.  Hyperthyroidism / Graves Disease   Plan: Medications per IMTS  PCCM will sign off, please call back if needed.  Alyson Reedy, M.D. [facility] Pulmonary/Critical Care Medicine. Pager: [phone]. After hours pager: [phone].  04/20/2016 3:11 PM

## 2016-04-20 NOTE — Progress Notes (Signed)
This RN and Patient walked around the [address] unit. Patient instructed to let RN know if she becomes SOB, Dizzy or needed to stop. Patient did not feel the need to stop and completed one lap around the unit. Patient's pulse ox range was 98% to 100% while walking around the unit.

## 2016-04-21 MED ORDER — METHIMAZOLE 10 MG PO TABS: 10.0000 mg | ORAL_TABLET | Freq: Three times a day (TID) | ORAL | Status: DC

## 2016-04-21 MED ORDER — PRENATAL MULTIVITAMIN CH: 1.0000 | ORAL_TABLET | Freq: Every day | ORAL | Status: DC

## 2016-04-21 MED ORDER — POTASSIUM CHLORIDE CRYS ER 20 MEQ PO TBCR
20.0000 meq | EXTENDED_RELEASE_TABLET | Freq: Every day | ORAL | Status: DC
  Administered 2016-04-21: 20 meq via ORAL
  Filled 2016-04-21: qty 1

## 2016-04-21 MED ORDER — LABETALOL HCL 300 MG PO TABS: 300.0000 mg | ORAL_TABLET | Freq: Two times a day (BID) | ORAL | Status: DC

## 2016-04-21 MED ORDER — POTASSIUM CHLORIDE CRYS ER 20 MEQ PO TBCR: 20.0000 meq | EXTENDED_RELEASE_TABLET | Freq: Every day | ORAL | Status: DC

## 2016-04-21 MED ORDER — FUROSEMIDE 40 MG PO TABS: 40.0000 mg | ORAL_TABLET | Freq: Two times a day (BID) | ORAL | Status: DC

## 2016-04-21 NOTE — Progress Notes (Signed)
Pt ready for discharge. All discharge instructions reviewed, rx given to pt. Encouraged to keep scheduled appts. Meds called into [facility] pharm by physician. Pt in no distress and voices no c/o discomfort. No coughing observed this am.

## 2016-04-21 NOTE — Discharge Instructions (Signed)
We are starting you on a few medications. Methemazole 10 mg three times a day, lasix 40 mg twice a day and a potassium supplement while you are on the lasix.   I have scheduled you follow up appointments in our clinic downstairs and at [facility] hospital for next week.   If you have any problems getting medications or any questions after you leave the hospital, please call our clinic at [phone].

## 2016-04-21 NOTE — Progress Notes (Signed)
1145 FHT doppler performed.  WNL.  Patient denies abdominal pain, vaginal bleeding, leaking of fluid.  Dr. Jolayne Panther called with daily update. Notified of patient up for discharge today.

## 2016-04-21 NOTE — Progress Notes (Signed)
Patient ID: Tara [name], female   DOB: 01/20/1991, 25 y.o.   MRN: [id number]   Subjective: Patient still complaining of some shortness of breath at rest this morning. Denies any calf tenderness. Says she had more difficulty/fatigue walking yesterday but maintain good O2 sats.   Objective: Vital signs in last 24 hours: Filed Vitals:   04/20/16 0510 04/20/16 1300 04/20/16 2058 04/21/16 0633  BP: 140/50 140/60 131/68 129/62  Pulse: 87 78 99 89  Temp: 98.7 F (37.1 C) 98.1 F (36.7 C) 98.4 F (36.9 C) 98.1 F (36.7 C)  TempSrc: Oral  Oral   Resp: 18 18  17   Height:      Weight:      SpO2: 100% 96% 98% 98%   General: obese woman resting in bed HEENT: proptosis, goiter with bruit Cardiac:mildly tachycardic but regular rhythm, no rubs, murmurs or gallops Pulm: breathing well, bibasilar crackles improving Ext: warm and well perfused, mild pedal edema/pre-tibial Skin: no rash, hair, or nail changes  Lab Results: Basic Metabolic Panel:  Recent Labs Lab 04/19/16 0316 04/20/16 0338  NA 136 135  K 3.6 3.4*  CL 105 103  CO2 22 22  GLUCOSE 94 83  BUN <5* <5*  CREATININE 0.42* 0.40*  CALCIUM 8.8* 8.6*     Medications: I have reviewed the patient's current medications. Scheduled Meds: . aspirin  81 mg Oral Daily  . diclofenac sodium  2 g Topical QID  . enoxaparin (LOVENOX) injection  40 mg Subcutaneous Q24H  . ferrous sulfate  325 mg Oral Q breakfast  . furosemide  40 mg Oral BID  . labetalol  300 mg Oral BID  . methimazole  10 mg Oral TID  . pantoprazole  40 mg Oral BID  . prenatal multivitamin  1 tablet Oral Q1200  . sodium chloride flush  3 mL Intravenous Q12H   Continuous Infusions:  PRN Meds:.benzonatate, dextromethorphan, ondansetron (ZOFRAN) IV   Assessment/Plan:  Tara [name] is a 25 year old pregnant lady at [redacted] weeks gestation with Grave's disease here with hypoxia from pulmonary edema related to hyperthyroidism in pregnancy.  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 New dyspnea at rest started 5/1, no change. Patient has bad refusing lovenox injections and SCDs are laying in the corner on rounds yesterday. LE dopplers yesterday negative for DVT. CXR unremarkable. Patient satting 100% on RA at rest and 98-100% while walking. VSS. Low clinical suspicion for PE at this time and do not believe it is necessary to due CTA or V/Q scan at this time.  -Thanks Pulmonology and OB/GYN for your thoughtful assistance -Continue furosemide 40 mg PO bid -Continue labetalol 300 twice daily -Continue methimazole 10 mg three times daily -Consulted physical therapy to help walk her -Assist to chair -She will need very close outpatient follow-up with endocrinology -We will schedule her to see [place] in clinic as well -Recheck TSH, T4 in 4-6 weeks  Dispo: Anticipate discharge this afternoon  The patient does not have a current PCP (No Pcp Per Patient) and does need an [facility] hospital follow-up appointment after discharge.  The patient does not know have transportation limitations that hinder transportation to clinic appointments.  .Services Needed at time of discharge: Y = Yes, Blank = No PT:   OT:   RN:   Equipment:   Other:     LOS: 8 days   Valentino Nose, MD 04/21/2016, 8:09 AM

## 2016-04-26 ENCOUNTER — Telehealth: Payer: Self-pay | Admitting: General Practice

## 2016-04-26 NOTE — Telephone Encounter (Signed)
APT. REMINDER CALL, LMTCB °

## 2016-04-27 ENCOUNTER — Ambulatory Visit (HOSPITAL_COMMUNITY)
Admission: RE | Admit: 2016-04-27 | Discharge: 2016-04-27 | Disposition: A | Discharge status: Home / Self Care | Payer: Medicaid Other | Source: Ambulatory Visit | Attending: Family | Admitting: Family

## 2016-04-27 ENCOUNTER — Encounter (HOSPITAL_COMMUNITY): Payer: Self-pay

## 2016-04-27 ENCOUNTER — Ambulatory Visit: Payer: Medicaid Other | Admitting: Internal Medicine

## 2016-04-27 VITALS — BP 106/57 | HR 102 | Wt 250.0 lb

## 2016-04-27 DIAGNOSIS — O99282 Endocrine, nutritional and metabolic diseases complicating pregnancy, second trimester: Secondary | ICD-10-CM | POA: Diagnosis not present

## 2016-04-27 DIAGNOSIS — E079 Disorder of thyroid, unspecified: Secondary | ICD-10-CM | POA: Insufficient documentation

## 2016-04-27 DIAGNOSIS — E0590 Thyrotoxicosis, unspecified without thyrotoxic crisis or storm: Secondary | ICD-10-CM

## 2016-04-27 DIAGNOSIS — O34219 Maternal care for unspecified type scar from previous cesarean delivery: Secondary | ICD-10-CM | POA: Diagnosis not present

## 2016-04-27 DIAGNOSIS — IMO0002 Reserved for concepts with insufficient information to code with codable children: Secondary | ICD-10-CM

## 2016-04-27 DIAGNOSIS — O10013 Pre-existing essential hypertension complicating pregnancy, third trimester: Secondary | ICD-10-CM | POA: Diagnosis not present

## 2016-04-27 DIAGNOSIS — O281 Abnormal biochemical finding on antenatal screening of mother: Secondary | ICD-10-CM

## 2016-04-27 DIAGNOSIS — Z3A23 23 weeks gestation of pregnancy: Secondary | ICD-10-CM | POA: Insufficient documentation

## 2016-04-27 DIAGNOSIS — O280 Abnormal hematological finding on antenatal screening of mother: Secondary | ICD-10-CM

## 2016-04-27 DIAGNOSIS — O99280 Endocrine, nutritional and metabolic diseases complicating pregnancy, unspecified trimester: Secondary | ICD-10-CM

## 2016-04-27 DIAGNOSIS — O99213 Obesity complicating pregnancy, third trimester: Secondary | ICD-10-CM | POA: Insufficient documentation

## 2016-04-27 DIAGNOSIS — O10919 Unspecified pre-existing hypertension complicating pregnancy, unspecified trimester: Secondary | ICD-10-CM

## 2016-04-29 ENCOUNTER — Encounter: Payer: Medicaid Other | Admitting: Family

## 2016-05-10 ENCOUNTER — Telehealth (HOSPITAL_COMMUNITY): Payer: Self-pay | Admitting: MS"

## 2016-05-10 NOTE — Telephone Encounter (Signed)
Called Tara [name] to discuss her prenatal cell free DNA test results.  [name] had [facility] testing through [place] [facility].  Testing was offered because of abnormal maternal serum screen results.   The patient was identified by name and DOB.  We reviewed that these are within normal limits, showing a less than 1 in 10,000 risk for trisomies 21, 18 and 13, and monosomy X (Turner syndrome).  In addition, the risk for triploidy/vanishing twin and sex chromosome trisomies (47,XXX and 47,XXY) was also low risk.  We reviewed that this testing identifies > 99% of pregnancies with trisomy 70, trisomy 34, sex chromosome trisomies (47,XXX and 47,XXY), and triploidy. The detection rate for trisomy 18 is 96%.  The detection rate for monosomy X is ~92%.  The false positive rate is <0.1% for all conditions. Testing was also consistent with female fetal sex. She understands that this testing does not identify all genetic conditions.  All questions were answered to her satisfaction, she was encouraged to call with additional questions or concerns.  Quinn Plowman, MS Certified Genetic Counselor 05/10/2016 11:01 AM

## 2016-05-11 ENCOUNTER — Other Ambulatory Visit (HOSPITAL_COMMUNITY): Payer: Self-pay

## 2016-05-23 ENCOUNTER — Encounter (HOSPITAL_COMMUNITY): Payer: Self-pay

## 2016-05-23 ENCOUNTER — Emergency Department (HOSPITAL_COMMUNITY)
Admission: EM | Admit: 2016-05-23 | Discharge: 2016-05-23 | Disposition: A | Discharge status: Home / Self Care | Payer: Medicaid Other | Attending: Emergency Medicine | Admitting: Emergency Medicine

## 2016-05-23 DIAGNOSIS — Z3A26 26 weeks gestation of pregnancy: Secondary | ICD-10-CM | POA: Diagnosis not present

## 2016-05-23 DIAGNOSIS — Z7982 Long term (current) use of aspirin: Secondary | ICD-10-CM | POA: Diagnosis not present

## 2016-05-23 DIAGNOSIS — O23592 Infection of other part of genital tract in pregnancy, second trimester: Secondary | ICD-10-CM | POA: Diagnosis not present

## 2016-05-23 DIAGNOSIS — O10912 Unspecified pre-existing hypertension complicating pregnancy, second trimester: Secondary | ICD-10-CM | POA: Diagnosis not present

## 2016-05-23 DIAGNOSIS — Z3490 Encounter for supervision of normal pregnancy, unspecified, unspecified trimester: Secondary | ICD-10-CM

## 2016-05-23 DIAGNOSIS — O26892 Other specified pregnancy related conditions, second trimester: Secondary | ICD-10-CM | POA: Diagnosis present

## 2016-05-23 DIAGNOSIS — B9689 Other specified bacterial agents as the cause of diseases classified elsewhere: Secondary | ICD-10-CM

## 2016-05-23 DIAGNOSIS — N760 Acute vaginitis: Secondary | ICD-10-CM

## 2016-05-23 LAB — URINALYSIS, ROUTINE W REFLEX MICROSCOPIC
Bilirubin Urine: NEGATIVE
Glucose, UA: NEGATIVE mg/dL
Ketones, ur: 15 mg/dL — AB
LEUKOCYTES UA: NEGATIVE
Nitrite: NEGATIVE
PROTEIN: 100 mg/dL — AB
SPECIFIC GRAVITY, URINE: 1.025 (ref 1.005–1.030)
pH: 6 (ref 5.0–8.0)

## 2016-05-23 LAB — OB RESULTS CONSOLE GC/CHLAMYDIA: Gonorrhea: NEGATIVE

## 2016-05-23 LAB — WET PREP, GENITAL
Sperm: NONE SEEN
TRICH WET PREP: NONE SEEN
YEAST WET PREP: NONE SEEN

## 2016-05-23 LAB — URINE MICROSCOPIC-ADD ON

## 2016-05-23 MED ORDER — METRONIDAZOLE 500 MG PO TABS: 500.0000 mg | ORAL_TABLET | Freq: Two times a day (BID) | ORAL | Status: DC

## 2016-05-23 MED ORDER — METRONIDAZOLE 500 MG PO TABS
500.0000 mg | ORAL_TABLET | Freq: Once | ORAL | Status: AC
  Administered 2016-05-23: 500 mg via ORAL
  Filled 2016-05-23: qty 1

## 2016-05-23 NOTE — ED Notes (Signed)
Pt c/o burning with urination.  Denies any discharge or foul smelling odor

## 2016-05-23 NOTE — Discharge Instructions (Signed)
Ms. LAENA LICK,  Nice meeting you! Please follow-up with your obstetrician regarding the protein in your urine. Return to the emergency department if you develop fevers, chills, nausea/vomiting, increased pain, new/worsening symptoms. Feel better soon!  S. Lane Hacker, PA-C  Bacterial Vaginosis Bacterial vaginosis is a vaginal infection that occurs when the normal balance of bacteria in the vagina is disrupted. It results from an overgrowth of certain bacteria. This is the most common vaginal infection in women of childbearing age. Treatment is important to prevent complications, especially in pregnant women, as it can cause a premature delivery. CAUSES  Bacterial vaginosis is caused by an increase in harmful bacteria that are normally present in smaller amounts in the vagina. Several different kinds of bacteria can cause bacterial vaginosis. However, the reason that the condition develops is not fully understood. RISK FACTORS Certain activities or behaviors can put you at an increased risk of developing bacterial vaginosis, including:  Having a new sex partner or multiple sex partners.  Douching.  Using an intrauterine device (IUD) for contraception. Women do not get bacterial vaginosis from toilet seats, bedding, swimming pools, or contact with objects around them. SIGNS AND SYMPTOMS  Some women with bacterial vaginosis have no signs or symptoms. Common symptoms include:  Grey vaginal discharge.  A fishlike odor with discharge, especially after sexual intercourse.  Itching or burning of the vagina and vulva.  Burning or pain with urination. DIAGNOSIS  Your health care provider will take a medical history and examine the vagina for signs of bacterial vaginosis. A sample of vaginal fluid may be taken. Your health care provider will look at this sample under a microscope to check for bacteria and abnormal cells. A vaginal pH test may also be done.  TREATMENT  Bacterial vaginosis  may be treated with antibiotic medicines. These may be given in the form of a pill or a vaginal cream. A second round of antibiotics may be prescribed if the condition comes back after treatment. Because bacterial vaginosis increases your risk for sexually transmitted diseases, getting treated can help reduce your risk for chlamydia, gonorrhea, HIV, and herpes. HOME CARE INSTRUCTIONS   Only take over-the-counter or prescription medicines as directed by your health care provider.  If antibiotic medicine was prescribed, take it as directed. Make sure you finish it even if you start to feel better.  Tell all sexual partners that you have a vaginal infection. They should see their health care provider and be treated if they have problems, such as a mild rash or itching.  During treatment, it is important that you follow these instructions:  Avoid sexual activity or use condoms correctly.  Do not douche.  Avoid alcohol as directed by your health care provider.  Avoid breastfeeding as directed by your health care provider. SEEK MEDICAL CARE IF:   Your symptoms are not improving after 3 days of treatment.  You have increased discharge or pain.  You have a fever. MAKE SURE YOU:   Understand these instructions.  Will watch your condition.  Will get help right away if you are not doing well or get worse. FOR MORE INFORMATION  Centers for Disease Control and Prevention, Division of STD Prevention: SolutionApps.co.za American Sexual Health Association (ASHA): www.ashastd.org    This information is not intended to replace advice given to you by your health care provider. Make sure you discuss any questions you have with your health care provider.   Document Released: 12/06/2005 Document Revised: 12/27/2014 Document Reviewed: 07/18/2013 Elsevier  Interactive Patient Education ©2016 Elsevier Inc. ° °

## 2016-05-23 NOTE — ED Notes (Signed)
Patient complains of dysuria x 1 day, [redacted] weeks pregnant and no other complaints or associated symptoms.

## 2016-05-24 LAB — GC/CHLAMYDIA PROBE AMP (~~LOC~~) NOT AT ARMC
Chlamydia: NEGATIVE
Neisseria Gonorrhea: NEGATIVE

## 2016-05-25 ENCOUNTER — Ambulatory Visit (HOSPITAL_COMMUNITY)
Admission: RE | Admit: 2016-05-25 | Discharge: 2016-05-25 | Disposition: A | Discharge status: Home / Self Care | Payer: Medicaid Other | Source: Ambulatory Visit | Attending: Family | Admitting: Family

## 2016-05-25 ENCOUNTER — Encounter (HOSPITAL_COMMUNITY): Payer: Self-pay

## 2016-05-25 DIAGNOSIS — Z3A27 27 weeks gestation of pregnancy: Secondary | ICD-10-CM | POA: Insufficient documentation

## 2016-05-25 DIAGNOSIS — O99280 Endocrine, nutritional and metabolic diseases complicating pregnancy, unspecified trimester: Secondary | ICD-10-CM

## 2016-05-25 DIAGNOSIS — O99212 Obesity complicating pregnancy, second trimester: Secondary | ICD-10-CM | POA: Diagnosis present

## 2016-05-25 DIAGNOSIS — E0590 Thyrotoxicosis, unspecified without thyrotoxic crisis or storm: Secondary | ICD-10-CM

## 2016-05-25 DIAGNOSIS — O10012 Pre-existing essential hypertension complicating pregnancy, second trimester: Secondary | ICD-10-CM | POA: Diagnosis not present

## 2016-05-25 DIAGNOSIS — O34219 Maternal care for unspecified type scar from previous cesarean delivery: Secondary | ICD-10-CM | POA: Diagnosis not present

## 2016-05-25 DIAGNOSIS — O99282 Endocrine, nutritional and metabolic diseases complicating pregnancy, second trimester: Secondary | ICD-10-CM | POA: Insufficient documentation

## 2016-05-25 DIAGNOSIS — E079 Disorder of thyroid, unspecified: Secondary | ICD-10-CM | POA: Insufficient documentation

## 2016-05-25 DIAGNOSIS — O280 Abnormal hematological finding on antenatal screening of mother: Secondary | ICD-10-CM

## 2016-05-25 DIAGNOSIS — O10919 Unspecified pre-existing hypertension complicating pregnancy, unspecified trimester: Secondary | ICD-10-CM

## 2016-05-25 DIAGNOSIS — IMO0002 Reserved for concepts with insufficient information to code with codable children: Secondary | ICD-10-CM

## 2016-05-25 DIAGNOSIS — O281 Abnormal biochemical finding on antenatal screening of mother: Secondary | ICD-10-CM | POA: Insufficient documentation

## 2016-05-25 LAB — T4, FREE: Free T4: 2.25 ng/dL — ABNORMAL HIGH (ref 0.61–1.12)

## 2016-05-25 LAB — TSH: TSH: 0.037 u[IU]/mL — ABNORMAL LOW (ref 0.350–4.500)

## 2016-05-26 ENCOUNTER — Ambulatory Visit (HOSPITAL_COMMUNITY): Payer: Medicaid Other

## 2016-05-27 NOTE — ED Notes (Signed)
Lab results reviewed by Dr. Sherrie George . She has spoken with patient about results already.

## 2016-06-02 NOTE — ED Provider Notes (Signed)
CSN: [id number]     Arrival date & time 05/23/16  1952 History   First MD Initiated Contact with Patient 05/23/16 2025     Chief Complaint  Patient presents with  . Dysuria   HPI   Tara [name] is a [redacted] week pregnant, 25 y.o. female PMH significant for HTN, Graves, obesity presenting with a 1 day history of dysuria. She denies any other symptoms, specifically fevers, chills, abdominal pain, nausea/vomiting, vaginal bleeding/discharge/odors.   Past Medical History  Diagnosis Date  . Hypertension   . Obesity   . CAP (community acquired pneumonia) 02/13/2016  . Pneumonia "several times"  . Chronic bronchitis (HCC)   . Graves disease   . Graves disease   . Headache   . Anemia    Past Surgical History  Procedure Laterality Date  . Eye surgery Bilateral 2014    "for Graves Disease; eyelid retraction on left; decompression on the right""  . Cesarean section     No family history on file. Social History  Substance Use Topics  . Smoking status: Never Smoker   . Smokeless tobacco: Never Used  . Alcohol Use: No   OB History    Gravida Para Term Preterm AB TAB SAB Ectopic Multiple Living   5 4 4  0 0 0 0 0 0 4     Review of Systems  Ten systems are reviewed and are negative for acute change except as noted in the HPI  Allergies  Iodine and Shellfish allergy  Home Medications   Prior to Admission medications   Medication Sig Start Date End Date Taking? Authorizing Provider  aspirin 81 MG chewable tablet Chew 1 tablet (81 mg total) by mouth daily. Patient not taking: Reported on 03/09/2016 02/26/16   Marlis Edelson, CNM  benzonatate (TESSALON PERLES) 100 MG capsule Take 2 capsules (200 mg total) by mouth 3 (three) times daily as needed for cough. Patient not taking: Reported on 03/09/2016 02/26/16   Marlis Edelson, CNM  ferrous sulfate (FERROUSUL) 325 (65 FE) MG tablet Take 1 tablet (325 mg total) by mouth 2 (two) times daily. Patient not taking: Reported on 03/30/2016 03/01/16    Marlis Edelson, CNM  furosemide (LASIX) 40 MG tablet Take 1 tablet (40 mg total) by mouth 2 (two) times daily. 04/21/16   Valentino Nose, MD  labetalol (NORMODYNE) 300 MG tablet Take 1 tablet (300 mg total) by mouth 2 (two) times daily. 04/21/16   Valentino Nose, MD  methimazole (TAPAZOLE) 10 MG tablet Take 1 tablet (10 mg total) by mouth 3 (three) times daily. 04/21/16   Valentino Nose, MD  metroNIDAZOLE (FLAGYL) 500 MG tablet Take 1 tablet (500 mg total) by mouth 2 (two) times daily. 05/23/16   Melton Krebs, PA-C  potassium chloride SA (K-DUR,KLOR-CON) 20 MEQ tablet Take 1 tablet (20 mEq total) by mouth daily. 04/21/16   Valentino Nose, MD  Prenatal Vit-Fe Fumarate-FA (PRENATAL MULTIVITAMIN) TABS tablet Take 1 tablet by mouth daily at 12 noon. 04/21/16   Valentino Nose, MD   BP 113/62 mmHg  Pulse 94  Temp(Src) 97.9 F (36.6 C) (Oral)  Resp 18  SpO2 99%  LMP 11/03/2015 (LMP Unknown) Physical Exam  Constitutional: She is oriented to person, place, and time. She appears well-developed and well-nourished. No distress.  Obese  HENT:  Head: Normocephalic and atraumatic.  Mouth/Throat: Oropharynx is clear and moist. No oropharyngeal exudate.  Eyes: Conjunctivae are normal. Pupils are equal, round, and reactive to light. Right eye exhibits  no discharge. Left eye exhibits no discharge. No scleral icterus.  Neck: No tracheal deviation present.  Cardiovascular: Normal rate, regular rhythm, normal heart sounds and intact distal pulses.  Exam reveals no gallop and no friction rub.   No murmur heard. Pulmonary/Chest: Effort normal and breath sounds normal. No respiratory distress. She has no wheezes. She has no rales. She exhibits no tenderness.  Abdominal: Soft. Bowel sounds are normal. She exhibits no distension. There is no tenderness. There is no rebound and no guarding.  No CVA tenderness  Genitourinary:  Uterus consistent with [redacted] week gestation. Chaperoned pelvic exam: normal external genitalia,  vulva, vagina, cervix (closed), uterus and adnexa.   Musculoskeletal: She exhibits no edema.  Lymphadenopathy:    She has no cervical adenopathy.  Neurological: She is alert and oriented to person, place, and time. Coordination normal.  Skin: Skin is warm and dry. No rash noted. She is not diaphoretic. No erythema.  Psychiatric: She has a normal mood and affect. Her behavior is normal.  Nursing note and vitals reviewed.   ED Course  Procedures  Labs Review Labs Reviewed  WET PREP, GENITAL - Abnormal; Notable for the following:    Clue Cells Wet Prep HPF POC PRESENT (*)    WBC, Wet Prep HPF POC MANY (*)    All other components within normal limits  URINALYSIS, ROUTINE W REFLEX MICROSCOPIC (NOT AT [facility]) - Abnormal; Notable for the following:    Color, Urine AMBER (*)    Hgb urine dipstick TRACE (*)    Ketones, ur 15 (*)    Protein, ur 100 (*)    All other components within normal limits  URINE MICROSCOPIC-ADD ON - Abnormal; Notable for the following:    Squamous Epithelial / LPF 0-5 (*)    Bacteria, UA RARE (*)    All other components within normal limits  GC/CHLAMYDIA PROBE AMP ([place]) NOT AT [facility]   MDM   Final diagnoses:  Bacterial vaginosis  Pregnancy   Patient nontoxic appearing, VSS.  UA without infection. Proteinuria and trace hgb but this is baseline for patient. Encouraged OB follow-up. Pelvic and abdominal exam unremarkable.  Bacterial vaginosis on wet prep. Although there were many WBCs on wet prep, will hold off on treating for STI until GC/Chlamydia results are back. Patient to be discharged with instructions to follow up with OBGYN. Discussed importance of using protection when sexually active. Pt understands that they have GC/Chlamydia cultures pending and that they will need to inform all sexual partners if results return positive. Pt not concerning for PID because hemodynamically stable and no cervical motion tenderness on pelvic exam. Pt has also been  treated with flagyl for Bacterial Vaginosis. Pt has been advised to not drink alcohol while on this medication. Patient may be safely discharged home. Discussed reasons for return. Patient to follow-up with OB within one week. Patient in understanding and agreement with the plan.  Melton Krebs, PA-C 06/03/16 [id number]  Gerhard Munch, MD 06/03/16 [phone]

## 2016-06-23 ENCOUNTER — Ambulatory Visit (HOSPITAL_COMMUNITY): Admission: RE | Admit: 2016-06-23 | Payer: Medicaid Other | Source: Ambulatory Visit

## 2016-07-02 ENCOUNTER — Inpatient Hospital Stay (HOSPITAL_COMMUNITY)
Admission: AD | Admit: 2016-07-02 | Discharge: 2016-07-03 | Disposition: A | Discharge status: Home / Self Care | Payer: Medicaid Other | Source: Ambulatory Visit | Attending: Obstetrics & Gynecology | Admitting: Obstetrics & Gynecology

## 2016-07-02 DIAGNOSIS — Z3A00 Weeks of gestation of pregnancy not specified: Secondary | ICD-10-CM | POA: Diagnosis not present

## 2016-07-02 DIAGNOSIS — O1203 Gestational edema, third trimester: Secondary | ICD-10-CM | POA: Insufficient documentation

## 2016-07-02 NOTE — MAU Note (Signed)
Was hospitalized this preg in June with fld around lungs. Having some shortness of breath with walking and legs really swollen for 2 days. Denies LOF or bleeding

## 2016-07-03 ENCOUNTER — Encounter (HOSPITAL_COMMUNITY): Payer: Self-pay | Admitting: *Deleted

## 2016-07-03 DIAGNOSIS — O1203 Gestational edema, third trimester: Secondary | ICD-10-CM

## 2016-07-03 LAB — URINALYSIS, ROUTINE W REFLEX MICROSCOPIC
Glucose, UA: NEGATIVE mg/dL
Hgb urine dipstick: NEGATIVE
KETONES UR: NEGATIVE mg/dL
LEUKOCYTES UA: NEGATIVE
NITRITE: NEGATIVE
PROTEIN: 30 mg/dL — AB
Specific Gravity, Urine: 1.025 (ref 1.005–1.030)
pH: 6 (ref 5.0–8.0)

## 2016-07-03 LAB — URINE MICROSCOPIC-ADD ON
RBC / HPF: NONE SEEN RBC/hpf (ref 0–5)
WBC, UA: NONE SEEN WBC/hpf (ref 0–5)

## 2016-07-03 NOTE — Discharge Instructions (Signed)

## 2016-07-07 ENCOUNTER — Encounter (HOSPITAL_COMMUNITY): Payer: Self-pay

## 2016-07-07 ENCOUNTER — Ambulatory Visit (HOSPITAL_COMMUNITY)
Admission: RE | Admit: 2016-07-07 | Discharge: 2016-07-07 | Disposition: A | Discharge status: Home / Self Care | Payer: Medicaid Other | Source: Ambulatory Visit | Attending: Maternal and Fetal Medicine | Admitting: Maternal and Fetal Medicine

## 2016-07-07 ENCOUNTER — Other Ambulatory Visit (HOSPITAL_COMMUNITY): Payer: Self-pay | Admitting: Maternal and Fetal Medicine

## 2016-07-07 ENCOUNTER — Encounter (HOSPITAL_COMMUNITY): Payer: Self-pay | Admitting: *Deleted

## 2016-07-07 ENCOUNTER — Inpatient Hospital Stay (HOSPITAL_COMMUNITY)
Admission: AD | Admit: 2016-07-07 | Discharge: 2016-07-14 | DRG: 781 | Disposition: A | Discharge status: Home / Self Care | Payer: Medicaid Other | Source: Ambulatory Visit | Attending: Obstetrics & Gynecology | Admitting: Obstetrics & Gynecology

## 2016-07-07 DIAGNOSIS — O10013 Pre-existing essential hypertension complicating pregnancy, third trimester: Secondary | ICD-10-CM | POA: Diagnosis present

## 2016-07-07 DIAGNOSIS — O365990 Maternal care for other known or suspected poor fetal growth, unspecified trimester, not applicable or unspecified: Secondary | ICD-10-CM

## 2016-07-07 DIAGNOSIS — O34219 Maternal care for unspecified type scar from previous cesarean delivery: Secondary | ICD-10-CM

## 2016-07-07 DIAGNOSIS — O10919 Unspecified pre-existing hypertension complicating pregnancy, unspecified trimester: Secondary | ICD-10-CM

## 2016-07-07 DIAGNOSIS — O99283 Endocrine, nutritional and metabolic diseases complicating pregnancy, third trimester: Secondary | ICD-10-CM | POA: Diagnosis present

## 2016-07-07 DIAGNOSIS — Z3A27 27 weeks gestation of pregnancy: Secondary | ICD-10-CM

## 2016-07-07 DIAGNOSIS — O10913 Unspecified pre-existing hypertension complicating pregnancy, third trimester: Secondary | ICD-10-CM

## 2016-07-07 DIAGNOSIS — Z6841 Body Mass Index (BMI) 40.0 and over, adult: Secondary | ICD-10-CM

## 2016-07-07 DIAGNOSIS — O365930 Maternal care for other known or suspected poor fetal growth, third trimester, not applicable or unspecified: Secondary | ICD-10-CM

## 2016-07-07 DIAGNOSIS — O99213 Obesity complicating pregnancy, third trimester: Secondary | ICD-10-CM | POA: Diagnosis present

## 2016-07-07 DIAGNOSIS — E0590 Thyrotoxicosis, unspecified without thyrotoxic crisis or storm: Secondary | ICD-10-CM

## 2016-07-07 DIAGNOSIS — O10912 Unspecified pre-existing hypertension complicating pregnancy, second trimester: Secondary | ICD-10-CM | POA: Diagnosis present

## 2016-07-07 DIAGNOSIS — Z3A33 33 weeks gestation of pregnancy: Secondary | ICD-10-CM

## 2016-07-07 DIAGNOSIS — O99280 Endocrine, nutritional and metabolic diseases complicating pregnancy, unspecified trimester: Secondary | ICD-10-CM | POA: Diagnosis present

## 2016-07-07 DIAGNOSIS — E079 Disorder of thyroid, unspecified: Secondary | ICD-10-CM | POA: Insufficient documentation

## 2016-07-07 DIAGNOSIS — O281 Abnormal biochemical finding on antenatal screening of mother: Secondary | ICD-10-CM

## 2016-07-07 DIAGNOSIS — IMO0002 Reserved for concepts with insufficient information to code with codable children: Secondary | ICD-10-CM

## 2016-07-07 LAB — COMPREHENSIVE METABOLIC PANEL
ALBUMIN: 3 g/dL — AB (ref 3.5–5.0)
ALK PHOS: 101 U/L (ref 38–126)
ALT: 8 U/L — AB (ref 14–54)
AST: 13 U/L — ABNORMAL LOW (ref 15–41)
Anion gap: 7 (ref 5–15)
BUN: 5 mg/dL — ABNORMAL LOW (ref 6–20)
CALCIUM: 8.9 mg/dL (ref 8.9–10.3)
CO2: 22 mmol/L (ref 22–32)
CREATININE: 0.45 mg/dL (ref 0.44–1.00)
Chloride: 106 mmol/L (ref 101–111)
GFR calc Af Amer: >60 mL/min (ref >60)
GFR calc non Af Amer: >60 mL/min (ref >60)
GLUCOSE: 75 mg/dL (ref 65–99)
Potassium: 4 mmol/L (ref 3.5–5.1)
SODIUM: 135 mmol/L (ref 135–145)
Total Bilirubin: 0.5 mg/dL (ref 0.3–1.2)
Total Protein: 7.2 g/dL (ref 6.5–8.1)

## 2016-07-07 LAB — T4, FREE: Free T4: 2.1 ng/dL — ABNORMAL HIGH (ref 0.61–1.12)

## 2016-07-07 LAB — URINE MICROSCOPIC-ADD ON: WBC, UA: NONE SEEN WBC/hpf (ref 0–5)

## 2016-07-07 LAB — CBC
HCT: 34.3 % — ABNORMAL LOW (ref 36.0–46.0)
Hemoglobin: 11.3 g/dL — ABNORMAL LOW (ref 12.0–15.0)
MCH: 22.9 pg — AB (ref 26.0–34.0)
MCHC: 32.9 g/dL (ref 30.0–36.0)
MCV: 69.4 fL — AB (ref 78.0–100.0)
PLATELETS: 323 10*3/uL (ref 150–400)
RBC: 4.94 MIL/uL (ref 3.87–5.11)
RDW: 15.4 % (ref 11.5–15.5)
WBC: 9.1 10*3/uL (ref 4.0–10.5)

## 2016-07-07 LAB — URINALYSIS, ROUTINE W REFLEX MICROSCOPIC
BILIRUBIN URINE: NEGATIVE
GLUCOSE, UA: NEGATIVE mg/dL
KETONES UR: NEGATIVE mg/dL
Leukocytes, UA: NEGATIVE
Nitrite: NEGATIVE
PH: 5.5 (ref 5.0–8.0)
Protein, ur: NEGATIVE mg/dL
Specific Gravity, Urine: <1.005 — ABNORMAL LOW (ref 1.005–1.030)

## 2016-07-07 LAB — PROTEIN / CREATININE RATIO, URINE
Creatinine, Urine: 26 mg/dL
Protein Creatinine Ratio: 0.81 mg/mg{Cre} — ABNORMAL HIGH (ref 0.00–0.15)
Total Protein, Urine: 21 mg/dL

## 2016-07-07 LAB — TYPE AND SCREEN
ABO/RH(D): O POS
Antibody Screen: NEGATIVE

## 2016-07-07 LAB — ABO/RH: ABO/RH(D): O POS

## 2016-07-07 LAB — TSH: TSH: 0.085 u[IU]/mL — ABNORMAL LOW (ref 0.350–4.500)

## 2016-07-07 MED ORDER — DOCUSATE SODIUM 100 MG PO CAPS
100.0000 mg | ORAL_CAPSULE | Freq: Every day | ORAL | Status: DC
  Administered 2016-07-07 – 2016-07-14 (×8): 100 mg via ORAL
  Filled 2016-07-07 (×8): qty 1

## 2016-07-07 MED ORDER — CALCIUM CARBONATE ANTACID 500 MG PO CHEW
2.0000 | CHEWABLE_TABLET | ORAL | Status: DC | PRN
  Administered 2016-07-09: 400 mg via ORAL
  Filled 2016-07-07: qty 2

## 2016-07-07 MED ORDER — LABETALOL HCL 300 MG PO TABS
300.0000 mg | ORAL_TABLET | Freq: Two times a day (BID) | ORAL | Status: DC
  Administered 2016-07-07 – 2016-07-14 (×14): 300 mg via ORAL
  Filled 2016-07-07 (×14): qty 1

## 2016-07-07 MED ORDER — ASPIRIN 81 MG PO CHEW
81.0000 mg | CHEWABLE_TABLET | Freq: Every day | ORAL | Status: DC
  Administered 2016-07-07 – 2016-07-14 (×8): 81 mg via ORAL
  Filled 2016-07-07 (×8): qty 1

## 2016-07-07 MED ORDER — METHIMAZOLE 10 MG PO TABS
10.0000 mg | ORAL_TABLET | Freq: Two times a day (BID) | ORAL | Status: DC
  Administered 2016-07-07 – 2016-07-09 (×4): 10 mg via ORAL
  Filled 2016-07-07 (×4): qty 1

## 2016-07-07 MED ORDER — BETAMETHASONE SOD PHOS & ACET 6 (3-3) MG/ML IJ SUSP
12.0000 mg | Freq: Once | INTRAMUSCULAR | Status: DC
Start: 2016-07-07 — End: 2016-07-07
  Filled 2016-07-07: qty 2

## 2016-07-07 MED ORDER — BETAMETHASONE SOD PHOS & ACET 6 (3-3) MG/ML IJ SUSP: 12.0000 mg | Freq: Once | INTRAMUSCULAR | Status: DC

## 2016-07-07 MED ORDER — PRENATAL MULTIVITAMIN CH
1.0000 | ORAL_TABLET | Freq: Every day | ORAL | Status: DC
  Administered 2016-07-07 – 2016-07-13 (×6): 1 via ORAL
  Filled 2016-07-07 (×7): qty 1

## 2016-07-07 MED ORDER — ZOLPIDEM TARTRATE 5 MG PO TABS: 5.0000 mg | ORAL_TABLET | Freq: Every evening | ORAL | Status: DC | PRN

## 2016-07-07 MED ORDER — ACETAMINOPHEN 325 MG PO TABS
650.0000 mg | ORAL_TABLET | ORAL | Status: DC | PRN
  Administered 2016-07-08 – 2016-07-10 (×3): 650 mg via ORAL
  Filled 2016-07-07 (×3): qty 2

## 2016-07-07 MED ORDER — FERROUS SULFATE 325 (65 FE) MG PO TABS
325.0000 mg | ORAL_TABLET | Freq: Every day | ORAL | Status: DC
  Administered 2016-07-08 – 2016-07-14 (×7): 325 mg via ORAL
  Filled 2016-07-07 (×7): qty 1

## 2016-07-07 MED ORDER — BETAMETHASONE SOD PHOS & ACET 6 (3-3) MG/ML IJ SUSP
12.0000 mg | INTRAMUSCULAR | Status: AC
  Administered 2016-07-07 – 2016-07-08 (×2): 12 mg via INTRAMUSCULAR
  Filled 2016-07-07 (×2): qty 2

## 2016-07-07 NOTE — H&P (Signed)
Faculty Practice Antenatal History and Physical  Tara [name] TDH:[id number] DOB: [birth year] DOA: 07/07/2016  Chief Complaint: abnormal fetal US  HPI: Tara [name] is a 25 y.o. female [phone] with IUP at [redacted]w[redacted]d presenting for fetal growth restriction and abnormal cord dopplers today, to receive betamethasone injections and repeat dopplers in 2 days. She declined outpatient management stating to Dr Marjo Bicker that she would not f/u as an outpatient.   Review of Systems:    Pt denies any contractions.  Review of systems are otherwise negative  Prenatal History/Complications: Patient Active Problem List   Diagnosis Date Noted  . IUGR, antenatal 07/07/2016  . Hypoxemia   . Tachycardia   . Acute on chronic systolic congestive heart failure (HCC)   . HCAP (healthcare-associated pneumonia) 04/13/2016  . Hyperthyroidism in pregnancy, antepartum 04/13/2016  . Dyspnea 04/13/2016  . Hyperthyroidism   . Noncompliance   . Pregnant   . Acute pulmonary edema (HCC)   . Abnormal maternal serum screening test 03/30/2016  . Anemia of mother in pregnancy, antepartum 03/01/2016  . Supervision of high risk pregnancy, antepartum 02/26/2016  . Twin pregnancy with fetal loss and retention of one fetus, antepartum 02/26/2016  . Chronic hypertension during pregnancy, antepartum 02/26/2016  . Previous cesarean delivery affecting pregnancy, antepartum 02/26/2016  . Pregnancy   . Graves disease 02/11/2016  . Hypertension 02/11/2016  . Acute respiratory failure with hypoxia (HCC) 02/11/2016  . Morbid obesity due to excess calories [facility])      Past Medical History: Past Medical History  Diagnosis Date  . Hypertension   . Obesity   . CAP (community acquired pneumonia) 02/13/2016  . Pneumonia "several times"  . Chronic bronchitis (HCC)   . Graves disease   . Graves disease   . Headache   . Anemia     Past Surgical History: Past Surgical History  Procedure Laterality Date  . Eye surgery  Bilateral 2014    "for Graves Disease; eyelid retraction on left; decompression on the right""  . Cesarean section      Obstetrical History: OB History    Gravida Para Term Preterm AB TAB SAB Ectopic Multiple Living   5 4 4  0 0 0 0 0 0 4      Gynecological History: Pertinent Gynecological History: Menses: pregnant Last pap: normal Date: 02/26/16   Social History: Social History   Social History  . Marital Status: Single    Spouse Name: N/A  . Number of Children: N/A  . Years of Education: N/A   Social History Main Topics  . Smoking status: Never Smoker   . Smokeless tobacco: Never Used  . Alcohol Use: No  . Drug Use: No  . Sexual Activity: Not Currently   Other Topics Concern  . Not on file   Social History Narrative    Family History: No family history on file.  Allergies: Allergies  Allergen Reactions  . Iodine Hives  . Shellfish Allergy     Prescriptions prior to admission  Medication Sig Dispense Refill Last Dose  . aspirin 81 MG chewable tablet Chew 1 tablet (81 mg total) by mouth daily. (Patient not taking: Reported on 03/09/2016) 30 tablet 2 Not Taking  . ferrous sulfate (FERROUSUL) 325 (65 FE) MG tablet Take 1 tablet (325 mg total) by mouth 2 (two) times daily. (Patient not taking: Reported on 03/30/2016) 60 tablet 1 Not Taking  . labetalol (NORMODYNE) 300 MG tablet Take 1 tablet (300 mg total) by mouth 2 (two) times daily. 60  tablet 0 Taking  . methimazole (TAPAZOLE) 10 MG tablet Take 1 tablet (10 mg total) by mouth 3 (three) times daily. 90 tablet 0 Taking  . Prenatal Vit-Fe Fumarate-FA (PRENATAL MULTIVITAMIN) TABS tablet Take 1 tablet by mouth daily at 12 noon. 30 tablet 0 Taking    Physical Exam: LMP 11/03/2015 (LMP Unknown)  BP 134/70 mmHg  Pulse 87  Temp(Src) 98.2 F (36.8 C) (Oral)  Resp 16  Ht  (1.676 m)  Wt 117.119 kg (258 lb 3.2 oz)  BMI 41.69 kg/m2  LMP 11/03/2015 (LMP Unknown) cephalic    Fetal Heart Rate A      Mode   External filed at 07/07/2016 1200    Baseline Rate (A)  135 bpm filed at 07/07/2016 1200    Variability  6-25 BPM filed at 07/07/2016 1200    Accelerations  15 x 15 filed at 07/07/2016 1200    Decelerations  None filed at 07/07/2016 1200       Chest nl effort Abdomen gravid Extr no sign DVT Pelvic deferred            Labs on Admission:  Basic Metabolic Panel: No results for input(s): NA, K, CL, CO2, GLUCOSE, BUN, CREATININE, CALCIUM, MG, PHOS in the last 168 hours. Liver Function Tests: No results for input(s): AST, ALT, ALKPHOS, BILITOT, PROT, ALBUMIN in the last 168 hours. No results for input(s): LIPASE, AMYLASE in the last 168 hours. No results for input(s): AMMONIA in the last 168 hours. CBC: No results for input(s): WBC, NEUTROABS, HGB, HCT, MCV, PLT in the last 168 hours.  CBG: No results for input(s): GLUCAP in the last 168 hours.  Radiological Exams on Admission: No results found.   Assessment/Plan Present on Admission:  . Chronic hypertension during pregnancy, antepartum  IUGR and abnormal cord dopplers Code Status: Full   Disposition Plan: Admission for observation, betamethasone, repeat dopplers in 2 days  Time spent: 30 min  Adam Phenix, MD 07/07/2016 9:44 AM Faculty Practice Attending Physician [facility] Attending Phone #: [phone]  **Disclaimer: This note may have been dictated with voice recognition software. Similar sounding words can inadvertently be transcribed and this note may contain transcription errors which may not have been corrected upon publication of note.**

## 2016-07-07 NOTE — ED Notes (Signed)
Report called to Shanda Bumps, Charity fundraiser.  Pt to be admitted to Antenatal, awaiting room assignment.

## 2016-07-08 ENCOUNTER — Other Ambulatory Visit (HOSPITAL_COMMUNITY): Payer: Medicaid Other

## 2016-07-08 LAB — T3, FREE: T3, Free: 8.1 pg/mL — ABNORMAL HIGH (ref 2.0–4.4)

## 2016-07-08 NOTE — Progress Notes (Signed)
Pt reports "doesn't feel like contractions".

## 2016-07-08 NOTE — Progress Notes (Signed)
Patient ID: Tara [name], female   DOB: 09-10-1991, 25 y.o.   MRN: [id number] FACULTY PRACTICE ANTEPARTUM(COMPREHENSIVE) NOTE  Tara [name] is a 25 y.o. [id number] with Estimated Date of Delivery: 08/24/16   By  midtrimester ultrasound [redacted]w[redacted]d  who is admitted for inpatient fetal surveillance and administration of betamethasone.    Fetal presentation is cephalic. Length of Stay:  1  Days  Date of admission:07/07/2016  Subjective:  Patient reports the fetal movement as active. Patient reports uterine contraction  activity as none. Patient reports  vaginal bleeding as none. Patient describes fluid per vagina as None.  Vitals:  Blood pressure 148/64, pulse 86, temperature 98.3 F (36.8 C), temperature source Oral, resp. rate 20, height 5\' 6"  (1.676 m), weight 258 lb 3.2 oz (117.119 kg), last menstrual period 11/03/2015. Filed Vitals:   07/07/16 1008 07/07/16 2123 07/08/16 0808 07/08/16 1205  BP:  142/62 147/63 148/64  Pulse:  102 95 86  Temp:  97.5 F (36.4 C) 97.7 F (36.5 C) 98.3 F (36.8 C)  TempSrc:  Oral Oral Oral  Resp:  20 20 20   Height: 5\' 6"  (1.676 m)     Weight: 258 lb 3.2 oz (117.119 kg)      Physical Examination:  General appearance - alert, well appearing, and in no distress Abdomen - benign Fundal Height:  size equals dates Extremities: extremities normal, atraumatic, no cyanosis or edema with DTRs 2+ bilaterally Membranes:intact  Fetal Monitoring:  Baseline: 135 bpm, Variability: Good {> 6 bpm), Accelerations: Reactive and Decelerations: Absent   reactive  Labs:  No results found for this or any previous visit (from the past 24 hour(s)).  Imaging Studies:     Currently EPIC will not allow sonographic studies to automatically populate into notes.  In the meantime, copy and paste results into note or free text.  Medications:  Scheduled . aspirin  81 mg Oral Daily  . docusate sodium  100 mg Oral Daily  . ferrous sulfate  325 mg Oral Q breakfast  . labetalol   300 mg Oral BID  . methimazole  10 mg Oral BID  . prenatal multivitamin  1 tablet Oral Q1200   I have reviewed the patient's current medications.  ASSESSMENT: [id number] [redacted]w[redacted]d Estimated Date of Delivery: 08/24/16  IUGR with absent end diastolic flow Patient Active Problem List   Diagnosis Date Noted  . IUGR, antenatal 07/07/2016  . Hypoxemia   . Tachycardia   . Acute on chronic systolic congestive heart failure (HCC)   . HCAP (healthcare-associated pneumonia) 04/13/2016  . Hyperthyroidism in pregnancy, antepartum 04/13/2016  . Dyspnea 04/13/2016  . Hyperthyroidism   . Noncompliance   . Pregnant   . Acute pulmonary edema (HCC)   . Abnormal maternal serum screening test 03/30/2016  . Anemia of mother in pregnancy, antepartum 03/01/2016  . Supervision of high risk pregnancy, antepartum 02/26/2016  . Twin pregnancy with fetal loss and retention of one fetus, antepartum 02/26/2016  . Chronic hypertension during pregnancy, antepartum 02/26/2016  . Previous cesarean delivery affecting pregnancy, antepartum 02/26/2016  . Pregnancy   . Graves disease 02/11/2016  . Hypertension 02/11/2016  . Acute respiratory failure with hypoxia (HCC) 02/11/2016  . Morbid obesity due to excess calories (HCC)     PLAN: Repeat Doppler studies tomorrow, continuous monitoring Pt aware delivery could be anytime based on fetal surveillance status S/P betamethasone series  Tara [name],LUTHER H 07/08/2016,3:51 PM

## 2016-07-09 ENCOUNTER — Inpatient Hospital Stay (HOSPITAL_COMMUNITY): Payer: Medicaid Other

## 2016-07-09 DIAGNOSIS — Z3A33 33 weeks gestation of pregnancy: Secondary | ICD-10-CM

## 2016-07-09 DIAGNOSIS — O365930 Maternal care for other known or suspected poor fetal growth, third trimester, not applicable or unspecified: Principal | ICD-10-CM

## 2016-07-09 MED ORDER — GI COCKTAIL ~~LOC~~
30.0000 mL | Freq: Once | ORAL | Status: AC
  Administered 2016-07-09: 30 mL via ORAL
  Filled 2016-07-09: qty 30

## 2016-07-09 MED ORDER — METHIMAZOLE 5 MG PO TABS
15.0000 mg | ORAL_TABLET | Freq: Two times a day (BID) | ORAL | Status: DC
  Administered 2016-07-09 – 2016-07-14 (×10): 15 mg via ORAL
  Filled 2016-07-09 (×10): qty 1
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
## 2016-07-09 NOTE — Progress Notes (Addendum)
Patient ID: Tara [name], female   DOB: 12-19-1991, 25 y.o.   MRN: [id number] FACULTY PRACTICE ANTEPARTUM(COMPREHENSIVE) NOTE  Tara [name] is a 25 y.o. [id number] at [redacted]w[redacted]d who is admitted for inpatient fetal surveillance secondary to fetal growth restriction and abnormal umbilical cord dopplers.  Fetal presentation is cephalic. Length of Stay:  2  Days  Date of admission:07/07/2016  Subjective: No complaints Patient reports the fetal movement as active. Patient reports uterine contraction  activity as none. Patient reports  vaginal bleeding as none. Patient describes fluid per vagina as None.  Vitals:  Blood pressure 126/54, pulse 83, temperature 97.9 F (36.6 C), temperature source Oral, resp. rate 18, height 5\' 6"  (1.676 m), weight 258 lb 3.2 oz (117.119 kg), last menstrual period 11/03/2015. Filed Vitals:   07/08/16 2335 07/09/16 0415 07/09/16 0833 07/09/16 1207  BP: 141/56 151/48 142/74 126/54  Pulse: 96 90 81 83  Temp: 98.1 F (36.7 C) 97.6 F (36.4 C) 98.2 F (36.8 C) 97.9 F (36.6 C)  TempSrc: Oral Oral Oral Oral  Resp: 18 18 20 18   Height:      Weight:       Physical Examination: General appearance - alert, well appearing, and in no distress Abdomen - benign Fundal Height:  size equals dates Extremities: extremities normal, atraumatic, no cyanosis or edema with DTRs 2+ bilaterally Membranes:intact  Fetal Monitoring:  Baseline: 135 bpm, Variability: Moderate {> 6 bpm), Accelerations: Reactive and Decelerations: Absent   reactive  Labs:  No results found for this or any previous visit (from the past 24 hour(s)).  Imaging Studies:    07/09/2016  Preliminary read by Dr. Clarisa Fling  BPP 6/8 ( - for tone), elevated UA dopplers. Less intermittent AEDF (likely steroid effect)  Medications:  Scheduled . aspirin  81 mg Oral Daily  . docusate sodium  100 mg Oral Daily  . ferrous sulfate  325 mg Oral Q breakfast  . labetalol  300 mg Oral BID  . methimazole  15 mg Oral BID   . prenatal multivitamin  1 tablet Oral Q1200   I have reviewed the patient's current medications.  ASSESSMENT: [id number] [redacted]w[redacted]d Estimated Date of Delivery: 08/24/16  IUGR with absent end diastolic flow Patient Active Problem List   Diagnosis Date Noted  . IUGR, antenatal 07/07/2016  . Hypoxemia   . Tachycardia   . Acute on chronic systolic congestive heart failure (HCC)   . HCAP (healthcare-associated pneumonia) 04/13/2016  . Hyperthyroidism in pregnancy, antepartum 04/13/2016  . Dyspnea 04/13/2016  . Hyperthyroidism   . Noncompliance   . Pregnant   . Acute pulmonary edema (HCC)   . Abnormal maternal serum screening test 03/30/2016  . Anemia of mother in pregnancy, antepartum 03/01/2016  . Supervision of high risk pregnancy, antepartum 02/26/2016  . Twin pregnancy with fetal loss and retention of one fetus, antepartum 02/26/2016  . Chronic hypertension during pregnancy, antepartum 02/26/2016  . Previous cesarean delivery affecting pregnancy, antepartum 02/26/2016  . Pregnancy   . Graves disease 02/11/2016  . Hypertension 02/11/2016  . Acute respiratory failure with hypoxia (HCC) 02/11/2016  . Morbid obesity due to excess calories (HCC)     PLAN: Repeat Doppler studies 07/12/16, continue NST tid for now Pt aware delivery could be anytime based on fetal surveillance status S/P betamethasone series Continue inpatient antenatal care  [facility] A, MD 07/09/2016,12:15 PM

## 2016-07-09 NOTE — Progress Notes (Signed)
Pt c/o chest pain, describes pain as intermittent pinching sensation.  NAD noted, color normal, no SOB or dyspnea observed.  Pt states she thinks it's d/t to her hyperthyroidism.

## 2016-07-10 MED ORDER — BUTALBITAL-APAP-CAFFEINE 50-325-40 MG PO TABS
1.0000 | ORAL_TABLET | Freq: Four times a day (QID) | ORAL | Status: DC | PRN
  Administered 2016-07-10 – 2016-07-11 (×2): 1 via ORAL
  Filled 2016-07-10 (×2): qty 1

## 2016-07-10 NOTE — Progress Notes (Signed)
Patient ID: Tara [name], female   DOB: 07-20-1991, 25 y.o.   MRN: [id number] FACULTY PRACTICE ANTEPARTUM(COMPREHENSIVE) NOTE  Tara [name] is a 25 y.o. [id number] at [redacted]w[redacted]d  who is admitted for IUGR, AEDF.  As well as hyperthyroidism, with Methimazole dose increased yesterday Fetal presentation is cephalic. Length of Stay:  3  Days  Subjective: Last night patient had some chest discomfort which was treated with GI cocktail with apparent resolution. She still is showing appearance of hyperthyroidism Patient reports the fetal movement as active. Patient reports uterine contraction  activity as none. Patient reports  vaginal bleeding as none. Patient describes fluid per vagina as None.  Vitals:  Blood pressure 158/72, pulse 83, temperature 98.4 F (36.9 C), temperature source Oral, resp. rate 18, height 5\' 6"  (1.676 m), weight 117.119 kg (258 lb 3.2 oz), last menstrual period 11/03/2015, SpO2 96 %. Physical Examination:  General appearance - alert, well appearing, and in no distress, anxious and active Heart - normal rate and regular rhythm, increased respiratory rate Abdomen - soft, nontender, nondistended Fundal Height:  size equals dates Cervical Exam: Not evaluated. and found to be not evaluated/ / and fetal presentation is cephalic. By recent ultrasound Extremities: extremities normal, atraumatic, no cyanosis or edema and Homans sign is negative, no sign of DVT with DTRs 2+ bilaterally Membranes:intact  Fetal Monitoring:  Daily monitoring pending at this time  Labs:  No results found for this or any previous visit (from the past 24 hour(s)).  Imaging Studies:     Currently EPIC will not allow sonographic studies to automatically populate into notes.  In the meantime, copy and paste results into note or free text.  Medications:  Scheduled . aspirin  81 mg Oral Daily  . docusate sodium  100 mg Oral Daily  . ferrous sulfate  325 mg Oral Q breakfast  . labetalol  300 mg Oral BID   . methimazole  15 mg Oral BID  . prenatal multivitamin  1 tablet Oral Q1200   I have reviewed the patient's current medications.  ASSESSMENT: Patient Active Problem List   Diagnosis Date Noted  . IUGR, antenatal 07/07/2016  . Hypoxemia   . Tachycardia   . Acute on chronic systolic congestive heart failure (HCC)   . HCAP (healthcare-associated pneumonia) 04/13/2016  . Hyperthyroidism in pregnancy, antepartum 04/13/2016  . Dyspnea 04/13/2016  . Hyperthyroidism   . Noncompliance   . Pregnant   . Acute pulmonary edema (HCC)   . Abnormal maternal serum screening test 03/30/2016  . Anemia of mother in pregnancy, antepartum 03/01/2016  . Supervision of high risk pregnancy, antepartum 02/26/2016  . Twin pregnancy with fetal loss and retention of one fetus, antepartum 02/26/2016  . Chronic hypertension during pregnancy, antepartum 02/26/2016  . Previous cesarean delivery affecting pregnancy, antepartum 02/26/2016  . Pregnancy   . Graves disease 02/11/2016  . Hypertension 02/11/2016  . Acute respiratory failure with hypoxia (HCC) 02/11/2016  . Morbid obesity due to excess calories [facility])     PLAN: Continue inpatient care, with Dopplers and BPP scheduled 07/12/2016 at 33 weeks 6 days Anticipate delivery plan be made after Mondays BPP and Dopplers Zakiyah Diop V 07/10/2016,9:50 AM

## 2016-07-11 DIAGNOSIS — O99283 Endocrine, nutritional and metabolic diseases complicating pregnancy, third trimester: Secondary | ICD-10-CM

## 2016-07-11 DIAGNOSIS — E0590 Thyrotoxicosis, unspecified without thyrotoxic crisis or storm: Secondary | ICD-10-CM

## 2016-07-11 NOTE — Progress Notes (Signed)
Patient ID: [name], female   DOB: 03-Jun-1991, 25 y.o.   MRN: [id number] FACULTY PRACTICE ANTEPARTUM(COMPREHENSIVE) NOTE  Tara [name] is a 25 y.o. [id number] at [redacted]w[redacted]d  who is admitted for IUGR, AEDF.  As well as hyperthyroidism, with Methimazole dose increased yesterday Fetal presentation is cephalic. Length of Stay:  4  Days  Subjective: No complaints this morning.  HR improved, less anxious in appearance.   Patient reports the fetal movement as active. Patient reports uterine contraction  activity as none. Patient reports  vaginal bleeding as none. Patient describes fluid per vagina as None.  Vitals:  Blood pressure 130/65, pulse 95, temperature 98.1 F (36.7 C), temperature source Oral, resp. rate 16, height 5\' 6"  (1.676 m), weight 258 lb 3.2 oz (117.1 kg), last menstrual period 11/03/2015, SpO2 96 %. Physical Examination:  General appearance - alert, well appearing, and in no distress, anxious and active Heart - normal rate and regular rhythm, increased respiratory rate Abdomen - soft, nontender, nondistended Fundal Height:  size equals dates Cervical Exam: Not evaluated. and found to be not evaluated/ / and fetal presentation is cephalic. By recent ultrasound Extremities: extremities normal, atraumatic, no cyanosis or edema and Homans sign is negative, no sign of DVT with DTRs 2+ bilaterally Membranes:intact  Fetal Monitoring:  135, mod variability, accel, no decel.  Labs:  No results found for this or any previous visit (from the past 24 hour(s)).  Imaging Studies:     Currently EPIC will not allow sonographic studies to automatically populate into notes.  In the meantime, copy and paste results into note or free text.  Medications:  Scheduled . aspirin  81 mg Oral Daily  . docusate sodium  100 mg Oral Daily  . ferrous sulfate  325 mg Oral Q breakfast  . labetalol  300 mg Oral BID  . methimazole  15 mg Oral BID  . prenatal multivitamin  1 tablet Oral Q1200   I  have reviewed the patient's current medications.  ASSESSMENT: Patient Active Problem List   Diagnosis Date Noted  . IUGR, antenatal 07/07/2016  . Hypoxemia   . Tachycardia   . Acute on chronic systolic congestive heart failure (HCC)   . HCAP (healthcare-associated pneumonia) 04/13/2016  . Hyperthyroidism in pregnancy, antepartum 04/13/2016  . Dyspnea 04/13/2016  . Hyperthyroidism   . Noncompliance   . Pregnant   . Acute pulmonary edema (HCC)   . Abnormal maternal serum screening test 03/30/2016  . Anemia of mother in pregnancy, antepartum 03/01/2016  . Supervision of high risk pregnancy, antepartum 02/26/2016  . Twin pregnancy with fetal loss and retention of one fetus, antepartum 02/26/2016  . Chronic hypertension during pregnancy, antepartum 02/26/2016  . Previous cesarean delivery affecting pregnancy, antepartum 02/26/2016  . Pregnancy   . Graves disease 02/11/2016  . Hypertension 02/11/2016  . Acute respiratory failure with hypoxia (HCC) 02/11/2016  . Morbid obesity due to excess calories (HCC)     PLAN: 1.  IUGR 2.  Hyperthyroidism 3.  Chronic HTN 4.  33 week pregnancy  Continue NST twice daily.  BP controlled at this time - continue Labetalol 300mg  BID  Continue methimazole - was increased to 15mg  BID  Dopplers and BPP scheduled for 07/12/2016 at 33 weeks 6 days  Dispo pending [place]  Levie Heritage, DO  07/11/2016,7:11 AM

## 2016-07-12 ENCOUNTER — Other Ambulatory Visit (HOSPITAL_COMMUNITY): Payer: Medicaid Other

## 2016-07-12 ENCOUNTER — Inpatient Hospital Stay (HOSPITAL_COMMUNITY): Payer: Medicaid Other

## 2016-07-12 DIAGNOSIS — O10912 Unspecified pre-existing hypertension complicating pregnancy, second trimester: Secondary | ICD-10-CM

## 2016-07-12 NOTE — Progress Notes (Signed)
Patient ID: Tara [name], female   DOB: 05/18/1991, 25 y.o.   MRN: [id number] FACULTY PRACTICE ANTEPARTUM(COMPREHENSIVE) NOTE  THASHA HOLLERN is a 25 y.o. P1P2162 at [redacted]w[redacted]d by best clinical estimate who is admitted for IUGR with intermittent AEDF, hypertension, Hyperthyroid..   Fetal presentation is cephalic. Length of Stay:  5  Days  Subjective: She is concerned today about the plan, testing and her need to take a job. Of note, this patient has not been seen in our office or by an OB/GYN since she was an inpatient at [facility] back in May. Patient reports the fetal movement as active. Patient reports uterine contraction  activity as none. Patient reports  vaginal bleeding as none. Patient describes fluid per vagina as None.  Vitals:  Blood pressure (!) 119/40, pulse 85, temperature 98.2 F (36.8 C), temperature source Oral, resp. rate 18, height 5\' 6"  (1.676 m), weight 258 lb 3.2 oz (117.1 kg), last menstrual period 11/03/2015, SpO2 96 %. Physical Examination:  General appearance - alert, well appearing, and in no distress Chest - normal effort Abdomen - gravid, NT Fundal Height:  size equals dates Extremities: extremities normal, atraumatic, no cyanosis or edema  Membranes:intact  Fetal Monitoring:  Baseline: 145 bpm, Variability: Good {> 6 bpm), Accelerations: Reactive and Decelerations: Absent  Labs:  No results found for this or any previous visit (from the past 24 hour(s)).  Imaging Studies:     Medications:  Scheduled . aspirin  81 mg Oral Daily  . docusate sodium  100 mg Oral Daily  . ferrous sulfate  325 mg Oral Q breakfast  . labetalol  300 mg Oral BID  . methimazole  15 mg Oral BID  . prenatal multivitamin  1 tablet Oral Q1200   I have reviewed the patient's current medications.  ASSESSMENT: Active Problems:   Chronic hypertension during pregnancy, antepartum   IUGR, antenatal   PLAN: Testing is reassuring today. For f/u in 48 hours, with determination of plan  at that point.  Reva Bores, MD 07/12/2016,3:13 PM

## 2016-07-13 DIAGNOSIS — O10912 Unspecified pre-existing hypertension complicating pregnancy, second trimester: Secondary | ICD-10-CM | POA: Diagnosis not present

## 2016-07-13 LAB — DIFFERENTIAL
Basophils Absolute: 0 10*3/uL (ref 0.0–0.1)
Basophils Relative: 0 %
EOS ABS: 0.1 10*3/uL (ref 0.0–0.7)
EOS PCT: 1 %
LYMPHS ABS: 3.1 10*3/uL (ref 0.7–4.0)
LYMPHS PCT: 20 %
MONO ABS: 0.7 10*3/uL (ref 0.1–1.0)
MONOS PCT: 4 %
Neutro Abs: 11.7 10*3/uL — ABNORMAL HIGH (ref 1.7–7.7)
Neutrophils Relative %: 75 %

## 2016-07-13 LAB — CBC
HEMATOCRIT: 36.6 % (ref 36.0–46.0)
HEMOGLOBIN: 11.9 g/dL — AB (ref 12.0–15.0)
MCH: 22.9 pg — AB (ref 26.0–34.0)
MCHC: 32.5 g/dL (ref 30.0–36.0)
MCV: 70.5 fL — AB (ref 78.0–100.0)
Platelets: 351 10*3/uL (ref 150–400)
RBC: 5.19 MIL/uL — AB (ref 3.87–5.11)
RDW: 15.8 % — ABNORMAL HIGH (ref 11.5–15.5)
WBC: 15.6 10*3/uL — ABNORMAL HIGH (ref 4.0–10.5)

## 2016-07-13 NOTE — Progress Notes (Signed)
Patient ID: Tara [name], female   DOB: 04-19-1991, 25 y.o.   MRN: [id number] FACULTY PRACTICE ANTEPARTUM(COMPREHENSIVE) NOTE  Tara [name] is a 25 y.o. [id number] at [redacted]w[redacted]d by best clinical estimate who is admitted for IUGR and abnormal dopplers.   Fetal presentation is cephalic. Length of Stay:  6  Days  Subjective: Feeling well Patient reports the fetal movement as active. Patient reports uterine contraction  activity as none. Patient reports  vaginal bleeding as none. Patient describes fluid per vagina as None.  Vitals:  Blood pressure (!) 144/70, pulse 89, temperature 97.8 F (36.6 C), temperature source Oral, resp. rate 18, height 5\' 6"  (1.676 m), weight 258 lb 3.2 oz (117.1 kg), last menstrual period 11/03/2015, SpO2 96 %. Physical Examination:  General appearance - alert, well appearing, and in no distress Chest - normal effort Abdomen - gravid, NT Fundal Height:  size equals dates Extremities: extremities normal, atraumatic, no cyanosis or edema  Membranes:intact  Fetal Monitoring:  Baseline: 145 bpm, Variability: Good {> 6 bpm), Accelerations: Reactive and Decelerations: Absent   Medications:  Scheduled . aspirin  81 mg Oral Daily  . docusate sodium  100 mg Oral Daily  . ferrous sulfate  325 mg Oral Q breakfast  . labetalol  300 mg Oral BID  . methimazole  15 mg Oral BID  . prenatal multivitamin  1 tablet Oral Q1200   I have reviewed the patient's current medications.  ASSESSMENT: Principal Problem:   IUGR, antenatal Active Problems:   Chronic hypertension during pregnancy, antepartum   Hyperthyroidism in pregnancy, antepartum   PLAN: Repeat Dopplers in am Plan based on that Inpatient for now Continue Mathimazole Continue Labetalol  Xue Low S, MD 07/13/2016,7:56 AM
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
## 2016-07-14 ENCOUNTER — Encounter (HOSPITAL_COMMUNITY): Payer: Self-pay | Admitting: *Deleted

## 2016-07-14 ENCOUNTER — Inpatient Hospital Stay (HOSPITAL_COMMUNITY): Payer: Medicaid Other

## 2016-07-14 ENCOUNTER — Encounter (HOSPITAL_COMMUNITY): Payer: Self-pay | Admitting: Family Medicine

## 2016-07-14 LAB — RPR: RPR: NONREACTIVE

## 2016-07-14 LAB — HEPATITIS B SURFACE ANTIGEN: Hepatitis B Surface Ag: NEGATIVE

## 2016-07-14 LAB — HEMOGLOBIN A1C
Hgb A1c MFr Bld: 4.9 % (ref 4.8–5.6)
MEAN PLASMA GLUCOSE: 94 mg/dL

## 2016-07-14 LAB — HIV ANTIBODY (ROUTINE TESTING W REFLEX): HIV Screen 4th Generation wRfx: NONREACTIVE

## 2016-07-14 LAB — RUBELLA SCREEN: Rubella: 1.65 index (ref >0.99)

## 2016-07-14 MED ORDER — LACTATED RINGERS IV SOLN: INTRAVENOUS | Status: DC

## 2016-07-14 MED ORDER — METHIMAZOLE 5 MG PO TABS: 15.0000 mg | ORAL_TABLET | Freq: Two times a day (BID) | ORAL | 2 refills | Status: DC

## 2016-07-14 NOTE — Progress Notes (Signed)
Pt. Discharged home via wheelchair. Pt. Voiced no questions or concerns about her follow up care or appointments.

## 2016-07-14 NOTE — Progress Notes (Signed)
FACULTY PRACTICE ANTEPARTUM(COMPREHENSIVE) NOTE  Tara [name] is a 25 y.o. [id number] at [redacted]w[redacted]d by best clinical estimate who is admitted for IUGR and abnormal cord dopplers.   Fetal presentation is cephalic. Length of Stay:  7  Days  Subjective: Wishes she could be discharged Patient reports the fetal movement as active. Patient reports uterine contraction  activity as none. Patient reports  vaginal bleeding as none. Patient describes fluid per vagina as None.  Vitals:  Blood pressure 122/60, pulse 93, temperature 98 F (36.7 C), temperature source Oral, resp. rate 17, height 5\' 6"  (1.676 m), weight 117.1 kg (258 lb 3.2 oz), last menstrual period 11/03/2015, SpO2 96 %. Physical Examination:  General appearance - alert, well appearing, and in no distress Heart - normal rate and regular rhythm Abdomen - soft, nontender, nondistended Fundal Height:  size equals dates Extremities: extremities normal, atraumatic, no cyanosis or edema Membranes:intact  Fetal Monitoring:   Fetal Heart Rate A  Mode External filed at 07/13/2016 2300  Baseline Rate (A) 145 bpm filed at 07/13/2016 2300  Variability 6-25 BPM filed at 07/13/2016 2300  Accelerations 15 x 15 filed at 07/13/2016 2300  Decelerations None filed at 07/13/2016 2300     Labs:  Results for orders placed or performed during the hospital encounter of 07/07/16 (from the past 24 hour(s))  Hepatitis B surface antigen   Collection Time: 07/13/16  9:05 AM  Result Value Ref Range   Hepatitis B Surface Ag Negative Negative  RPR   Collection Time: 07/13/16  9:05 AM  Result Value Ref Range   RPR Ser Ql Non Reactive Non Reactive  CBC   Collection Time: 07/13/16  9:05 AM  Result Value Ref Range   WBC 15.6 (H) 4.0 - 10.5 K/uL   RBC 5.19 (H) 3.87 - 5.11 MIL/uL   Hemoglobin 11.9 (L) 12.0 - 15.0 g/dL   HCT 41.4 23.9 - 53.2 %   MCV 70.5 (L) 78.0 - 100.0 fL   MCH 22.9 (L) 26.0 - 34.0 pg   MCHC 32.5 30.0 - 36.0 g/dL   RDW 02.3 (H) 34.3 -  15.5 %   Platelets 351 150 - 400 K/uL  Differential   Collection Time: 07/13/16  9:05 AM  Result Value Ref Range   Neutrophils Relative % 75 %   Neutro Abs 11.7 (H) 1.7 - 7.7 K/uL   Lymphocytes Relative 20 %   Lymphs Abs 3.1 0.7 - 4.0 K/uL   Monocytes Relative 4 %   Monocytes Absolute 0.7 0.1 - 1.0 K/uL   Eosinophils Relative 1 %   Eosinophils Absolute 0.1 0.0 - 0.7 K/uL   Basophils Relative 0 %   Basophils Absolute 0.0 0.0 - 0.1 K/uL  HIV antibody (routine testing)   Collection Time: 07/13/16  9:05 AM  Result Value Ref Range   HIV Screen 4th Generation wRfx Non Reactive Non Reactive  Hemoglobin A1c   Collection Time: 07/13/16  9:05 AM  Result Value Ref Range   Hgb A1c MFr Bld 4.9 4.8 - 5.6 %   Mean Plasma Glucose 94 mg/dL     Medications:  Scheduled . aspirin  81 mg Oral Daily  . docusate sodium  100 mg Oral Daily  . ferrous sulfate  325 mg Oral Q breakfast  . labetalol  300 mg Oral BID  . methimazole  15 mg Oral BID  . prenatal multivitamin  1 tablet Oral Q1200   I have reviewed the patient's current medications.  ASSESSMENT: Patient Active Problem List  Diagnosis Date Noted  . IUGR, antenatal 07/07/2016  . Tachycardia   . HCAP (healthcare-associated pneumonia) 04/13/2016  . Hyperthyroidism in pregnancy, antepartum 04/13/2016  . Noncompliance   . Abnormal maternal serum screening test 03/30/2016  . Anemia of mother in pregnancy, antepartum 03/01/2016  . Supervision of high risk pregnancy, antepartum 02/26/2016  . Twin pregnancy with fetal loss and retention of one fetus, antepartum 02/26/2016  . Chronic hypertension during pregnancy, antepartum 02/26/2016  . Previous cesarean delivery affecting pregnancy, antepartum 02/26/2016  . Graves disease 02/11/2016  . Hypertension 02/11/2016  . Acute respiratory failure with hypoxia (HCC) 02/11/2016  . Morbid obesity due to excess calories (HCC)     PLAN: Dopplers ordered today, f/u on  result  Tara [name] 07/14/2016,7:39 AM

## 2016-07-14 NOTE — Discharge Summary (Signed)
Discharge Summary   Admit Date: 07/07/2016 Discharge Date: 07/14/2016 Discharging Service: Antepartum  Primary OBGYN: [facility] Admitting Physician: Adam Phenix, MD  Discharge Physician: Cornelia Copa MD  Primary Care Provider:  [facility]  Admission Diagnoses: *Intrauterine pregnancy @ 33/1 weeks *FGR with abnormal umbilical artery dopplers *Chronic Hypertension *BMI 42 *Thyroid disorder *History of cesarean section x 3 *Patient non compliance  Discharge Diagnoses: *Same *Intrauterine pregnancy @ 34/1 weeks *FGR with normal umbilical artery dopplers  Consult Orders: Social Work  Surgeries/Procedures Performed: None  History and Physical: Faculty Practice Antenatal History and Physical  Tara [name] QQP:[id number] DOB: January 26, 1991 DOA: 07/07/2016  Chief Complaint: abnormal fetal US  HPI: Tara [name] is a 25 y.o. female [phone] with IUP at [redacted]w[redacted]d presenting for fetal growth restriction and abnormal cord dopplers today, to receive betamethasone injections and repeat dopplers in 2 days. She declined outpatient management stating to Dr Marjo Bicker that she would not f/u as an outpatient.   Review of Systems:    Pt denies any contractions.  Review of systems are otherwise negative  Prenatal History/Complications:     Patient Active Problem List   Diagnosis Date Noted  . IUGR, antenatal 07/07/2016  . Hypoxemia   . Tachycardia   . Acute on chronic systolic congestive heart failure (HCC)   . HCAP (healthcare-associated pneumonia) 04/13/2016  . Hyperthyroidism in pregnancy, antepartum 04/13/2016  . Dyspnea 04/13/2016  . Hyperthyroidism   . Noncompliance   . Pregnant   . Acute pulmonary edema (HCC)   . Abnormal maternal serum screening test 03/30/2016  . Anemia of mother in pregnancy, antepartum 03/01/2016  . Supervision of high risk pregnancy, antepartum 02/26/2016  . Twin pregnancy with  fetal loss and retention of one fetus, antepartum 02/26/2016  . Chronic hypertension during pregnancy, antepartum 02/26/2016  . Previous cesarean delivery affecting pregnancy, antepartum 02/26/2016  . Pregnancy   . Graves disease 02/11/2016  . Hypertension 02/11/2016  . Acute respiratory failure with hypoxia (HCC) 02/11/2016  . Morbid obesity due to excess calories [facility])      Past Medical History:     Past Medical History  Diagnosis Date  . Hypertension   . Obesity   . CAP (community acquired pneumonia) 02/13/2016  . Pneumonia "several times"  . Chronic bronchitis (HCC)   . Graves disease   . Graves disease   . Headache   . Anemia     Past Surgical History:       Past Surgical History  Procedure Laterality Date  . Eye surgery Bilateral 2014    "for Graves Disease; eyelid retraction on left; decompression on the right""  . Cesarean section      Obstetrical History: OB History    Gravida Para Term Preterm AB TAB SAB Ectopic Multiple Living   5 4 4  0 0 0 0 0 0 4      Gynecological History: Pertinent Gynecological History: Menses: pregnant Last pap: normal Date: 02/26/16   Social History: Social History        Social History  . Marital Status: Single    Spouse Name: N/A  . Number of Children: N/A  . Years of Education: N/A       Social History Main Topics  . Smoking status: Never Smoker   . Smokeless tobacco: Never Used  . Alcohol Use: No  . Drug Use: No  . Sexual Activity: Not Currently       Other Topics Concern  .  Not on file   Social History Narrative    Family History: No family history on file.  Allergies:     Allergies  Allergen Reactions  . Iodine Hives  . Shellfish Allergy            Prescriptions prior to admission  Medication Sig Dispense Refill Last Dose  . aspirin 81 MG chewable tablet Chew 1 tablet (81 mg total) by mouth daily. (Patient not taking: Reported on 03/09/2016) 30 tablet 2  Not Taking  . ferrous sulfate (FERROUSUL) 325 (65 FE) MG tablet Take 1 tablet (325 mg total) by mouth 2 (two) times daily. (Patient not taking: Reported on 03/30/2016) 60 tablet 1 Not Taking  . labetalol (NORMODYNE) 300 MG tablet Take 1 tablet (300 mg total) by mouth 2 (two) times daily. 60 tablet 0 Taking  . methimazole (TAPAZOLE) 10 MG tablet Take 1 tablet (10 mg total) by mouth 3 (three) times daily. 90 tablet 0 Taking  . Prenatal Vit-Fe Fumarate-FA (PRENATAL MULTIVITAMIN) TABS tablet Take 1 tablet by mouth daily at 12 noon. 30 tablet 0 Taking    Physical Exam: LMP 11/03/2015 (LMP Unknown)  BP 134/70 mmHg  Pulse 87  Temp(Src) 98.2 F (36.8 C) (Oral)  Resp 16  Ht  (1.676 m)  Wt 117.119 kg (258 lb 3.2 oz)  BMI 41.69 kg/m2  LMP 11/03/2015 (LMP Unknown) cephalic          Fetal Heart Rate A      Mode  External filed at 07/07/2016 1200    Baseline Rate (A)  135 bpm filed at 07/07/2016 1200    Variability  6-25 BPM filed at 07/07/2016 1200    Accelerations  15 x 15 filed at 07/07/2016 1200    Decelerations  None filed at 07/07/2016 1200       Chest nl effort Abdomen gravid Extr no sign DVT Pelvic deferred            Labs on Admission:  Basic Metabolic Panel: LastLabs  No results for input(s): NA, K, CL, CO2, GLUCOSE, BUN, CREATININE, CALCIUM, MG, PHOS in the last 168 hours.   Liver Function Tests: LastLabs  No results for input(s): AST, ALT, ALKPHOS, BILITOT, PROT, ALBUMIN in the last 168 hours.   LastLabs  No results for input(s): LIPASE, AMYLASE in the last 168 hours.   LastLabs  No results for input(s): AMMONIA in the last 168 hours.   CBC: LastLabs  No results for input(s): WBC, NEUTROABS, HGB, HCT, MCV, PLT in the last 168 hours.    CBG: LastLabs  No results for input(s): GLUCAP in the last 168 hours.    Radiological Exams on Admission: ImagingResults(Last48hours)  No results found.      Assessment/Plan Present on Admission:  . Chronic hypertension during pregnancy, antepartum  IUGR and abnormal cord dopplers Code Status: Full   Disposition Plan: Admission for observation, betamethasone, repeat dopplers in 2 days  Time spent: 30 min  Adam Phenix, MD 07/07/2016 9:44 AM Faculty Practice Attending Physician [facility] Attending Phone #: [phone]  **Disclaimer: This note may have been dictated with voice recognition software. Similar sounding words can inadvertently be transcribed and this note may contain transcription errors which may not have been corrected upon publication of note.[facility] Course: *IUP: routine care. Desires BTL. Patient signed papers on day of discharge with Loyce Dys. She has no-show'ed to multiple establishment of care visits with [place] for prenatal care and it was stressed to  her to please keep her appointments and risks to her and fetus, including IUFD d/w her. Prenatal labs done O POS//RI/rpr neg/hiv neg/hepB neg//CBC normal/a1c normal If she gets to this point, she'll need a 1hr glucola in two weeks since she has steroids recently.  *FGR with abnormal dopplers:  Patient received MFM ultrasounds on 7/19, 21, 24 and 26.  On 7/26 she had AFI 8.6 with normal dopplers with plans for 2x/week dopplers and move towards delivery if any non reassuring AP testing. Prior to this she had elevated dopplers with intermittent AEDF. She received steroids on 7/19 and 7/20. [facility] precautions given to patient *cHTN: no issues. Patient continued on labetalol 300 bid during her inpatient stay. PC ratio was 810 *Thyroid disease: no issues. She had her MMZ changed from 10 bid to 15 bid.   Discharge Exam  Current Vital Signs 24h Vital Sign Ranges  T 98.2 F (36.8 C) No Data Recorded  BP 132/71 No Data Recorded  HR 94 No Data Recorded  RR 16 No Data Recorded  SaO2 96 % Not Delivered No Data Recorded       24 Hour I/O  Current Shift I/O  Time Ins Outs No intake/output data recorded. No intake/output data recorded.   General appearance: Well nourished, well developed female in no acute distress.  Cardiovascular: S1, S2 normal, no murmur, rub or gallop, regular rate and rhythm Respiratory:  Clear to auscultation bilateral. Normal respiratory effort Abdomen: gravid nttp Neuro/Psych:  Normal mood and affect.  Skin:  Warm and dry.   Discharge Disposition:  Home  Patient Instructions:  Standard, [facility] instructions given   Results Pending at Discharge:  None   Discharge Medications:   Medication List    TAKE these medications   aspirin 81 MG chewable tablet Chew 1 tablet (81 mg total) by mouth daily.   ferrous sulfate 325 (65 FE) MG tablet Commonly known as:  FERROUSUL Take 1 tablet (325 mg total) by mouth 2 (two) times daily. What changed:  when to take this   labetalol 300 MG tablet Commonly known as:  NORMODYNE Take 1 tablet (300 mg total) by mouth 2 (two) times daily.   methimazole 5 MG tablet Commonly known as:  TAPAZOLE Take 3 tablets (15 mg total) by mouth 2 (two) times daily. What changed:  medication strength  how much to take  when to take this   prenatal multivitamin Tabs tablet Take 1 tablet by mouth daily at 12 noon.        Future Appointments Date Time Provider Department Center  07/16/2016 11:00 AM Reva Bores, MD WOC-WOCA WOC  07/19/2016 9:45 AM WH-MFC [place] 2 WH-MFCUS MFC-US  07/19/2016 11:00 AM Adam Phenix, MD WOC-WOCA WOC  07/22/2016 9:00 AM Adam Phenix, MD WOC-WOCA WOC  07/22/2016 1:45 PM WH-MFC [place] 2 WH-MFCUS MFC-US  07/26/2016 9:40 AM Tereso Newcomer, MD WOC-WOCA WOC  07/26/2016 1:45 PM WH-MFC [place] 2 WH-MFCUS MFC-US  07/29/2016 8:30 AM WH-MFC [place] 1 WH-MFCUS MFC-US  07/29/2016 1:00 PM Adam Phenix, MD WOC-WOCA WOC  08/02/2016 10:30 AM WH-MFC [place] 1 WH-MFCUS MFC-US  08/05/2016 10:30 AM WH-MFC [place] 1 WH-MFCUS MFC-US  08/09/2016 10:45 AM WH-MFC [place] 2 WH-MFCUS MFC-US   08/12/2016 10:30 AM WH-MFC [place] 1 WH-MFCUS MFC-US  08/16/2016 10:30 AM WH-MFC [place] 1 WH-MFCUS MFC-US  08/19/2016 10:30 AM WH-MFC [place] 1 WH-MFCUS MFC-US    Cornelia Copa MD Attending Center for [facility])

## 2016-07-14 NOTE — Progress Notes (Signed)
LCSW following for assistance with resources and information. Met with MOB this morning, reporting she wants to go home and then she can get things prepared for baby.  MOB inconsistent with plans for child and labile in mood.  No regard for conversation yesterday with regards to adoption, reviewing information, or plan.  FOB in room, sleeping and did not engaged. MOB reports she has no other needs or concerns at this time.  Hopeful for DC as she was told by ultrasound everything looked good and she was cleared to go.  LCSW called [place] and [facility] CPS and there is nothing active at this time.  There is a hx of CPS involved with other children, unknown of outcome of CPS intervention as there are no reasons to make report.  LCSW is available if needs arise, however at this time there are no needs voiced by patient, thus LCSW will sign off.  All resources have been given to patient to review and follow up and voicing none.  Available again if needs arise, please re-consult.  Lane Hacker, MSW Clinical Social Work: System Print production planner for [employer] social worker [phone]

## 2016-07-14 NOTE — Discharge Instructions (Signed)
Intrauterine Growth Restriction °Intrauterine growth restriction (IUGR) is when your baby is not growing normally during your pregnancy. A baby with IUGR is smaller than it should be and may weigh less than normal at birth.  °IUGR can result if there is a problem with the organ that supplies your baby with oxygen and nutrition (placenta). Usually, there is no way to prevent this type of problem.  °CAUSES  °The most common cause of IUGR is a problem with the placenta or umbilical cord that causes your developing baby to get less oxygen or nutrition than needed. Other causes include: °· Poor maternal nutrition. °· Chemicals found in substances such as cigarettes, alcohol, and some illegal drugs. °· Some prescription medicines. °· Congenital defects. °· Genetic disorders. °· An infection. °· Carrying more than one baby, such as twins or triplets (multiple gestations). °RISK FACTORS °This condition is more likely to develop in women who: °· Are over the age of 35 at the time of delivery (advanced maternal age). °· Have medical conditions such as high blood pressure, diabetes, heart or kidney disease, anemia, or conditions that increase the risk for blood clotting. °· Live at a very high altitude during pregnancy. °· Have a personal history or family history of IUGR. °· Take medicines during pregnancy that are linked with congenital disabilities. °· Have a personal or family history of a genetic disorder. °· Come into contact with infected cat feces (toxoplasmosis). °· Come into contact with chickenpox (varicella) or German measles (rubella). °· Have or are at risk of getting an infectious disease such as syphilis, HIV, or herpes. °· Eat poorly during their pregnancy. °· Weigh less than 100 pounds. °· Have a family history of multiple gestations. °· Have had infertility treatments.   °· Use tobacco, illegal drugs, or drink alcohol during pregnancy. °SYMPTOMS °IUGR does not cause many symptoms. You might notice that your  baby does not move or kick very often. Also, your belly may not be as big as expected for the stage of your pregnancy. °DIAGNOSIS °This condition is diagnosed with physical and prenatal exams. Your health care provider will measure the size of your baby inside your womb during a routine screening exam using sound waves (ultrasound). Your health care provider will compare the size of your baby to the size of other babies at the same stage of development (gestational age). Your health care provider will diagnose IUGR if your baby is smaller than 90 percent of all other babies at the same gestational age.    °You may also have tests to find the cause of IUGR. These can include: °· Having fluid removed from your womb to check for signs of infection or a congenital disability (amniocentesis). °· A series of tests to monitor your baby's health (fetal monitoring). °TREATMENT  °In most cases, treatment for this condition focuses on stopping the cause of your baby's small growth. Your health care providers will monitor your pregnancy closely and help you manage your pregnancy. If this condition is caused by a placenta problem and the baby is not getting enough blood, treatment may include:  °· Medicine to start labor and deliver your baby early (induction). °· Cesarean delivery. In this procedure, your baby is delivered through a cut (incision) in the abdomen and womb (uterus). °HOME CARE INSTRUCTIONS  °· Make sure you are eating enough calories and gaining enough weight. °· Eat a balanced diet. Work with a nutrition specialist (dietitian), if needed.   °· Rest as needed. Try to get at least eight   hours of sleep every night. °· Do not drink alcohol.   °· Do not use illegal drugs.   °· Do not use any tobacco products, including cigarettes, chewing tobacco, or e-cigarettes. If you need help quitting, ask your health care provider. °· Talk to your health care provider about steps you can take to avoid infections. °· Take  medicines, vitamins, and mineral supplements only as directed by your health care provider. Make sure that your health care provider knows about all of the prescribed or over-the-counter medicines, supplements, vitamins, eye drops, and creams that you are using. °· Keep all follow-up visits as directed by your health care provider. This is important. °SEEK MEDICAL CARE IF:  °· Your baby is not moving as often as before. °  °This information is not intended to replace advice given to you by your health care provider. Make sure you discuss any questions you have with your health care provider. °  °Document Released: 09/14/2008 Document Revised: 04/22/2015 Document Reviewed: 12/02/2014 °Elsevier Interactive Patient Education ©2016 [employer] ° °

## 2016-07-14 NOTE — Progress Notes (Signed)
MD notified of elevated BP.

## 2016-07-15 ENCOUNTER — Other Ambulatory Visit (HOSPITAL_COMMUNITY): Payer: Medicaid Other

## 2016-07-16 ENCOUNTER — Encounter (HOSPITAL_COMMUNITY): Payer: Self-pay

## 2016-07-16 ENCOUNTER — Ambulatory Visit (HOSPITAL_COMMUNITY)
Admission: RE | Admit: 2016-07-16 | Discharge: 2016-07-16 | Disposition: A | Discharge status: Home / Self Care | Payer: Medicaid Other | Source: Ambulatory Visit

## 2016-07-16 ENCOUNTER — Other Ambulatory Visit: Payer: Medicaid Other | Admitting: Family Medicine

## 2016-07-16 ENCOUNTER — Other Ambulatory Visit: Payer: Self-pay | Admitting: Obstetrics and Gynecology

## 2016-07-16 ENCOUNTER — Ambulatory Visit (HOSPITAL_COMMUNITY)
Admission: RE | Admit: 2016-07-16 | Discharge: 2016-07-16 | Disposition: A | Discharge status: Home / Self Care | Payer: Medicaid Other | Source: Ambulatory Visit | Attending: Obstetrics and Gynecology | Admitting: Obstetrics and Gynecology

## 2016-07-16 DIAGNOSIS — O281 Abnormal biochemical finding on antenatal screening of mother: Secondary | ICD-10-CM | POA: Insufficient documentation

## 2016-07-16 DIAGNOSIS — O10919 Unspecified pre-existing hypertension complicating pregnancy, unspecified trimester: Secondary | ICD-10-CM

## 2016-07-16 DIAGNOSIS — O10013 Pre-existing essential hypertension complicating pregnancy, third trimester: Secondary | ICD-10-CM | POA: Diagnosis not present

## 2016-07-16 DIAGNOSIS — O99283 Endocrine, nutritional and metabolic diseases complicating pregnancy, third trimester: Secondary | ICD-10-CM | POA: Insufficient documentation

## 2016-07-16 DIAGNOSIS — O34219 Maternal care for unspecified type scar from previous cesarean delivery: Secondary | ICD-10-CM | POA: Diagnosis not present

## 2016-07-16 DIAGNOSIS — O365930 Maternal care for other known or suspected poor fetal growth, third trimester, not applicable or unspecified: Secondary | ICD-10-CM | POA: Diagnosis not present

## 2016-07-16 DIAGNOSIS — O288 Other abnormal findings on antenatal screening of mother: Secondary | ICD-10-CM

## 2016-07-16 DIAGNOSIS — E079 Disorder of thyroid, unspecified: Secondary | ICD-10-CM | POA: Diagnosis not present

## 2016-07-16 DIAGNOSIS — Z3A34 34 weeks gestation of pregnancy: Secondary | ICD-10-CM | POA: Diagnosis not present

## 2016-07-16 DIAGNOSIS — O0992 Supervision of high risk pregnancy, unspecified, second trimester: Secondary | ICD-10-CM

## 2016-07-16 DIAGNOSIS — E0590 Thyrotoxicosis, unspecified without thyrotoxic crisis or storm: Secondary | ICD-10-CM

## 2016-07-16 DIAGNOSIS — IMO0002 Reserved for concepts with insufficient information to code with codable children: Secondary | ICD-10-CM

## 2016-07-16 DIAGNOSIS — O99280 Endocrine, nutritional and metabolic diseases complicating pregnancy, unspecified trimester: Principal | ICD-10-CM

## 2016-07-16 DIAGNOSIS — O99213 Obesity complicating pregnancy, third trimester: Secondary | ICD-10-CM | POA: Insufficient documentation

## 2016-07-16 DIAGNOSIS — O280 Abnormal hematological finding on antenatal screening of mother: Secondary | ICD-10-CM

## 2016-07-19 ENCOUNTER — Ambulatory Visit (HOSPITAL_COMMUNITY): Admit: 2016-07-19 | Payer: Medicaid Other

## 2016-07-19 ENCOUNTER — Encounter: Payer: Self-pay | Admitting: Family Medicine

## 2016-07-19 ENCOUNTER — Encounter: Payer: Medicaid Other | Admitting: Obstetrics & Gynecology

## 2016-07-19 ENCOUNTER — Other Ambulatory Visit: Payer: Self-pay | Admitting: General Practice

## 2016-07-19 DIAGNOSIS — O10913 Unspecified pre-existing hypertension complicating pregnancy, third trimester: Secondary | ICD-10-CM

## 2016-07-21 ENCOUNTER — Inpatient Hospital Stay (HOSPITAL_COMMUNITY): Payer: Medicaid Other | Admitting: Anesthesiology

## 2016-07-21 ENCOUNTER — Encounter (HOSPITAL_COMMUNITY): Payer: Self-pay

## 2016-07-21 ENCOUNTER — Inpatient Hospital Stay (HOSPITAL_COMMUNITY)
Admission: AD | Admit: 2016-07-21 | Discharge: 2016-07-24 | DRG: 765 | Disposition: A | Discharge status: Home / Self Care | Payer: Medicaid Other | Source: Ambulatory Visit | Attending: Obstetrics & Gynecology | Admitting: Obstetrics & Gynecology

## 2016-07-21 ENCOUNTER — Encounter (HOSPITAL_COMMUNITY)
Admission: AD | Disposition: A | Discharge status: Home / Self Care | Payer: Self-pay | Source: Ambulatory Visit | Attending: Obstetrics & Gynecology

## 2016-07-21 DIAGNOSIS — J45909 Unspecified asthma, uncomplicated: Secondary | ICD-10-CM | POA: Diagnosis present

## 2016-07-21 DIAGNOSIS — O114 Pre-existing hypertension with pre-eclampsia, complicating childbirth: Principal | ICD-10-CM | POA: Diagnosis present

## 2016-07-21 DIAGNOSIS — O9902 Anemia complicating childbirth: Secondary | ICD-10-CM | POA: Diagnosis present

## 2016-07-21 DIAGNOSIS — O99013 Anemia complicating pregnancy, third trimester: Secondary | ICD-10-CM

## 2016-07-21 DIAGNOSIS — E0500 Thyrotoxicosis with diffuse goiter without thyrotoxic crisis or storm: Secondary | ICD-10-CM | POA: Diagnosis present

## 2016-07-21 DIAGNOSIS — IMO0002 Reserved for concepts with insufficient information to code with codable children: Secondary | ICD-10-CM

## 2016-07-21 DIAGNOSIS — J810 Acute pulmonary edema: Secondary | ICD-10-CM | POA: Diagnosis not present

## 2016-07-21 DIAGNOSIS — E0590 Thyrotoxicosis, unspecified without thyrotoxic crisis or storm: Secondary | ICD-10-CM

## 2016-07-21 DIAGNOSIS — O34219 Maternal care for unspecified type scar from previous cesarean delivery: Secondary | ICD-10-CM

## 2016-07-21 DIAGNOSIS — O10919 Unspecified pre-existing hypertension complicating pregnancy, unspecified trimester: Secondary | ICD-10-CM

## 2016-07-21 DIAGNOSIS — Z7982 Long term (current) use of aspirin: Secondary | ICD-10-CM

## 2016-07-21 DIAGNOSIS — Z6841 Body Mass Index (BMI) 40.0 and over, adult: Secondary | ICD-10-CM | POA: Diagnosis not present

## 2016-07-21 DIAGNOSIS — O34211 Maternal care for low transverse scar from previous cesarean delivery: Secondary | ICD-10-CM | POA: Diagnosis present

## 2016-07-21 DIAGNOSIS — O99214 Obesity complicating childbirth: Secondary | ICD-10-CM | POA: Diagnosis present

## 2016-07-21 DIAGNOSIS — O0993 Supervision of high risk pregnancy, unspecified, third trimester: Secondary | ICD-10-CM

## 2016-07-21 DIAGNOSIS — Z3A35 35 weeks gestation of pregnancy: Secondary | ICD-10-CM

## 2016-07-21 DIAGNOSIS — D649 Anemia, unspecified: Secondary | ICD-10-CM | POA: Diagnosis present

## 2016-07-21 DIAGNOSIS — O280 Abnormal hematological finding on antenatal screening of mother: Secondary | ICD-10-CM

## 2016-07-21 DIAGNOSIS — O9952 Diseases of the respiratory system complicating childbirth: Secondary | ICD-10-CM | POA: Diagnosis present

## 2016-07-21 DIAGNOSIS — O99280 Endocrine, nutritional and metabolic diseases complicating pregnancy, unspecified trimester: Secondary | ICD-10-CM

## 2016-07-21 DIAGNOSIS — O0992 Supervision of high risk pregnancy, unspecified, second trimester: Secondary | ICD-10-CM

## 2016-07-21 DIAGNOSIS — O365930 Maternal care for other known or suspected poor fetal growth, third trimester, not applicable or unspecified: Secondary | ICD-10-CM

## 2016-07-21 DIAGNOSIS — O1414 Severe pre-eclampsia complicating childbirth: Secondary | ICD-10-CM | POA: Diagnosis not present

## 2016-07-21 DIAGNOSIS — O119 Pre-existing hypertension with pre-eclampsia, unspecified trimester: Secondary | ICD-10-CM | POA: Diagnosis present

## 2016-07-21 DIAGNOSIS — R0602 Shortness of breath: Secondary | ICD-10-CM

## 2016-07-21 DIAGNOSIS — O99284 Endocrine, nutritional and metabolic diseases complicating childbirth: Secondary | ICD-10-CM | POA: Diagnosis present

## 2016-07-21 LAB — COMPREHENSIVE METABOLIC PANEL
ALK PHOS: 104 U/L (ref 38–126)
ALT: 16 U/L (ref 14–54)
ANION GAP: 9 (ref 5–15)
AST: 21 U/L (ref 15–41)
Albumin: 3.1 g/dL — ABNORMAL LOW (ref 3.5–5.0)
BILIRUBIN TOTAL: 0.5 mg/dL (ref 0.3–1.2)
BUN: 13 mg/dL (ref 6–20)
CALCIUM: 8.7 mg/dL — AB (ref 8.9–10.3)
CO2: 19 mmol/L — AB (ref 22–32)
CREATININE: 0.52 mg/dL (ref 0.44–1.00)
Chloride: 106 mmol/L (ref 101–111)
Glucose, Bld: 99 mg/dL (ref 65–99)
Potassium: 3.2 mmol/L — ABNORMAL LOW (ref 3.5–5.1)
SODIUM: 134 mmol/L — AB (ref 135–145)
TOTAL PROTEIN: 7.1 g/dL (ref 6.5–8.1)

## 2016-07-21 LAB — CBC WITH DIFFERENTIAL/PLATELET
BAND NEUTROPHILS: 0 %
BASOS ABS: 0 10*3/uL (ref 0.0–0.1)
BASOS PCT: 0 %
BLASTS: 0 %
EOS ABS: 0.1 10*3/uL (ref 0.0–0.7)
Eosinophils Relative: 1 %
HCT: 34.7 % — ABNORMAL LOW (ref 36.0–46.0)
HEMOGLOBIN: 11.8 g/dL — AB (ref 12.0–15.0)
LYMPHS PCT: 20 %
Lymphs Abs: 2.3 10*3/uL (ref 0.7–4.0)
MCH: 23.2 pg — ABNORMAL LOW (ref 26.0–34.0)
MCHC: 34 g/dL (ref 30.0–36.0)
MCV: 68.2 fL — ABNORMAL LOW (ref 78.0–100.0)
METAMYELOCYTES PCT: 0 %
MONO ABS: 0.8 10*3/uL (ref 0.1–1.0)
Monocytes Relative: 7 %
Myelocytes: 0 %
NEUTROS ABS: 8.5 10*3/uL — AB (ref 1.7–7.7)
Neutrophils Relative %: 72 %
OTHER: 0 %
PROMYELOCYTES ABS: 0 %
Platelets: 285 10*3/uL (ref 150–400)
RBC: 5.09 MIL/uL (ref 3.87–5.11)
RDW: 16.1 % — AB (ref 11.5–15.5)
WBC: 11.7 10*3/uL — AB (ref 4.0–10.5)
nRBC: 0 /100 WBC

## 2016-07-21 LAB — CBC
HEMATOCRIT: 33.4 % — AB (ref 36.0–46.0)
Hemoglobin: 11.1 g/dL — ABNORMAL LOW (ref 12.0–15.0)
MCH: 22.8 pg — ABNORMAL LOW (ref 26.0–34.0)
MCHC: 33.2 g/dL (ref 30.0–36.0)
MCV: 68.6 fL — AB (ref 78.0–100.0)
PLATELETS: 265 10*3/uL (ref 150–400)
RBC: 4.87 MIL/uL (ref 3.87–5.11)
RDW: 15.4 % (ref 11.5–15.5)
WBC: 7.7 10*3/uL (ref 4.0–10.5)

## 2016-07-21 LAB — RAPID URINE DRUG SCREEN, HOSP PERFORMED
Amphetamines: NOT DETECTED
BENZODIAZEPINES: NOT DETECTED
Barbiturates: NOT DETECTED
COCAINE: NOT DETECTED
OPIATES: NOT DETECTED
Tetrahydrocannabinol: NOT DETECTED

## 2016-07-21 LAB — DIC (DISSEMINATED INTRAVASCULAR COAGULATION)PANEL
D-Dimer, Quant: 1.54 ug/mL-FEU — ABNORMAL HIGH (ref 0.00–0.50)
Fibrinogen: 528 mg/dL — ABNORMAL HIGH (ref 210–475)
INR: 1.01
Prothrombin Time: 13.3 seconds (ref 11.4–15.2)
Smear Review: NONE SEEN
aPTT: 27 seconds (ref 24–36)

## 2016-07-21 LAB — URINALYSIS, ROUTINE W REFLEX MICROSCOPIC
GLUCOSE, UA: NEGATIVE mg/dL
Ketones, ur: 15 mg/dL — AB
Leukocytes, UA: NEGATIVE
Nitrite: NEGATIVE
PH: 5.5 (ref 5.0–8.0)
Protein, ur: 100 mg/dL — AB
SPECIFIC GRAVITY, URINE: 1.025 (ref 1.005–1.030)

## 2016-07-21 LAB — TYPE AND SCREEN
ABO/RH(D): O POS
Antibody Screen: NEGATIVE

## 2016-07-21 LAB — PROTEIN / CREATININE RATIO, URINE
Creatinine, Urine: 164 mg/dL
PROTEIN CREATININE RATIO: 1.16 mg/mg{creat} — AB (ref 0.00–0.15)
TOTAL PROTEIN, URINE: 191 mg/dL

## 2016-07-21 LAB — URINE MICROSCOPIC-ADD ON

## 2016-07-21 LAB — DIC (DISSEMINATED INTRAVASCULAR COAGULATION) PANEL: PLATELETS: 280 10*3/uL (ref 150–400)

## 2016-07-21 SURGERY — Surgical Case
Anesthesia: Spinal

## 2016-07-21 MED ORDER — ACETAMINOPHEN 500 MG PO TABS
1000.0000 mg | ORAL_TABLET | Freq: Four times a day (QID) | ORAL | Status: AC
  Administered 2016-07-21 – 2016-07-22 (×3): 1000 mg via ORAL
  Filled 2016-07-21 (×3): qty 2

## 2016-07-21 MED ORDER — IBUPROFEN 600 MG PO TABS
600.0000 mg | ORAL_TABLET | Freq: Four times a day (QID) | ORAL | Status: DC | PRN
  Administered 2016-07-23: 600 mg via ORAL

## 2016-07-21 MED ORDER — ZOLPIDEM TARTRATE 5 MG PO TABS
5.0000 mg | ORAL_TABLET | Freq: Every evening | ORAL | Status: DC | PRN
  Administered 2016-07-22: 5 mg via ORAL
  Filled 2016-07-21: qty 1

## 2016-07-21 MED ORDER — SOD CITRATE-CITRIC ACID 500-334 MG/5ML PO SOLN
ORAL | Status: AC
  Administered 2016-07-21: 30 mL
  Filled 2016-07-21: qty 15

## 2016-07-21 MED ORDER — LACTATED RINGERS IV BOLUS (SEPSIS)
1000.0000 mL | Freq: Once | INTRAVENOUS | Status: AC
Start: 2016-07-21 — End: 2016-07-21
  Administered 2016-07-21: 1000 mL via INTRAVENOUS

## 2016-07-21 MED ORDER — MAGNESIUM SULFATE 50 % IJ SOLN
2.0000 g/h | INTRAVENOUS | Status: AC
  Administered 2016-07-21: 2 g/h via INTRAVENOUS
  Filled 2016-07-21 (×2): qty 80

## 2016-07-21 MED ORDER — FENTANYL CITRATE (PF) 100 MCG/2ML IJ SOLN
INTRAMUSCULAR | Status: DC | PRN
  Administered 2016-07-21: 20 ug via INTRATHECAL

## 2016-07-21 MED ORDER — KETOROLAC TROMETHAMINE 30 MG/ML IJ SOLN
INTRAMUSCULAR | Status: AC
  Administered 2016-07-21: 30 mg via INTRAMUSCULAR
  Filled 2016-07-21: qty 1

## 2016-07-21 MED ORDER — SCOPOLAMINE 1 MG/3DAYS TD PT72
1.0000 | MEDICATED_PATCH | Freq: Once | TRANSDERMAL | Status: DC
  Administered 2016-07-21: 1.5 mg via TRANSDERMAL
  Filled 2016-07-21 (×2): qty 1

## 2016-07-21 MED ORDER — NALBUPHINE HCL 10 MG/ML IJ SOLN
5.0000 mg | INTRAMUSCULAR | Status: DC | PRN
  Administered 2016-07-21: 5 mg via INTRAVENOUS

## 2016-07-21 MED ORDER — PHENYLEPHRINE 40 MCG/ML (10ML) SYRINGE FOR IV PUSH (FOR BLOOD PRESSURE SUPPORT)
PREFILLED_SYRINGE | INTRAVENOUS | Status: AC
  Filled 2016-07-21: qty 10

## 2016-07-21 MED ORDER — LABETALOL HCL 200 MG PO TABS
300.0000 mg | ORAL_TABLET | Freq: Two times a day (BID) | ORAL | Status: DC
  Administered 2016-07-21 – 2016-07-24 (×5): 300 mg via ORAL
  Filled 2016-07-21 (×8): qty 1

## 2016-07-21 MED ORDER — KETOROLAC TROMETHAMINE 30 MG/ML IJ SOLN: 30.0000 mg | Freq: Once | INTRAMUSCULAR | Status: DC

## 2016-07-21 MED ORDER — SODIUM CHLORIDE 0.9 % IR SOLN
Status: DC | PRN
  Administered 2016-07-21: 1000 mL

## 2016-07-21 MED ORDER — HYDRALAZINE HCL 20 MG/ML IJ SOLN: 10.0000 mg | Freq: Once | INTRAMUSCULAR | Status: DC | PRN

## 2016-07-21 MED ORDER — MORPHINE SULFATE-NACL 0.5-0.9 MG/ML-% IV SOSY
PREFILLED_SYRINGE | INTRAVENOUS | Status: DC | PRN
  Administered 2016-07-21: .2 mg via INTRATHECAL

## 2016-07-21 MED ORDER — DOCUSATE SODIUM 100 MG PO CAPS
100.0000 mg | ORAL_CAPSULE | Freq: Every day | ORAL | Status: DC
  Administered 2016-07-22 – 2016-07-24 (×3): 100 mg via ORAL
  Filled 2016-07-21 (×4): qty 1

## 2016-07-21 MED ORDER — PROMETHAZINE HCL 25 MG/ML IJ SOLN: 6.2500 mg | INTRAMUSCULAR | Status: DC | PRN

## 2016-07-21 MED ORDER — LACTATED RINGERS IV SOLN
INTRAVENOUS | Status: DC | PRN
  Administered 2016-07-21: 40 [IU] via INTRAVENOUS

## 2016-07-21 MED ORDER — DIPHENHYDRAMINE HCL 50 MG/ML IJ SOLN
12.5000 mg | INTRAMUSCULAR | Status: DC | PRN
  Administered 2016-07-22: 12.5 mg via INTRAVENOUS
  Filled 2016-07-21: qty 1

## 2016-07-21 MED ORDER — CALCIUM CARBONATE ANTACID 500 MG PO CHEW: 2.0000 | CHEWABLE_TABLET | ORAL | Status: DC | PRN

## 2016-07-21 MED ORDER — BUTALBITAL-APAP-CAFFEINE 50-325-40 MG PO TABS
1.0000 | ORAL_TABLET | ORAL | Status: AC | PRN
  Administered 2016-07-21: 1 via ORAL
  Filled 2016-07-21: qty 1

## 2016-07-21 MED ORDER — DIPHENHYDRAMINE HCL 50 MG/ML IJ SOLN
25.0000 mg | Freq: Once | INTRAMUSCULAR | Status: AC
  Administered 2016-07-21: 25 mg via INTRAVENOUS
  Filled 2016-07-21: qty 1

## 2016-07-21 MED ORDER — PHENYLEPHRINE 8 MG IN D5W 100 ML (0.08MG/ML) PREMIX OPTIME
INJECTION | INTRAVENOUS | Status: DC | PRN
  Administered 2016-07-21: 30 ug/min via INTRAVENOUS

## 2016-07-21 MED ORDER — PROMETHAZINE HCL 25 MG/ML IJ SOLN
INTRAMUSCULAR | Status: DC | PRN
  Administered 2016-07-21 (×2): 12.5 mg via INTRAVENOUS

## 2016-07-21 MED ORDER — DEXAMETHASONE SODIUM PHOSPHATE 10 MG/ML IJ SOLN
10.0000 mg | Freq: Once | INTRAMUSCULAR | Status: AC
  Administered 2016-07-21: 10 mg via INTRAVENOUS
  Filled 2016-07-21: qty 1

## 2016-07-21 MED ORDER — MEPERIDINE HCL 25 MG/ML IJ SOLN: 6.2500 mg | INTRAMUSCULAR | Status: DC | PRN

## 2016-07-21 MED ORDER — KETOROLAC TROMETHAMINE 30 MG/ML IJ SOLN: 30.0000 mg | Freq: Four times a day (QID) | INTRAMUSCULAR | Status: DC | PRN

## 2016-07-21 MED ORDER — ACETAMINOPHEN 325 MG PO TABS: 650.0000 mg | ORAL_TABLET | ORAL | Status: DC | PRN

## 2016-07-21 MED ORDER — NALBUPHINE HCL 10 MG/ML IJ SOLN
INTRAMUSCULAR | Status: AC
  Filled 2016-07-21: qty 1

## 2016-07-21 MED ORDER — PRENATAL MULTIVITAMIN CH
1.0000 | ORAL_TABLET | Freq: Every day | ORAL | Status: DC
  Filled 2016-07-21: qty 1

## 2016-07-21 MED ORDER — NALBUPHINE HCL 10 MG/ML IJ SOLN: 5.0000 mg | INTRAMUSCULAR | Status: DC | PRN

## 2016-07-21 MED ORDER — DIPHENHYDRAMINE HCL 50 MG/ML IJ SOLN
INTRAMUSCULAR | Status: AC
  Filled 2016-07-21: qty 1

## 2016-07-21 MED ORDER — METHIMAZOLE 5 MG PO TABS
15.0000 mg | ORAL_TABLET | Freq: Two times a day (BID) | ORAL | Status: DC
  Administered 2016-07-21 – 2016-07-24 (×6): 15 mg via ORAL
  Filled 2016-07-21 (×8): qty 1

## 2016-07-21 MED ORDER — MORPHINE SULFATE (PF) 0.5 MG/ML IJ SOLN
INTRAMUSCULAR | Status: AC
  Filled 2016-07-21: qty 10

## 2016-07-21 MED ORDER — NALOXONE HCL 2 MG/2ML IJ SOSY
1.0000 ug/kg/h | PREFILLED_SYRINGE | INTRAVENOUS | Status: DC | PRN
  Filled 2016-07-21: qty 2

## 2016-07-21 MED ORDER — ONDANSETRON HCL 4 MG/2ML IJ SOLN
INTRAMUSCULAR | Status: AC
  Filled 2016-07-21: qty 2

## 2016-07-21 MED ORDER — OXYTOCIN 10 UNIT/ML IJ SOLN
INTRAMUSCULAR | Status: AC
  Filled 2016-07-21: qty 4

## 2016-07-21 MED ORDER — HYDROXYZINE HCL 25 MG PO TABS
25.0000 mg | ORAL_TABLET | Freq: Three times a day (TID) | ORAL | Status: DC | PRN
  Administered 2016-07-21: 25 mg via ORAL
  Filled 2016-07-21 (×2): qty 1

## 2016-07-21 MED ORDER — DIPHENHYDRAMINE HCL 25 MG PO CAPS: 25.0000 mg | ORAL_CAPSULE | ORAL | Status: DC | PRN

## 2016-07-21 MED ORDER — ONDANSETRON HCL 4 MG/2ML IJ SOLN: 4.0000 mg | Freq: Three times a day (TID) | INTRAMUSCULAR | Status: DC | PRN

## 2016-07-21 MED ORDER — CEFAZOLIN SODIUM-DEXTROSE 2-4 GM/100ML-% IV SOLN
INTRAVENOUS | Status: AC
  Filled 2016-07-21: qty 100

## 2016-07-21 MED ORDER — METOCLOPRAMIDE HCL 5 MG/ML IJ SOLN
10.0000 mg | Freq: Once | INTRAMUSCULAR | Status: AC
  Administered 2016-07-21: 10 mg via INTRAVENOUS
  Filled 2016-07-21: qty 2

## 2016-07-21 MED ORDER — PROMETHAZINE HCL 25 MG/ML IJ SOLN
INTRAMUSCULAR | Status: AC
  Filled 2016-07-21: qty 1

## 2016-07-21 MED ORDER — LACTATED RINGERS IV SOLN
INTRAVENOUS | Status: DC
  Administered 2016-07-21 – 2016-07-22 (×2): via INTRAVENOUS

## 2016-07-21 MED ORDER — PHENYLEPHRINE 8 MG IN D5W 100 ML (0.08MG/ML) PREMIX OPTIME
INJECTION | INTRAVENOUS | Status: AC
  Filled 2016-07-21: qty 100

## 2016-07-21 MED ORDER — LABETALOL HCL 5 MG/ML IV SOLN: 20.0000 mg | INTRAVENOUS | Status: DC | PRN

## 2016-07-21 MED ORDER — MORPHINE SULFATE-NACL 0.5-0.9 MG/ML-% IV SOSY
PREFILLED_SYRINGE | INTRAVENOUS | Status: AC
  Filled 2016-07-21: qty 1

## 2016-07-21 MED ORDER — DIPHENHYDRAMINE HCL 50 MG/ML IJ SOLN
INTRAMUSCULAR | Status: DC | PRN
  Administered 2016-07-21 (×2): 25 mg via INTRAVENOUS

## 2016-07-21 MED ORDER — BUPIVACAINE IN DEXTROSE 0.75-8.25 % IT SOLN
INTRATHECAL | Status: DC | PRN
  Administered 2016-07-21: 12 mg via INTRATHECAL

## 2016-07-21 MED ORDER — FENTANYL CITRATE (PF) 100 MCG/2ML IJ SOLN
INTRAMUSCULAR | Status: AC
  Filled 2016-07-21: qty 2

## 2016-07-21 MED ORDER — HYDROMORPHONE HCL 1 MG/ML IJ SOLN
0.2500 mg | INTRAMUSCULAR | Status: DC | PRN
Start: 2016-07-21 — End: 2016-07-21

## 2016-07-21 MED ORDER — PHENYLEPHRINE 40 MCG/ML (10ML) SYRINGE FOR IV PUSH (FOR BLOOD PRESSURE SUPPORT): PREFILLED_SYRINGE | INTRAVENOUS | Status: DC | PRN

## 2016-07-21 MED ORDER — PHENYLEPHRINE HCL 10 MG/ML IJ SOLN
INTRAMUSCULAR | Status: DC | PRN
  Administered 2016-07-21: 80 ug via INTRAVENOUS

## 2016-07-21 MED ORDER — FERROUS SULFATE 325 (65 FE) MG PO TABS
325.0000 mg | ORAL_TABLET | Freq: Two times a day (BID) | ORAL | Status: DC
  Administered 2016-07-21 – 2016-07-24 (×6): 325 mg via ORAL
  Filled 2016-07-21 (×7): qty 1

## 2016-07-21 MED ORDER — ASPIRIN 81 MG PO CHEW
81.0000 mg | CHEWABLE_TABLET | Freq: Every day | ORAL | Status: DC
  Administered 2016-07-22 – 2016-07-24 (×3): 81 mg via ORAL
  Filled 2016-07-21 (×5): qty 1

## 2016-07-21 MED ORDER — MAGNESIUM SULFATE BOLUS VIA INFUSION
4.0000 g | Freq: Once | INTRAVENOUS | Status: AC
  Administered 2016-07-21: 4 g via INTRAVENOUS
  Filled 2016-07-21: qty 500

## 2016-07-21 SURGICAL SUPPLY — 34 items
BENZOIN TINCTURE PRP APPL 2/3 (GAUZE/BANDAGES/DRESSINGS) ×2 IMPLANT
CHLORAPREP W/TINT 26ML (MISCELLANEOUS) ×2 IMPLANT
CLAMP CORD UMBIL (MISCELLANEOUS) IMPLANT
CLOTH BEACON ORANGE TIMEOUT ST (SAFETY) ×2 IMPLANT
DRAIN JACKSON PRT FLT 7MM (DRAIN) IMPLANT
DRESSING DISP NPWT PICO 4X12 (MISCELLANEOUS) ×2 IMPLANT
DRSG OPSITE POSTOP 4X10 (GAUZE/BANDAGES/DRESSINGS) ×2 IMPLANT
ELECT REM PT RETURN 9FT ADLT (ELECTROSURGICAL) ×2
ELECTRODE REM PT RTRN 9FT ADLT (ELECTROSURGICAL) ×1 IMPLANT
EVACUATOR SILICONE 100CC (DRAIN) IMPLANT
EXTRACTOR VACUUM M CUP 4 TUBE (SUCTIONS) IMPLANT
GLOVE BIO SURGEON STRL SZ7 (GLOVE) ×2 IMPLANT
GLOVE BIOGEL PI IND STRL 7.0 (GLOVE) ×2 IMPLANT
GLOVE BIOGEL PI INDICATOR 7.0 (GLOVE) ×2
GOWN STRL REUS W/TWL LRG LVL3 (GOWN DISPOSABLE) ×4 IMPLANT
KIT ABG SYR 3ML LUER SLIP (SYRINGE) IMPLANT
NEEDLE HYPO 25X5/8 SAFETYGLIDE (NEEDLE) ×2 IMPLANT
NS IRRIG 1000ML POUR BTL (IV SOLUTION) ×2 IMPLANT
PACK C SECTION WH (CUSTOM PROCEDURE TRAY) ×2 IMPLANT
PAD OB MATERNITY 4.3X12.25 (PERSONAL CARE ITEMS) ×2 IMPLANT
PENCIL SMOKE EVAC W/HOLSTER (ELECTROSURGICAL) ×2 IMPLANT
RETAINER VISCERAL (MISCELLANEOUS) ×2 IMPLANT
RETRACTOR TRAXI PANNICULUS (MISCELLANEOUS) ×1 IMPLANT
RTRCTR C-SECT PINK 25CM LRG (MISCELLANEOUS) ×2 IMPLANT
SEPRAFILM MEMBRANE 5X6 (MISCELLANEOUS) ×2 IMPLANT
SPONGE LAP 18X18 X RAY DECT (DISPOSABLE) ×2 IMPLANT
STRIP CLOSURE SKIN 1/4X4 (GAUZE/BANDAGES/DRESSINGS) ×2 IMPLANT
SUT PDS AB 0 CTX 60 (SUTURE) ×2 IMPLANT
SUT VIC AB 0 CTX 36 (SUTURE) ×5
SUT VIC AB 0 CTX36XBRD ANBCTRL (SUTURE) ×5 IMPLANT
SUT VIC AB 4-0 KS 27 (SUTURE) ×2 IMPLANT
TOWEL OR 17X24 6PK STRL BLUE (TOWEL DISPOSABLE) ×2 IMPLANT
TRAXI PANNICULUS RETRACTOR (MISCELLANEOUS) ×1
TRAY FOLEY CATH SILVER 14FR (SET/KITS/TRAYS/PACK) ×2 IMPLANT

## 2016-07-21 NOTE — Addendum Note (Signed)
Addendum  created 07/21/16 1932 by Elbert Ewings, CRNA   Sign clinical note

## 2016-07-21 NOTE — Anesthesia Preprocedure Evaluation (Addendum)
Anesthesia Evaluation  Patient identified by MRN, date of birth, ID band Patient awake    Reviewed: Allergy & Precautions, H&P , NPO status , Patient's Chart, lab work & pertinent test results, reviewed documented beta blocker date and time   Airway Mallampati: III  TM Distance: >3 FB Neck ROM: full    Dental no notable dental hx.    Pulmonary neg pulmonary ROS,    Pulmonary exam normal        Cardiovascular Normal cardiovascular exam     Neuro/Psych negative psych ROS   GI/Hepatic negative GI ROS, Neg liver ROS,   Endo/Other  Morbid obesity  Renal/GU negative Renal ROS     Musculoskeletal   Abdominal (+) + obese,   Peds  Hematology   Anesthesia Other Findings   Reproductive/Obstetrics (+) Pregnancy                            Anesthesia Physical Anesthesia Plan  ASA: III  Anesthesia Plan: Spinal   Post-op Pain Management:    Induction:   Airway Management Planned:   Additional Equipment:   Intra-op Plan:   Post-operative Plan:   Informed Consent: I have reviewed the patients History and Physical, chart, labs and discussed the procedure including the risks, benefits and alternatives for the proposed anesthesia with the patient or authorized representative who has indicated his/her understanding and acceptance.     Plan Discussed with: CRNA and Surgeon  Anesthesia Plan Comments:         Anesthesia Quick Evaluation

## 2016-07-21 NOTE — MAU Provider Note (Signed)
History     CSN: [id number]  Arrival date and time: 07/21/16 0035  Chief Complaint  Patient presents with  . Abdominal Cramping   HPI Pt is a 25 yo G5P4004 @ [redacted]w[redacted]d with multiple co-morbidities who presents via EMS with abdominal pain and HA. She reports to me her chief concern at this time is the HA. She states that the HA started ~3 hours ago, along with dizziness, & blurry vision. She has not tried taking anything for the HA and she reports that she doesn't often get them so that's one of the reasons she came in. Additionally she states that she's been having cramping in her lower abdomen every 10 minutes. She denies having N/V or fever.  Ms Adamek has had limited [facility] and was recently hospitalized for 7 days (06/1916-07/14/16) with the following diagnoses (Discharge Summary on 07/14/16): *Intrauterine pregnancy @ 33/1 weeks *FGR with abnormal umbilical artery dopplers *Chronic Hypertension *BMI 42 *Thyroid disorder (Hyperthyroidism) *History of cesarean section x 3 *Patient non compliance  OB History    Gravida Para Term Preterm AB Living   5 4 4  0 0 4   SAB TAB Ectopic Multiple Live Births   0 0 0 0 4      Past Medical History:  Diagnosis Date  . Anemia   . CAP (community acquired pneumonia) 02/13/2016  . Chronic bronchitis (HCC)   . Graves disease   . Graves disease   . Headache   . Hypertension   . Obesity   . Pneumonia "several times"    Past Surgical History:  Procedure Laterality Date  . CESAREAN SECTION    . EYE SURGERY Bilateral 2014   "for Graves Disease; eyelid retraction on left; decompression on the right""    History reviewed. No pertinent family history.  Social History  Substance Use Topics  . Smoking status: Never Smoker  . Smokeless tobacco: Never Used  . Alcohol use No    Allergies:  Allergies  Allergen Reactions  . Iodine Hives  . Shellfish Allergy     Prescriptions Prior to Admission  Medication Sig Dispense Refill Last Dose  .  aspirin 81 MG chewable tablet Chew 1 tablet (81 mg total) by mouth daily. 30 tablet 2 07/06/2016 at 0700  . ferrous sulfate (FERROUSUL) 325 (65 FE) MG tablet Take 1 tablet (325 mg total) by mouth 2 (two) times daily. (Patient taking differently: Take 325 mg by mouth daily with breakfast. ) 60 tablet 1 Past Week at Unknown time  . labetalol (NORMODYNE) 300 MG tablet Take 1 tablet (300 mg total) by mouth 2 (two) times daily. 60 tablet 0 07/07/2016 at 0700  . methimazole (TAPAZOLE) 5 MG tablet Take 3 tablets (15 mg total) by mouth 2 (two) times daily. 30 tablet 2   . Prenatal Vit-Fe Fumarate-FA (PRENATAL MULTIVITAMIN) TABS tablet Take 1 tablet by mouth daily at 12 noon. 30 tablet 0 07/07/2016 at 0700    Review of Systems  Constitutional: Negative for chills and fever.  Eyes: Positive for blurred vision.  Respiratory: Negative for cough and shortness of breath.   Cardiovascular: Negative.  Negative for chest pain.  Gastrointestinal: Positive for abdominal pain. Negative for heartburn, nausea and vomiting.  Genitourinary: Negative.   Skin: Negative for rash.  Neurological: Positive for dizziness and headaches.   Physical Exam   Blood pressure 143/69, pulse 106, temperature 98.3 F (36.8 C), temperature source Oral, resp. rate 18, last menstrual period 11/03/2015.  Physical Exam  Constitutional: She appears well-developed  and well-nourished.  HENT:  Head: Normocephalic and atraumatic.  Cardiovascular: Normal rate, regular rhythm and normal heart sounds.  Exam reveals no gallop and no friction rub.   No murmur heard. Respiratory: Effort normal and breath sounds normal. No respiratory distress.  GI: Soft. Bowel sounds are normal. There is no tenderness. There is no rebound and no guarding.  Genitourinary: Vagina normal.  Skin: Skin is warm and dry.    MAU Course  Procedures & Labs  Repeat BPs: 143/69, 129/64, 122/53, 133/66, 143/69  Results for orders placed or performed during the  hospital encounter of 07/21/16 (from the past 24 hour(s))  Protein / creatinine ratio, urine     Status: Abnormal   Collection Time: 07/21/16  1:20 AM  Result Value Ref Range   Creatinine, Urine 164.00 mg/dL   Total Protein, Urine 191 mg/dL   Protein Creatinine Ratio 1.16 (H) 0.00 - 0.15 mg/mg[Cre]  Urine rapid drug screen (hosp performed)     Status: None   Collection Time: 07/21/16  1:20 AM  Result Value Ref Range   Opiates NONE DETECTED NONE DETECTED   Cocaine NONE DETECTED NONE DETECTED   Benzodiazepines NONE DETECTED NONE DETECTED   Amphetamines NONE DETECTED NONE DETECTED   Tetrahydrocannabinol NONE DETECTED NONE DETECTED   Barbiturates NONE DETECTED NONE DETECTED  Urinalysis, Routine w reflex microscopic (not at [facility])     Status: Abnormal   Collection Time: 07/21/16  1:20 AM  Result Value Ref Range   Color, Urine YELLOW YELLOW   APPearance CLEAR CLEAR   Specific Gravity, Urine 1.025 1.005 - 1.030   pH 5.5 5.0 - 8.0   Glucose, UA NEGATIVE NEGATIVE mg/dL   Hgb urine dipstick SMALL (A) NEGATIVE   Bilirubin Urine SMALL (A) NEGATIVE   Ketones, ur 15 (A) NEGATIVE mg/dL   Protein, ur 161 (A) NEGATIVE mg/dL   Nitrite NEGATIVE NEGATIVE   Leukocytes, UA NEGATIVE NEGATIVE  Urine microscopic-add on     Status: Abnormal   Collection Time: 07/21/16  1:20 AM  Result Value Ref Range   Squamous Epithelial / LPF 6-30 (A) NONE SEEN   WBC, UA 0-5 0 - 5 WBC/hpf   RBC / HPF 0-5 0 - 5 RBC/hpf   Bacteria, UA RARE (A) NONE SEEN   Casts HYALINE CASTS (A) NEGATIVE  Comprehensive metabolic panel     Status: Abnormal   Collection Time: 07/21/16  1:22 AM  Result Value Ref Range   Sodium 134 (L) 135 - 145 mmol/L   Potassium 3.2 (L) 3.5 - 5.1 mmol/L   Chloride 106 101 - 111 mmol/L   CO2 19 (L) 22 - 32 mmol/L   Glucose, Bld 99 65 - 99 mg/dL   BUN 13 6 - 20 mg/dL   Creatinine, Ser 0.96 0.44 - 1.00 mg/dL   Calcium 8.7 (L) 8.9 - 10.3 mg/dL   Total Protein 7.1 6.5 - 8.1 g/dL   Albumin 3.1 (L) 3.5  - 5.0 g/dL   AST 21 15 - 41 U/L   ALT 16 14 - 54 U/L   Alkaline Phosphatase 104 38 - 126 U/L   Total Bilirubin 0.5 0.3 - 1.2 mg/dL   GFR calc non Af Amer >60 >60 mL/min   GFR calc Af Amer >60 >60 mL/min   Anion gap 9 5 - 15  CBC with Differential/Platelet     Status: Abnormal   Collection Time: 07/21/16  1:22 AM  Result Value Ref Range   WBC 11.7 (H) 4.0 - 10.5  K/uL   RBC 5.09 3.87 - 5.11 MIL/uL   Hemoglobin 11.8 (L) 12.0 - 15.0 g/dL   HCT 16.1 (L) 09.6 - 04.5 %   MCV 68.2 (L) 78.0 - 100.0 fL   MCH 23.2 (L) 26.0 - 34.0 pg   MCHC 34.0 30.0 - 36.0 g/dL   RDW 40.9 (H) 81.1 - 91.4 %   Platelets 285 150 - 400 K/uL   Neutrophils Relative % 72 %   Lymphocytes Relative 20 %   Monocytes Relative 7 %   Eosinophils Relative 1 %   Basophils Relative 0 %   Band Neutrophils 0 %   Metamyelocytes Relative 0 %   Myelocytes 0 %   Promyelocytes Absolute 0 %   Blasts 0 %   nRBC 0 0 /100 WBC   Other 0 %   Neutro Abs 8.5 (H) 1.7 - 7.7 K/uL   Lymphs Abs 2.3 0.7 - 4.0 K/uL   Monocytes Absolute 0.8 0.1 - 1.0 K/uL   Eosinophils Absolute 0.1 0.0 - 0.7 K/uL   Basophils Absolute 0.0 0.0 - 0.1 K/uL   Smear Review MORPHOLOGY UNREMARKABLE    Cervical exam: closed & posterior Fetal monitoring is reassuring   Assessment and Plan  Ms. Shader  is a 25 yo G5P4004 @ [redacted]w[redacted]d with multiple co-morbidities who presents via EMS with abdominal pain and HA.  HA Fioricet x1: pt reports did not resolve HA LR 1 L bolus Migraine Cocktail: pt reports did not resolve HA   Pre-Eclampsia labs: elevated Pr/Cr ratio (1.16) double from 07/07/2016 Consider admission for monitoring & further management of pre-eclampsia    Andres Ege, MD, PGY-1, MPH 07/21/2016, 1:44 AM    OB FELLOW HISTORY AND PHYSICAL ATTESTATION  I have seen and examined this patient; I agree with above documentation in the resident's note.    Jen Mow, DO OB Fellow 07/21/2016, 2:00 AM

## 2016-07-21 NOTE — Transfer of Care (Signed)
Immediate Anesthesia Transfer of Care Note  Patient: Tara [name]  Procedure(s) Performed: Procedure(s): CESAREAN SECTION (N/A)  Patient Location: PACU  Anesthesia Type:Spinal  Level of Consciousness: sedated  Airway & Oxygen Therapy: Patient Spontanous Breathing  Post-op Assessment: Report given to RN  Post vital signs: Reviewed and stable  Last Vitals:  Vitals:   07/21/16 1017 07/21/16 1128  BP: 129/79 (!) 109/59  Pulse: (!) 101 (!) 106  Resp:  18  Temp:      Last Pain:  Vitals:   07/21/16 1100  TempSrc:   PainSc: 5          Complications: No apparent anesthesia complications

## 2016-07-21 NOTE — MAU Note (Signed)
Pt presents complaining of lower abdominal cramping every 10 minutes. Also has HA, visual changes, and dizziness. Denies leaking or bleeding. Reports good fetal movement.

## 2016-07-21 NOTE — Anesthesia Postprocedure Evaluation (Signed)
Anesthesia Post Note  Patient: Tara [name]  Procedure(s) Performed: Procedure(s) (LRB): CESAREAN SECTION (N/A)  Patient location during evaluation: [facility] Anesthesia Type: Spinal Level of consciousness: awake, awake and alert and oriented Pain management: pain level controlled Vital Signs Assessment: post-procedure vital signs reviewed and stable Respiratory status: spontaneous breathing, nonlabored ventilation and respiratory function stable Cardiovascular status: stable Postop Assessment: no headache, no backache, patient able to bend at knees, no signs of nausea or vomiting and adequate PO intake Anesthetic complications: no     Last Vitals:  Vitals:   07/21/16 1700 07/21/16 1800  BP: 124/72 (!) 129/57  Pulse: (!) 110 (!) 109  Resp: 18 16  Temp: 37.1 C     Last Pain:  Vitals:   07/21/16 1800  TempSrc:   PainSc: 0-No pain   Pain Goal: Patients Stated Pain Goal: 3 (07/21/16 1535)               Rohini Jaroszewski

## 2016-07-21 NOTE — Anesthesia Postprocedure Evaluation (Signed)
Anesthesia Post Note  Patient: Tara [name]  Procedure(s) Performed: Procedure(s) (LRB): CESAREAN SECTION (N/A)  Patient location during evaluation: PACU Anesthesia Type: Spinal Level of consciousness: awake Pain management: pain level controlled Vital Signs Assessment: post-procedure vital signs reviewed and stable Respiratory status: spontaneous breathing Cardiovascular status: stable Postop Assessment: no headache, no backache, spinal receding, patient able to bend at knees and no signs of nausea or vomiting Anesthetic complications: no     Last Vitals:  Vitals:   07/21/16 1128 07/21/16 1345  BP: (!) 109/59   Pulse: (!) 106 97  Resp: 18   Temp:  36.4 C    Last Pain:  Vitals:   07/21/16 1430  TempSrc:   PainSc: 0-No pain   Pain Goal:                 Armentha Branagan JR,JOHN Evon Lopezperez

## 2016-07-21 NOTE — Progress Notes (Signed)
Pt transferred to room 373 , no change in pt status - staffing issue.

## 2016-07-21 NOTE — Anesthesia Procedure Notes (Signed)
Spinal  Patient location during procedure: OR Start time: 07/21/2016 12:03 PM End time: 07/21/2016 12:06 PM Staffing Anesthesiologist: Leilani Able Performed: anesthesiologist  Preanesthetic Checklist Completed: patient identified, site marked, surgical consent, pre-op evaluation, timeout performed, IV checked, risks and benefits discussed and monitors and equipment checked Spinal Block Patient position: sitting Prep: DuraPrep Patient monitoring: heart rate, cardiac monitor, continuous pulse ox and blood pressure Approach: midline Location: L3-4 Injection technique: single-shot Needle Needle type: Sprotte  Needle gauge: 24 G Needle length: 9 cm Needle insertion depth: 6 cm Assessment Sensory level: T4

## 2016-07-21 NOTE — Progress Notes (Signed)
Tara [name] is a 25 y.o. [id number] at [redacted]w[redacted]d with CHTN w/ superimposed severe pre eclampsia.  Headache continues in spite of migraine cocktail and Fioricet.      Subjective: Pt c/o severe headache.  She does not feel any better since admission.  She does not have a history of migraines or chronic headaches.  Pt also having visual changes.  Objective: BP 129/79   Pulse (!) 101   Temp 97.9 F (36.6 C) (Oral)   Resp 18   Ht 5\' 6"  (1.676 m)   Wt 254 lb (115.2 kg)   LMP 11/03/2015 (LMP Unknown)   BMI 41.00 kg/m  No intake/output data recorded. No intake/output data recorded.  FHT:  FHR: 120 bpm, variability: moderate,  accelerations:  Present,  decelerations:  Absent UC:   none SVE:   Dilation: Closed Effacement (%): Thick Exam by:: Dr Harrington Challenger RN  Labs: Lab Results  Component Value Date   WBC 7.7 07/21/2016   HGB 11.1 (L) 07/21/2016   HCT 33.4 (L) 07/21/2016   MCV 68.6 (L) 07/21/2016   PLT 265 07/21/2016    Assessment / Plan: 25 yo G5P4004 at 35 weeks 1 day with severe pre eclampsia.  Magnesium running.  Delivery indicated.  The risks of cesarean section discussed with the patient included but were not limited to: bleeding which may require transfusion or reoperation; infection which may require antibiotics; injury to bowel, bladder, ureters or other surrounding organs; injury to the fetus; need for additional procedures including hysterectomy in the event of a life-threatening hemorrhage; placental abnormalities wth subsequent pregnancies, incisional problems, thromboembolic phenomenon and other postoperative/anesthesia complications. The patient concurred with the proposed plan, giving informed written consent for the procedure.   Op note from [facility] requested again.  Pt states she was told there were adhesions at the time of her last c/s.  Pt does not want a BTL at this time.  She wants a nexplanon   Preeclampsia:  on magnesium sulfate and no signs or symptoms  of toxicity Fetal Wellbeing:  Category I Pain Control:  headache medication I/D:  n/a Anticipated MOD:  rpt cesarean section  Gerald Kuehl H. 07/21/2016, 10:53 AM

## 2016-07-21 NOTE — Op Note (Signed)
Cesarean Section Operative Report  Tara [name]  07/21/2016  Indications: severe pre-eclampsia, previous c-section x4   Pre-operative Diagnosis: repeat cesarean severe pre eclampsia .   Post-operative Diagnosis: Same   Surgeon: Surgeon(s) and Role:    * Lesly Dukes, MD - Primary     *Loletha Carrow MD    Assistants: none  Anesthesia: spinal    Estimated Blood Loss: 700 ml  Total IV Fluids: 800 ml LR  Urine Output:: 200 ml yellow urine  Specimens: none  Findings: Viable female infant in cephalic presentation; Apgars 9 and 9;  Meconium stained amniotic fluid; intact placenta with three vessel cord; normal uterus, fallopian tubes and ovaries bilaterally.  Baby condition / location:  NICU   Complications: no complications  Indications: Tara [name] is a 25 y.o. J1H4174 with an IUP [redacted]w[redacted]d presenting with severe pre-eclampsia and previous c-section x4.   Procedure Details:  The patient was taken back to the operative suite where spinal anesthesia was placed.  A time out was held and the above information confirmed.   After induction of anesthesia, the patient was draped and prepped in the usual sterile manner and placed in a dorsal supine position with a leftward tilt. A Pfannenstiel incision was made and carried down through the subcutaneous tissue to the fascia. Fascial incision was made and with the cautery extended transversely. The fascia was separated from the underlying rectus tissue superiorly.  There were moderate adhesions. The peritoneum was identified and bluntly entered with hemostat and extended longitudinally.  This layer was also scared and thick but relatively free off adhesions in peritoneal cavity.   [name] was placed. A low transverse uterine incision was made and extended bluntly. Delivered from cephalic presentation was a viable infant with Apgars as above.  After waiting 60 seconds for delayed cord cutting, the umbilical cord was  clamped and cut cord blood was obtained for evaluation. Cord ph was not sent. The placenta was removed Intact and appeared normal. The uterine outline, tubes and ovaries appeared normal. The uterine incision was closed with running locked sutures of 0Vicryl with an imbricating layer of the same.   Hemostasis was observed. Sepra Film was placed over the uterine incision.  The peritoneum was closed with 0 vicryl. The rectus muscles were examined and hemostasis observed. The fascia was then reapproximated with running sutures of 0 loop PDS.  The subcuticular closure was performed using 0-plain gut. The skin was closed with 3-0Vicryl.  Pico vacuum placed over incision.  Instrument, sponge, and needle counts were correct prior the abdominal closure and were correct at the conclusion of the case.     Disposition: PACU - hemodynamically stable.         Signed: Les Pou 07/21/2016 1:37 PM  Attestation of Attending Supervision of Fellow: Evaluation and management procedures were performed by the Fellow under my supervision and collaboration. I have reviewed the Fellow's note and chart, and I agree with the management and plan.  I was scrubbed for the procedure.  Lesly Dukes., MD

## 2016-07-21 NOTE — H&P (Signed)
ANTEPARTUM ADMISSION HISTORY AND PHYSICAL NOTE  History of Present Illness: Tara [name] is a 25 y.o. [id number] at [redacted]w[redacted]d admitted for cHTN w/ superimposed Pre-eclampsia.  Patient reports the fetal movement as active. Patient reports uterine contraction  activity as irreg Patient reports  vaginal bleeding as none. Patient describes fluid per vagina as Other: scant white discharge. Fetal presentation is cephalic.  Tara [name] is a 25 yo G5P4004 @ [redacted]w[redacted]d with multiple co-morbidities including gHTN who presents via EMS/MAU with HA, abdominal pain, and new onset proteinuria.   She reports to me that her chief concern at this time is the HA. She states that the HA started ~9pm (8 hrs ago) along with dizziness, & blurry vision. She has not tried taking anything for the HA and she reports that she doesn't often get them so that's one of the reasons she came in. Additionally she states that she's been having cramping in her lower abdomen approximately every 10 minutes. She denies having N/V, fever, or RUQ pain. She reports good fetal movement and scant white vaginal discharge.  Tara [name] was recently hospitalized (07/07/2016 - 07/14/2016) at this facility presenting for growth restriction and abnormal cord dopplers. Per her discharge summary on 07/14/16, her hospital course was significant for: *IUP: routine care. Desires BTL. Patient signed papers on day of discharge with Loyce Dys. She has no-show'ed to multiple establishment of care visits with [place] for prenatal care and it was stressed to her to please keep her appointments and risks to her and fetus, including IUFD d/w her. Prenatal labs done O POS//RI/rpr neg/hiv neg/hepB neg//CBC normal/a1c normal (If she gets to this point, she'll need a 1hr glucola in two weeks since she has steroids recently.)  *FGR with abnormal dopplers:  Patient received MFM ultrasounds on 7/19, 21, 24 and 26.  On 7/26 she had AFI 8.6 with normal dopplers with plans for 2x/week  dopplers and move towards delivery if any non reassuring AP testing. Prior to this she had elevated dopplers with intermittent AEDF. She received steroids on 7/19 and 7/20. FKC precautions given to patient *cHTN: no issues. Patient continued on labetalol 300 bid during her inpatient stay. PC ratio was 0.81 *Thyroid disease: no issues. She had her MMZ changed from 10 bid to 15 bid.    Patient Active Problem List   Diagnosis Date Noted  . Chronic hypertension with superimposed preeclampsia 07/21/2016  . IUGR, antenatal 07/07/2016  . Tachycardia   . HCAP (healthcare-associated pneumonia) 04/13/2016  . Hyperthyroidism in pregnancy, antepartum 04/13/2016  . Noncompliance   . Abnormal maternal serum screening test 03/30/2016  . Anemia of mother in pregnancy, antepartum 03/01/2016  . Supervision of high risk pregnancy, antepartum 02/26/2016  . Twin pregnancy with fetal loss and retention of one fetus, antepartum 02/26/2016  . Chronic hypertension during pregnancy, antepartum 02/26/2016  . Previous cesarean delivery affecting pregnancy, antepartum 02/26/2016  . Graves disease 02/11/2016  . Acute respiratory failure with hypoxia (HCC) 02/11/2016  . Morbid obesity due to excess calories [facility])     Past Medical History:  Diagnosis Date  . Anemia   . CAP (community acquired pneumonia) 02/13/2016  . Chronic bronchitis (HCC)   . Graves disease   . Graves disease   . Headache   . Hypertension   . Obesity   . Pneumonia "several times"    Past Surgical History:  Procedure Laterality Date  . CESAREAN SECTION  '08, '10, '13  . EYE SURGERY Bilateral 2014   "for Graves Disease;  eyelid retraction on left; decompression on the right""    OB History  Gravida Para Term Preterm AB Living  5 4 4  0 0 4  SAB TAB Ectopic Multiple Live Births  0 0 0 0 4    # Outcome Date GA Lbr Len/2nd Weight Sex Delivery Anes PTL Lv  5 Current           4 Term 08/14/12 [redacted]w[redacted]d  7 lb (3.175 kg) F CS-LTranv EPI N LIV   3 Term 08/18/09 [redacted]w[redacted]d  6 lb 15 oz (3.147 kg) M CS-LTranv EPI N LIV  2 Term 11/17/07 [redacted]w[redacted]d  6 lb 14 oz (3.118 kg) M CS-LTranv EPI N LIV     Complications: Fetal Intolerance  1 Term 11/11/03 [redacted]w[redacted]d  4 lb 14 oz (2.211 kg) F Vag-Spont None Y LIV      Social History   Social History  . Marital status: Single    Spouse name: N/A  . Number of children: N/A  . Years of education: N/A   Social History Main Topics  . Smoking status: Never Smoker  . Smokeless tobacco: Never Used  . Alcohol use No  . Drug use: No  . Sexual activity: Not Currently   Other Topics Concern  . None   Social History Narrative  . None    History reviewed. No pertinent family history.  Allergies  Allergen Reactions  . Iodine Hives  . Shellfish Allergy     Prescriptions Prior to Admission  Medication Sig Dispense Refill Last Dose  . aspirin 81 MG chewable tablet Chew 1 tablet (81 mg total) by mouth daily. 30 tablet 2 07/06/2016 at 0700  . ferrous sulfate (FERROUSUL) 325 (65 FE) MG tablet Take 1 tablet (325 mg total) by mouth 2 (two) times daily. (Patient taking differently: Take 325 mg by mouth daily with breakfast. ) 60 tablet 1 Past Week at Unknown time  . labetalol (NORMODYNE) 300 MG tablet Take 1 tablet (300 mg total) by mouth 2 (two) times daily. 60 tablet 0 07/07/2016 at 0700  . methimazole (TAPAZOLE) 5 MG tablet Take 3 tablets (15 mg total) by mouth 2 (two) times daily. 30 tablet 2   . Prenatal Vit-Fe Fumarate-FA (PRENATAL MULTIVITAMIN) TABS tablet Take 1 tablet by mouth daily at 12 noon. 30 tablet 0 07/07/2016 at 0700    Review of Systems Constitutional: Negative for chills and fever.  Eyes: Positive for blurred vision and photphobia Respiratory: Negative for cough and shortness of breath.   Cardiovascular: Negative.  Gastrointestinal: Positive for lower abdominal pain. Negative for heartburn, nausea and vomiting. Negative for RUQ pain. Genitourinary: Negative.   Skin: Negative for rash.   Neurological: Positive for dizziness and headaches.    Vitals:  BP 119/70   Pulse 100   Temp 97.9 F (36.6 C) (Oral)   Resp 18   Ht 5\' 6"  (1.676 m)   Wt 254 lb (115.2 kg)   LMP 11/03/2015 (LMP Unknown)   BMI 41.00 kg/m   Serial BPs in the MAU: 143/69, 129/64, 122/53, 133/66, 143/69, 134/88, 138/90, 136/48, 120/48   Physical Examination: Physical Exam  Constitutional: She appears well-developed and well-nourished.  HENT:  Head: Normocephalic and atraumatic.  Cardiovascular: borderline tachycardia, regular rhythm and normal heart sounds.  Exam reveals no gallop and no friction rub.   No murmur heard. Respiratory: Effort normal and breath sounds normal. No respiratory distress.  GI: Soft. Bowel sounds are normal. There is no tenderness. There is no rebound and no guarding.  Genitourinary: Vagina normal.  Skin: Skin is warm and dry.   Cervix: Evaluated by digital exam. and found to be closed and posterior; fetal presentation is unsure by exam and cephalic by previous US. Membranes:intact Fetal Monitoring:Baseline: 130 bpm, Moderate variability, accels, no decels Tocometer: Flat  Labs:  Results for orders placed or performed during the hospital encounter of 07/21/16 (from the past 24 hour(s))  Protein / creatinine ratio, urine   Collection Time: 07/21/16  1:20 AM  Result Value Ref Range   Creatinine, Urine 164.00 mg/dL   Total Protein, Urine 191 mg/dL   Protein Creatinine Ratio 1.16 (H) 0.00 - 0.15 mg/mg[Cre]  Urine rapid drug screen (hosp performed)   Collection Time: 07/21/16  1:20 AM  Result Value Ref Range   Opiates NONE DETECTED NONE DETECTED   Cocaine NONE DETECTED NONE DETECTED   Benzodiazepines NONE DETECTED NONE DETECTED   Amphetamines NONE DETECTED NONE DETECTED   Tetrahydrocannabinol NONE DETECTED NONE DETECTED   Barbiturates NONE DETECTED NONE DETECTED  Urinalysis, Routine w reflex microscopic (not at [facility])   Collection Time: 07/21/16  1:20 AM  Result Value  Ref Range   Color, Urine YELLOW YELLOW   APPearance CLEAR CLEAR   Specific Gravity, Urine 1.025 1.005 - 1.030   pH 5.5 5.0 - 8.0   Glucose, UA NEGATIVE NEGATIVE mg/dL   Hgb urine dipstick SMALL (A) NEGATIVE   Bilirubin Urine SMALL (A) NEGATIVE   Ketones, ur 15 (A) NEGATIVE mg/dL   Protein, ur 720 (A) NEGATIVE mg/dL   Nitrite NEGATIVE NEGATIVE   Leukocytes, UA NEGATIVE NEGATIVE  Urine microscopic-add on   Collection Time: 07/21/16  1:20 AM  Result Value Ref Range   Squamous Epithelial / LPF 6-30 (A) NONE SEEN   WBC, UA 0-5 0 - 5 WBC/hpf   RBC / HPF 0-5 0 - 5 RBC/hpf   Bacteria, UA RARE (A) NONE SEEN   Casts HYALINE CASTS (A) NEGATIVE  Comprehensive metabolic panel   Collection Time: 07/21/16  1:22 AM  Result Value Ref Range   Sodium 134 (L) 135 - 145 mmol/L   Potassium 3.2 (L) 3.5 - 5.1 mmol/L   Chloride 106 101 - 111 mmol/L   CO2 19 (L) 22 - 32 mmol/L   Glucose, Bld 99 65 - 99 mg/dL   BUN 13 6 - 20 mg/dL   Creatinine, Ser 9.47 0.44 - 1.00 mg/dL   Calcium 8.7 (L) 8.9 - 10.3 mg/dL   Total Protein 7.1 6.5 - 8.1 g/dL   Albumin 3.1 (L) 3.5 - 5.0 g/dL   AST 21 15 - 41 U/L   ALT 16 14 - 54 U/L   Alkaline Phosphatase 104 38 - 126 U/L   Total Bilirubin 0.5 0.3 - 1.2 mg/dL   GFR calc non Af Amer >60 >60 mL/min   GFR calc Af Amer >60 >60 mL/min   Anion gap 9 5 - 15  CBC with Differential/Platelet   Collection Time: 07/21/16  1:22 AM  Result Value Ref Range   WBC 11.7 (H) 4.0 - 10.5 K/uL   RBC 5.09 3.87 - 5.11 MIL/uL   Hemoglobin 11.8 (L) 12.0 - 15.0 g/dL   HCT 09.6 (L) 28.3 - 66.2 %   MCV 68.2 (L) 78.0 - 100.0 fL   MCH 23.2 (L) 26.0 - 34.0 pg   MCHC 34.0 30.0 - 36.0 g/dL   RDW 94.7 (H) 65.4 - 65.0 %   Platelets 285 150 - 400 K/uL   Neutrophils Relative % 72 %  Lymphocytes Relative 20 %   Monocytes Relative 7 %   Eosinophils Relative 1 %   Basophils Relative 0 %   Band Neutrophils 0 %   Metamyelocytes Relative 0 %   Myelocytes 0 %   Promyelocytes Absolute 0 %    Blasts 0 %   nRBC 0 0 /100 WBC   Other 0 %   Neutro Abs 8.5 (H) 1.7 - 7.7 K/uL   Lymphs Abs 2.3 0.7 - 4.0 K/uL   Monocytes Absolute 0.8 0.1 - 1.0 K/uL   Eosinophils Absolute 0.1 0.0 - 0.7 K/uL   Basophils Absolute 0.0 0.0 - 0.1 K/uL   Smear Review MORPHOLOGY UNREMARKABLE     Imaging Studies: [place] Mfm Fetal Bpp Wo Non Stress  Result Date: 07/14/2016 OBSTETRICAL ULTRASOUND: This exam was performed within a [place] Ultrasound Department. The OB US report was generated in the AS system, and faxed to the ordering physician.  This report is available in the [employer]. See the AS Obstetric US report via the Image Link.  [place] Mfm Fetal Bpp Wo Non Stress  Result Date: 07/12/2016 OBSTETRICAL ULTRASOUND: This exam was performed within a [place] Ultrasound Department. The OB US report was generated in the AS system, and faxed to the ordering physician.  This report is available in the [employer]. See the AS Obstetric US report via the Image Link.  [place] Mfm Fetal Bpp Wo Non Stress  Result Date: 07/09/2016 OBSTETRICAL ULTRASOUND: This exam was performed within a [place] Ultrasound Department. The OB US report was generated in the AS system, and faxed to the ordering physician.  This report is available in the [employer]. See the AS Obstetric US report via the Image Link.  [place] Mfm Fetal Bpp Wo Non Stress  Result Date: 07/07/2016 OBSTETRICAL ULTRASOUND: This exam was performed within a [place] Ultrasound Department. The OB US report was generated in the AS system, and faxed to the ordering physician.  This report is available in the [employer]. See the AS Obstetric US report via the Image Link.  [place] Mfm Ob Follow Up  Result Date: 07/07/2016 OBSTETRICAL ULTRASOUND: This exam was performed within a [place] Ultrasound Department. The OB US report was generated in the AS system, and faxed to the ordering physician.  This report is available in the [employer]. See the AS Obstetric US  report via the Image Link.  [place] Mfm Ua Cord Doppler  Result Date: 07/14/2016 OBSTETRICAL ULTRASOUND: This exam was performed within a [place] Ultrasound Department. The OB US report was generated in the AS system, and faxed to the ordering physician.  This report is available in the [employer]. See the AS Obstetric US report via the Image Link.  [place] Mfm Ua Cord Doppler  Result Date: 07/12/2016 OBSTETRICAL ULTRASOUND: This exam was performed within a [place] Ultrasound Department. The OB US report was generated in the AS system, and faxed to the ordering physician.  This report is available in the [employer]. See the AS Obstetric US report via the Image Link.  [place] Mfm Ua Cord Doppler  Result Date: 07/09/2016 OBSTETRICAL ULTRASOUND: This exam was performed within a [place] Ultrasound Department. The OB US report was generated in the AS system, and faxed to the ordering physician.  This report is available in the [employer]. See the AS Obstetric US report via the Image Link.  [place] Mfm Ua Cord Doppler  Result Date: 07/07/2016 OBSTETRICAL ULTRASOUND: This exam was  performed within a [place] Ultrasound Department. The OB US report was generated in the AS system, and faxed to the ordering physician.  This report is available in the [employer]. See the AS Obstetric US report via the Image Link.   Assessment and Plan: Patient Active Problem List   Diagnosis Date Noted  . Chronic hypertension with superimposed preeclampsia 07/21/2016  . IUGR, antenatal 07/07/2016  . Tachycardia   . HCAP (healthcare-associated pneumonia) 04/13/2016  . Hyperthyroidism in pregnancy, antepartum 04/13/2016  . Noncompliance   . Abnormal maternal serum screening test 03/30/2016  . Anemia of mother in pregnancy, antepartum 03/01/2016  . Supervision of high risk pregnancy, antepartum 02/26/2016  . Twin pregnancy with fetal loss and retention of one fetus, antepartum 02/26/2016  . Chronic hypertension  during pregnancy, antepartum 02/26/2016  . Previous cesarean delivery affecting pregnancy, antepartum 02/26/2016  . Graves disease 02/11/2016  . Acute respiratory failure with hypoxia (HCC) 02/11/2016  . Morbid obesity due to excess calories [facility])    Ms. Semidey is a 25 yo G5P4004 @ [redacted]w[redacted]d with multiple co-morbidities and minimal PNC who presents with cHTN superimposed with Pre-eclampsia (new onset proteinuria, HA, & vision changes).  Admit to Antenatal for management of cHTN and Pre-E  -Labetolol 300 mg PO bid as regularly scheduled for BP mgmt  -IV labetolol or hydralazine if pressures >160/90  -May consider adding magnesium   -C/S if not resolving  -Fioricet q4 hrs prn for HA   Hyperthyroid Mgmt  -methimazole 15 mg PO bid  Routine antenatal care  Andres Ege, MD, PGY-1, MPH   OB FELLOW HISTORY AND PHYSICAL ATTESTATION  I have seen and examined this patient; I agree with above documentation in the resident's note.    Jen Mow, DO OB Fellow 07/21/2016, 7:20 AM

## 2016-07-22 ENCOUNTER — Encounter (HOSPITAL_COMMUNITY): Payer: Self-pay | Admitting: Obstetrics & Gynecology

## 2016-07-22 ENCOUNTER — Ambulatory Visit (HOSPITAL_COMMUNITY): Payer: Medicaid Other

## 2016-07-22 ENCOUNTER — Encounter: Payer: Medicaid Other | Admitting: Obstetrics & Gynecology

## 2016-07-22 LAB — CBC
HEMATOCRIT: 27 % — AB (ref 36.0–46.0)
Hemoglobin: 8.9 g/dL — ABNORMAL LOW (ref 12.0–15.0)
MCH: 22.8 pg — ABNORMAL LOW (ref 26.0–34.0)
MCHC: 33 g/dL (ref 30.0–36.0)
MCV: 69.2 fL — AB (ref 78.0–100.0)
Platelets: 202 10*3/uL (ref 150–400)
RBC: 3.9 MIL/uL (ref 3.87–5.11)
RDW: 15.6 % — AB (ref 11.5–15.5)
WBC: 8.7 10*3/uL (ref 4.0–10.5)

## 2016-07-22 MED ORDER — DIBUCAINE 1 % RE OINT: 1.0000 "application " | TOPICAL_OINTMENT | RECTAL | Status: DC | PRN

## 2016-07-22 MED ORDER — IBUPROFEN 600 MG PO TABS
600.0000 mg | ORAL_TABLET | Freq: Four times a day (QID) | ORAL | Status: DC
  Administered 2016-07-22 – 2016-07-24 (×7): 600 mg via ORAL
  Filled 2016-07-22 (×8): qty 1

## 2016-07-22 MED ORDER — SODIUM CHLORIDE 0.9% FLUSH: 3.0000 mL | INTRAVENOUS | Status: DC | PRN

## 2016-07-22 MED ORDER — ZOLPIDEM TARTRATE 5 MG PO TABS: 5.0000 mg | ORAL_TABLET | Freq: Every evening | ORAL | Status: DC | PRN

## 2016-07-22 MED ORDER — OXYCODONE HCL 5 MG PO TABS: 5.0000 mg | ORAL_TABLET | ORAL | Status: DC | PRN

## 2016-07-22 MED ORDER — SIMETHICONE 80 MG PO CHEW: 80.0000 mg | CHEWABLE_TABLET | ORAL | Status: DC | PRN

## 2016-07-22 MED ORDER — PRENATAL MULTIVITAMIN CH
1.0000 | ORAL_TABLET | Freq: Every day | ORAL | Status: DC
  Administered 2016-07-22 – 2016-07-23 (×2): 1 via ORAL
  Filled 2016-07-22 (×2): qty 1

## 2016-07-22 MED ORDER — POTASSIUM CHLORIDE CRYS ER 20 MEQ PO TBCR
40.0000 meq | EXTENDED_RELEASE_TABLET | Freq: Once | ORAL | Status: AC
  Administered 2016-07-22: 40 meq via ORAL
  Filled 2016-07-22: qty 2

## 2016-07-22 MED ORDER — ALBUTEROL SULFATE (2.5 MG/3ML) 0.083% IN NEBU
2.5000 mg | INHALATION_SOLUTION | RESPIRATORY_TRACT | Status: DC | PRN
  Administered 2016-07-22 (×2): 2.5 mg via RESPIRATORY_TRACT
  Filled 2016-07-22 (×2): qty 3

## 2016-07-22 MED ORDER — NALBUPHINE HCL 10 MG/ML IJ SOLN: 5.0000 mg | Freq: Once | INTRAMUSCULAR | Status: DC | PRN

## 2016-07-22 MED ORDER — TETANUS-DIPHTH-ACELL PERTUSSIS 5-2.5-18.5 LF-MCG/0.5 IM SUSP
0.5000 mL | Freq: Once | INTRAMUSCULAR | Status: DC
  Filled 2016-07-22: qty 0.5

## 2016-07-22 MED ORDER — SIMETHICONE 80 MG PO CHEW
80.0000 mg | CHEWABLE_TABLET | Freq: Three times a day (TID) | ORAL | Status: DC
  Administered 2016-07-22 – 2016-07-24 (×7): 80 mg via ORAL
  Filled 2016-07-22 (×6): qty 1

## 2016-07-22 MED ORDER — OXYCODONE-ACETAMINOPHEN 5-325 MG PO TABS
1.0000 | ORAL_TABLET | ORAL | Status: DC | PRN
  Administered 2016-07-22 (×2): 2 via ORAL
  Administered 2016-07-23: 1 via ORAL
  Administered 2016-07-23: 2 via ORAL
  Administered 2016-07-23: 1 via ORAL
  Administered 2016-07-23: 2 via ORAL
  Administered 2016-07-23 – 2016-07-24 (×2): 1 via ORAL
  Filled 2016-07-22 (×2): qty 1
  Filled 2016-07-22 (×4): qty 2
  Filled 2016-07-22: qty 1
  Filled 2016-07-22: qty 2
  Filled 2016-07-22: qty 1

## 2016-07-22 MED ORDER — NALOXONE HCL 0.4 MG/ML IJ SOLN: 0.4000 mg | INTRAMUSCULAR | Status: DC | PRN

## 2016-07-22 MED ORDER — OXYCODONE HCL 5 MG PO TABS: 10.0000 mg | ORAL_TABLET | ORAL | Status: DC | PRN

## 2016-07-22 MED ORDER — DIPHENHYDRAMINE HCL 25 MG PO CAPS: 25.0000 mg | ORAL_CAPSULE | Freq: Four times a day (QID) | ORAL | Status: DC | PRN

## 2016-07-22 MED ORDER — OXYTOCIN 40 UNITS IN LACTATED RINGERS INFUSION - SIMPLE MED
2.5000 [IU]/h | INTRAVENOUS | Status: AC
Start: 2016-07-22 — End: 2016-07-22

## 2016-07-22 MED ORDER — COCONUT OIL OIL
1.0000 "application " | TOPICAL_OIL | Status: DC | PRN
  Filled 2016-07-22: qty 120

## 2016-07-22 MED ORDER — SENNOSIDES-DOCUSATE SODIUM 8.6-50 MG PO TABS
2.0000 | ORAL_TABLET | ORAL | Status: DC
  Administered 2016-07-23 (×2): 2 via ORAL
  Filled 2016-07-22 (×2): qty 2

## 2016-07-22 MED ORDER — MENTHOL 3 MG MT LOZG: 1.0000 | LOZENGE | OROMUCOSAL | Status: DC | PRN

## 2016-07-22 MED ORDER — LACTATED RINGERS IV SOLN
INTRAVENOUS | Status: DC
  Administered 2016-07-22: 11:00:00 via INTRAVENOUS

## 2016-07-22 MED ORDER — ACETAMINOPHEN 325 MG PO TABS: 650.0000 mg | ORAL_TABLET | ORAL | Status: DC | PRN

## 2016-07-22 MED ORDER — WITCH HAZEL-GLYCERIN EX PADS: 1.0000 "application " | MEDICATED_PAD | CUTANEOUS | Status: DC | PRN

## 2016-07-22 MED ORDER — SIMETHICONE 80 MG PO CHEW
80.0000 mg | CHEWABLE_TABLET | ORAL | Status: DC
  Administered 2016-07-23 (×2): 80 mg via ORAL
  Filled 2016-07-22 (×2): qty 1

## 2016-07-22 NOTE — Lactation Note (Signed)
This note was copied from a baby's chart. Lactation Consultation Note  Patient Name: Tara [name] [id number] Date: 07/22/2016 Reason for consult: Initial assessment;NICU baby Initial visit made.  Breastfeeding consultation services/support and Providing Breastmilk For Your Baby in NICU booklets given to patient.  This is mom's fifth baby and first time breastfeeding.  Her baby is now 65 hours old and in the NICU for prematurity and LBW.  Mom has started pumping with the symphony pump but concerned she is not obtaining milk.  Reassured her and discussed colostrum and milk coming to volume.  Instructed to pump every 3 hours on initiation phase followed by hand expression.  Mom has seen one drop with hand expression.  She [place] WIC in another county but now resides in [place].  Phone number given for her to call for appointment.  Encouraged to call for concerns/assist.  Maternal Data Has patient been taught Hand Expression?: Yes Does the patient have breastfeeding experience prior to this delivery?: No  Feeding Feeding Type: Formula Nipple Type: Slow - flow Length of feed: 20 min  LATCH Score/Interventions                      Lactation Tools Discussed/Used WIC Program: Yes Pump Review: Setup, frequency, and cleaning;Milk Storage Initiated by:: RN Date initiated:: 07/21/16   Consult Status Consult Status: Follow-up Date: 07/23/16 Follow-up type: In-patient    Huston Foley 07/22/2016, 1:45 PM

## 2016-07-22 NOTE — Progress Notes (Signed)
Patient ID: Tara [name], female   DOB: 1991-10-10, 25 y.o.   MRN: [id number]  POSTPARTUM PROGRESS NOTE  Post Partum/Op Day #1  Subjective:  Tara [name] is a 25 y.o. [id number] [redacted]w[redacted]d s/p RLTCS for chronic HTN with superimposed preeclamsia with severe features.  No acute events overnight, currently in the ICU for monitoring. Magnesium drip just stopped 1 hr ago.  Pt denies problems with ambulating, voiding or po intake.  She denies nausea or vomiting.  Pain is well controlled.  She has had flatus. She has not had bowel movement.  Lochia Minimal.   Objective: Blood pressure (!) 106/56, pulse (!) 110, temperature 98.6 F (37 C), temperature source Oral, resp. rate 16, height 5\' 6"  (1.676 m), weight 254 lb (115.2 kg), last menstrual period 11/03/2015, SpO2 98 %, unknown if currently breastfeeding.  Physical Exam:  General: alert, cooperative and no distress Lochia:normal flow Chest: CTAB Heart: RRR no m/r/g Abdomen: +BS, soft, nontender,  Uterine Fundus: firm, below umbilicus DVT Evaluation: No calf swelling or tenderness Extremities: Trace edema   Recent Labs  07/21/16 1005 07/22/16 0544  HGB 11.1* 8.9*  HCT 33.4* 27.0*    Assessment/Plan:  ASSESSMENT: Tara [name] is a 25 y.o. [id number] [redacted]w[redacted]d s/p RLTCS for chronic HTN with superimposed preeclampsia with severe features.  #Chronic HTN with superimposed Preeclampsia #Hyperthyroidism  Plan for discharge tomorrow Breastfeeding/pumping Circumcision before discharge if insurance allows Contraception: Nexplanon BP control with Labetalol Continue Methimazole    LOS: 1 day   Jen Mow, DO 07/22/2016, 1:54 PM

## 2016-07-22 NOTE — Progress Notes (Signed)
Pt transferred to room 304 at 18:40.  Pt with RR 24, lungs sounds clear, but significantly diminished in bases.  SOB with ambulation, but pt states she is comfortable when resting.  Other VSS.    Phoned Dr. Chanetta Marshall with above information and requested MD come examine pt.  Dr. Chanetta Marshall stated she was on her way.

## 2016-07-22 NOTE — Progress Notes (Signed)
S: Tara [name] has concerns about her breathing, with activity she notes she's a bit short of breath. She notes that her breathing is not made worse with laying down and she is resting comfortably in bed during our visit. She reports that she just returned from a shower and she feels better after doing that.  O: On exam, she has mild bilateral wheezes in the upper lobes on exhalation. Mildly decreased breath sounds bilaterally in lower lobes.   A&P: Asthma vs pulmonary edema -Ambulation and incentive spirometry -Albuterol nebulizer, 2.5 mg q 2 hrs prn for wheezing and shortness of breath   Andres Ege, MD, PGY-1, MPH

## 2016-07-22 NOTE — Progress Notes (Signed)
Called by RN to give pt albuterol neb. Pt RR is 24 upon my arrival. Pt states she chronically gets short of breath at home but it doesn't bother her. She doesn't take any medications to give her relief, just lets the symptoms resolve on her own. She states that she has not been around anyone sick or had any sinus issues. She does have thick mucous in her throat that she has trouble coughing up. She has not tried any medications to help with the mucous. Pt's lungs are clear throughout with good air movement.

## 2016-07-22 NOTE — Progress Notes (Signed)
Report called to Julia,RN

## 2016-07-22 NOTE — Progress Notes (Deleted)
Notified Dr. Milton Ferguson of K+, Na, and current status. MD will see patient within and hour or so and make adjustments.  Patient is stable with family at bedside.

## 2016-07-23 ENCOUNTER — Inpatient Hospital Stay (HOSPITAL_COMMUNITY): Payer: Medicaid Other

## 2016-07-23 LAB — RPR: RPR Ser Ql: NONREACTIVE

## 2016-07-23 LAB — CBC
HEMATOCRIT: 25.9 % — AB (ref 36.0–46.0)
HEMOGLOBIN: 8.3 g/dL — AB (ref 12.0–15.0)
MCH: 22.8 pg — AB (ref 26.0–34.0)
MCHC: 32 g/dL (ref 30.0–36.0)
MCV: 71.2 fL — AB (ref 78.0–100.0)
Platelets: 204 10*3/uL (ref 150–400)
RBC: 3.64 MIL/uL — AB (ref 3.87–5.11)
RDW: 16 % — ABNORMAL HIGH (ref 11.5–15.5)
WBC: 7.6 10*3/uL (ref 4.0–10.5)

## 2016-07-23 MED ORDER — POTASSIUM CHLORIDE CRYS ER 20 MEQ PO TBCR
40.0000 meq | EXTENDED_RELEASE_TABLET | Freq: Once | ORAL | Status: AC
  Administered 2016-07-23: 40 meq via ORAL
  Filled 2016-07-23: qty 2

## 2016-07-23 MED ORDER — FUROSEMIDE 10 MG/ML IJ SOLN
40.0000 mg | Freq: Once | INTRAMUSCULAR | Status: AC
  Administered 2016-07-23: 40 mg via INTRAVENOUS
  Filled 2016-07-23: qty 4

## 2016-07-23 NOTE — Progress Notes (Addendum)
Subjective: Postpartum Day 2: Cesarean Delivery Patient reports incisional pain and tolerating PO.  She complains of SOB with nonproductive cough that started before she came in and has worsened since delivery.  No fevers.  H/o pulm edema.  Objective: Vital signs in last 24 hours: Temp:  [98 F (36.7 C)-98.6 F (37 C)] 98.2 F (36.8 C) (08/04 0605) Pulse Rate:  [87-120] 87 (08/04 0605) Resp:  [12-24] 20 (08/04 0605) BP: (103-140)/(30-79) 124/50 (08/04 0628) SpO2:  [96 %-100 %] 98 % (08/04 0605) Weight:  [265 lb 4 oz (120.3 kg)] 265 lb 4 oz (120.3 kg) (08/04 0515)  Physical Exam:  General: alert, cooperative and no distress  Heart: regular rate, no murmur Lungs: rales left lung base, no wheezing. Lochia: appropriate Uterine Fundus: firm Incision: no significant drainage DVT Evaluation: No evidence of DVT seen on physical exam. Negative Homan's sign.   Recent Labs  07/21/16 1005 07/22/16 0544  HGB 11.1* 8.9*  HCT 33.4* 27.0*    Assessment/Plan: Status post Cesarean section. Doing well postoperatively.  Check CBC and 2V CXR to r/o PNA, pulm edema.  No wheezing noted. Continue current care.  Jeslyn Amsler JEHIEL 07/23/2016, 6:31 AM

## 2016-07-23 NOTE — Lactation Note (Signed)
This note was copied from a baby's chart. Lactation Consultation Note  Patient Name: Tara [name] [id number] Date: 07/23/2016 Reason for consult: Follow-up assessment;NICU baby;Infant < 6lbs;Late preterm infant   Follow up with mom of 68 hour old NICU infant. Mom reports she does not feel pumping is going well as she is only getting a few gtts. She reports she really wants her son to get EBM. She reports she is pumping every 4 hours. She is concerned that MgSO4 is affecting her supply and reports she is on Lasix due to fluid in her lungs that may affect her supply. Discussed supply and demand and enc mom to pump every 2-3 hours with DEBP for 15 minutes. Discussed milk coming to volume. Mom with a history of Graves Disease also.  Mom reports she has not been on [facility] with this pregnancy and has Medicaid established in [place] where her older children live. Advised her that hospital Social Worker may be able to assist her with figuring that out. She was asking about a [facility] loaner also. Follow up tomorrow.    Maternal Data Does the patient have breastfeeding experience prior to this delivery?: No  Feeding Feeding Type: Formula Nipple Type: Slow - flow Length of feed: 15 min  LATCH Score/Interventions                      Lactation Tools Discussed/Used WIC Program: Yes [facility]) Pump Review: Setup, frequency, and cleaning   Consult Status Consult Status: Follow-up Date: 07/24/16 Follow-up type: In-patient    Tara [name] 07/23/2016, 9:04 AM

## 2016-07-23 NOTE — Progress Notes (Signed)
CSW left a voicemail message with [facility] CPS in regards to MOB's open CPS case.   Blaine Hamper, MSW, LCSW Clinical Social Work [phone]

## 2016-07-24 ENCOUNTER — Ambulatory Visit: Payer: Self-pay

## 2016-07-24 MED ORDER — ACETAMINOPHEN 325 MG PO TABS: 650.0000 mg | ORAL_TABLET | ORAL | 0 refills | Status: DC | PRN

## 2016-07-24 MED ORDER — OXYCODONE HCL 5 MG PO TABS: 5.0000 mg | ORAL_TABLET | ORAL | 0 refills | Status: DC | PRN

## 2016-07-24 MED ORDER — METHIMAZOLE 5 MG PO TABS: 15.0000 mg | ORAL_TABLET | Freq: Two times a day (BID) | ORAL | 1 refills | Status: DC

## 2016-07-24 MED ORDER — AMOXICILLIN-POT CLAVULANATE 875-125 MG PO TABS: 1.0000 | ORAL_TABLET | Freq: Two times a day (BID) | ORAL | 0 refills | Status: DC

## 2016-07-24 MED ORDER — LABETALOL HCL 300 MG PO TABS: 300.0000 mg | ORAL_TABLET | Freq: Two times a day (BID) | ORAL | 0 refills | Status: DC

## 2016-07-24 MED ORDER — AMOXICILLIN-POT CLAVULANATE 875-125 MG PO TABS
1.0000 | ORAL_TABLET | Freq: Two times a day (BID) | ORAL | Status: DC
  Administered 2016-07-24: 1 via ORAL
  Filled 2016-07-24: qty 1

## 2016-07-24 NOTE — Lactation Note (Signed)
This note was copied from a baby's chart. Lactation Consultation Note  Patient Name: Boy Delailah Durgin [id number] Date: 07/24/2016 Reason for consult: Follow-up assessment;NICU baby;Infant < 6lbs Tried to see mom in NICU since she was discharged earlier this morning but mom was not in the NICU when [facility] visited. Mom had left her tubing & membranes on the pump on 3rd floor so I left a bag with them in it in the NICU for her to collect next time she visits. I also left a note encouraging her to call WIC [phone]); the BFPCs had tried calling last week to see about setting up an appointment to get a pump but were unable to leave a voicemail. RN on 3rd floor had said that mom planned to maybe rent a pump when she comes back to visit her baby but was not interested in getting one today. Mom was not active on [facility] during this pregnancy but had it in a different county with previous children.   Maternal Data    Feeding Feeding Type: Formula Nipple Type: Regular Length of feed: 10 min  LATCH Score/Interventions                      Lactation Tools Discussed/Used     Consult Status Consult Status: Complete    Oneal Grout 07/24/2016, 10:48 AM

## 2016-07-24 NOTE — Discharge Summary (Signed)
OB Discharge Summary     Patient Name: Tara [name] DOB: 01-Mar-1991 MRN: [id number]  Date of admission: 07/21/2016 Delivering MD: Elsie Lincoln H   Date of discharge: 07/24/2016  Admitting diagnosis: 35 WEEKS CTX Intrauterine pregnancy: [redacted]w[redacted]d     Secondary diagnosis:  Active Problems:   Chronic hypertension with superimposed preeclampsia Hyperthyroidism  Additional problems: Dental Abcess     Discharge diagnosis: Preterm Pregnancy Delivered                                                                                                Post partum procedures:none  Complications: None  Hospital course:  Sceduled C/S   25 y.o. yo [id number] at [redacted]w[redacted]d was admitted to the hospital 07/21/2016 for scheduled cesarean section with the following indication:cHTN with supperimposed pre-eclampsia.   Pt continued to have a severe headache and visual changes despite treatment of BP and attempted treatment of headache. C-section was scheduled for severe pre-eclampsia.   Membrane Rupture Time/Date: 12:36 PM ,07/21/2016   Patient delivered a Viable infant.07/21/2016  Details of operation can be found in separate operative note.  Pateint had an uncomplicated postpartum course.  She is ambulating, tolerating a regular diet, passing flatus, and urinating well. Patient is discharged home in stable condition on  07/24/16          Physical exam  Vitals:   07/23/16 1400 07/23/16 1736 07/23/16 2150 07/24/16 0555  BP: (!) 127/40 (!) 113/53 (!) 136/58 (!) 128/57  Pulse: 95 85 77 88  Resp: Temp: 98.1 F (36.7 C) 97.7 F (36.5 C)  98.3 F (36.8 C)  TempSrc: Oral Oral  Oral  SpO2: 100% 100% 99% 100%  Weight:    263 lb 0.1 oz (119.3 kg)  Height:       General: alert, cooperative and no distress Lochia: appropriate Uterine Fundus: firm Incision: Dressing is clean, dry, and intact, PICO in place and working DVT Evaluation: No evidence of DVT seen on physical exam. No significant calf/ankle  edema. Labs: Lab Results  Component Value Date   WBC 7.6 07/23/2016   HGB 8.3 (L) 07/23/2016   HCT 25.9 (L) 07/23/2016   MCV 71.2 (L) 07/23/2016   PLT 204 07/23/2016   CMP Latest Ref Rng & Units 07/21/2016  Glucose 65 - 99 mg/dL 99  BUN 6 - 20 mg/dL 13  Creatinine 1.30 - 8.65 mg/dL 7.84  Sodium 696 - 295 mmol/L 134(L)  Potassium 3.5 - 5.1 mmol/L 3.2(L)  Chloride 101 - 111 mmol/L 106  CO2 22 - 32 mmol/L 19(L)  Calcium 8.9 - 10.3 mg/dL 2.8(U)  Total Protein 6.5 - 8.1 g/dL 7.1  Total Bilirubin 0.3 - 1.2 mg/dL 0.5  Alkaline Phos 38 - 126 U/L 104  AST 15 - 41 U/L 21  ALT 14 - 54 U/L 16    Discharge instruction: per After Visit Summary and "Baby and Me Booklet".  After visit meds:    Medication List    STOP taking these medications   aspirin 81 MG chewable tablet     TAKE these medications  acetaminophen 325 MG tablet Commonly known as:  TYLENOL Take 2 tablets (650 mg total) by mouth every 4 (four) hours as needed (for pain scale < 4  OR  temperature  >/=  100.5 F).   amoxicillin-clavulanate 875-125 MG tablet Commonly known as:  AUGMENTIN Take 1 tablet by mouth 2 (two) times daily.   labetalol 300 MG tablet Commonly known as:  NORMODYNE Take 1 tablet (300 mg total) by mouth 2 (two) times daily. What changed:  Another medication with the same name was added. Make sure you understand how and when to take each.   labetalol 300 MG tablet Commonly known as:  NORMODYNE Take 1 tablet (300 mg total) by mouth 2 (two) times daily. What changed:  You were already taking a medication with the same name, and this prescription was added. Make sure you understand how and when to take each.   methimazole 5 MG tablet Commonly known as:  TAPAZOLE Take 3 tablets (15 mg total) by mouth 2 (two) times daily. What changed:  Another medication with the same name was added. Make sure you understand how and when to take each.   methimazole 5 MG tablet Commonly known as:  TAPAZOLE Take 3  tablets (15 mg total) by mouth 2 (two) times daily. What changed:  You were already taking a medication with the same name, and this prescription was added. Make sure you understand how and when to take each.   oxyCODONE 5 MG immediate release tablet Commonly known as:  Oxy IR/ROXICODONE Take 1 tablet (5 mg total) by mouth every 4 (four) hours as needed (pain scale 4-7).   prenatal multivitamin Tabs tablet Take 1 tablet by mouth daily at 12 noon.       Diet: routine diet  Activity: Advance as tolerated. Pelvic rest for 6 weeks.   Outpatient follow up:2 weeks Follow up Appt: Pt ordered nursing visits.  Postpartum contraception: Nexplanon  Newborn Data: Live born female  Birth Weight: 3 lb 15.5 oz (1800 g) APGAR: 8, 9  Baby Feeding: Bottle and Breast Disposition:NICU   07/24/2016 Ernestina Penna, MD

## 2016-07-24 NOTE — Progress Notes (Signed)
Patient discharged.  No new questions.  Patient went to NICU.

## 2016-07-24 NOTE — Progress Notes (Signed)
Discharge instructions reviewed, questions asked and answered. Prescription given to patient. Discharge paper signed.

## 2016-07-26 ENCOUNTER — Encounter (HOSPITAL_COMMUNITY): Payer: Self-pay

## 2016-07-26 ENCOUNTER — Inpatient Hospital Stay (HOSPITAL_COMMUNITY)
Admission: AD | Admit: 2016-07-26 | Discharge: 2016-07-26 | Disposition: A | Discharge status: Home / Self Care | Payer: Medicaid Other | Source: Ambulatory Visit | Attending: Obstetrics & Gynecology | Admitting: Obstetrics & Gynecology

## 2016-07-26 ENCOUNTER — Encounter: Payer: Medicaid Other | Admitting: Obstetrics & Gynecology

## 2016-07-26 ENCOUNTER — Ambulatory Visit (HOSPITAL_COMMUNITY): Payer: Medicaid Other

## 2016-07-26 ENCOUNTER — Inpatient Hospital Stay (HOSPITAL_COMMUNITY): Payer: Medicaid Other

## 2016-07-26 DIAGNOSIS — O115 Pre-existing hypertension with pre-eclampsia, complicating the puerperium: Secondary | ICD-10-CM | POA: Insufficient documentation

## 2016-07-26 DIAGNOSIS — R0602 Shortness of breath: Secondary | ICD-10-CM | POA: Diagnosis not present

## 2016-07-26 DIAGNOSIS — R42 Dizziness and giddiness: Secondary | ICD-10-CM | POA: Diagnosis not present

## 2016-07-26 DIAGNOSIS — H53149 Visual discomfort, unspecified: Secondary | ICD-10-CM | POA: Diagnosis not present

## 2016-07-26 DIAGNOSIS — R05 Cough: Secondary | ICD-10-CM | POA: Diagnosis not present

## 2016-07-26 DIAGNOSIS — R51 Headache: Secondary | ICD-10-CM | POA: Diagnosis not present

## 2016-07-26 DIAGNOSIS — E0500 Thyrotoxicosis with diffuse goiter without thyrotoxic crisis or storm: Secondary | ICD-10-CM | POA: Insufficient documentation

## 2016-07-26 DIAGNOSIS — O9089 Other complications of the puerperium, not elsewhere classified: Secondary | ICD-10-CM | POA: Insufficient documentation

## 2016-07-26 DIAGNOSIS — D649 Anemia, unspecified: Secondary | ICD-10-CM | POA: Diagnosis not present

## 2016-07-26 DIAGNOSIS — E669 Obesity, unspecified: Secondary | ICD-10-CM | POA: Insufficient documentation

## 2016-07-26 DIAGNOSIS — M7989 Other specified soft tissue disorders: Secondary | ICD-10-CM | POA: Insufficient documentation

## 2016-07-26 DIAGNOSIS — Z8701 Personal history of pneumonia (recurrent): Secondary | ICD-10-CM | POA: Diagnosis not present

## 2016-07-26 DIAGNOSIS — Z91013 Allergy to seafood: Secondary | ICD-10-CM | POA: Diagnosis not present

## 2016-07-26 DIAGNOSIS — R938 Abnormal findings on diagnostic imaging of other specified body structures: Secondary | ICD-10-CM | POA: Insufficient documentation

## 2016-07-26 DIAGNOSIS — O1093 Unspecified pre-existing hypertension complicating the puerperium: Secondary | ICD-10-CM

## 2016-07-26 DIAGNOSIS — I517 Cardiomegaly: Secondary | ICD-10-CM | POA: Diagnosis not present

## 2016-07-26 DIAGNOSIS — O1205 Gestational edema, complicating the puerperium: Secondary | ICD-10-CM

## 2016-07-26 DIAGNOSIS — R600 Localized edema: Secondary | ICD-10-CM | POA: Diagnosis not present

## 2016-07-26 DIAGNOSIS — R053 Chronic cough: Secondary | ICD-10-CM

## 2016-07-26 DIAGNOSIS — R519 Headache, unspecified: Secondary | ICD-10-CM

## 2016-07-26 DIAGNOSIS — O1415 Severe pre-eclampsia, complicating the puerperium: Secondary | ICD-10-CM | POA: Diagnosis not present

## 2016-07-26 DIAGNOSIS — Z888 Allergy status to other drugs, medicaments and biological substances status: Secondary | ICD-10-CM | POA: Diagnosis not present

## 2016-07-26 LAB — COMPREHENSIVE METABOLIC PANEL
ALBUMIN: 2.8 g/dL — AB (ref 3.5–5.0)
ALK PHOS: 91 U/L (ref 38–126)
ALT: 14 U/L (ref 14–54)
AST: 18 U/L (ref 15–41)
Anion gap: 7 (ref 5–15)
BUN: 8 mg/dL (ref 6–20)
CHLORIDE: 103 mmol/L (ref 101–111)
CO2: 26 mmol/L (ref 22–32)
Calcium: 9 mg/dL (ref 8.9–10.3)
Creatinine, Ser: 0.61 mg/dL (ref 0.44–1.00)
GFR calc Af Amer: >60 mL/min (ref >60)
GFR calc non Af Amer: >60 mL/min (ref >60)
GLUCOSE: 95 mg/dL (ref 65–99)
POTASSIUM: 4.2 mmol/L (ref 3.5–5.1)
SODIUM: 136 mmol/L (ref 135–145)
Total Bilirubin: 0.4 mg/dL (ref 0.3–1.2)
Total Protein: 6.3 g/dL — ABNORMAL LOW (ref 6.5–8.1)

## 2016-07-26 LAB — CBC
HCT: 29.8 % — ABNORMAL LOW (ref 36.0–46.0)
HEMOGLOBIN: 9.8 g/dL — AB (ref 12.0–15.0)
MCH: 23.4 pg — ABNORMAL LOW (ref 26.0–34.0)
MCHC: 32.9 g/dL (ref 30.0–36.0)
MCV: 71.3 fL — ABNORMAL LOW (ref 78.0–100.0)
PLATELETS: 300 10*3/uL (ref 150–400)
RBC: 4.18 MIL/uL (ref 3.87–5.11)
RDW: 15.9 % — AB (ref 11.5–15.5)
WBC: 8 10*3/uL (ref 4.0–10.5)

## 2016-07-26 LAB — TSH: TSH: 0.024 u[IU]/mL — ABNORMAL LOW (ref 0.350–4.500)

## 2016-07-26 MED ORDER — HYDROCOD POLST-CPM POLST ER 10-8 MG/5ML PO SUER: 5.0000 mL | Freq: Once | ORAL | Status: DC

## 2016-07-26 MED ORDER — HYDROCODONE-HOMATROPINE 5-1.5 MG/5ML PO SYRP: 5.0000 mL | ORAL_SOLUTION | ORAL | 0 refills | Status: DC | PRN

## 2016-07-26 MED ORDER — IBUPROFEN 600 MG PO TABS: 600.0000 mg | ORAL_TABLET | Freq: Four times a day (QID) | ORAL | 1 refills | Status: DC | PRN

## 2016-07-26 MED ORDER — BUTALBITAL-APAP-CAFFEINE 50-325-40 MG PO TABS: 1.0000 | ORAL_TABLET | Freq: Four times a day (QID) | ORAL | 1 refills | Status: DC | PRN

## 2016-07-26 MED ORDER — BUTALBITAL-APAP-CAFFEINE 50-325-40 MG PO TABS
2.0000 | ORAL_TABLET | Freq: Once | ORAL | Status: AC
  Administered 2016-07-26: 2 via ORAL
  Filled 2016-07-26: qty 2

## 2016-07-26 MED ORDER — HYDROCHLOROTHIAZIDE 25 MG PO TABS
25.0000 mg | ORAL_TABLET | Freq: Once | ORAL | Status: DC
  Filled 2016-07-26: qty 1

## 2016-07-26 MED ORDER — IBUPROFEN 600 MG PO TABS
600.0000 mg | ORAL_TABLET | Freq: Once | ORAL | Status: AC
  Administered 2016-07-26: 600 mg via ORAL
  Filled 2016-07-26: qty 1

## 2016-07-26 MED ORDER — HYDROCHLOROTHIAZIDE 25 MG PO TABS: 25.0000 mg | ORAL_TABLET | Freq: Every day | ORAL | 0 refills | Status: DC

## 2016-07-26 NOTE — MAU Provider Note (Signed)
Chief Complaint: Headache and Dizziness   First Provider Initiated Contact with Patient 07/26/16 0342     SUBJECTIVE HPI: Tara [name] is a 25 y.o. [id number] at 5 days status post repeat low transverse C-section for chronic hypertension with superimposed preeclampsia who presents to Maternity Admissions reporting cough, shortness of breath, dizziness, swelling in feet and incision pain. Initially reported that these were all new symptoms, but upon review of the chart the cough and shortness of breath have been going on throughout the pregnancy and patient has had pneumonia and bronchitis multiple times. Was hospitalized in February 2017 with pneumonia and in April 2017 with pulmonary edema. CT showed no evidence of pulmonary embolism. X-ray showed mild cardiomegaly and echocardiogram showed mild cardiomegaly with normal EF. At Discharge it was recommended that the patient get pulmonology workup after pregnancy. X-ray on 07/23/2016 again showed pulmonary edema. Patient received a dose of Lasix in the hospital.  Patient also has a history of chronic headaches, but reports significant worsening of headaches with new-onset vision changes 07/21/16 upon which preeclampsia with severe feature was diagnosed and resulted in her being delivered at 35 weeks. States HA's seemed to improve after delivery, but worsened again over the past day. Minimal improvement w/ Oxycodone. Pt received magnesium sulfate 07/21/2016 for seizure prophylaxis.  Patient is also very distressed by incision pain which is been significantly exacerbated by coughing. Her baby is in the NICU and she is very worried about being able to take care of him with all of these problems. Admits that she hasn't been getting enough sleep or taking care of herself since delivery.  Headache Location: Generalized Quality: "Just there" Severity: 7/10 on pain scale Duration: One week Context: Chronic hypertension with Superimposed preeclampsia Timing:  Constant Modifying factors: Minimal improvement with oxycodone. Took Fioricet during previous hospitalization and found it to be more effective. Hasn't tried anything else for the pain. Associated signs and symptoms: Positive for photophobia and blurred vision. Negative for fever, chills, neck pain or stiffness, difficulties with speech or gait, weakness, sinus congestion. Denies thunderclap headache or worst headache of her life  Past Medical History:  Diagnosis Date  . Anemia   . CAP (community acquired pneumonia) 02/13/2016  . Chronic bronchitis (HCC)   . Graves disease   . Graves disease   . Headache   . Hypertension   . Obesity   . Pneumonia "several times"   OB History  Gravida Para Term Preterm AB Living  5 5 4 1  0 5  SAB TAB Ectopic Multiple Live Births  0 0 0 0 4    # Outcome Date GA Lbr Len/2nd Weight Sex Delivery Anes PTL Lv  5 Preterm 07/21/16 [redacted]w[redacted]d  3 lb 15.5 oz (1.8 kg) M CS-LTranv Spinal  LIV  4 Term 08/14/12 [redacted]w[redacted]d  7 lb (3.175 kg) F CS-LTranv EPI N LIV  3 Term 08/18/09 [redacted]w[redacted]d  6 lb 15 oz (3.147 kg) M CS-LTranv EPI N LIV  2 Term 11/17/07 [redacted]w[redacted]d  6 lb 14 oz (3.118 kg) M CS-LTranv EPI N LIV     Complications: Fetal Intolerance  1 Term 11/11/03 [redacted]w[redacted]d  4 lb 14 oz (2.211 kg) F Vag-Spont None Y LIV     Past Surgical History:  Procedure Laterality Date  . CESAREAN SECTION    . CESAREAN SECTION N/A 07/21/2016   Procedure: CESAREAN SECTION;  Surgeon: Lesly Dukes, MD;  [facility] BIRTHING SUIT[facility];  Service: Obstetrics;  Laterality: N/A;  . EYE SURGERY Bilateral 2014   "  for Graves Disease; eyelid retraction on left; decompression on the right""   Social History   Social History  . Marital status: Single    Spouse name: N/A  . Number of children: N/A  . Years of education: N/A   Occupational History  . Not on file.   Social History Main Topics  . Smoking status: Never Smoker  . Smokeless tobacco: Never Used  . Alcohol use No  . Drug use: No  . Sexual  activity: Not Currently   Other Topics Concern  . Not on file   Social History Narrative  . No narrative on file   No current facility-administered medications on file prior to encounter.    Current Outpatient Prescriptions on File Prior to Encounter  Medication Sig Dispense Refill  . amoxicillin-clavulanate (AUGMENTIN) 875-125 MG tablet Take 1 tablet by mouth 2 (two) times daily. 14 tablet 0  . labetalol (NORMODYNE) 300 MG tablet Take 1 tablet (300 mg total) by mouth 2 (two) times daily. 90 tablet 0  . methimazole (TAPAZOLE) 5 MG tablet Take 3 tablets (15 mg total) by mouth 2 (two) times daily. 60 tablet 1  . oxyCODONE (OXY IR/ROXICODONE) 5 MG immediate release tablet Take 1 tablet (5 mg total) by mouth every 4 (four) hours as needed (pain scale 4-7). 30 tablet 0  . Prenatal Vit-Fe Fumarate-FA (PRENATAL MULTIVITAMIN) TABS tablet Take 1 tablet by mouth daily at 12 noon. 30 tablet 0  . acetaminophen (TYLENOL) 325 MG tablet Take 2 tablets (650 mg total) by mouth every 4 (four) hours as needed (for pain scale < 4  OR  temperature  >/=  100.5 F). 30 tablet 0  . labetalol (NORMODYNE) 300 MG tablet Take 1 tablet (300 mg total) by mouth 2 (two) times daily. 60 tablet 0  . methimazole (TAPAZOLE) 5 MG tablet Take 3 tablets (15 mg total) by mouth 2 (two) times daily. 30 tablet 2  . [DISCONTINUED] metoprolol (LOPRESSOR) 50 MG tablet Take 50 mg by mouth 2 (two) times daily.     Allergies  Allergen Reactions  . Iodine Hives  . Shellfish Allergy     I have reviewed the past Medical Hx, Surgical Hx, Social Hx, Allergies and Medications.   Review of Systems  Constitutional: Negative for chills and fever.  HENT: Negative for congestion and sinus pressure.   Eyes: Positive for photophobia and visual disturbance.  Respiratory: Positive for cough and shortness of breath. Negative for chest tightness and wheezing.   Cardiovascular: Positive for leg swelling. Negative for chest pain and palpitations.   Gastrointestinal: Positive for abdominal pain (incision only). Negative for nausea and vomiting.  Genitourinary: Positive for vaginal bleeding (Normal lochia). Negative for dysuria.  Musculoskeletal: Negative for gait problem, neck pain and neck stiffness.  Neurological: Positive for dizziness and headaches. Negative for seizures, syncope, facial asymmetry, speech difficulty, weakness and numbness.  Psychiatric/Behavioral: The patient is nervous/anxious.     OBJECTIVE Patient Vitals for the past 24 hrs:  BP Temp Temp src Pulse Resp SpO2  07/26/16 0515 - - - 86 - 99 %  07/26/16 0510 - - - 81 - 100 %  07/26/16 0500 - - - 81 - 99 %  07/26/16 0445 - - - 81 - 96 %  07/26/16 0440 - - - 91 - 96 %  07/26/16 0259 134/72 - - 85 - 98 %  07/26/16 0237 135/79 98.3 F (36.8 C) Oral 88 22 96 %   Constitutional: Well-developed, well-nourished female in no acute  distress.  Head: Exophthalmos of right eye Cardiovascular: normal rate and rhythm. No murmurs rubs or gallops. Respiratory: normal rate. Clear to auscultation bilaterally. Moving air well. Mild increased work of breathing.Marland Kitchen  GI: Abd soft. Incision clean, dry, intact. PICO dressing in place. Suction active. Incision appropriately tender. Fundus firm, 2/SP.  MS: Extremities nontender, 3+ pedal edema bilaterally, normal ROM. Negative Homans sign. Neurologic: Alert and oriented x 4. Deep tendon reflexes 2+. No clonus GU: Deferred  LAB RESULTS Results for orders placed or performed during the hospital encounter of 07/26/16 (from the past 24 hour(s))  CBC     Status: Abnormal   Collection Time: 07/26/16  3:27 AM  Result Value Ref Range   WBC 8.0 4.0 - 10.5 K/uL   RBC 4.18 3.87 - 5.11 MIL/uL   Hemoglobin 9.8 (L) 12.0 - 15.0 g/dL   HCT 16.1 (L) 09.6 - 04.5 %   MCV 71.3 (L) 78.0 - 100.0 fL   MCH 23.4 (L) 26.0 - 34.0 pg   MCHC 32.9 30.0 - 36.0 g/dL   RDW 40.9 (H) 81.1 - 91.4 %   Platelets 300 150 - 400 K/uL  Comprehensive metabolic panel      Status: Abnormal   Collection Time: 07/26/16  3:27 AM  Result Value Ref Range   Sodium 136 135 - 145 mmol/L   Potassium 4.2 3.5 - 5.1 mmol/L   Chloride 103 101 - 111 mmol/L   CO2 26 22 - 32 mmol/L   Glucose, Bld 95 65 - 99 mg/dL   BUN 8 6 - 20 mg/dL   Creatinine, Ser 7.82 0.44 - 1.00 mg/dL   Calcium 9.0 8.9 - 95.6 mg/dL   Total Protein 6.3 (L) 6.5 - 8.1 g/dL   Albumin 2.8 (L) 3.5 - 5.0 g/dL   AST 18 15 - 41 U/L   ALT 14 14 - 54 U/L   Alkaline Phosphatase 91 38 - 126 U/L   Total Bilirubin 0.4 0.3 - 1.2 mg/dL   GFR calc non Af Amer >60 >60 mL/min   GFR calc Af Amer >60 >60 mL/min   Anion gap 7 5 - 15    IMAGING Dg Chest 2 View  Result Date: 07/26/2016 CLINICAL DATA:  Acute onset of shortness of breath. Initial encounter. EXAM: CHEST  2 VIEW COMPARISON:  Chest radiograph performed 07/23/2016 FINDINGS: The lungs are well-aerated. Pulmonary vascularity is at the upper limits of normal. Mild retrocardiac opacity could reflect pneumonia or mild pulmonary edema. There is no evidence of pleural effusion or pneumothorax. The heart is mildly enlarged. No acute osseous abnormalities are seen. IMPRESSION: Mild cardiomegaly. Mild retrocardiac opacity could reflect pneumonia or mild pulmonary edema. Electronically Signed   By: Roanna Raider M.D.   On: 07/26/2016 04:08   MAU COURSE Orders Placed This Encounter  Procedures  . DG Chest 2 View  . CBC  . Comprehensive metabolic panel  . TSH  . Discharge patient   Meds ordered this encounter  Medications  . butalbital-acetaminophen-caffeine (FIORICET, ESGIC) 50-325-40 MG per tablet 2 tablet  . ibuprofen (ADVIL,MOTRIN) tablet 600 mg  . DISCONTD: hydrochlorothiazide (HYDRODIURIL) tablet 25 mg- hydrochlorothiazide (HYDRODIURIL) 25 MG tablet--Declines during MAU state due to not wanting to have to void frequently while visiting baby in NICU.     . chlorpheniramine-HYDROcodone (TUSSIONEX) 10-8 MG/5ML suspension 5 mL   Discussed history, exam,  labs, ultrasound with Dr. Despina Hidden. Patient is appropriate for outpatient treatment with HCTZ. No further cardiac or pulmonary testing indicated at this time.  MDM - Chronic cough and shortness of breath of uncertain etiology. Questionable mild pulmonary edema persists on x-ray today. Will treat with outpatient HCTZ. Low suspicion for PE due to chronic symptoms, negative imaging when patient previously presented with symptoms, normal oxygen saturations, absence of tachycardia and negative Homans sign. - Persistent headache and blurry vision in Patient with known preeclampsia with severe features who receive magnesium sulfate for seizure prophylaxis. Also suffering from sleep deprivation and significant stress over infant in NICU. Adequately treated with oral analgesics. No evidence of emergent intracranial process. -  ASSESSMENT 1. Pre-eclampsia superimposed on chronic hypertension, postpartum   2. Shortness of breath   3. Postpartum state   4. Chronic cough   5. Postpartum edema   6. Headache in pregnancy, postpartum     PLAN Discharge home in stable conditionPer consult with Dr. Despina Hidden. PE/pulmonary edema Precautions Headache red flags reviewed. Strongly urged patient to get adequate sleep, he eat healthy food and stay hydrated. This may improve her symptoms and anxiety. Follow-up Information    [facility] Follow up on 07/28/2016.   Specialty:  Obstetrics and Gynecology Why:  At 8:40 AM. Contact information: [address] [place] [address] [phone]       [facility] MATERN[facility] ADMISSIONS .   Why:  As needed for pregnancy-related emergencies Contact information: [address] [id number] mc [place] [address] [phone]       [facility] EMERGENCY DEPARTMENT .   Specialty:  Emergency Medicine Why:  As needed in emergencies Contact information: [address] [id number] mc [place] [address] [phone]           Medication List    STOP taking these medications   acetaminophen 325 MG tablet Commonly known as:  TYLENOL     TAKE these medications   amoxicillin-clavulanate 875-125 MG tablet Commonly known as:  AUGMENTIN Take 1 tablet by mouth 2 (two) times daily.   butalbital-acetaminophen-caffeine 50-325-40 MG tablet Commonly known as:  FIORICET Take 1-2 tablets by mouth every 6 (six) hours as needed for headache.   hydrochlorothiazide 25 MG tablet Commonly known as:  HYDRODIURIL Take 1 tablet (25 mg total) by mouth daily.   HYDROcodone-homatropine 5-1.5 MG/5ML syrup Commonly known as:  HYCODAN Take 5 mLs by mouth every 4 (four) hours as needed for cough.   ibuprofen 600 MG tablet Commonly known as:  ADVIL,MOTRIN Take 1 tablet (600 mg total) by mouth every 6 (six) hours as needed for moderate pain.   labetalol 300 MG tablet Commonly known as:  NORMODYNE Take 1 tablet (300 mg total) by mouth 2 (two) times daily. What changed:  Another medication with the same name was removed. Continue taking this medication, and follow the directions you see here.   methimazole 5 MG tablet Commonly known as:  TAPAZOLE Take 3 tablets (15 mg total) by mouth 2 (two) times daily. What changed:  Another medication with the same name was removed. Continue taking this medication, and follow the directions you see here.   oxyCODONE 5 MG immediate release tablet Commonly known as:  Oxy IR/ROXICODONE Take 1 tablet (5 mg total) by mouth every 4 (four) hours as needed (pain scale 4-7).   prenatal multivitamin Tabs tablet Take 1 tablet by mouth daily at 12 noon.      [place], [place] 07/26/2016  5:23 AM

## 2016-07-26 NOTE — Discharge Instructions (Signed)
Cough, Adult Coughing is a reflex that clears your throat and your airways. Coughing helps to heal and protect your lungs. It is normal to cough occasionally, but a cough that happens with other symptoms or lasts a long time may be a sign of a condition that needs treatment. A cough may last only 2-3 weeks (acute), or it may last longer than 8 weeks (chronic). CAUSES Coughing is commonly caused by:  Breathing in substances that irritate your lungs.  A viral or bacterial respiratory infection.  Allergies.  Asthma.  Postnasal drip.  Smoking.  Acid backing up from the stomach into the esophagus (gastroesophageal reflux).  Certain medicines.  Chronic lung problems, including COPD (or rarely, lung cancer).  Other medical conditions such as heart failure. HOME CARE INSTRUCTIONS  Pay attention to any changes in your symptoms. Take these actions to help with your discomfort:  Take medicines only as told by your health care provider.  If you were prescribed an antibiotic medicine, take it as told by your health care provider. Do not stop taking the antibiotic even if you start to feel better.  Talk with your health care provider before you take a cough suppressant medicine.  Drink enough fluid to keep your urine clear or pale yellow.  If the air is dry, use a cold steam vaporizer or humidifier in your bedroom or your home to help loosen secretions.  Avoid anything that causes you to cough at work or at home.  If your cough is worse at night, try sleeping in a semi-upright position.  Avoid cigarette smoke. If you smoke, quit smoking. If you need help quitting, ask your health care provider.  Avoid caffeine.  Avoid alcohol.  Rest as needed. SEEK MEDICAL CARE IF:   You have new symptoms.  You cough up pus.  Your cough does not get better after 2-3 weeks, or your cough gets worse.  You cannot control your cough with suppressant medicines and you are losing sleep.  You  develop pain that is getting worse or pain that is not controlled with pain medicines.  You have a fever.  You have unexplained weight loss.  You have night sweats. SEEK IMMEDIATE MEDICAL CARE IF:  You cough up blood.  You have difficulty breathing.  Your heartbeat is very fast.   This information is not intended to replace advice given to you by your health care provider. Make sure you discuss any questions you have with your health care provider.   Document Released: 06/04/2011 Document Revised: 08/27/2015 Document Reviewed: 02/12/2015 Elsevier Interactive Patient Education 2016 [employer]   General Headache A headache is pain or discomfort felt around the head or neck area. The specific cause of a headache may not be found. There are many causes and types of headaches. A few common ones are:  Tension headaches.  Migraine headaches.  Cluster headaches.  Chronic daily headaches. HOME CARE INSTRUCTIONS  Watch your condition for any changes. Take these steps to help with your condition: Managing Pain  Take over-the-counter and prescription medicines only as told by your health care provider.  Lie down in a dark, quiet room when you have a headache.  If directed, apply ice to the head and neck area:  Put ice in a plastic bag.  Place a towel between your skin and the bag.  Leave the ice on for 20 minutes, 2-3 times per day.  Use a heating pad or hot shower to apply heat to the head and neck  area as told by your health care provider.  Keep lights dim if bright lights bother you or make your headaches worse. Eating and Drinking  Eat meals on a regular schedule.  Limit alcohol use.  Decrease the amount of caffeine you drink, or stop drinking caffeine. General Instructions  Keep all follow-up visits as told by your health care provider. This is important.  Keep a headache journal to help find out what may trigger your headaches. For example, write  down:  What you eat and drink.  How much sleep you get.  Any change to your diet or medicines.  Try massage or other relaxation techniques.  Limit stress.  Sit up straight, and do not tense your muscles.  Do not use tobacco products, including cigarettes, chewing tobacco, or e-cigarettes. If you need help quitting, ask your health care provider.  Exercise regularly as told by your health care provider.  Sleep on a regular schedule. Get 7-9 hours of sleep, or the amount recommended by your health care provider. SEEK MEDICAL CARE IF:   Your symptoms are not helped by medicine.  You have a headache that is different from the usual headache.  You have nausea or you vomit.  You have a fever. SEEK IMMEDIATE MEDICAL CARE IF:   Your headache becomes severe.  You have repeated vomiting.  You have a stiff neck.  You have a loss of vision.  You have problems with speech.  You have pain in the eye or ear.  You have muscular weakness or loss of muscle control.  You lose your balance or have trouble walking.  You feel faint or pass out.  You have confusion.   This information is not intended to replace advice given to you by your health care provider. Make sure you discuss any questions you have with your health care provider.   Document Released: 12/06/2005 Document Revised: 08/27/2015 Document Reviewed: 03/31/2015 Elsevier Interactive Patient Education [employer]

## 2016-07-26 NOTE — Progress Notes (Addendum)
CSW spoke with CPS worker Cori Razor) on 06/22/16, from [place].  CPS worker informed CSW that MOB did not have an open CPS case, however, MOB's children that are residing in [place] is residing with a relative that has an open CPS case.  CPS worker is requesting community and housing resources to be shared with MOB by CSW.  At this time CPS stated that MOB does not have any plans to move back to [facility], but does not have stable housing in [place].   Blaine Hamper, MSW, LCSW Clinical Social Work [phone]

## 2016-07-28 ENCOUNTER — Ambulatory Visit: Payer: Medicaid Other | Admitting: Family

## 2016-07-29 ENCOUNTER — Ambulatory Visit (HOSPITAL_COMMUNITY): Payer: Medicaid Other

## 2016-07-29 ENCOUNTER — Encounter: Payer: Medicaid Other | Admitting: Obstetrics & Gynecology

## 2016-08-02 ENCOUNTER — Ambulatory Visit (HOSPITAL_COMMUNITY): Payer: Medicaid Other

## 2016-08-05 ENCOUNTER — Ambulatory Visit (HOSPITAL_COMMUNITY): Payer: Medicaid Other

## 2016-08-09 ENCOUNTER — Ambulatory Visit (HOSPITAL_COMMUNITY): Payer: Medicaid Other

## 2016-08-09 ENCOUNTER — Inpatient Hospital Stay (HOSPITAL_COMMUNITY)
Admission: AD | Admit: 2016-08-09 | Discharge: 2016-08-09 | Disposition: A | Discharge status: Home / Self Care | Payer: Medicaid Other | Source: Ambulatory Visit | Attending: Family Medicine | Admitting: Family Medicine

## 2016-08-09 ENCOUNTER — Encounter: Payer: Self-pay | Admitting: Family

## 2016-08-09 ENCOUNTER — Encounter: Payer: Self-pay | Admitting: Family Medicine

## 2016-08-09 DIAGNOSIS — Z4889 Encounter for other specified surgical aftercare: Secondary | ICD-10-CM

## 2016-08-09 DIAGNOSIS — Z9889 Other specified postprocedural states: Secondary | ICD-10-CM | POA: Diagnosis present

## 2016-08-09 DIAGNOSIS — Z98891 History of uterine scar from previous surgery: Secondary | ICD-10-CM

## 2016-08-09 NOTE — MAU Note (Signed)
Pt had C/S on 07/21/16.  Pt states she missed her incision appointment, tried to get worked in today.  Is unsure where her appointment was.  Has an open CP case, baby is in NICU, pt must have job before the baby goes home.  Is requesting that she get a note to return to work with no restrictions.  Denies any issues with her incision, just needs it checked to she can go back to work.  Denies bleeding or pain.

## 2016-08-09 NOTE — MAU Provider Note (Signed)
S:  Ms. APOORVA MANERS is a 25 y.o. female here for an incision check. She had a repeat, low transverse C-section on 8/2. She was scheduled for a 2 week follow up appointment with the [facility] and missed this appointment. She is here for her check up. She is requesting an incision check and a work note to return to work. She is re-scheduled in the [facility] the first week in September.  Her baby is in NICU; CPS is involved and the patient needs a note saying she can return to work.   She is without complaints. She denies pain at the incision site. She denies fever.   O:  GENERAL: Well-developed, well-nourished female in no acute distress.  LUNGS: Effort normal SKIN: Warm, dry and without erythema ABDOMEN: Incision clean, dry and intact. No odor noted. No erythema.  PSYCH: Normal mood and affect  Vitals:   08/09/16 1028  BP: 131/79  Pulse: 69  Resp: 18  Temp: 98.1 F (36.7 C)    MDM:  A:  1. Encounter for postoperative wound check   2. S/P C-section     P:  Discharge home in stable condition  Keep appointment in the clinic, call to see if you are able to get a sooner appointment Return to MAU for emergencies.   Duane Lope, NP 08/10/2016 10:34 PM

## 2016-08-09 NOTE — Discharge Instructions (Signed)
Incision Care °An incision is when a surgeon cuts into your body. After surgery, the incision needs to be cared for properly to prevent infection.  °HOW TO CARE FOR YOUR INCISION °· Take medicines only as directed by your health care provider. °· There are many different ways to close and cover an incision, including stitches, skin glue, and adhesive strips. Follow your health care provider's instructions on: °¨ Incision care. °¨ Bandage (dressing) changes and removal. °¨ Incision closure removal. °· Do not take baths, swim, or use a hot tub until your health care provider approves. You may shower as directed by your health care provider. °· Resume your normal diet and activities as directed. °· Use anti-itch medicine (such as an antihistamine) as directed by your health care provider. The incision may itch while it is healing. Do not pick or scratch at the incision. °· Drink enough fluid to keep your urine clear or pale yellow. °SEEK MEDICAL CARE IF:  °· You have drainage, redness, swelling, or pain at your incision site. °· You have muscle aches, chills, or a general ill feeling. °· You notice a bad smell coming from the incision or dressing. °· Your incision edges separate after the sutures, staples, or skin adhesive strips have been removed. °· You have persistent nausea or vomiting. °· You have a fever. °· You are dizzy. °SEEK IMMEDIATE MEDICAL CARE IF:  °· You have a rash. °· You faint. °· You have difficulty breathing. °MAKE SURE YOU:  °· Understand these instructions. °· Will watch your condition. °· Will get help right away if you are not doing well or get worse. °  °This information is not intended to replace advice given to you by your health care provider. Make sure you discuss any questions you have with your health care provider. °  °Document Released: 06/25/2005 Document Revised: 12/27/2014 Document Reviewed: 01/30/2014 °Elsevier Interactive Patient Education ©2016 [employer] ° °

## 2016-08-12 ENCOUNTER — Ambulatory Visit (HOSPITAL_COMMUNITY): Payer: Medicaid Other

## 2016-08-16 ENCOUNTER — Ambulatory Visit (HOSPITAL_COMMUNITY): Payer: Medicaid Other

## 2016-08-17 ENCOUNTER — Telehealth: Payer: Self-pay | Admitting: *Deleted

## 2016-08-17 NOTE — Telephone Encounter (Signed)
Pt left message yesterday stating that she had C/S on 8/2. She had been anemic prior to delivery and feels that she is still anemic. She requests Rx for iron supplement. I returned pt's call and left a message stating that the medication she has requested does not require a prescription. She may purchase @ any pharmacy and take once or twice daily.

## 2016-08-19 ENCOUNTER — Ambulatory Visit (HOSPITAL_COMMUNITY): Payer: Medicaid Other

## 2016-08-25 ENCOUNTER — Encounter: Payer: Self-pay | Admitting: Family

## 2016-08-25 ENCOUNTER — Encounter: Payer: Self-pay | Admitting: Family Medicine

## 2016-08-25 ENCOUNTER — Ambulatory Visit (INDEPENDENT_AMBULATORY_CARE_PROVIDER_SITE_OTHER): Payer: Medicaid Other | Admitting: Family

## 2016-08-25 VITALS — BP 110/53 | HR 74 | Wt 239.2 lb

## 2016-08-25 DIAGNOSIS — Z3200 Encounter for pregnancy test, result unknown: Secondary | ICD-10-CM

## 2016-08-25 DIAGNOSIS — Z3201 Encounter for pregnancy test, result positive: Secondary | ICD-10-CM

## 2016-08-25 DIAGNOSIS — R710 Precipitous drop in hematocrit: Secondary | ICD-10-CM

## 2016-08-25 DIAGNOSIS — D649 Anemia, unspecified: Secondary | ICD-10-CM

## 2016-08-25 LAB — POCT URINALYSIS DIP (DEVICE)
GLUCOSE, UA: NEGATIVE mg/dL
HGB URINE DIPSTICK: NEGATIVE
KETONES UR: NEGATIVE mg/dL
Leukocytes, UA: NEGATIVE
Nitrite: NEGATIVE
PROTEIN: 100 mg/dL — AB
SPECIFIC GRAVITY, URINE: 1.02 (ref 1.005–1.030)
Urobilinogen, UA: 0.2 mg/dL (ref 0.0–1.0)
pH: 6 (ref 5.0–8.0)

## 2016-08-25 LAB — POCT PREGNANCY, URINE: Preg Test, Ur: POSITIVE — AB

## 2016-08-25 NOTE — Progress Notes (Signed)
Subjective:     Tara [name] is a 25 y.o. female who presents for a postpartum visit. She is 4 weeks postpartum following a low cervical transverse Cesarean section. I have fully reviewed the prenatal and intrapartum course. The delivery was at 35 gestational weeks. Outcome: repeat cesarean section, low transverse incision. Anesthesia: spinal. Postpartum course has been 1. Baby's course has been uncomplicated. Baby is feeding by breast/bottle. Bleeding not at this time. Bowel function is normal. Bladder function is normal. Patient is sexually active. Contraception method is none. Last intercourse one week ago.   Postpartum depression screening: negative.  The following portions of the patient's history were reviewed and updated as appropriate: allergies, current medications, past family history, past medical history, past social history, past surgical history and problem list.  Review of Systems Pertinent items are noted in HPI.   Objective:    BP (!) 110/53   Pulse 74   Wt 239 lb 3.2 oz (108.5 kg)   BMI 38.61 kg/m         General:  alert, cooperative and appears stated age   Breasts:  inspection negative, no nipple discharge or bleeding, no masses or nodularity palpable  Lungs: clear to auscultation bilaterally  Heart:  regular rate and rhythm, S1, S2 normal, no murmur, click, rub or gallop  Abdomen: soft, non-tender; bowel sounds normal; no masses,  no organomegaly; incision site without signs of infection.   Vulva:  normal  Vagina: normal vagina, no discharge, exudate, lesion, or erythema; healed well  Cervix:  no cervical motion tenderness  Corpus: normal size, contour, position, consistency, mobility, non-tender  Adnexa:  normal adnexa  Rectal Exam: Not performed.   Results for orders placed or performed in visit on 08/25/16 (from the past 24 hour(s))  POCT urinalysis dip (device)     Status: Abnormal   Collection Time: 08/25/16 10:14 AM  Result Value Ref Range   Glucose, UA  NEGATIVE NEGATIVE mg/dL   Bilirubin Urine SMALL (A) NEGATIVE   Ketones, ur NEGATIVE NEGATIVE mg/dL   Specific Gravity, Urine 1.020 1.005 - 1.030   Hgb urine dipstick NEGATIVE NEGATIVE   pH 6.0 5.0 - 8.0   Protein, ur 100 (A) NEGATIVE mg/dL   Urobilinogen, UA 0.2 0.0 - 1.0 mg/dL   Nitrite NEGATIVE NEGATIVE   Leukocytes, UA NEGATIVE NEGATIVE  Pregnancy, urine POC     Status: Abnormal   Collection Time: 08/25/16 11:00 AM  Result Value Ref Range   Preg Test, Ur POSITIVE (A) NEGATIVE    Assessment:     Normal postpartum exam. Pap smear not done at today's visit.  Pregnancy, incidental Finding Plan:    1. Obtain BHCG and CBC 2./Schedule ultrasound in two weeks 3.  Follow-up for ultrasound results  Marlis Edelson, CNM

## 2016-08-26 LAB — CBC
HEMATOCRIT: 39.1 % (ref 35.0–45.0)
HEMOGLOBIN: 12.6 g/dL (ref 11.7–15.5)
MCH: 22.3 pg — AB (ref 27.0–33.0)
MCHC: 32.2 g/dL (ref 32.0–36.0)
MCV: 69.3 fL — ABNORMAL LOW (ref 80.0–100.0)
MPV: 10.6 fL (ref 7.5–12.5)
Platelets: 365 10*3/uL (ref 140–400)
RBC: 5.64 MIL/uL — AB (ref 3.80–5.10)
RDW: 14.4 % (ref 11.0–15.0)
WBC: 6.2 10*3/uL (ref 3.8–10.8)

## 2016-08-26 LAB — HCG, QUANTITATIVE, PREGNANCY

## 2016-08-29 ENCOUNTER — Encounter (HOSPITAL_COMMUNITY): Payer: Self-pay | Admitting: Family Medicine

## 2016-08-29 ENCOUNTER — Emergency Department (HOSPITAL_COMMUNITY)
Admission: EM | Admit: 2016-08-29 | Discharge: 2016-08-29 | Disposition: A | Discharge status: Home / Self Care | Payer: Medicaid Other | Attending: Emergency Medicine | Admitting: Emergency Medicine

## 2016-08-29 ENCOUNTER — Emergency Department (HOSPITAL_COMMUNITY): Payer: Medicaid Other

## 2016-08-29 DIAGNOSIS — R0602 Shortness of breath: Secondary | ICD-10-CM | POA: Diagnosis not present

## 2016-08-29 DIAGNOSIS — I10 Essential (primary) hypertension: Secondary | ICD-10-CM | POA: Insufficient documentation

## 2016-08-29 DIAGNOSIS — E0590 Thyrotoxicosis, unspecified without thyrotoxic crisis or storm: Secondary | ICD-10-CM

## 2016-08-29 DIAGNOSIS — Z79899 Other long term (current) drug therapy: Secondary | ICD-10-CM | POA: Diagnosis not present

## 2016-08-29 DIAGNOSIS — R079 Chest pain, unspecified: Secondary | ICD-10-CM

## 2016-08-29 DIAGNOSIS — R072 Precordial pain: Secondary | ICD-10-CM | POA: Diagnosis not present

## 2016-08-29 HISTORY — DX: Acute myocardial infarction, unspecified: I21.9

## 2016-08-29 LAB — BASIC METABOLIC PANEL
Anion gap: 10 (ref 5–15)
BUN: 33 mg/dL — ABNORMAL HIGH (ref 6–20)
CO2: 18 mmol/L — ABNORMAL LOW (ref 22–32)
Calcium: 9.3 mg/dL (ref 8.9–10.3)
Chloride: 106 mmol/L (ref 101–111)
Creatinine, Ser: 0.98 mg/dL (ref 0.44–1.00)
GFR calc Af Amer: >60 mL/min (ref >60)
GFR calc non Af Amer: >60 mL/min (ref >60)
Glucose, Bld: 139 mg/dL — ABNORMAL HIGH (ref 65–99)
Potassium: 3.8 mmol/L (ref 3.5–5.1)
Sodium: 134 mmol/L — ABNORMAL LOW (ref 135–145)

## 2016-08-29 LAB — CBC WITH DIFFERENTIAL/PLATELET
Basophils Absolute: 0 10*3/uL (ref 0.0–0.1)
Basophils Relative: 0 %
Eosinophils Absolute: 0.3 10*3/uL (ref 0.0–0.7)
Eosinophils Relative: 4 %
HCT: 46.2 % — ABNORMAL HIGH (ref 36.0–46.0)
Hemoglobin: 14.5 g/dL (ref 12.0–15.0)
Lymphocytes Relative: 35 %
Lymphs Abs: 2.8 10*3/uL (ref 0.7–4.0)
MCH: 22.4 pg — ABNORMAL LOW (ref 26.0–34.0)
MCHC: 31.4 g/dL (ref 30.0–36.0)
MCV: 71.4 fL — ABNORMAL LOW (ref 78.0–100.0)
Monocytes Absolute: 0.9 10*3/uL (ref 0.1–1.0)
Monocytes Relative: 11 %
Neutro Abs: 4 10*3/uL (ref 1.7–7.7)
Neutrophils Relative %: 50 %
Platelets: 339 10*3/uL (ref 150–400)
RBC: 6.47 MIL/uL — ABNORMAL HIGH (ref 3.87–5.11)
RDW: 14 % (ref 11.5–15.5)
WBC: 8 10*3/uL (ref 4.0–10.5)

## 2016-08-29 LAB — T4, FREE: Free T4: 4.98 ng/dL — ABNORMAL HIGH (ref 0.61–1.12)

## 2016-08-29 LAB — I-STAT BETA HCG BLOOD, ED (MC, WL, AP ONLY): I-stat hCG, quantitative: <5 m[IU]/mL (ref <5)

## 2016-08-29 LAB — TSH: TSH: 0.028 u[IU]/mL — ABNORMAL LOW (ref 0.350–4.500)

## 2016-08-29 LAB — TROPONIN I: Troponin I: <0.03 ng/mL (ref <0.03)

## 2016-08-29 LAB — BRAIN NATRIURETIC PEPTIDE: B Natriuretic Peptide: 15.2 pg/mL (ref 0.0–100.0)

## 2016-08-29 LAB — MAGNESIUM: Magnesium: 2.2 mg/dL (ref 1.7–2.4)

## 2016-08-29 MED ORDER — METHIMAZOLE 10 MG PO TABS
20.0000 mg | ORAL_TABLET | Freq: Once | ORAL | Status: AC
  Administered 2016-08-29: 20 mg via ORAL
  Filled 2016-08-29: qty 2

## 2016-08-29 MED ORDER — LABETALOL HCL 300 MG PO TABS: 300.0000 mg | ORAL_TABLET | Freq: Two times a day (BID) | ORAL | 0 refills | Status: DC

## 2016-08-29 MED ORDER — METHIMAZOLE 5 MG PO TABS: 15.0000 mg | ORAL_TABLET | Freq: Two times a day (BID) | ORAL | 1 refills | Status: DC

## 2016-08-29 MED ORDER — DEXAMETHASONE SODIUM PHOSPHATE 10 MG/ML IJ SOLN
10.0000 mg | Freq: Once | INTRAMUSCULAR | Status: AC
  Administered 2016-08-29: 10 mg via INTRAVENOUS
  Filled 2016-08-29: qty 1

## 2016-08-29 MED ORDER — SODIUM CHLORIDE 0.9 % IV BOLUS (SEPSIS)
1000.0000 mL | Freq: Once | INTRAVENOUS | Status: AC
  Administered 2016-08-29: 1000 mL via INTRAVENOUS

## 2016-08-29 MED ORDER — PROPRANOLOL HCL 1 MG/ML IV SOLN
1.0000 mg | Freq: Once | INTRAVENOUS | Status: AC
Start: 2016-08-29 — End: 2016-08-29
  Administered 2016-08-29: 1 mg via INTRAVENOUS
  Filled 2016-08-29: qty 1

## 2016-08-29 NOTE — ED Notes (Signed)
Medications requested by pharmacy.

## 2016-08-29 NOTE — ED Notes (Signed)
Requested medications via telephone and inquired about delay.

## 2016-08-29 NOTE — ED Provider Notes (Signed)
[facility]-EMERGENCY DEPT Provider Note   CSN: [id number] Arrival date & time: 08/29/16  9675  By signing my name below, I, Doreatha Martin, attest that this documentation has been prepared under the direction and in the presence of Raeford Razor, MD. Electronically Signed: Doreatha Martin, ED Scribe. 08/29/16. 10:10 AM.     History   Chief Complaint Chief Complaint  Patient presents with  . Chest Pain    HPI Tara [name] is a 25 y.o. female with h/o HTN, hyperthyroidism who presents to the Emergency Department complaining of moderate, substernal, constant CP onset one hour ago while ambulating with associated SOB and lightheadedness. She describes her pain as "pinching" and a tightness. Pt states her pain is worsened with ambulation. Pt states she woke up at 6 this morning and was feeling otherwise well before the onset of her symptoms.  Pt reports her current symptoms feel similar to prior flare-ups of hyperthyroidism. No h/o PE/DVT. She denies cough, unusual swelling.   The history is provided by the patient. No language interpreter was used.    Past Medical History:  Diagnosis Date  . Anemia   . CAP (community acquired pneumonia) 02/13/2016  . Chronic bronchitis (HCC)   . Graves disease   . Graves disease   . Headache   . Hypertension   . Obesity   . Pneumonia "several times"    Patient Active Problem List   Diagnosis Date Noted  . Chronic hypertension with superimposed preeclampsia 07/21/2016  . IUGR, antenatal 07/07/2016  . Tachycardia   . HCAP (healthcare-associated pneumonia) 04/13/2016  . Hyperthyroidism in pregnancy, antepartum 04/13/2016  . Noncompliance   . Abnormal maternal serum screening test 03/30/2016  . Anemia of mother in pregnancy, antepartum 03/01/2016  . Supervision of high risk pregnancy, antepartum 02/26/2016  . Chronic hypertension during pregnancy, antepartum 02/26/2016  . Previous cesarean delivery affecting pregnancy, antepartum 02/26/2016  . Graves  disease 02/11/2016  . Acute respiratory failure with hypoxia (HCC) 02/11/2016  . Morbid obesity due to excess calories [facility])     Past Surgical History:  Procedure Laterality Date  . CESAREAN SECTION    . CESAREAN SECTION N/A 07/21/2016   Procedure: CESAREAN SECTION;  Surgeon: Lesly Dukes, MD;  Location: [facility] BIRTHING SUITES;  Service: Obstetrics;  Laterality: N/A;  . EYE SURGERY Bilateral 2014   "for Graves Disease; eyelid retraction on left; decompression on the right""    OB History    Gravida Para Term Preterm AB Living   5 5 4 1  0 5   SAB TAB Ectopic Multiple Live Births   0 0 0 0 4       Home Medications    Prior to Admission medications   Medication Sig Start Date End Date Taking? Authorizing Provider  amoxicillin-clavulanate (AUGMENTIN) 875-125 MG tablet Take 1 tablet by mouth 2 (two) times daily. Patient not taking: Reported on 08/25/2016 07/24/16   Lorne Skeens, MD  butalbital-acetaminophen-caffeine (FIORICET) [id number] MG tablet Take 1-2 tablets by mouth every 6 (six) hours as needed for headache. Patient not taking: Reported on 08/25/2016 07/26/16   Dorathy Kinsman, CNM  hydrochlorothiazide (HYDRODIURIL) 25 MG tablet Take 1 tablet (25 mg total) by mouth daily. 07/26/16   Dorathy Kinsman, CNM  HYDROcodone-homatropine (HYCODAN) 5-1.5 MG/5ML syrup Take 5 mLs by mouth every 4 (four) hours as needed for cough. Patient not taking: Reported on 08/25/2016 07/26/16   Dorathy Kinsman, CNM  ibuprofen (ADVIL,MOTRIN) 600 MG tablet Take 1 tablet (600 mg total) by mouth every  6 (six) hours as needed for moderate pain. Patient not taking: Reported on 08/25/2016 07/26/16   Dorathy Kinsman, CNM  labetalol (NORMODYNE) 300 MG tablet Take 1 tablet (300 mg total) by mouth 2 (two) times daily. 07/24/16   Lorne Skeens, MD  methimazole (TAPAZOLE) 5 MG tablet Take 3 tablets (15 mg total) by mouth 2 (two) times daily. Patient not taking: Reported on 08/25/2016 07/24/16   Lorne Skeens, MD    oxyCODONE (OXY IR/ROXICODONE) 5 MG immediate release tablet Take 1 tablet (5 mg total) by mouth every 4 (four) hours as needed (pain scale 4-7). Patient not taking: Reported on 08/25/2016 07/24/16   Lorne Skeens, MD  Prenatal Vit-Fe Fumarate-FA (PRENATAL MULTIVITAMIN) TABS tablet Take 1 tablet by mouth daily at 12 noon. 04/21/16   Valentino Nose, MD    Family History No family history on file.  Social History Social History  Substance Use Topics  . Smoking status: Never Smoker  . Smokeless tobacco: Never Used  . Alcohol use No     Allergies   Iodine and Shellfish allergy   Review of Systems Review of Systems  Respiratory: Positive for shortness of breath. Negative for cough.   Cardiovascular: Positive for chest pain. Negative for leg swelling.  Neurological: Positive for light-headedness.  All other systems reviewed and are negative.    Physical Exam Updated Vital Signs SpO2 97%   Physical Exam  Constitutional: She appears well-developed and well-nourished. No distress.  HENT:  Head: Normocephalic and atraumatic.  Mouth/Throat: Oropharynx is clear and moist. No oropharyngeal exudate.  Eyes: Conjunctivae and EOM are normal. Pupils are equal, round, and reactive to light. Right eye exhibits no discharge. Left eye exhibits no discharge. No scleral icterus.  Neck: Normal range of motion. Neck supple. No JVD present. No thyromegaly present.  Cardiovascular: Regular rhythm, normal heart sounds and intact distal pulses.  Tachycardia present.  Exam reveals no gallop and no friction rub.   No murmur heard. Tachycardic   Pulmonary/Chest: Effort normal and breath sounds normal. No respiratory distress. She has no wheezes. She has no rales. She exhibits no tenderness.  No chest wall tenderness  Abdominal: Soft. Bowel sounds are normal. She exhibits no distension and no mass. There is no tenderness.  Musculoskeletal: Normal range of motion. She exhibits no edema or  tenderness.  Lymphadenopathy:    She has no cervical adenopathy.  Neurological: She is alert. Coordination normal.  Skin: Skin is warm and dry. No rash noted. No erythema.  Psychiatric: She has a normal mood and affect. Her behavior is normal.  Nursing note and vitals reviewed.    ED Treatments / Results   DIAGNOSTIC STUDIES: Oxygen Saturation is 97% on RA, normal by my interpretation.    COORDINATION OF CARE: 10:09 AM Discussed treatment plan with pt at bedside which includes lab work, CXR and pt agreed to plan.    Labs (all labs ordered are listed, but only abnormal results are displayed) Labs Reviewed  TSH - Abnormal; Notable for the following:       Result Value   TSH 0.028 (*)    All other components within normal limits  BASIC METABOLIC PANEL - Abnormal; Notable for the following:    Sodium 134 (*)    CO2 18 (*)    Glucose, Bld 139 (*)    BUN 33 (*)    All other components within normal limits  CBC WITH DIFFERENTIAL/PLATELET - Abnormal; Notable for the following:    RBC 6.47 (*)  HCT 46.2 (*)    MCV 71.4 (*)    MCH 22.4 (*)    All other components within normal limits  T4, FREE - Abnormal; Notable for the following:    Free T4 4.98 (*)    All other components within normal limits  T3, FREE - Abnormal; Notable for the following:    T3, Free 24.2 (*)    All other components within normal limits  MAGNESIUM  TROPONIN I  BRAIN NATRIURETIC PEPTIDE  I-STAT BETA HCG BLOOD, ED ([facility], [facility], [facility] ONLY)    EKG  EKG Interpretation  Date/Time:  Sunday August 29 2016 10:00:37 EDT Ventricular Rate:  105 PR Interval:    QRS Duration: 91 QT Interval:  348 QTC Calculation: 460 R Axis:   -69 Text Interpretation:  Sinus tachycardia LAD, consider left anterior fascicular block STE anteriorly diffuse  Non-specific ST-t changes tion, anterolateral leads Confirmed by Juleen ChinaKOHUT  MD, Wyoma Genson ([id number]) on 08/29/2016 10:18:25 AM       Radiology No results  found.  Procedures Procedures (including critical care time)  Medications Ordered in ED Medications - No data to display   Initial Impression / Assessment and Plan / ED Course  I have reviewed the triage vital signs and the nursing notes.  Pertinent labs & imaging results that were available during my care of the patient were reviewed by me and considered in my medical decision making (see chart for details).  Clinical Course    25 year old female with chest pain. Seems atypical for ACS. EKG is nonischemic but she is tachycardic. She reports similar type symptoms previously with thyroid storm. She reports compliance with her medications. Labs from less than 2 months ago show significantly elevated free T3/T4 and decreased TSH. Repeat TSH one month ago was even lower.   I question compliance. She tells me she is taking her medications but apparently on medication review on 9/6 she reported that she wasn't.  She was given steroids, beta blocker and methimazole in ED. Stressed the importance of medication compliance. Her symptoms are atypical for ACS. Doubt PE, dissection or other emergent process. I think she can be discharged. She needs to follow-up with endocrinology.    Final Clinical Impressions(s) / ED Diagnoses   Final diagnoses:  Chest pain, unspecified chest pain type  Hyperthyroidism    New Prescriptions New Prescriptions   No medications on file    I personally preformed the services scribed in my presence. The recorded information has been reviewed is accurate. Raeford RazorStephen Neylan Koroma, MD.    Raeford RazorStephen Sharonann Malbrough, MD 08/31/16 [phone]

## 2016-08-29 NOTE — ED Triage Notes (Signed)
Pt presents with c/o CP and SOB that began this morning while walking and was accompanied by diaphoresis and nausea.  Pt is 5 weeks postpartum and had preeclampsia prior to delivery.  She denies vision changes, abdominal pain, vaginal bleeding, or edema.

## 2016-08-29 NOTE — ED Notes (Signed)
Pt comfortable with discharge and follow up instructions. Pt declines wheelchair, escorted to waiting area by this RN. Rx x2 

## 2016-08-30 LAB — T3, FREE: T3, Free: 24.2 pg/mL — ABNORMAL HIGH (ref 2.0–4.4)

## 2016-09-21 ENCOUNTER — Ambulatory Visit: Payer: Medicaid Other | Admitting: Obstetrics and Gynecology

## 2016-10-11 ENCOUNTER — Encounter (HOSPITAL_COMMUNITY): Payer: Self-pay | Admitting: Emergency Medicine

## 2016-10-11 ENCOUNTER — Emergency Department (HOSPITAL_COMMUNITY)
Admission: EM | Admit: 2016-10-11 | Discharge: 2016-10-11 | Disposition: A | Discharge status: Home / Self Care | Payer: Medicaid Other | Attending: Emergency Medicine | Admitting: Emergency Medicine

## 2016-10-11 DIAGNOSIS — I252 Old myocardial infarction: Secondary | ICD-10-CM | POA: Diagnosis not present

## 2016-10-11 DIAGNOSIS — K047 Periapical abscess without sinus: Secondary | ICD-10-CM | POA: Diagnosis not present

## 2016-10-11 DIAGNOSIS — K029 Dental caries, unspecified: Secondary | ICD-10-CM

## 2016-10-11 DIAGNOSIS — I10 Essential (primary) hypertension: Secondary | ICD-10-CM | POA: Insufficient documentation

## 2016-10-11 DIAGNOSIS — K0889 Other specified disorders of teeth and supporting structures: Secondary | ICD-10-CM | POA: Diagnosis present

## 2016-10-11 MED ORDER — PENICILLIN V POTASSIUM 500 MG PO TABS: 1000.0000 mg | ORAL_TABLET | Freq: Two times a day (BID) | ORAL | 0 refills | Status: DC

## 2016-10-11 NOTE — ED Provider Notes (Signed)
[facility] DEPT Provider Note   CSN: [id number] Arrival date & time: 10/11/16  0745     History   Chief Complaint Chief Complaint  Patient presents with  . Dental Pain    HPI Tara [name] is a 25 y.o. female with a PMHx of anemia, Graves disease, HTN, remote MI, and headaches, who presents to the ED with complaints of left lower molar pain 2 days. Patient states that she has a tooth that she thinks needs to be pulled, this tooth has caused her issues before, but she does not currently have a dentist and she is working with her caseworker to get a referral to a dentist. She describes the pain today is 7/10 constant throbbing nonradiating left lower molar pain, worse with air passage and cold liquids, and unrelieved with over-the-counter mouthwash. Associated symptoms include gum swelling. She denies any gum drainage, fevers or chills, drooling, trismus, face or neck swelling, ear pain or drainage, chest pain, shortness breath, abdominal pain, nausea, vomiting, diarrhea, constipation, dysuria, hematuria, numbness, tingling, or focal weakness. Denies being a smoker.   The history is provided by the patient and medical records. No language interpreter was used.  Dental Pain   This is a recurrent problem. The current episode started 2 days ago. The problem occurs constantly. The problem has not changed since onset.The pain is at a severity of 7/10. The pain is moderate. Treatments tried: mouth wash. The treatment provided no relief.    Past Medical History:  Diagnosis Date  . Anemia   . CAP (community acquired pneumonia) 02/13/2016  . Chronic bronchitis (HCC)   . Graves disease   . Graves disease   . Headache   . Hypertension   . Myocardial infarction 2013  . Obesity   . Pneumonia "several times"    Patient Active Problem List   Diagnosis Date Noted  . Chronic hypertension with superimposed preeclampsia 07/21/2016  . IUGR, antenatal 07/07/2016  . Tachycardia   . HCAP  (healthcare-associated pneumonia) 04/13/2016  . Hyperthyroidism in pregnancy, antepartum 04/13/2016  . Noncompliance   . Abnormal maternal serum screening test 03/30/2016  . Anemia of mother in pregnancy, antepartum 03/01/2016  . Supervision of high risk pregnancy, antepartum 02/26/2016  . Chronic hypertension during pregnancy, antepartum 02/26/2016  . Previous cesarean delivery affecting pregnancy, antepartum 02/26/2016  . Graves disease 02/11/2016  . Acute respiratory failure with hypoxia (HCC) 02/11/2016  . Morbid obesity due to excess calories [facility])     Past Surgical History:  Procedure Laterality Date  . CESAREAN SECTION    . CESAREAN SECTION N/A 07/21/2016   Procedure: CESAREAN SECTION;  Surgeon: Lesly Dukes, MD;  Location: [facility];  Service: Obstetrics;  Laterality: N/A;  . EYE SURGERY Bilateral 2014   "for Graves Disease; eyelid retraction on left; decompression on the right""    OB History    Gravida Para Term Preterm AB Living   5 5 4 1  0 5   SAB TAB Ectopic Multiple Live Births   0 0 0 0 4       Home Medications    Prior to Admission medications   Medication Sig Start Date End Date Taking? Authorizing Provider  amoxicillin-clavulanate (AUGMENTIN) 875-125 MG tablet Take 1 tablet by mouth 2 (two) times daily. Patient not taking: Reported on 08/25/2016 07/24/16   Lorne Skeens, MD  butalbital-acetaminophen-caffeine (FIORICET) [phone] MG tablet Take 1-2 tablets by mouth every 6 (six) hours as needed for headache. Patient not taking: Reported on  08/25/2016 07/26/16   Dorathy KinsmanVirginia Smith, CNM  hydrochlorothiazide (HYDRODIURIL) 25 MG tablet Take 1 tablet (25 mg total) by mouth daily. 07/26/16   Dorathy KinsmanVirginia Smith, CNM  HYDROcodone-homatropine (HYCODAN) 5-1.5 MG/5ML syrup Take 5 mLs by mouth every 4 (four) hours as needed for cough. Patient not taking: Reported on 08/25/2016 07/26/16   Dorathy KinsmanVirginia Smith, CNM  ibuprofen (ADVIL,MOTRIN) 600 MG tablet Take 1 tablet (600 mg  total) by mouth every 6 (six) hours as needed for moderate pain. Patient not taking: Reported on 08/25/2016 07/26/16   Dorathy KinsmanVirginia Smith, CNM  labetalol (NORMODYNE) 300 MG tablet Take 1 tablet (300 mg total) by mouth 2 (two) times daily. 08/29/16   Raeford RazorStephen Kohut, MD  methimazole (TAPAZOLE) 5 MG tablet Take 3 tablets (15 mg total) by mouth 2 (two) times daily. 08/29/16   Raeford RazorStephen Kohut, MD  oxyCODONE (OXY IR/ROXICODONE) 5 MG immediate release tablet Take 1 tablet (5 mg total) by mouth every 4 (four) hours as needed (pain scale 4-7). Patient not taking: Reported on 08/25/2016 07/24/16   Lorne SkeensNicholas Michael Schenk, MD  Prenatal Vit-Fe Fumarate-FA (PRENATAL MULTIVITAMIN) TABS tablet Take 1 tablet by mouth daily at 12 noon. 04/21/16   Valentino NoseNathan Boswell, MD    Family History No family history on file.  Social History Social History  Substance Use Topics  . Smoking status: Never Smoker  . Smokeless tobacco: Never Used  . Alcohol use No     Allergies   Iodine and Shellfish allergy   Review of Systems Review of Systems  Constitutional: Negative for chills and fever.  HENT: Positive for dental problem. Negative for drooling, ear discharge, ear pain, facial swelling and trouble swallowing.   Respiratory: Negative for shortness of breath.   Cardiovascular: Negative for chest pain.  Gastrointestinal: Negative for abdominal pain, constipation, diarrhea, nausea and vomiting.  Genitourinary: Negative for dysuria and hematuria.  Musculoskeletal: Negative for arthralgias, myalgias and neck pain.  Skin: Negative for color change.  Allergic/Immunologic: Negative for immunocompromised state.  Neurological: Negative for weakness and numbness.  Psychiatric/Behavioral: Negative for confusion.   10 Systems reviewed and are negative for acute change except as noted in the HPI.   Physical Exam Updated Vital Signs BP 139/71 (BP Location: Left Arm)   Pulse 72   Temp 98 F (36.7 C) (Oral)   Resp 20   SpO2 100%    Physical Exam  Constitutional: She is oriented to person, place, and time. Vital signs are normal. She appears well-developed and well-nourished.  Non-toxic appearance. No distress.  Afebrile, nontoxic, NAD  HENT:  Head: Normocephalic and atraumatic.  Nose: Nose normal.  Mouth/Throat: Uvula is midline, oropharynx is clear and moist and mucous membranes are normal. No trismus in the jaw. Dental caries present. No dental abscesses or uvula swelling. Tonsils are 0 on the right. Tonsils are 0 on the left. No tonsillar exudate.    L lower molar #17 slightly decayed with possible caries, mild surrounding gingival erythema and swelling, no definite abscess, no evidence of ludwig's. Nose clear. Oropharynx clear and moist, without uvular swelling or deviation, no trismus or drooling, no tonsillar swelling or erythema, no exudates.    Eyes: Conjunctivae and EOM are normal. Right eye exhibits no discharge. Left eye exhibits no discharge.  Neck: Normal range of motion. Neck supple.  Cardiovascular: Normal rate and intact distal pulses.   Pulmonary/Chest: Effort normal. No respiratory distress.  Abdominal: Normal appearance. She exhibits no distension.  Musculoskeletal: Normal range of motion.  Neurological: She is alert and oriented  to person, place, and time. She has normal strength. No sensory deficit.  Skin: Skin is warm, dry and intact. No rash noted.  Psychiatric: She has a normal mood and affect. Her behavior is normal.  Nursing note and vitals reviewed.    ED Treatments / Results  Labs (all labs ordered are listed, but only abnormal results are displayed) Labs Reviewed - No data to display  EKG  EKG Interpretation None       Radiology No results found.  Procedures Procedures (including critical care time)  Medications Ordered in ED Medications - No data to display   Initial Impression / Assessment and Plan / ED Course  I have reviewed the triage vital signs and the  nursing notes.  Pertinent labs & imaging results that were available during my care of the patient were reviewed by me and considered in my medical decision making (see chart for details).  Clinical Course    25 y.o. female here with Dental pain associated with dental decay and possible dental infection, but no evidence of abscess, with patient afebrile, non toxic appearing and swallowing secretions well, no evidence of ludwig's. I gave patient referral to dentist and stressed the importance of dental follow up for ultimate management of dental pain.  I have also discussed reasons to return immediately to the ER.  Patient expresses understanding and agrees with plan.  I will also give PCN VK.  Final Clinical Impressions(s) / ED Diagnoses   Final diagnoses:  Pain due to dental caries  Dental infection    New Prescriptions New Prescriptions   PENICILLIN V POTASSIUM (VEETID) 500 MG TABLET    Take 2 tablets (1,000 mg total) by mouth 2 (two) times daily. X 7 days     Allen Derry, PA-C 10/11/16 0805    Laurence Spates, MD 10/13/16 1600

## 2016-10-11 NOTE — ED Triage Notes (Signed)
Left tooth pain states she knows she needs to have it pulled, hurts when cold  Air hits it

## 2016-10-11 NOTE — Discharge Instructions (Signed)
Apply warm compresses to jaw throughout the day. Take antibiotic until finished. Use tylenol or motrin as needed for pain. Perform salt water swishes to help with pain/swelling. Use over the counter oragel as needed for additional relief. Followup with a dentist is very important for ongoing evaluation and management of recurrent dental pain, call the dentist listed above in the next 24-48 hours to schedule ongoing dental care, or use the list below to find a dentist. Return to emergency department for emergent changing or worsening symptoms.    Emergency Department Resource Guide 1) Find a Doctor and Pay Out of Pocket Although you won't have to find out who is covered by your insurance plan, it is a good idea to ask around and get recommendations. You will then need to call the office and see if the doctor you have chosen will accept you as a new patient and what types of options they offer for patients who are self-pay. Some doctors offer discounts or will set up payment plans for their patients who do not have insurance, but you will need to ask so you aren't surprised when you get to your appointment.  2) Contact Your Local Health Department Not all health departments have doctors that can see patients for sick visits, but many do, so it is worth a call to see if yours does. If you don't know where your local health department is, you can check in your phone book. The CDC also has a tool to help you locate your state's health department, and many state websites also have listings of all of their local health departments.  3) Find a Walk-in Clinic If your illness is not likely to be very severe or complicated, you may want to try a walk in clinic. These are popping up all over the country in pharmacies, drugstores, and shopping centers. They're usually staffed by nurse practitioners or physician assistants that have been trained to treat common illnesses and complaints. They're usually fairly quick  and inexpensive. However, if you have serious medical issues or chronic medical problems, these are probably not your best option.  No Primary Care Doctor: Call [facility] at  [phone] - they can help you locate a primary care doctor that  accepts your insurance, provides certain services, etc. Physician Referral Service- [phone]  Chronic Pain Problems: Organization         Address  Phone   Notes  [facility]  [phone] Patients need to be referred by their primary care doctor.   Medication Assistance: Organization         Address  Phone   Notes  [facility] Medication [facility] [address] [place] [place], [place] [address] [phone] --Must be a resident of [facility] -- Must have NO insurance coverage whatsoever (no Medicaid/ Medicare, etc.) -- The pt. MUST have a primary care doctor that directs their care regularly and follows them in the community   [facility]  [phone]   [place]  [phone]     Dental Care: Organization         Address  Phone  Notes  [facility] [address] [place], [place] [phone] Accepts children up to age 24 who are enrolled in [place] or [place] Health Choice; pregnant women with a Medicaid card; and children who have applied for Medicaid or [place] Health Choice, but were declined, whose parents can pay a reduced fee  at time of service.  °[facility] [place]  [address], [place] [phone] Accepts children up to age 21 who are enrolled in Medicaid or [place] Health Choice; pregnant women with a Medicaid card; and children who have applied for Medicaid or [place] Health Choice, but were declined, whose parents can pay a reduced fee at time of service.  °[facility] ° [address], [place] [phone] Patients are seen by appointment only. Walk-ins are not  accepted. [facility] will see patients 18 years of age and older. °Monday - Tuesday (8am-5pm) °Most Wednesdays (8:30-5pm) °$30 per visit, cash only  °[facility] ° [address], [place] [phone] Patients are seen by appointment only. Walk-ins are not accepted. [facility] will see patients 18 years of age and older. °One Wednesday Evening (Monthly: Volunteer Based).  $30 per visit, cash only  °[facility]  [phone] for adults; Children under age 4, call Graduate Pediatric Dentistry at [phone]. Children aged 4-14, please call [phone] to request a pediatric application. ° Dental services are provided in all areas of dental care including fillings, crowns and bridges, complete and partial dentures, implants, gum treatment, root canals, and extractions. Preventive care is also provided. Treatment is provided to both adults and children. °Patients are selected via a lottery and there is often a waiting list. °  °[facility] [address], ° ° [phone] [email or web address] °  °[facility] [address], [place], [place] [phone], Ext. [phone] Second and Fourth Thursday of each month, opens at 6:30 AM; Clinic ends at 9 AM.  Patients are seen on a first-come first-served basis, and a limited number are seen during each clinic.  ° °[facility] ° [address], [place], [place] [phone]   Eligibility Requirements °You must have lived in [place], [place], or [place] counties for at least the last three months. °  You cannot be eligible for state or federal sponsored healthcare insurance, including Veterans Administration, Medicaid, or Medicare. °  You generally cannot be eligible for healthcare insurance through your employer.  °  How to apply: °Eligibility screenings are held every Tuesday and Wednesday afternoon from 1:00 pm until 4:00 pm. You do not need an appointment for the  interview!  °[facility] [address], [place],  [phone]   °[facility]  [phone]   °[facility]  [phone]   °[facility]  [phone]   ° ° °  °

## 2016-10-11 NOTE — ED Notes (Signed)
Declined W/C at D/C and was escorted to lobby by RN. 

## 2016-10-24 ENCOUNTER — Emergency Department (HOSPITAL_COMMUNITY)
Admission: EM | Admit: 2016-10-24 | Discharge: 2016-10-24 | Disposition: A | Discharge status: Home / Self Care | Payer: Medicaid Other | Source: Home / Self Care | Attending: Emergency Medicine | Admitting: Emergency Medicine

## 2016-10-24 ENCOUNTER — Emergency Department (HOSPITAL_COMMUNITY): Payer: Medicaid Other

## 2016-10-24 ENCOUNTER — Encounter (HOSPITAL_COMMUNITY): Payer: Self-pay

## 2016-10-24 DIAGNOSIS — I10 Essential (primary) hypertension: Secondary | ICD-10-CM

## 2016-10-24 DIAGNOSIS — R000 Tachycardia, unspecified: Secondary | ICD-10-CM | POA: Insufficient documentation

## 2016-10-24 DIAGNOSIS — J189 Pneumonia, unspecified organism: Secondary | ICD-10-CM

## 2016-10-24 DIAGNOSIS — R0602 Shortness of breath: Secondary | ICD-10-CM | POA: Insufficient documentation

## 2016-10-24 DIAGNOSIS — I252 Old myocardial infarction: Secondary | ICD-10-CM | POA: Insufficient documentation

## 2016-10-24 LAB — COMPREHENSIVE METABOLIC PANEL
ALBUMIN: 3.3 g/dL — AB (ref 3.5–5.0)
ALK PHOS: 121 U/L (ref 38–126)
ALT: 22 U/L (ref 14–54)
ANION GAP: 7 (ref 5–15)
AST: 26 U/L (ref 15–41)
BUN: <5 mg/dL — ABNORMAL LOW (ref 6–20)
CALCIUM: 9.3 mg/dL (ref 8.9–10.3)
CHLORIDE: 108 mmol/L (ref 101–111)
CO2: 26 mmol/L (ref 22–32)
Creatinine, Ser: 0.56 mg/dL (ref 0.44–1.00)
GFR calc Af Amer: >60 mL/min (ref >60)
GFR calc non Af Amer: >60 mL/min (ref >60)
GLUCOSE: 105 mg/dL — AB (ref 65–99)
Potassium: 3.4 mmol/L — ABNORMAL LOW (ref 3.5–5.1)
SODIUM: 141 mmol/L (ref 135–145)
Total Bilirubin: 0.9 mg/dL (ref 0.3–1.2)
Total Protein: 6.6 g/dL (ref 6.5–8.1)

## 2016-10-24 LAB — I-STAT BETA HCG BLOOD, ED (MC, WL, AP ONLY): I-stat hCG, quantitative: <5 m[IU]/mL (ref <5)

## 2016-10-24 LAB — URINALYSIS, ROUTINE W REFLEX MICROSCOPIC
Bilirubin Urine: NEGATIVE
GLUCOSE, UA: NEGATIVE mg/dL
KETONES UR: NEGATIVE mg/dL
LEUKOCYTES UA: NEGATIVE
NITRITE: NEGATIVE
PROTEIN: NEGATIVE mg/dL
Specific Gravity, Urine: 1.007 (ref 1.005–1.030)
pH: 5 (ref 5.0–8.0)

## 2016-10-24 LAB — URINE MICROSCOPIC-ADD ON
BACTERIA UA: NONE SEEN
WBC UA: NONE SEEN WBC/hpf (ref 0–5)

## 2016-10-24 LAB — CBC WITH DIFFERENTIAL/PLATELET
BASOS ABS: 0 10*3/uL (ref 0.0–0.1)
Basophils Relative: 0 %
EOS ABS: 0.2 10*3/uL (ref 0.0–0.7)
Eosinophils Relative: 2 %
HCT: 34.5 % — ABNORMAL LOW (ref 36.0–46.0)
HEMOGLOBIN: 10.9 g/dL — AB (ref 12.0–15.0)
LYMPHS PCT: 11 %
Lymphs Abs: 1.1 10*3/uL (ref 0.7–4.0)
MCH: 21.8 pg — ABNORMAL LOW (ref 26.0–34.0)
MCHC: 31.6 g/dL (ref 30.0–36.0)
MCV: 69.1 fL — ABNORMAL LOW (ref 78.0–100.0)
MONO ABS: 0.8 10*3/uL (ref 0.1–1.0)
Monocytes Relative: 8 %
NEUTROS ABS: 8.2 10*3/uL — AB (ref 1.7–7.7)
NEUTROS PCT: 79 %
PLATELETS: 251 10*3/uL (ref 150–400)
RBC: 4.99 MIL/uL (ref 3.87–5.11)
RDW: 14.9 % (ref 11.5–15.5)
WBC: 10.3 10*3/uL (ref 4.0–10.5)

## 2016-10-24 LAB — I-STAT CG4 LACTIC ACID, ED: LACTIC ACID, VENOUS: 0.96 mmol/L (ref 0.5–1.9)

## 2016-10-24 LAB — BRAIN NATRIURETIC PEPTIDE: B Natriuretic Peptide: 205.8 pg/mL — ABNORMAL HIGH (ref 0.0–100.0)

## 2016-10-24 MED ORDER — LABETALOL HCL 300 MG PO TABS: 300.0000 mg | ORAL_TABLET | Freq: Two times a day (BID) | ORAL | 0 refills | Status: DC

## 2016-10-24 MED ORDER — DEXTROSE 5 % IV SOLN: 500.0000 mg | Freq: Once | INTRAVENOUS | Status: DC

## 2016-10-24 MED ORDER — DEXTROSE 5 % IV SOLN
1.0000 g | Freq: Once | INTRAVENOUS | Status: AC
  Administered 2016-10-24: 1 g via INTRAVENOUS
  Filled 2016-10-24: qty 10

## 2016-10-24 MED ORDER — IPRATROPIUM-ALBUTEROL 0.5-2.5 (3) MG/3ML IN SOLN
3.0000 mL | Freq: Once | RESPIRATORY_TRACT | Status: AC
  Administered 2016-10-24: 3 mL via RESPIRATORY_TRACT
  Filled 2016-10-24: qty 3

## 2016-10-24 MED ORDER — SODIUM CHLORIDE 0.9 % IV BOLUS (SEPSIS)
1000.0000 mL | Freq: Once | INTRAVENOUS | Status: AC
  Administered 2016-10-24: 1000 mL via INTRAVENOUS

## 2016-10-24 MED ORDER — AZITHROMYCIN 250 MG PO TABS: ORAL_TABLET | ORAL | 0 refills | Status: DC

## 2016-10-24 MED ORDER — AZITHROMYCIN 250 MG PO TABS
500.0000 mg | ORAL_TABLET | Freq: Once | ORAL | Status: AC
  Administered 2016-10-24: 500 mg via ORAL
  Filled 2016-10-24: qty 2

## 2016-10-24 MED ORDER — METHIMAZOLE 5 MG PO TABS: 15.0000 mg | ORAL_TABLET | Freq: Two times a day (BID) | ORAL | 1 refills | Status: DC

## 2016-10-24 NOTE — ED Notes (Signed)
Pt states she understands instructions and will follow up or retuyrn. Home stable with [facility] advocate. Steady gait. No shob noted.

## 2016-10-24 NOTE — Discharge Instructions (Signed)
Take tylenol and/or motrin every 6 hrs for fever. Take zithromax as prescribed until all gone, next dose tomorrow. Take your regular medications. Follow up with family doctor. You should have a primary care doctor assigned to you through medicaid, call the number on your medicaid card. Return if worsening symptoms.

## 2016-10-24 NOTE — ED Provider Notes (Signed)
[facility]-EMERGENCY DEPT Provider Note   CSN: [id number] Arrival date & time: 10/24/16  1214     History   Chief Complaint Chief Complaint  Patient presents with  . Fever  . Tachycardia    HPI Tara [name] is a 25 y.o. female.  HPI Tara [name] is a 25 y.o. female with history of Graves' disease, hypertension, self-reported MI in 2013, pneumonia, presents to emergency department with complaint of fever, chills, chest pain, shortness of breath, cough and congestion. Patient states her symptoms started 2 days ago. She reports shortness of breath that is worse on exertion when laying down flat. She reports history of pneumonia states feels the same. She states that she also was diagnosed with "fluid on her lungs" when she was pregnant, 4 months ago. She has never followed up since then. She reports chronic swelling in her legs. She states that she has Graves' disease but has been off of her medications for 2 weeks because she does not have a primary care doctor and ran out of medications. She still takes her metoprolol but states she only has few pills left. She has not taken anything for her fever at home. She has been taking over-the-counter cough medication to help with cough. She denies any nausea, vomiting, abdominal pain, urinary symptoms, vaginal discharge or bleeding. Denies any back or flank  pain.  Past Medical History:  Diagnosis Date  . Anemia   . CAP (community acquired pneumonia) 02/13/2016  . Chronic bronchitis (HCC)   . Graves disease   . Graves disease   . Headache   . Hypertension   . Myocardial infarction 2013  . Obesity   . Pneumonia "several times"    Patient Active Problem List   Diagnosis Date Noted  . Chronic hypertension with superimposed preeclampsia 07/21/2016  . IUGR, antenatal 07/07/2016  . Tachycardia   . HCAP (healthcare-associated pneumonia) 04/13/2016  . Hyperthyroidism in pregnancy, antepartum 04/13/2016  . Noncompliance   . Abnormal  maternal serum screening test 03/30/2016  . Anemia of mother in pregnancy, antepartum 03/01/2016  . Supervision of high risk pregnancy, antepartum 02/26/2016  . Chronic hypertension during pregnancy, antepartum 02/26/2016  . Previous cesarean delivery affecting pregnancy, antepartum 02/26/2016  . Graves disease 02/11/2016  . Acute respiratory failure with hypoxia (HCC) 02/11/2016  . Morbid obesity due to excess calories [facility])     Past Surgical History:  Procedure Laterality Date  . CESAREAN SECTION    . CESAREAN SECTION N/A 07/21/2016   Procedure: CESAREAN SECTION;  Surgeon: Lesly DukesKelly H Leggett, MD;  [facility];  Service: Obstetrics;  Laterality: N/A;  . EYE SURGERY Bilateral 2014   "for Graves Disease; eyelid retraction on left; decompression on the right""    OB History    Gravida Para Term Preterm AB Living   5 5 4 1  0 5   SAB TAB Ectopic Multiple Live Births   0 0 0 0 4       Home Medications    Prior to Admission medications   Medication Sig Start Date End Date Taking? Authorizing Provider  amoxicillin-clavulanate (AUGMENTIN) 875-125 MG tablet Take 1 tablet by mouth 2 (two) times daily. Patient not taking: Reported on 08/25/2016 07/24/16   Lorne SkeensNicholas Michael Schenk, MD  butalbital-acetaminophen-caffeine (FIORICET) [id number] MG tablet Take 1-2 tablets by mouth every 6 (six) hours as needed for headache. Patient not taking: Reported on 08/25/2016 07/26/16   Dorathy KinsmanVirginia Smith, CNM  hydrochlorothiazide (HYDRODIURIL) 25 MG tablet Take 1 tablet (25  mg total) by mouth daily. 07/26/16   Dorathy Kinsman, CNM  HYDROcodone-homatropine (HYCODAN) 5-1.5 MG/5ML syrup Take 5 mLs by mouth every 4 (four) hours as needed for cough. Patient not taking: Reported on 08/25/2016 07/26/16   Dorathy Kinsman, CNM  ibuprofen (ADVIL,MOTRIN) 600 MG tablet Take 1 tablet (600 mg total) by mouth every 6 (six) hours as needed for moderate pain. Patient not taking: Reported on 08/25/2016 07/26/16   Dorathy Kinsman, CNM    labetalol (NORMODYNE) 300 MG tablet Take 1 tablet (300 mg total) by mouth 2 (two) times daily. 08/29/16   Raeford Razor, MD  methimazole (TAPAZOLE) 5 MG tablet Take 3 tablets (15 mg total) by mouth 2 (two) times daily. 08/29/16   Raeford Razor, MD  oxyCODONE (OXY IR/ROXICODONE) 5 MG immediate release tablet Take 1 tablet (5 mg total) by mouth every 4 (four) hours as needed (pain scale 4-7). Patient not taking: Reported on 08/25/2016 07/24/16   Lorne Skeens, MD  penicillin v potassium (VEETID) 500 MG tablet Take 2 tablets (1,000 mg total) by mouth 2 (two) times daily. X 7 days 10/11/16   Mercedes Camprubi-Soms, PA-C  Prenatal Vit-Fe Fumarate-FA (PRENATAL MULTIVITAMIN) TABS tablet Take 1 tablet by mouth daily at 12 noon. 04/21/16   Valentino Nose, MD    Family History No family history on file.  Social History Social History  Substance Use Topics  . Smoking status: Never Smoker  . Smokeless tobacco: Never Used  . Alcohol use No     Allergies   Iodine and Shellfish allergy   Review of Systems Review of Systems  Constitutional: Positive for chills and fever.  HENT: Positive for congestion.   Respiratory: Positive for cough, chest tightness and shortness of breath.   Cardiovascular: Positive for chest pain. Negative for palpitations and leg swelling.  Gastrointestinal: Negative for abdominal distention, abdominal pain, diarrhea, nausea and vomiting.  Genitourinary: Negative for dysuria, flank pain, pelvic pain, vaginal bleeding, vaginal discharge and vaginal pain.  Musculoskeletal: Negative for arthralgias, myalgias, neck pain and neck stiffness.  Skin: Negative for rash.  Neurological: Positive for headaches. Negative for dizziness and weakness.  All other systems reviewed and are negative.    Physical Exam Updated Vital Signs Ht 5\' 6"  (1.676 m)   Wt 122.5 kg   LMP 10/24/2016   SpO2 96% Comment: ra  BMI 43.58 kg/m   Physical Exam  Constitutional: She is oriented to  person, place, and time. She appears well-developed and well-nourished. No distress.  HENT:  Head: Normocephalic.  Right Ear: Tympanic membrane, external ear and ear canal normal.  Left Ear: Tympanic membrane, external ear and ear canal normal.  Nose: Rhinorrhea present.  Mouth/Throat: Uvula is midline and oropharynx is clear and moist.  Eyes: Conjunctivae are normal.  Neck: Neck supple.  No meningismus  Cardiovascular: Normal rate, regular rhythm and normal heart sounds.   Pulmonary/Chest: Effort normal and breath sounds normal. No respiratory distress. She has no wheezes. She has no rales.  Abdominal: Soft. Bowel sounds are normal. She exhibits no distension. There is no tenderness. There is no rebound.  Musculoskeletal: She exhibits no edema.  Neurological: She is alert and oriented to person, place, and time.  Skin: Skin is warm and dry.  Psychiatric: She has a normal mood and affect. Her behavior is normal.  Nursing note and vitals reviewed.    ED Treatments / Results  Labs (all labs ordered are listed, but only abnormal results are displayed) Labs Reviewed  CBC WITH DIFFERENTIAL/PLATELET -  Abnormal; Notable for the following:       Result Value   Hemoglobin 10.9 (*)    HCT 34.5 (*)    MCV 69.1 (*)    MCH 21.8 (*)    Neutro Abs 8.2 (*)    All other components within normal limits  COMPREHENSIVE METABOLIC PANEL - Abnormal; Notable for the following:    Potassium 3.4 (*)    Glucose, Bld 105 (*)    BUN <5 (*)    Albumin 3.3 (*)    All other components within normal limits  URINALYSIS, ROUTINE W REFLEX MICROSCOPIC (NOT AT [facility]) - Abnormal; Notable for the following:    Hgb urine dipstick LARGE (*)    All other components within normal limits  URINE MICROSCOPIC-ADD ON - Abnormal; Notable for the following:    Squamous Epithelial / LPF 0-5 (*)    All other components within normal limits  BRAIN NATRIURETIC PEPTIDE - Abnormal; Notable for the following:    B Natriuretic  Peptide 205.8 (*)    All other components within normal limits  I-STAT BETA HCG BLOOD, ED ([facility], [facility], [facility] ONLY)  I-STAT CG4 LACTIC ACID, ED  I-STAT CG4 LACTIC ACID, ED    EKG  EKG Interpretation None       Radiology Dg Chest 2 View  Result Date: 10/24/2016 CLINICAL DATA:  Chest pain. Dry cough. Short of breath since yesterday. Morbid obesity. EXAM: CHEST  2 VIEW COMPARISON:  08/29/2016 FINDINGS: Midline trachea. Cardiomegaly, accentuated by a mildly low lung volumes on the frontal radiograph. No pleural effusion or pneumothorax. Right greater than left, lower lobe predominant airspace disease. IMPRESSION: Bilateral airspace opacities, favoring pneumonia. Pulmonary edema felt less likely, given absence of pleural fluid. Cardiomegaly. Electronically Signed   By: Jeronimo Greaves M.D.   On: 10/24/2016 13:54    Procedures Procedures (including critical care time)  Medications Ordered in ED Medications  azithromycin (ZITHROMAX) tablet 500 mg (not administered)  sodium chloride 0.9 % bolus 1,000 mL (0 mLs Intravenous Stopped 10/24/16 1406)  ipratropium-albuterol (DUONEB) 0.5-2.5 (3) MG/3ML nebulizer solution 3 mL (3 mLs Nebulization Given 10/24/16 1300)  cefTRIAXone (ROCEPHIN) 1 g in dextrose 5 % 50 mL IVPB (1 g Intravenous New Bag/Given 10/24/16 1422)     Initial Impression / Assessment and Plan / ED Course  I have reviewed the triage vital signs and the nursing notes.  Pertinent labs & imaging results that were available during my care of the patient were reviewed by me and considered in my medical decision making (see chart for details).  Clinical Course     Pt is tachycardic, 120s, febrile. Received 1g of tylenol by EMS and 500cc boluse of NS. Pt complaining of cough, chest pain, sob. Will get labs, CXR. Fluids ordered.   3:27 PM CXR showing pneumonia. HR improved, now around 100. Pt feeling better. Normal lactic and WBC. Doubt sepsis. Tachycardia most likely from uncontrolled graves  disease. Discussed possible admission. Pt wanted to try to go home. Given rocephin 1g IV in dept. PO zithromax 500mg . Home with refills on her regular medications. Encouraged outpatient follow up.   Vitals:   10/24/16 1300 10/24/16 1315 10/24/16 1408 10/24/16 1415  BP: (!) 162/147 140/90 149/76 139/63  Pulse: 111 114 110 103  Resp:   22   Temp:   98.2 F (36.8 C)   TempSrc:   Oral   SpO2: 96% 97% 96% 97%  Weight:      Height:  Final Clinical Impressions(s) / ED Diagnoses   Final diagnoses:  Community acquired pneumonia, unspecified laterality  Tachycardia    New Prescriptions New Prescriptions   AZITHROMYCIN (ZITHROMAX) 250 MG TABLET    Take 1 tab PO daily     Jaynie Crumble, PA-C 10/24/16 1530    Jacalyn Lefevre, MD 10/24/16 1605

## 2016-10-24 NOTE — ED Notes (Signed)
Pt finishes treatment and states feels better.

## 2016-10-24 NOTE — ED Triage Notes (Signed)
Pt arrives EMS from [facility] with c/o fever, tachycardia and tachypnea. Pt c/ogeneralized aching. States contact with someone with flu. Given zofran 4mg . tylenol1000mg  and 500cc fluid by EMS. Pt took furosemide at home that she had from previous pregnancy because she thought her [facility] was related to fluid. Pt voids to [facility] on arrival.

## 2016-10-25 ENCOUNTER — Emergency Department (HOSPITAL_COMMUNITY): Payer: Medicaid Other

## 2016-10-25 ENCOUNTER — Inpatient Hospital Stay (HOSPITAL_COMMUNITY): Payer: Medicaid Other

## 2016-10-25 ENCOUNTER — Encounter (HOSPITAL_COMMUNITY): Payer: Self-pay

## 2016-10-25 ENCOUNTER — Inpatient Hospital Stay (HOSPITAL_COMMUNITY)
Admission: EM | Admit: 2016-10-25 | Discharge: 2016-11-03 | DRG: 870 | Disposition: A | Discharge status: Home / Self Care | Payer: Medicaid Other | Attending: Internal Medicine | Admitting: Internal Medicine

## 2016-10-25 DIAGNOSIS — J810 Acute pulmonary edema: Secondary | ICD-10-CM

## 2016-10-25 DIAGNOSIS — Z452 Encounter for adjustment and management of vascular access device: Secondary | ICD-10-CM

## 2016-10-25 DIAGNOSIS — E876 Hypokalemia: Secondary | ICD-10-CM | POA: Diagnosis present

## 2016-10-25 DIAGNOSIS — E875 Hyperkalemia: Secondary | ICD-10-CM | POA: Diagnosis present

## 2016-10-25 DIAGNOSIS — I110 Hypertensive heart disease with heart failure: Secondary | ICD-10-CM | POA: Diagnosis present

## 2016-10-25 DIAGNOSIS — I272 Pulmonary hypertension, unspecified: Secondary | ICD-10-CM | POA: Diagnosis not present

## 2016-10-25 DIAGNOSIS — E0501 Thyrotoxicosis with diffuse goiter with thyrotoxic crisis or storm: Secondary | ICD-10-CM | POA: Diagnosis present

## 2016-10-25 DIAGNOSIS — Z79899 Other long term (current) drug therapy: Secondary | ICD-10-CM

## 2016-10-25 DIAGNOSIS — Z8701 Personal history of pneumonia (recurrent): Secondary | ICD-10-CM

## 2016-10-25 DIAGNOSIS — Z4659 Encounter for fitting and adjustment of other gastrointestinal appliance and device: Secondary | ICD-10-CM

## 2016-10-25 DIAGNOSIS — I2729 Other secondary pulmonary hypertension: Secondary | ICD-10-CM | POA: Diagnosis present

## 2016-10-25 DIAGNOSIS — E871 Hypo-osmolality and hyponatremia: Secondary | ICD-10-CM | POA: Diagnosis present

## 2016-10-25 DIAGNOSIS — Y95 Nosocomial condition: Secondary | ICD-10-CM | POA: Diagnosis present

## 2016-10-25 DIAGNOSIS — I5021 Acute systolic (congestive) heart failure: Secondary | ICD-10-CM | POA: Diagnosis not present

## 2016-10-25 DIAGNOSIS — I420 Dilated cardiomyopathy: Secondary | ICD-10-CM | POA: Diagnosis present

## 2016-10-25 DIAGNOSIS — I5033 Acute on chronic diastolic (congestive) heart failure: Secondary | ICD-10-CM

## 2016-10-25 DIAGNOSIS — E870 Hyperosmolality and hypernatremia: Secondary | ICD-10-CM | POA: Diagnosis present

## 2016-10-25 DIAGNOSIS — I252 Old myocardial infarction: Secondary | ICD-10-CM | POA: Diagnosis not present

## 2016-10-25 DIAGNOSIS — J9690 Respiratory failure, unspecified, unspecified whether with hypoxia or hypercapnia: Secondary | ICD-10-CM

## 2016-10-25 DIAGNOSIS — G4733 Obstructive sleep apnea (adult) (pediatric): Secondary | ICD-10-CM | POA: Diagnosis present

## 2016-10-25 DIAGNOSIS — T508X5A Adverse effect of diagnostic agents, initial encounter: Secondary | ICD-10-CM | POA: Diagnosis not present

## 2016-10-25 DIAGNOSIS — J9601 Acute respiratory failure with hypoxia: Secondary | ICD-10-CM | POA: Diagnosis present

## 2016-10-25 DIAGNOSIS — I5031 Acute diastolic (congestive) heart failure: Secondary | ICD-10-CM | POA: Diagnosis not present

## 2016-10-25 DIAGNOSIS — A419 Sepsis, unspecified organism: Principal | ICD-10-CM

## 2016-10-25 DIAGNOSIS — J189 Pneumonia, unspecified organism: Secondary | ICD-10-CM | POA: Diagnosis present

## 2016-10-25 DIAGNOSIS — R0602 Shortness of breath: Secondary | ICD-10-CM | POA: Diagnosis present

## 2016-10-25 DIAGNOSIS — Z6841 Body Mass Index (BMI) 40.0 and over, adult: Secondary | ICD-10-CM | POA: Diagnosis not present

## 2016-10-25 DIAGNOSIS — L8990 Pressure ulcer of unspecified site, unspecified stage: Secondary | ICD-10-CM | POA: Insufficient documentation

## 2016-10-25 DIAGNOSIS — E0500 Thyrotoxicosis with diffuse goiter without thyrotoxic crisis or storm: Secondary | ICD-10-CM | POA: Diagnosis present

## 2016-10-25 DIAGNOSIS — J80 Acute respiratory distress syndrome: Secondary | ICD-10-CM

## 2016-10-25 DIAGNOSIS — R079 Chest pain, unspecified: Secondary | ICD-10-CM | POA: Diagnosis not present

## 2016-10-25 DIAGNOSIS — J9600 Acute respiratory failure, unspecified whether with hypoxia or hypercapnia: Secondary | ICD-10-CM | POA: Diagnosis present

## 2016-10-25 DIAGNOSIS — N179 Acute kidney failure, unspecified: Secondary | ICD-10-CM | POA: Diagnosis not present

## 2016-10-25 DIAGNOSIS — Z9289 Personal history of other medical treatment: Secondary | ICD-10-CM

## 2016-10-25 DIAGNOSIS — N141 Nephropathy induced by other drugs, medicaments and biological substances: Secondary | ICD-10-CM | POA: Diagnosis not present

## 2016-10-25 DIAGNOSIS — R0603 Acute respiratory distress: Secondary | ICD-10-CM

## 2016-10-25 LAB — CBC WITH DIFFERENTIAL/PLATELET
BASOS PCT: 0 %
Basophils Absolute: 0 10*3/uL (ref 0.0–0.1)
EOS ABS: 0.2 10*3/uL (ref 0.0–0.7)
EOS PCT: 2 %
HCT: 32.5 % — ABNORMAL LOW (ref 36.0–46.0)
Hemoglobin: 10.4 g/dL — ABNORMAL LOW (ref 12.0–15.0)
LYMPHS ABS: 1.5 10*3/uL (ref 0.7–4.0)
Lymphocytes Relative: 15 %
MCH: 22.3 pg — AB (ref 26.0–34.0)
MCHC: 32 g/dL (ref 30.0–36.0)
MCV: 69.7 fL — AB (ref 78.0–100.0)
MONO ABS: 0.8 10*3/uL (ref 0.1–1.0)
Monocytes Relative: 8 %
NEUTROS ABS: 7.8 10*3/uL — AB (ref 1.7–7.7)
NEUTROS PCT: 75 %
PLATELETS: 234 10*3/uL (ref 150–400)
RBC: 4.66 MIL/uL (ref 3.87–5.11)
RDW: 15.6 % — AB (ref 11.5–15.5)
WBC: 10.3 10*3/uL (ref 4.0–10.5)

## 2016-10-25 LAB — COMPREHENSIVE METABOLIC PANEL
ALK PHOS: 113 U/L (ref 38–126)
ALT: 17 U/L (ref 14–54)
AST: 23 U/L (ref 15–41)
Albumin: 3 g/dL — ABNORMAL LOW (ref 3.5–5.0)
Anion gap: 7 (ref 5–15)
BUN: 9 mg/dL (ref 6–20)
CALCIUM: 8.7 mg/dL — AB (ref 8.9–10.3)
CHLORIDE: 109 mmol/L (ref 101–111)
CO2: 24 mmol/L (ref 22–32)
CREATININE: 0.65 mg/dL (ref 0.44–1.00)
GFR calc non Af Amer: >60 mL/min (ref >60)
Glucose, Bld: 137 mg/dL — ABNORMAL HIGH (ref 65–99)
Potassium: 3.2 mmol/L — ABNORMAL LOW (ref 3.5–5.1)
SODIUM: 140 mmol/L (ref 135–145)
Total Bilirubin: 0.4 mg/dL (ref 0.3–1.2)
Total Protein: 6.6 g/dL (ref 6.5–8.1)

## 2016-10-25 LAB — URINALYSIS, ROUTINE W REFLEX MICROSCOPIC
BILIRUBIN URINE: NEGATIVE
GLUCOSE, UA: NEGATIVE mg/dL
HGB URINE DIPSTICK: NEGATIVE
Ketones, ur: NEGATIVE mg/dL
Leukocytes, UA: NEGATIVE
Nitrite: NEGATIVE
PROTEIN: 30 mg/dL — AB
Specific Gravity, Urine: 1.023 (ref 1.005–1.030)
pH: 6.5 (ref 5.0–8.0)

## 2016-10-25 LAB — I-STAT ARTERIAL BLOOD GAS, ED
ACID-BASE DEFICIT: 4 mmol/L — AB (ref 0.0–2.0)
Bicarbonate: 22.5 mmol/L (ref 20.0–28.0)
O2 Saturation: 80 %
PH ART: 7.303 — AB (ref 7.350–7.450)
PO2 ART: 52 mmHg — AB (ref 83.0–108.0)
TCO2: 24 mmol/L (ref 0–100)
pCO2 arterial: 46 mmHg (ref 32.0–48.0)

## 2016-10-25 LAB — I-STAT BETA HCG BLOOD, ED (MC, WL, AP ONLY)

## 2016-10-25 LAB — URINE MICROSCOPIC-ADD ON

## 2016-10-25 LAB — TSH: TSH: <0.01 u[IU]/mL — ABNORMAL LOW (ref 0.350–4.500)

## 2016-10-25 LAB — I-STAT CG4 LACTIC ACID, ED: Lactic Acid, Venous: 1.21 mmol/L (ref 0.5–1.9)

## 2016-10-25 MED ORDER — DEXTROSE 5 % IV SOLN
500.0000 mg | Freq: Once | INTRAVENOUS | Status: AC
  Administered 2016-10-25: 500 mg via INTRAVENOUS
  Filled 2016-10-25: qty 500

## 2016-10-25 MED ORDER — SODIUM CHLORIDE 0.9 % IV BOLUS (SEPSIS)
1000.0000 mL | Freq: Once | INTRAVENOUS | Status: AC
  Administered 2016-10-25: 1000 mL via INTRAVENOUS

## 2016-10-25 MED ORDER — IOPAMIDOL (ISOVUE-370) INJECTION 76%
INTRAVENOUS | Status: AC
Start: 2016-10-25 — End: 2016-10-25
  Administered 2016-10-25: 100 mL
  Filled 2016-10-25: qty 100

## 2016-10-25 MED ORDER — POTASSIUM CHLORIDE CRYS ER 20 MEQ PO TBCR
40.0000 meq | EXTENDED_RELEASE_TABLET | Freq: Once | ORAL | Status: AC
  Administered 2016-10-25: 40 meq via ORAL
  Filled 2016-10-25: qty 2

## 2016-10-25 MED ORDER — METHYLPREDNISOLONE SODIUM SUCC 125 MG IJ SOLR
125.0000 mg | Freq: Once | INTRAMUSCULAR | Status: AC
  Administered 2016-10-25: 125 mg via INTRAVENOUS
  Filled 2016-10-25: qty 2

## 2016-10-25 MED ORDER — ALBUTEROL SULFATE (2.5 MG/3ML) 0.083% IN NEBU
5.0000 mg | INHALATION_SOLUTION | Freq: Once | RESPIRATORY_TRACT | Status: AC
  Administered 2016-10-25: 5 mg via RESPIRATORY_TRACT
  Filled 2016-10-25: qty 6

## 2016-10-25 MED ORDER — FUROSEMIDE 10 MG/ML IJ SOLN
40.0000 mg | Freq: Once | INTRAMUSCULAR | Status: AC
  Administered 2016-10-25: 40 mg via INTRAVENOUS
  Filled 2016-10-25: qty 4

## 2016-10-25 MED ORDER — DIPHENHYDRAMINE HCL 50 MG/ML IJ SOLN
50.0000 mg | Freq: Once | INTRAMUSCULAR | Status: AC
Start: 2016-10-25 — End: 2016-10-25
  Administered 2016-10-25: 50 mg via INTRAVENOUS
  Filled 2016-10-25: qty 1

## 2016-10-25 MED ORDER — DEXTROSE 5 % IV SOLN
1.0000 g | Freq: Once | INTRAVENOUS | Status: AC
  Administered 2016-10-25: 1 g via INTRAVENOUS
  Filled 2016-10-25: qty 10

## 2016-10-25 NOTE — H&P (Signed)
History and Physical    Tara [name] ZOX:[id number] DOB: Dec 01, 1991 DOA: 10/25/2016  PCP: No PCP Per Patient   Patient coming from: Home  Chief Complaint: Fever, shortness of breath, chest pain, cough  HPI: Tara [name] is a 25 y.o. woman with a history of Graves' disease, HTN, and recent Cesarean delivery complicated by post-partum HCAP (August) who presents to the ED for evaluation of fever, cough, and shortness of breath.  The patient was actually in the ED yesterday with similar complaints.  She did not have an oxygen requirement then.  Chest xray showed bilateral airspace opacities, favoring pneumonia.  She received a dose of IV Rocephin and was discharged with a prescription for a Z pack.  She comes back to the ED today because she has become significantly worse in the past 24 hours.  She was hypoxic to 85% upon arrival, and is requiring 6L [place] to maintain her O2 sats.  She has had chest pain/tightness today.  She is now producing a blood tinged sputum.  She reports fever to 105 at home with associated chills and sweats.  She denies sick contacts.  She takes an annual flu shot.   She is also complaining of pain and swelling in both legs.  She denies increased abdominal girth.  ED Course: Normal lactic acid level.  She is tachycardic and tachypneic, but she does not have hypotension.  She has received 2L of NS.  She has received IV Rocephin and azithromycin.  Chest xray again show patchy bilateral airspace opacities.  CTA chest ordered to rule out PE in the setting of her tachycardia and hypoxia; she is being pre-medicated with high dose IV solumedrol and IV benadryl per protocol.  Respiratory virus panel and influenza screen added to labs.  ABG shows pH 7.3 pCO2 46 pO2 52, but RT not certain that this is not a venous sample.  Upon my arrival to bedside, the patient looked critically ill with high potential for respiratory failure.  PCCM consulted immediately; prompt response greatly  appreciated.  Hospitalist to admit to the stepdown unit.    Review of Systems: As per HPI otherwise 10 point review of systems negative.    Past Medical History:  Diagnosis Date  . Anemia   . CAP (community acquired pneumonia) 02/13/2016  . Chronic bronchitis (HCC)   . Graves disease   . Graves disease   . Headache   . Hypertension   . Myocardial infarction 2013  . Obesity   . Pneumonia "several times"    Past Surgical History:  Procedure Laterality Date  . CESAREAN SECTION    . CESAREAN SECTION N/A 07/21/2016   Procedure: CESAREAN SECTION;  Surgeon: Lesly Dukes, MD;  Location: [facility] B[facility];  Service: Obstetrics;  Laterality: N/A;  . EYE SURGERY Bilateral 2014   "for Graves Disease; eyelid retraction on left; decompression on the right""     reports that she has never smoked. She has never used smokeless tobacco. She reports that she does not drink alcohol or use drugs.  Allergies  Allergen Reactions  . Iodine Hives  . Shellfish Allergy Other (See Comments)    FAMILY HISTORY: She denies any known family history of heart or lung disease.  Prior to Admission medications   Medication Sig Start Date End Date Taking? Authorizing Provider  azithromycin (ZITHROMAX) 250 MG tablet Take 1 tab PO daily Patient taking differently: Take 250 mg by mouth daily. Take 1 tab PO daily 10/24/16  Yes Jaynie Crumble,  PA-C  hydrochlorothiazide (HYDRODIURIL) 25 MG tablet Take 1 tablet (25 mg total) by mouth daily. 07/26/16  Yes Dorathy Kinsman, CNM  labetalol (NORMODYNE) 300 MG tablet Take 1 tablet (300 mg total) by mouth 2 (two) times daily. 10/24/16  Yes Tatyana Kirichenko, PA-C  methimazole (TAPAZOLE) 5 MG tablet Take 3 tablets (15 mg total) by mouth 2 (two) times daily. 10/24/16  Yes Tatyana Kirichenko, PA-C  amoxicillin-clavulanate (AUGMENTIN) 875-125 MG tablet Take 1 tablet by mouth 2 (two) times daily. Patient not taking: Reported on 10/25/2016 07/24/16   Lorne Skeens, MD   butalbital-acetaminophen-caffeine (FIORICET) [phone] MG tablet Take 1-2 tablets by mouth every 6 (six) hours as needed for headache. Patient not taking: Reported on 10/25/2016 07/26/16   Dorathy Kinsman, CNM  HYDROcodone-homatropine [facility]) 5-1.5 MG/5ML syrup Take 5 mLs by mouth every 4 (four) hours as needed for cough. Patient not taking: Reported on 10/25/2016 07/26/16   Dorathy Kinsman, CNM  ibuprofen (ADVIL,MOTRIN) 600 MG tablet Take 1 tablet (600 mg total) by mouth every 6 (six) hours as needed for moderate pain. Patient not taking: Reported on 10/25/2016 07/26/16   Dorathy Kinsman, CNM  oxyCODONE (OXY IR/ROXICODONE) 5 MG immediate release tablet Take 1 tablet (5 mg total) by mouth every 4 (four) hours as needed (pain scale 4-7). Patient not taking: Reported on 10/25/2016 07/24/16   Lorne Skeens, MD  penicillin v potassium (VEETID) 500 MG tablet Take 2 tablets (1,000 mg total) by mouth 2 (two) times daily. X 7 days Patient not taking: Reported on 10/25/2016 10/11/16   Mercedes Camprubi-Soms, PA-C  Prenatal Vit-Fe Fumarate-FA (PRENATAL MULTIVITAMIN) TABS tablet Take 1 tablet by mouth daily at 12 noon. Patient not taking: Reported on 10/25/2016 04/21/16   Valentino Nose, MD    Physical Exam: Vitals:   10/25/16 2030 10/25/16 2045 10/25/16 2100 10/25/16 2110  BP: 143/82 139/74 137/74   Pulse: (!) 123 119 (!) 124   Resp: (!) 33 (!) 38 (!) 45   Temp:    100.9 F (38.3 C)  TempSrc:    Oral  SpO2: 95% 95% 93%   Weight:      Height:          Constitutional: Acutely ill appearing, she is emotional and clammy, obvious work of breathing Vitals:   10/25/16 2030 10/25/16 2045 10/25/16 2100 10/25/16 2110  BP: 143/82 139/74 137/74   Pulse: (!) 123 119 (!) 124   Resp: (!) 33 (!) 38 (!) 45   Temp:    100.9 F (38.3 C)  TempSrc:    Oral  SpO2: 95% 95% 93%   Weight:      Height:       Eyes: PERRL, lids and conjunctivae normal ENMT: Mucous membranes are moist.  Normal dentition.  Neck:  normal appearance, supple Respiratory: Coarse bilaterally; no wheezing.  Increased work of breathing with tachypnea.    Cardiovascular: Tachycardic but regular.  + bilateral lower extremity edema. 2+ pedal pulses.   GI: abdomen is soft and compressible.  No distention.  No tenderness.  Bowel sounds are present. Musculoskeletal:  No joint deformity in upper and lower extremities. Good ROM, no contractures. Normal muscle tone.  Skin: no rashes, warm and clammy Neurologic: No focal deficits Psychiatric: Normal judgment and insight. Alert and oriented x 3. Normal mood.     Labs on Admission: I have personally reviewed following labs and imaging studies  CBC:  Recent Labs Lab 10/24/16 1235 10/25/16 1851  WBC 10.3 10.3  NEUTROABS 8.2* 7.8*  HGB 10.9* 10.4*  HCT 34.5* 32.5*  MCV 69.1* 69.7*  PLT 251 234   Basic Metabolic Panel:  Recent Labs Lab 10/24/16 1235 10/25/16 1851  NA 141 140  K 3.4* 3.2*  CL 108 109  CO2 26 24  GLUCOSE 105* 137*  BUN <5* 9  CREATININE 0.56 0.65  CALCIUM 9.3 8.7*   GFR: Estimated Creatinine Clearance: 141.1 mL/min (by C-G formula based on SCr of 0.65 mg/dL). Liver Function Tests:  Recent Labs Lab 10/24/16 1235 10/25/16 1851  AST 26 23  ALT 22 17  ALKPHOS 121 113  BILITOT 0.9 0.4  PROT 6.6 6.6  ALBUMIN 3.3* 3.0*   Thyroid Function Tests:  Recent Labs  10/25/16 1959  TSH <0.010*   Urine analysis:    Component Value Date/Time   COLORURINE AMBER (A) 10/25/2016 1857   APPEARANCEUR CLEAR 10/25/2016 1857   LABSPEC 1.023 10/25/2016 1857   PHURINE 6.5 10/25/2016 1857   GLUCOSEU NEGATIVE 10/25/2016 1857   HGBUR NEGATIVE 10/25/2016 1857   BILIRUBINUR NEGATIVE 10/25/2016 1857   KETONESUR NEGATIVE 10/25/2016 1857   PROTEINUR 30 (A) 10/25/2016 1857   UROBILINOGEN 0.2 08/25/2016 1014   NITRITE NEGATIVE 10/25/2016 1857   LEUKOCYTESUR NEGATIVE 10/25/2016 1857   Sepsis Labs:  Lactic acid level 0.96 yesterday, 1.21 today  ABG      Component Value Date/Time   PHART 7.303 (L) 10/25/2016 2114   PCO2ART 46.0 10/25/2016 2114   PO2ART 52.0 (L) 10/25/2016 2114   HCO3 22.5 10/25/2016 2114   TCO2 24 10/25/2016 2114   ACIDBASEDEF 4.0 (H) 10/25/2016 2114   O2SAT 80.0 10/25/2016 2114  Respiratory therapist thinks this may be a venous sample.  Radiological Exams on Admission: Dg Chest 2 View  Result Date: 10/24/2016 CLINICAL DATA:  Chest pain. Dry cough. Short of breath since yesterday. Morbid obesity. EXAM: CHEST  2 VIEW COMPARISON:  08/29/2016 FINDINGS: Midline trachea. Cardiomegaly, accentuated by a mildly low lung volumes on the frontal radiograph. No pleural effusion or pneumothorax. Right greater than left, lower lobe predominant airspace disease. IMPRESSION: Bilateral airspace opacities, favoring pneumonia. Pulmonary edema felt less likely, given absence of pleural fluid. Cardiomegaly. Electronically Signed   By: Jeronimo Greaves M.D.   On: 10/24/2016 13:54   Ct Angio Chest Pe W And/or Wo Contrast  Result Date: 10/25/2016 CLINICAL DATA:  Increasing shortness of breath for 3 days. Current treatment for pneumonia. EXAM: CT ANGIOGRAPHY CHEST WITH CONTRAST TECHNIQUE: Multidetector CT imaging of the chest was performed using the standard protocol during bolus administration of intravenous contrast. Multiplanar CT image reconstructions and MIPs were obtained to evaluate the vascular anatomy. CONTRAST:  100 mL Isovue 370 COMPARISON:  04/13/2016 FINDINGS: Cardiovascular: Satisfactory opacification of the pulmonary arteries to the segmental level. No evidence of pulmonary embolism. Cardiac enlargement. Reflux of contrast material into the hepatic veins suggesting right heart failure. No pericardial effusion. Mediastinum/Nodes: No significant lymphadenopathy in the chest. Esophagus is decompressed. Diffuse enlargement of the thyroid gland without focal nodularity suggesting thyroid goiter. Lungs/Pleura: Diffuse airspace consolidation  throughout both lungs. This is increasing since previous study. This could be due to multifocal pneumonia, edema, or ARDS. Upper Abdomen: No acute abnormality. Musculoskeletal: No chest wall abnormality. No acute or significant osseous findings. Review of the MIP images confirms the above findings. IMPRESSION: No evidence of significant pulmonary embolus. Diffuse airspace consolidation in both lungs may indicate edema, pneumonia, or ARDS. Cardiac enlargement with evidence of right heart failure. Electronically Signed   By: Marisa Cyphers.D.  On: 10/25/2016 23:52   Dg Chest Port 1 View  Result Date: 10/25/2016 CLINICAL DATA:  Dyspnea. Diagnosed with pneumonia yesterday. Rapid shallow breathing. EXAM: PORTABLE CHEST 1 VIEW COMPARISON:  10/24/2016 FINDINGS: Lung volumes are low with relative increase in patchy bilateral airspace opacities more so at the lung bases consistent with pneumonic consolidations. No definite cephalization of pulmonary vessels to suggest CHF. Heart is enlarged but stable. No aortic aneurysm. Engorged appearing azygos vein may reflect increase in the patient's hydration status. No pleural effusion or pneumothorax. No suspicious osseous lesions. IMPRESSION: Patchy bilateral airspace opacities are again noted consistent pneumonic infiltrates more so at the lung bases. These appear slightly more confluent than on prior exam. Electronically Signed   By: Tollie Ethavid  Kwon M.D.   On: 10/25/2016 19:43    EKG: Independently reviewed. Sinus tachycardia with nonspecific ST segment changes.  Assessment/Plan Principal Problem:   CAP (community acquired pneumonia) Active Problems:   Graves disease   Acute respiratory failure with hypoxia (HCC)   Sepsis (HCC)      Sepsis with acute hypoxic respiratory failure secondary to multifocal bilateral pneumonia on chest xray. CTA chest completed to rule out acute PE.  Differential for bilateral infiltrates includes edema, infection, ARDS.  --PCCM  input greatly appreciated.   --Trial of BiPAP and diuresis.  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 --Free T4 ordered with admission --Continue home doses of labetalol and methimazole for now   DVT prophylaxis: Lovenox Code Status: FULL Family Communication: Patient alone in the ED at time of admission.  She did not want me to call any family. Disposition Plan: To be determined. Consults called: Critical Care Admission status: Accepted as Inpatient to the stepdown unit. The patient is critically ill with sign of sepsis secondary to pneumonia and significant hypoxia with new O2 requirement of 6L.  She is at increased risk for respiratory failure leading to cardiopulmonary arrest and multisystem organ dysfunction without inpatient care.   TIME SPENT: 75 minutes   Jerene Bearsarter,Calin Fantroy Harrison MD [facility] Pager [phone]  If 7PM-7AM, please contact night-coverage [email or web address] Password [email or web address]  10/25/2016, 9:42 PM

## 2016-10-25 NOTE — ED Notes (Signed)
Pt to get Benadryl at 2243 and CT will be completed at 2343.

## 2016-10-25 NOTE — ED Provider Notes (Addendum)
[facility]-EMERGENCY DEPT Provider Note   CSN: [id number] Arrival date & time: 10/25/16  1824     History   Chief Complaint Chief Complaint  Patient presents with  . Shortness of Breath    HPI Tara [name] is a 25 y.o. female hx of graves disease, remote hx of MI, obesity, Recurrent pneumonia here presenting with worsening shortness of breath. Patient came to the ED yesterday With fever and shortness of breath. Patient was found to have a low-grade temperature and was tachycardic. Patient had a chest x-ray that showed possible pneumonia was given ceftriaxone and discharged home with azithromycin. Patient states that since yesterday, Tara [name] has worsening shortness of breath. Denies any more fevers or any productive cough.  Denies hx of PE or DVT. Has recurrent pneumonia with hypoxia previously.   The history is provided by the patient.    Past Medical History:  Diagnosis Date  . Anemia   . CAP (community acquired pneumonia) 02/13/2016  . Chronic bronchitis (HCC)   . Graves disease   . Graves disease   . Headache   . Hypertension   . Myocardial infarction 2013  . Obesity   . Pneumonia "several times"    Patient Active Problem List   Diagnosis Date Noted  . Chronic hypertension with superimposed preeclampsia 07/21/2016  . IUGR, antenatal 07/07/2016  . Tachycardia   . HCAP (healthcare-associated pneumonia) 04/13/2016  . Hyperthyroidism in pregnancy, antepartum 04/13/2016  . Noncompliance   . Abnormal maternal serum screening test 03/30/2016  . Anemia of mother in pregnancy, antepartum 03/01/2016  . Supervision of high risk pregnancy, antepartum 02/26/2016  . Chronic hypertension during pregnancy, antepartum 02/26/2016  . Previous cesarean delivery affecting pregnancy, antepartum 02/26/2016  . Graves disease 02/11/2016  . Acute respiratory failure with hypoxia (HCC) 02/11/2016  . Morbid obesity due to excess calories [facility](HCC)     Past Surgical History:  Procedure Laterality  Date  . CESAREAN SECTION    . CESAREAN SECTION N/A 07/21/2016   Procedure: CESAREAN SECTION;  Surgeon: Lesly DukesKelly H Leggett, MD;  [facility];  Service: Obstetrics;  Laterality: N/A;  . EYE SURGERY Bilateral 2014   "for Graves Disease; eyelid retraction on left; decompression on the right""    OB History    Gravida Para Term Preterm AB Living   5 5 4 1  0 5   SAB TAB Ectopic Multiple Live Births   0 0 0 0 4       Home Medications    Prior to Admission medications   Medication Sig Start Date End Date Taking? Authorizing Provider  amoxicillin-clavulanate (AUGMENTIN) 875-125 MG tablet Take 1 tablet by mouth 2 (two) times daily. Patient not taking: Reported on 10/24/2016 07/24/16   Lorne SkeensNicholas Michael Schenk, MD  azithromycin [facility](ZITHROMAX) 250 MG tablet Take 1 tab PO daily 10/24/16   Jaynie Crumbleatyana Kirichenko, PA-C  butalbital-acetaminophen-caffeine (FIORICET) 50-325-40 MG tablet Take 1-2 tablets by mouth every 6 (six) hours as needed for headache. Patient not taking: Reported on 10/24/2016 07/26/16   Dorathy KinsmanVirginia Smith, CNM  hydrochlorothiazide (HYDRODIURIL) 25 MG tablet Take 1 tablet (25 mg total) by mouth daily. 07/26/16   Dorathy KinsmanVirginia Smith, CNM  HYDROcodone-homatropine (HYCODAN) 5-1.5 MG/5ML syrup Take 5 mLs by mouth every 4 (four) hours as needed for cough. Patient not taking: Reported on 10/24/2016 07/26/16   Dorathy KinsmanVirginia Smith, CNM  ibuprofen (ADVIL,MOTRIN) 600 MG tablet Take 1 tablet (600 mg total) by mouth every 6 (six) hours as needed for moderate pain. Patient not taking: Reported on  10/24/2016 07/26/16   Dorathy Kinsman, CNM  labetalol (NORMODYNE) 300 MG tablet Take 1 tablet (300 mg total) by mouth 2 (two) times daily. 10/24/16   Tatyana Kirichenko, PA-C  methimazole (TAPAZOLE) 5 MG tablet Take 3 tablets (15 mg total) by mouth 2 (two) times daily. 10/24/16   Tatyana Kirichenko, PA-C  oxyCODONE (OXY IR/ROXICODONE) 5 MG immediate release tablet Take 1 tablet (5 mg total) by mouth every 4 (four) hours as  needed (pain scale 4-7). Patient not taking: Reported on 10/24/2016 07/24/16   Lorne Skeens, MD  penicillin v potassium (VEETID) 500 MG tablet Take 2 tablets (1,000 mg total) by mouth 2 (two) times daily. X 7 days Patient not taking: Reported on 10/24/2016 10/11/16   Mercedes Camprubi-Soms, PA-C  Prenatal Vit-Fe Fumarate-FA (PRENATAL MULTIVITAMIN) TABS tablet Take 1 tablet by mouth daily at 12 noon. Patient not taking: Reported on 10/24/2016 04/21/16   Valentino Nose, MD    Family History No family history on file.  Social History Social History  Substance Use Topics  . Smoking status: Never Smoker  . Smokeless tobacco: Never Used  . Alcohol use No     Allergies   Iodine and Shellfish allergy   Review of Systems Review of Systems  Respiratory: Positive for shortness of breath.   All other systems reviewed and are negative.    Physical Exam Updated Vital Signs BP 140/86   Pulse (!) 122   Temp 98.6 F (37 C) (Oral)   Resp (!) 29   Ht 5\' 6"  (1.676 m)   Wt 270 lb (122.5 kg)   LMP 10/23/2016   SpO2 96%   BMI 43.58 kg/m   Physical Exam  Constitutional: Tara [name] is oriented to person, place, and time.  Ill appearing   HENT:  Head: Normocephalic.  Eyes: EOM are normal. Pupils are equal, round, and reactive to light.  Neck: Normal range of motion. Neck supple.  Cardiovascular:  Tachycardic   Pulmonary/Chest:  Diminished bilateral bases, crackles R base   Abdominal: Soft. Bowel sounds are normal. Tara [name] exhibits no distension. There is no tenderness. There is no guarding.  Musculoskeletal: Normal range of motion. Tara [name] exhibits no edema or tenderness.  Neurological: Tara [name] is alert and oriented to person, place, and time. Tara [name] displays normal reflexes. No cranial nerve deficit.  Skin: Skin is warm.  Psychiatric: Tara [name] has a normal mood and affect.  Nursing note and vitals reviewed.    ED Treatments / Results  Labs (all labs ordered are listed, but only abnormal results  are displayed) Labs Reviewed  COMPREHENSIVE METABOLIC PANEL - Abnormal; Notable for the following:       Result Value   Potassium 3.2 (*)    Glucose, Bld 137 (*)    Calcium 8.7 (*)    Albumin 3.0 (*)    All other components within normal limits  CBC WITH DIFFERENTIAL/PLATELET - Abnormal; Notable for the following:    Hemoglobin 10.4 (*)    HCT 32.5 (*)    MCV 69.7 (*)    MCH 22.3 (*)    RDW 15.6 (*)    Neutro Abs 7.8 (*)    All other components within normal limits  URINALYSIS, ROUTINE W REFLEX MICROSCOPIC (NOT AT [facility]) - Abnormal; Notable for the following:    Color, Urine AMBER (*)    Protein, ur 30 (*)    All other components within normal limits  URINE MICROSCOPIC-ADD ON - Abnormal; Notable for the following:    Squamous Epithelial / LPF  0-5 (*)    Bacteria, UA RARE (*)    All other components within normal limits  CULTURE, BLOOD (ROUTINE X 2)  CULTURE, BLOOD (ROUTINE X 2)  URINE CULTURE  TSH  I-STAT CG4 LACTIC ACID, ED  I-STAT BETA HCG BLOOD, ED ([facility], [facility], [facility] ONLY)    EKG  EKG Interpretation None       Radiology Dg Chest 2 View  Result Date: 10/24/2016 CLINICAL DATA:  Chest pain. Dry cough. Short of breath since yesterday. Morbid obesity. EXAM: CHEST  2 VIEW COMPARISON:  08/29/2016 FINDINGS: Midline trachea. Cardiomegaly, accentuated by a mildly low lung volumes on the frontal radiograph. No pleural effusion or pneumothorax. Right greater than left, lower lobe predominant airspace disease. IMPRESSION: Bilateral airspace opacities, favoring pneumonia. Pulmonary edema felt less likely, given absence of pleural fluid. Cardiomegaly. Electronically Signed   By: Jeronimo Greaves M.D.   On: 10/24/2016 13:54   Dg Chest Port 1 View  Result Date: 10/25/2016 CLINICAL DATA:  Dyspnea. Diagnosed with pneumonia yesterday. Rapid shallow breathing. EXAM: PORTABLE CHEST 1 VIEW COMPARISON:  10/24/2016 FINDINGS: Lung volumes are low with relative increase in patchy bilateral airspace  opacities more so at the lung bases consistent with pneumonic consolidations. No definite cephalization of pulmonary vessels to suggest CHF. Heart is enlarged but stable. No aortic aneurysm. Engorged appearing azygos vein may reflect increase in the patient's hydration status. No pleural effusion or pneumothorax. No suspicious osseous lesions. IMPRESSION: Patchy bilateral airspace opacities are again noted consistent pneumonic infiltrates more so at the lung bases. These appear slightly more confluent than on prior exam. Electronically Signed   By: Tollie Eth M.D.   On: 10/25/2016 19:43    Procedures Procedures (including critical care time)  CRITICAL CARE Performed by: Richardean Canal   Total critical care time:30 minutes  Critical care time was exclusive of separately billable procedures and treating other patients.  Critical care was necessary to treat or prevent imminent or life-threatening deterioration.  Critical care was time spent personally by me on the following activities: development of treatment plan with patient and/or surrogate as well as nursing, discussions with consultants, evaluation of patient's response to treatment, examination of patient, obtaining history from patient or surrogate, ordering and performing treatments and interventions, ordering and review of laboratory studies, ordering and review of radiographic studies, pulse oximetry and re-evaluation of patient's condition.   Medications Ordered in ED Medications  azithromycin (ZITHROMAX) 500 mg in dextrose 5 % 250 mL IVPB (500 mg Intravenous New Bag/Given 10/25/16 2043)  sodium chloride 0.9 % bolus 1,000 mL (not administered)  albuterol (PROVENTIL) (2.5 MG/3ML) 0.083% nebulizer solution 5 mg (5 mg Nebulization Given 10/25/16 1905)  sodium chloride 0.9 % bolus 1,000 mL (1,000 mLs Intravenous New Bag/Given 10/25/16 1905)  methylPREDNISolone sodium succinate (SOLU-MEDROL) 125 mg/2 mL injection 125 mg (125 mg Intravenous  Given 10/25/16 1943)  cefTRIAXone (ROCEPHIN) 1 g in dextrose 5 % 50 mL IVPB (1 g Intravenous New Bag/Given 10/25/16 1944)     Initial Impression / Assessment and Plan / ED Course  I have reviewed the triage vital signs and the nursing notes.  Pertinent labs & imaging results that were available during my care of the patient were reviewed by me and considered in my medical decision making (see chart for details).  Clinical Course    MARTICIA PUSATERI is a 25 y.o. female here with SOB, cough. Fever yesterday. CXR yesterday showed pneumonia. Tara [name] is hypoxic to 82% on RA. Consider worsening pneumonia  vs PE. Patient has iodine allergy and needs premedication. Will get labs, lactate, CXR. CT angio. Will give steroids and benadryl for premedication. Will need admission.   8:52 PM Patient's WBC 10. Lactate 1.2. CXR showed worsening pneumonia. Given rocephin, azithro. Still tachycardic. Needs premedication for CT angio. Hospitalist to admit.   10 pm Patient became more tachycardic after admission. ABG showed pH 7.3. PO2 52, pCO2 46. Hospitalist consulted critical care. Will put patient on bipap. Consider ARDS as well so will try and diurese off some fluid with lasix. Will get CT angio after pre medication. Hospitalist and critical care to follow up CT.      Final Clinical Impressions(s) / ED Diagnoses   Final diagnoses:  None    New Prescriptions New Prescriptions   No medications on file     Charlynne Pander, MD 10/25/16 2053    Charlynne Pander, MD 10/26/16 [phone]

## 2016-10-25 NOTE — Consult Note (Signed)
Name: Tara [name] MRN: [id number] DOB: 08-Feb-1991    ADMISSION DATE:  10/25/2016 CONSULTATION DATE:  10/25/2016  REFERRING MD :  Dr. Montez Moritaarter [facility]  CHIEF COMPLAINT:  SOB, cough  HISTORY OF PRESENT ILLNESS:  25 year old female with PMH as below, which is significant for recent pregnancy with healthy cesarean delivery. Pregnancy complicated by pre-eclampsia. Her post-partum course was complicated by pneumonia, but there is some thought that there may have been post-partum cardiomyopathy at that time as well. This is not supported by the EMR. [facility] with complaints of SOB and productive cough. CXR was done demonstrating bilateral airspace opacifications read as pneumonia. She was given IM ceftriaxone, a Z-pack, and was discharged. She presented again to [facility] again 11/6 with similar complaints. CXR was again positive for bilateral opacifications. O2 sats were 85% on room air and she had increased work of breathing. Sats improved on 6L [place], but she continued to have dyspnea. There was concern for PE so she was pre-medicated due to iodine allergy. No scan at this time. Due to complexity and no improvement on outpatient antibiotics, PCCM consulted for further assistance.   She describes 3 days of worsening cough, pick/blood streak sputum, and dyspnea. Ankles swollen, however, this has been going on for some time. Endorses fevers/chills at home. Laboratory eval in ED significant for WBC only 10.3, Hgb 10.4, K 3.2, BNP 205.8.   SIGNIFICANT EVENTS  07/2016 delivery of healthy baby boy, post partum course complicated by PNA 11/5 ED for SOB, thought to be CAP and discharged with ABX 11/6 back to ED with worsening dyspnea.   STUDIES:  CTA chest 11/6 >   PAST MEDICAL HISTORY :   has a past medical history of Anemia; CAP (community acquired pneumonia) (02/13/2016); Chronic bronchitis (HCC); Graves disease; Graves disease; Headache; Hypertension; Myocardial  infarction (2013); Obesity; and Pneumonia ("several times").  has a past surgical history that includes Eye surgery (Bilateral, 2014); Cesarean section; and Cesarean section (N/A, 07/21/2016). Prior to Admission medications   Medication Sig Start Date End Date Taking? Authorizing Provider  azithromycin (ZITHROMAX) 250 MG tablet Take 1 tab PO daily Patient taking differently: Take 250 mg by mouth daily. Take 1 tab PO daily 10/24/16  Yes Tatyana Kirichenko, PA-C  hydrochlorothiazide (HYDRODIURIL) 25 MG tablet Take 1 tablet (25 mg total) by mouth daily. 07/26/16  Yes Dorathy KinsmanVirginia Smith, CNM  labetalol (NORMODYNE) 300 MG tablet Take 1 tablet (300 mg total) by mouth 2 (two) times daily. 10/24/16  Yes Tatyana Kirichenko, PA-C  methimazole (TAPAZOLE) 5 MG tablet Take 3 tablets (15 mg total) by mouth 2 (two) times daily. 10/24/16  Yes Tatyana Kirichenko, PA-C  amoxicillin-clavulanate (AUGMENTIN) 875-125 MG tablet Take 1 tablet by mouth 2 (two) times daily. Patient not taking: Reported on 10/25/2016 07/24/16   Lorne SkeensNicholas Michael Schenk, MD  butalbital-acetaminophen-caffeine (FIORICET) [phone] MG tablet Take 1-2 tablets by mouth every 6 (six) hours as needed for headache. Patient not taking: Reported on 10/25/2016 07/26/16   Dorathy KinsmanVirginia Smith, CNM  HYDROcodone-homatropine [facility](HYCODAN) 5-1.5 MG/5ML syrup Take 5 mLs by mouth every 4 (four) hours as needed for cough. Patient not taking: Reported on 10/25/2016 07/26/16   Dorathy KinsmanVirginia Smith, CNM  ibuprofen (ADVIL,MOTRIN) 600 MG tablet Take 1 tablet (600 mg total) by mouth every 6 (six) hours as needed for moderate pain. Patient not taking: Reported on 10/25/2016 07/26/16   Dorathy KinsmanVirginia Smith, CNM  oxyCODONE (OXY IR/ROXICODONE) 5 MG immediate release tablet Take 1  tablet (5 mg total) by mouth every 4 (four) hours as needed (pain scale 4-7). Patient not taking: Reported on 10/25/2016 07/24/16   Lorne Skeens, MD  penicillin v potassium (VEETID) 500 MG tablet Take 2 tablets (1,000 mg total) by  mouth 2 (two) times daily. X 7 days Patient not taking: Reported on 10/25/2016 10/11/16   Mercedes Camprubi-Soms, PA-C  Prenatal Vit-Fe Fumarate-FA (PRENATAL MULTIVITAMIN) TABS tablet Take 1 tablet by mouth daily at 12 noon. Patient not taking: Reported on 10/25/2016 04/21/16   Valentino Nose, MD   Allergies  Allergen Reactions  . Iodine Hives  . Shellfish Allergy Other (See Comments)    FAMILY HISTORY:  family history is not on file. SOCIAL HISTORY:  reports that she has never smoked. She has never used smokeless tobacco. She reports that she does not drink alcohol or use drugs.  REVIEW OF SYSTEMS:   Bolds are positive  Constitutional: weight loss, gain, night sweats, Fevers, chills, fatigue .  HEENT: headaches, Sore throat, sneezing, nasal congestion, post nasal drip, Difficulty swallowing, Tooth/dental problems, visual complaints visual changes, ear ache CV:  chest pain, radiates: ,Orthopnea, PND, swelling in lower extremities, dizziness, palpitations, syncope.  GI  heartburn, indigestion, abdominal pain, nausea, vomiting, diarrhea, change in bowel habits, loss of appetite, bloody stools.  Resp: cough, productive: , hemoptysis(streaks), dyspnea, chest pain, pleuritic.  Skin: rash or itching or icterus GU: dysuria, change in color of urine, urgency or frequency. flank pain, hematuria  MS: joint pain or swelling. decreased range of motion  Psych: change in mood or affect. depression or anxiety.  Neuro: difficulty with speech, weakness, numbness, ataxia    SUBJECTIVE:   VITAL SIGNS: Temp:  [98.6 F (37 C)-100.9 F (38.3 C)] 100.9 F (38.3 C) (11/06 2110) Pulse Rate:  [113-127] 117 (11/06 2145) Resp:  [21-62] 62 (11/06 2145) BP: (126-156)/(64-86) 136/73 (11/06 2145) SpO2:  [82 %-100 %] 97 % (11/06 2145) Weight:  [122.5 kg (270 lb)] 122.5 kg (270 lb) (11/06 1849)  PHYSICAL EXAMINATION: General:  Obese female in respiratory distress Neuro:  Alert, oriented, non-focal HEENT:   [place]/AT, PERRL, exophthalmus, no obvious JVD Cardiovascular:  Tachy, regular. +2 BLE edema Lungs:  Coarse crackles entire posterior chest Abdomen:  Soft, non-tender, non-distended Musculoskeletal:  No acute deformity Skin:  Grossly intact   Recent Labs Lab 10/24/16 1235 10/25/16 1851  NA 141 140  K 3.4* 3.2*  CL 108 109  CO2 26 24  BUN <5* 9  CREATININE 0.56 0.65  GLUCOSE 105* 137*    Recent Labs Lab 10/24/16 1235 10/25/16 1851  HGB 10.9* 10.4*  HCT 34.5* 32.5*  WBC 10.3 10.3  PLT 251 234   Dg Chest 2 View  Result Date: 10/24/2016 CLINICAL DATA:  Chest pain. Dry cough. Short of breath since yesterday. Morbid obesity. EXAM: CHEST  2 VIEW COMPARISON:  08/29/2016 FINDINGS: Midline trachea. Cardiomegaly, accentuated by a mildly low lung volumes on the frontal radiograph. No pleural effusion or pneumothorax. Right greater than left, lower lobe predominant airspace disease. IMPRESSION: Bilateral airspace opacities, favoring pneumonia. Pulmonary edema felt less likely, given absence of pleural fluid. Cardiomegaly. Electronically Signed   By: Jeronimo Greaves M.D.   On: 10/24/2016 13:54   Dg Chest Port 1 View  Result Date: 10/25/2016 CLINICAL DATA:  Dyspnea. Diagnosed with pneumonia yesterday. Rapid shallow breathing. EXAM: PORTABLE CHEST 1 VIEW COMPARISON:  10/24/2016 FINDINGS: Lung volumes are low with relative increase in patchy bilateral airspace opacities more so at the lung bases consistent with  pneumonic consolidations. No definite cephalization of pulmonary vessels to suggest CHF. Heart is enlarged but stable. No aortic aneurysm. Engorged appearing azygos vein may reflect increase in the patient's hydration status. No pleural effusion or pneumothorax. No suspicious osseous lesions. IMPRESSION: Patchy bilateral airspace opacities are again noted consistent pneumonic infiltrates more so at the lung bases. These appear slightly more confluent than on prior exam. Electronically Signed   By:  Tollie Eth M.D.   On: 10/25/2016 19:43    ASSESSMENT / PLAN:  Acute hypoxemic respiratory failure: true etiology unclear at this point. Pulmonary edema vs atypical pneumonia most likely. Some concern PE, however with bilateral infiltrates on CXR this is less likely. The history she gives raises concern for PNA, however, radiography and physical exam more coincide with pulmonary edema.   Pulmonary Edema - Keep O2 sats > 92% - STAT BiPAP with EPAP 8 and goal Vt > - Lasix 40mg  now with Kdur - Echocardiogram  CAP vs HCAP - Its been less than 3 months since her admission for delivery, so need to cover with HCAP coverage. Zosyn, Vancomycin, would keep Azithromycin as PNA appearance atypical on CXR - Respiratory virus panel - Sputum culture - Urine strep and legionella antigens - Assess and trend procalcitonin  PE - less likely but does have risk with recent hospitalization. [name] score 1-2.5 (depending on whether bloody streaks in mucous is true hemoptysis - I favor no). Both fall into low risk category.  Hyperthyroidism - TSH low today, there has been some question of her compliance in the past. History of thyroid storm.  - Assess free t3, t4 - Management per primary.  Joneen Roach, AGACNP-BC [facility] Pager [phone] or [phone]  10/25/2016 11:00 PM   ATTENDING NOTE / ATTESTATION NOTE :   I have discussed the case with the resident/APP  Joneen Roach.   I agree with the resident/APP's  history, physical examination, assessment, and plans.    I have edited the above note and modified it according to our agreed history, physical examination, assessment and plan.   Briefly, patient is being seen for dyspnea. Nonsmoker. Not known to have any lung disease or allergies. Unfortunately, she has poor insight to illness and she does not have a primary care doctor despite her comorbidities.  Patient is known to have Graves' disease. She gave  birth on 07/21/2016, third child. She was diagnosed with preeclampsia during that pregnancy. According to her, the pregnancy was complicated with preeclampsia. During her 6 week follow-up, she was given labetalol for her hypertension. Has not really followed up with any doctor except once after pregnancy. She takes methimazole, 3 tablets twice a day.. No medicine adjustment during and after pregnancy. No one really falls up on her Graves' disease. The only time she had a thyroid storm was in 2009.   During pregnancy, she started being more winded than her baseline. After pregnancy, this persisted, slowly getting worse. She also developed orthopnea. She had baseline swelling of her legs which got worse since August. She has  gained 10 pounds since that time. She went to the emergency room 2 days ago for dyspnea and was prescribed with antibiotics for pneumonia. She did not get better so she went back to the emergency room yesterday. She also has a chronic cough, productive of white sputum the last day coupled with low-grade fever x 1 day. Initially, her O2 saturation was 85% on room air and she was tachypneic in the 40s. She  was started on broad-spectrum antibiotics for possible healthcare associated pneumonia. She also received Lasix the last couple of hours. She has made around less than a liter urine and her breathing has gotten better since getting the Lasix. She tried the BiPAP but became claustrophobic so she stopped it. During my interview, she was able to speak in full sentences before she got dyspneic. Earlier on, she could barely speak phrases without being too winded.  Pt seen, in mild distress. Blood pressure 131/72, pulse 104, respiratory rate 24-32, 98% on 4 L oxygen. Temperature was 100.9 Fahrenheit. Somewhat prominent neck veins. (+) exopthalmos.  No oral thrush. Crowded airway. Breath sounds were good and equal, crackles bibasilar. No rhonchi or wheezing. Cardiac exam showed tachycardia. Good S1  and S2. No murmur heard. Abdominal exam showed positive bowel sounds, soft, no masses. Extremities revealed grade 2 edema. Skin was warm and dry. Rest of exam per Paul's exam.   Labs reviewed. Chest x-ray showed cardiomegaly and bilateral infiltrates, no pleural effusion seen. Previous chest x-ray did not reveal cardiomegaly nor infiltrates (07/2016). There was a 2-D echo in April/2017 which was normal. Rest of labs were unremarkable. Chest CT scan did not reveal filling defect. (+) bilateral infiltrates. (-) effusion. ABG 7.3/46/52 (? Mixed venous sample).  Assessment: 1. 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 Differentials include: Possible healthcare associated pneumonia, viral infection. CT scan did not reveal any filling defect but was somewhat limited secondary to patient's body habitus. Possible untreated obstructive sleep apnea given her obesity and crowded airway but patient denies symptoms. 2. Graves' disease. Clinically in hyperthyroid state. No significant evidence or signs and symptoms for thyroid storm at this moment.  3. Patient with VERY POOR INSIGHT TO ILLNESS.    Plan : 1. I stressed to the patient the importance of follow-up with her regular doctor and taking her meds regularly.  2. She is somewhat better with diuresis. I will continue diuresis as long as her creatinine allows. Trial with BiPAP again tonight. I explained to her that BiPAP will help her with her work of breathing. Keep O2 saturation >= 90%.  3. Certainly, suggest to continue with broad-spectrum antibiotics given her increased risk for healthcare associated pneumonia given her recent pregnancy and recent emergency room visit. Follow up on cultures and pro calcitonin. Anticipate, we can easily de-escalate if she clinically improves the next 12-24 hours. 4. Agree with 2-D echo. 5. Agree with stepdown unit. 6. Check free T4. Monitor her hyperthyroidism state.  Hopefully, she will not go into thyroid storm. 7. We need to make sure that she has good follow-up with PCP.  I have spent 30  minutes of critical care time with this patient today.  Family :  No family at bedside. I extensively discussed the plan of care with the patient.   Pollie Meyer, MD 10/26/2016, 12:15 AM [facility] Pager [phone] After 3 pm or if no answer, call [phone]

## 2016-10-25 NOTE — ED Notes (Signed)
Patient undressed, in gown, continuous pulse oximetry and blood pressure cuff; patient stating at 80%RA - 82%RA; placed on 3.5L oxygen [place]

## 2016-10-25 NOTE — ED Notes (Signed)
Admitting MD at he bedside 

## 2016-10-25 NOTE — ED Notes (Signed)
Patient transported to CT 

## 2016-10-25 NOTE — ED Triage Notes (Signed)
Pt BIB [facility] for evaluation of SOB today. Pt dx with PNA yesterday and d/c with azithromax. Pt. Taking meds as prescribed. Pt. New onset of SOB today, lung fields diminished, pt. Rapid shallow breathing. Pt. On NRB on arrival, EMS reports home O2 sats 86%. NO meds given in route.

## 2016-10-26 ENCOUNTER — Inpatient Hospital Stay (HOSPITAL_COMMUNITY): Payer: Medicaid Other

## 2016-10-26 DIAGNOSIS — I420 Dilated cardiomyopathy: Secondary | ICD-10-CM

## 2016-10-26 DIAGNOSIS — R079 Chest pain, unspecified: Secondary | ICD-10-CM

## 2016-10-26 DIAGNOSIS — I272 Pulmonary hypertension, unspecified: Secondary | ICD-10-CM

## 2016-10-26 DIAGNOSIS — J189 Pneumonia, unspecified organism: Secondary | ICD-10-CM

## 2016-10-26 DIAGNOSIS — A419 Sepsis, unspecified organism: Secondary | ICD-10-CM

## 2016-10-26 LAB — COMPREHENSIVE METABOLIC PANEL
ALT: 18 U/L (ref 14–54)
AST: 29 U/L (ref 15–41)
Albumin: 3 g/dL — ABNORMAL LOW (ref 3.5–5.0)
Alkaline Phosphatase: 121 U/L (ref 38–126)
Anion gap: 9 (ref 5–15)
BILIRUBIN TOTAL: 0.6 mg/dL (ref 0.3–1.2)
BUN: 10 mg/dL (ref 6–20)
CALCIUM: 8.9 mg/dL (ref 8.9–10.3)
CO2: 22 mmol/L (ref 22–32)
CREATININE: 0.57 mg/dL (ref 0.44–1.00)
Chloride: 110 mmol/L (ref 101–111)
Glucose, Bld: 170 mg/dL — ABNORMAL HIGH (ref 65–99)
Potassium: 3.8 mmol/L (ref 3.5–5.1)
Sodium: 141 mmol/L (ref 135–145)
TOTAL PROTEIN: 6.8 g/dL (ref 6.5–8.1)

## 2016-10-26 LAB — INFLUENZA PANEL BY PCR (TYPE A & B)
INFLAPCR: NEGATIVE
INFLBPCR: NEGATIVE

## 2016-10-26 LAB — STREP PNEUMONIAE URINARY ANTIGEN: STREP PNEUMO URINARY ANTIGEN: NEGATIVE

## 2016-10-26 LAB — RESPIRATORY PANEL BY PCR
Adenovirus: NOT DETECTED
BORDETELLA PERTUSSIS-RVPCR: NOT DETECTED
CHLAMYDOPHILA PNEUMONIAE-RVPPCR: NOT DETECTED
CORONAVIRUS 229E-RVPPCR: NOT DETECTED
CORONAVIRUS HKU1-RVPPCR: NOT DETECTED
Coronavirus NL63: NOT DETECTED
Coronavirus OC43: NOT DETECTED
INFLUENZA B-RVPPCR: NOT DETECTED
Influenza A: NOT DETECTED
METAPNEUMOVIRUS-RVPPCR: NOT DETECTED
Mycoplasma pneumoniae: NOT DETECTED
PARAINFLUENZA VIRUS 2-RVPPCR: NOT DETECTED
PARAINFLUENZA VIRUS 3-RVPPCR: NOT DETECTED
Parainfluenza Virus 1: NOT DETECTED
Parainfluenza Virus 4: NOT DETECTED
RESPIRATORY SYNCYTIAL VIRUS-RVPPCR: NOT DETECTED
RHINOVIRUS / ENTEROVIRUS - RVPPCR: NOT DETECTED

## 2016-10-26 LAB — TROPONIN I
TROPONIN I: 0.03 ng/mL — AB (ref <0.03)
Troponin I: <0.03 ng/mL (ref <0.03)

## 2016-10-26 LAB — CBC WITH DIFFERENTIAL/PLATELET
BASOS ABS: 0 10*3/uL (ref 0.0–0.1)
BASOS PCT: 0 %
EOS ABS: 0 10*3/uL (ref 0.0–0.7)
EOS PCT: 0 %
HCT: 33.3 % — ABNORMAL LOW (ref 36.0–46.0)
Hemoglobin: 10.5 g/dL — ABNORMAL LOW (ref 12.0–15.0)
Lymphocytes Relative: 8 %
Lymphs Abs: 0.8 10*3/uL (ref 0.7–4.0)
MCH: 22.2 pg — ABNORMAL LOW (ref 26.0–34.0)
MCHC: 31.5 g/dL (ref 30.0–36.0)
MCV: 70.3 fL — AB (ref 78.0–100.0)
MONO ABS: 0.5 10*3/uL (ref 0.1–1.0)
Monocytes Relative: 5 %
Neutro Abs: 8 10*3/uL — ABNORMAL HIGH (ref 1.7–7.7)
Neutrophils Relative %: 87 %
PLATELETS: 239 10*3/uL (ref 150–400)
RBC: 4.74 MIL/uL (ref 3.87–5.11)
RDW: 15.2 % (ref 11.5–15.5)
WBC: 9.3 10*3/uL (ref 4.0–10.5)

## 2016-10-26 LAB — MAGNESIUM: MAGNESIUM: 2 mg/dL (ref 1.7–2.4)

## 2016-10-26 LAB — T4, FREE: FREE T4: 4.77 ng/dL — AB (ref 0.61–1.12)

## 2016-10-26 LAB — PROCALCITONIN

## 2016-10-26 LAB — BRAIN NATRIURETIC PEPTIDE: B NATRIURETIC PEPTIDE 5: 227.6 pg/mL — AB (ref 0.0–100.0)

## 2016-10-26 LAB — ECHOCARDIOGRAM COMPLETE
Height: 66 in
WEIGHTICAEL: 4051.2 [oz_av]

## 2016-10-26 LAB — LACTIC ACID, PLASMA: LACTIC ACID, VENOUS: 1.5 mmol/L (ref 0.5–1.9)

## 2016-10-26 LAB — MRSA PCR SCREENING: MRSA by PCR: POSITIVE — AB

## 2016-10-26 MED ORDER — METHIMAZOLE 5 MG PO TABS
15.0000 mg | ORAL_TABLET | Freq: Three times a day (TID) | ORAL | Status: DC
  Administered 2016-10-26 – 2016-11-02 (×21): 15 mg via ORAL
  Administered 2016-11-03: 10 mg via ORAL
  Filled 2016-10-26 (×26): qty 1

## 2016-10-26 MED ORDER — MORPHINE SULFATE (PF) 2 MG/ML IV SOLN
2.0000 mg | INTRAVENOUS | Status: DC | PRN
  Administered 2016-10-26: 2 mg via INTRAVENOUS
  Filled 2016-10-26: qty 1

## 2016-10-26 MED ORDER — MORPHINE SULFATE (PF) 2 MG/ML IV SOLN
2.0000 mg | Freq: Once | INTRAVENOUS | Status: AC
  Administered 2016-10-26: 2 mg via INTRAVENOUS

## 2016-10-26 MED ORDER — GUAIFENESIN 100 MG/5ML PO SOLN
5.0000 mL | ORAL | Status: DC | PRN
  Administered 2016-10-26 – 2016-11-02 (×2): 100 mg via ORAL
  Filled 2016-10-26 (×3): qty 5

## 2016-10-26 MED ORDER — ORAL CARE MOUTH RINSE
15.0000 mL | Freq: Two times a day (BID) | OROMUCOSAL | Status: DC
Start: 2016-10-26 — End: 2016-10-27
  Administered 2016-10-26: 15 mL via OROMUCOSAL

## 2016-10-26 MED ORDER — LORAZEPAM 2 MG/ML IJ SOLN
1.0000 mg | Freq: Two times a day (BID) | INTRAMUSCULAR | Status: DC | PRN
  Administered 2016-10-26: 2 mg via INTRAVENOUS
  Filled 2016-10-26: qty 1

## 2016-10-26 MED ORDER — LORAZEPAM 2 MG/ML IJ SOLN
2.0000 mg | Freq: Three times a day (TID) | INTRAMUSCULAR | Status: DC
  Administered 2016-10-26: 2 mg via INTRAVENOUS
  Filled 2016-10-26: qty 1

## 2016-10-26 MED ORDER — CHLORHEXIDINE GLUCONATE CLOTH 2 % EX PADS
6.0000 | MEDICATED_PAD | Freq: Every day | CUTANEOUS | Status: AC
  Administered 2016-10-26 – 2016-10-30 (×5): 6 via TOPICAL

## 2016-10-26 MED ORDER — METHYLPREDNISOLONE SODIUM SUCC 125 MG IJ SOLR
60.0000 mg | INTRAMUSCULAR | Status: DC
  Administered 2016-10-26: 60 mg via INTRAVENOUS
  Filled 2016-10-26: qty 2

## 2016-10-26 MED ORDER — FUROSEMIDE 10 MG/ML IJ SOLN
40.0000 mg | Freq: Two times a day (BID) | INTRAMUSCULAR | Status: DC
  Administered 2016-10-26: 40 mg via INTRAVENOUS
  Filled 2016-10-26: qty 4

## 2016-10-26 MED ORDER — MORPHINE SULFATE (PF) 2 MG/ML IV SOLN
INTRAVENOUS | Status: AC
  Filled 2016-10-26: qty 1

## 2016-10-26 MED ORDER — LORAZEPAM 2 MG/ML IJ SOLN: 2.0000 mg | Freq: Two times a day (BID) | INTRAMUSCULAR | Status: DC

## 2016-10-26 MED ORDER — LABETALOL HCL 100 MG PO TABS
300.0000 mg | ORAL_TABLET | Freq: Two times a day (BID) | ORAL | Status: DC
  Administered 2016-10-26: 300 mg via ORAL
  Filled 2016-10-26: qty 1

## 2016-10-26 MED ORDER — HYDROCOD POLST-CPM POLST ER 10-8 MG/5ML PO SUER
5.0000 mL | Freq: Once | ORAL | Status: AC
  Administered 2016-10-26: 5 mL via ORAL
  Filled 2016-10-26: qty 5

## 2016-10-26 MED ORDER — METHIMAZOLE 10 MG PO TABS
15.0000 mg | ORAL_TABLET | Freq: Two times a day (BID) | ORAL | Status: DC
  Administered 2016-10-26: 15 mg via ORAL
  Filled 2016-10-26 (×2): qty 1

## 2016-10-26 MED ORDER — FUROSEMIDE 10 MG/ML IJ SOLN
60.0000 mg | Freq: Three times a day (TID) | INTRAMUSCULAR | Status: DC
  Administered 2016-10-26: 60 mg via INTRAVENOUS
  Filled 2016-10-26: qty 6

## 2016-10-26 MED ORDER — DEXTROSE 5 % IV SOLN
500.0000 mg | INTRAVENOUS | Status: AC
  Administered 2016-10-26 – 2016-10-29 (×4): 500 mg via INTRAVENOUS
  Filled 2016-10-26 (×4): qty 500

## 2016-10-26 MED ORDER — METOPROLOL TARTRATE 50 MG PO TABS
50.0000 mg | ORAL_TABLET | Freq: Two times a day (BID) | ORAL | Status: DC
  Administered 2016-10-26: 50 mg via ORAL
  Filled 2016-10-26: qty 1

## 2016-10-26 MED ORDER — METOPROLOL TARTRATE 50 MG PO TABS: 75.0000 mg | ORAL_TABLET | Freq: Two times a day (BID) | ORAL | Status: DC

## 2016-10-26 MED ORDER — ENOXAPARIN SODIUM 40 MG/0.4ML ~~LOC~~ SOLN
40.0000 mg | SUBCUTANEOUS | Status: DC
  Administered 2016-10-26 – 2016-11-02 (×8): 40 mg via SUBCUTANEOUS
  Filled 2016-10-26 (×9): qty 0.4

## 2016-10-26 MED ORDER — DEXTROSE 5 % IV SOLN
1.0000 g | INTRAVENOUS | Status: DC
  Administered 2016-10-26: 1 g via INTRAVENOUS
  Filled 2016-10-26: qty 10

## 2016-10-26 MED ORDER — MUPIROCIN 2 % EX OINT
1.0000 "application " | TOPICAL_OINTMENT | Freq: Two times a day (BID) | CUTANEOUS | Status: AC
  Administered 2016-10-26 – 2016-10-30 (×10): 1 via NASAL
  Filled 2016-10-26 (×3): qty 22

## 2016-10-26 NOTE — Progress Notes (Signed)
Pt RSV test pending.  Pt placed on droplet precautions and educated.  Will monitor pt.

## 2016-10-26 NOTE — Progress Notes (Signed)
  Echocardiogram 2D Echocardiogram has been performed.  Nolon Rod 10/26/2016, 12:46 PM

## 2016-10-26 NOTE — Progress Notes (Signed)
Name: Tara [name] MRN: [id number] DOB: January 14, 1991    ADMISSION DATE:  10/25/2016 CONSULTATION DATE:  10/25/2016  REFERRING MD :  Dr. Montez Morita [facility]  CHIEF COMPLAINT:  SOB, cough  HISTORY OF PRESENT ILLNESS:  25 year old female with PMH as below, which is significant for recent pregnancy with healthy cesarean delivery. Pregnancy complicated by pre-eclampsia. Her post-partum course was complicated by pneumonia, but there is some thought that there may have been post-partum cardiomyopathy at that time as well. This is not supported by the EMR. She presented to [facility] ED initially 11/5 with complaints of SOB and productive cough. CXR was done demonstrating bilateral airspace opacifications read as pneumonia. She was given IM ceftriaxone, a Z-pack, and was discharged. She presented again to [facility] ED again 11/6 with similar complaints. CXR was again positive for bilateral opacifications. O2 sats were 85% on room air and she had increased work of breathing. Sats improved on 6L [place], but she continued to have dyspnea. There was concern for PE so she was pre-medicated due to iodine allergy. No scan at this time. Due to complexity and no improvement on outpatient antibiotics, PCCM consulted for further assistance.   She describes 3 days of worsening cough, pick/blood streak sputum, and dyspnea. Ankles swollen, however, this has been going on for some time. Endorses fevers/chills at home. Laboratory eval in ED significant for WBC only 10.3, Hgb 10.4, K 3.2, BNP 205.8.   SIGNIFICANT EVENTS  07/2016 delivery of healthy baby boy, post partum course complicated by PNA 11/5 ED for SOB, thought to be CAP and discharged with ABX 11/6 back to ED with worsening dyspnea.   STUDIES:  CTA chest 11/6 > negative for PE  SUBJECTIVE:  No events overnight.  Comfortable without BiPAP.  VITAL SIGNS: Temp:  [98.6 F (37 C)-100.9 F (38.3 C)] 98.6 F (37 C) (11/07 0423) Pulse Rate:  [46-127] 106 (11/07  0738) Resp:  [21-62] 51 (11/07 0738) BP: (123-166)/(53-104) 148/69 (11/07 0738) SpO2:  [82 %-100 %] 99 % (11/07 0738) Weight:  [114.9 kg (253 lb 3.2 oz)-122.5 kg (270 lb)] 114.9 kg (253 lb 3.2 oz) (11/07 0440)  PHYSICAL EXAMINATION: General:  Obese female in respiratory distress Neuro:  Alert, oriented, non-focal HEENT:  [place]/AT, PERRL, exophthalmus, no obvious JVD Cardiovascular:  Tachy, regular. +2 BLE edema Lungs:  Coarse crackles entire posterior chest Abdomen:  Soft, non-tender, non-distended Musculoskeletal:  No acute deformity Skin:  Grossly intact   Recent Labs Lab 10/24/16 1235 10/25/16 1851 10/26/16 0836  NA 141 140 141  K 3.4* 3.2* 3.8  CL 108 109 110  CO2 26 24 22   BUN <5* 9 10  CREATININE 0.56 0.65 0.57  GLUCOSE 105* 137* 170*    Recent Labs Lab 10/24/16 1235 10/25/16 1851 10/26/16 0836  HGB 10.9* 10.4* 10.5*  HCT 34.5* 32.5* 33.3*  WBC 10.3 10.3 9.3  PLT 251 234 239   Dg Chest 2 View  Result Date: 10/24/2016 CLINICAL DATA:  Chest pain. Dry cough. Short of breath since yesterday. Morbid obesity. EXAM: CHEST  2 VIEW COMPARISON:  08/29/2016 FINDINGS: Midline trachea. Cardiomegaly, accentuated by a mildly low lung volumes on the frontal radiograph. No pleural effusion or pneumothorax. Right greater than left, lower lobe predominant airspace disease. IMPRESSION: Bilateral airspace opacities, favoring pneumonia. Pulmonary edema felt less likely, given absence of pleural fluid. Cardiomegaly. Electronically Signed   By: Jeronimo Greaves M.D.   On: 10/24/2016 13:54   Ct Angio Chest Pe W And/or Wo  Contrast  Result Date: 10/25/2016 CLINICAL DATA:  Increasing shortness of breath for 3 days. Current treatment for pneumonia. EXAM: CT ANGIOGRAPHY CHEST WITH CONTRAST TECHNIQUE: Multidetector CT imaging of the chest was performed using the standard protocol during bolus administration of intravenous contrast. Multiplanar CT image reconstructions and MIPs were obtained to  evaluate the vascular anatomy. CONTRAST:  100 mL Isovue 370 COMPARISON:  04/13/2016 FINDINGS: Cardiovascular: Satisfactory opacification of the pulmonary arteries to the segmental level. No evidence of pulmonary embolism. Cardiac enlargement. Reflux of contrast material into the hepatic veins suggesting right heart failure. No pericardial effusion. Mediastinum/Nodes: No significant lymphadenopathy in the chest. Esophagus is decompressed. Diffuse enlargement of the thyroid gland without focal nodularity suggesting thyroid goiter. Lungs/Pleura: Diffuse airspace consolidation throughout both lungs. This is increasing since previous study. This could be due to multifocal pneumonia, edema, or ARDS. Upper Abdomen: No acute abnormality. Musculoskeletal: No chest wall abnormality. No acute or significant osseous findings. Review of the MIP images confirms the above findings. IMPRESSION: No evidence of significant pulmonary embolus. Diffuse airspace consolidation in both lungs may indicate edema, pneumonia, or ARDS. Cardiac enlargement with evidence of right heart failure. Electronically Signed   By: Burman NievesWilliam  Stevens M.D.   On: 10/25/2016 23:52   Dg Chest Port 1 View  Result Date: 10/25/2016 CLINICAL DATA:  Dyspnea. Diagnosed with pneumonia yesterday. Rapid shallow breathing. EXAM: PORTABLE CHEST 1 VIEW COMPARISON:  10/24/2016 FINDINGS: Lung volumes are low with relative increase in patchy bilateral airspace opacities more so at the lung bases consistent with pneumonic consolidations. No definite cephalization of pulmonary vessels to suggest CHF. Heart is enlarged but stable. No aortic aneurysm. Engorged appearing azygos vein may reflect increase in the patient's hydration status. No pleural effusion or pneumothorax. No suspicious osseous lesions. IMPRESSION: Patchy bilateral airspace opacities are again noted consistent pneumonic infiltrates more so at the lung bases. These appear slightly more confluent than on prior  exam. Electronically Signed   By: Tollie Ethavid  Kwon M.D.   On: 10/25/2016 19:43   I reviewed chest CT myself, no PE, infiltrate noted.  ASSESSMENT / PLAN:  Acute hypoxemic respiratory failure: true etiology unclear at this point. Pulmonary edema vs atypical pneumonia most likely. Some concern PE, however with bilateral infiltrates on CXR this is less likely. The history she gives raises concern for PNA, however, radiography and physical exam more coincide with pulmonary edema.   Pulmonary Edema - Keep O2 sats > 92% - D/C BiPAP - Additional dose of lasix 40mg  now with 40meq Kdur - Echocardiogram pending  CAP vs HCAP - Respiratory virus panel pending - Droplet precautions until RVP results. - Sputum culture pending - Urine strep and legionella antigens pending - Procalcitonin <0.10, would recommend d/c of abx at this point.  PE - No PE on CT.  Hyperthyroidism - TSH low today, there has been some question of her compliance in the past. History of thyroid storm.  - Assess free t3, t4  Hypoxemia: - Titrate O2 for sat of 92-95%. - Diureses as above.  Discussed with PCCM-NP and bedside RN.  PCCM will sign off, please call back if needed.  Alyson ReedyWesam G. Yacoub, M.D. [facility]. Pager: [phone]. After hours pager: [phone].

## 2016-10-26 NOTE — Progress Notes (Signed)
CRITICAL VALUE ALERT  Critical value received:  + MRSA PCR  Date of notification:  10/26/2016  Time of notification:  6:59 AM  Critical value read back:Yes.    Nurse who received alert:  Terrilyn Saver  MD notified (1st page):  Per protocol  Time of first page:  Per protocol  MD notified (2nd page):  Time of second page:  Responding MD:    Time MD responded:

## 2016-10-26 NOTE — Progress Notes (Signed)
Pt RR 35-40. Pt states she is anxious MD called with new orders placed.  2mg  ativan given IV with no relief. MD paged to come bedside new orders placed. MD at bedside to speak with patietn. Will continue to assess and monitor.

## 2016-10-26 NOTE — ED Notes (Signed)
Report attempted, RN unable to get report. 

## 2016-10-26 NOTE — Progress Notes (Signed)
MRSA PCR positive.  Contact precautions initiated and pt educated.  Will monitor pt.

## 2016-10-26 NOTE — Progress Notes (Addendum)
PROGRESS NOTE    Tara [name]  RJJ:[id number] DOB: 1991/08/03 DOA: 10/25/2016 PCP: No PCP Per Patient   Brief Narrative:  25 y.o. BF PMHx Graves' disease, HTN, Myocardial infarction,Chronic bronchitis , and recent Cesarean delivery complicated by post-partum HCAP (August)   Who presents to the ED for evaluation of fever, cough, and shortness of breath.  The patient was actually in the ED yesterday with similar complaints.  She did not have an oxygen requirement then.  Chest xray showed bilateral airspace opacities, favoring pneumonia.  She received a dose of IV Rocephin and was discharged with a prescription for a Z pack.  She comes back to the ED today because she has become significantly worse in the past 24 hours.  She was hypoxic to 85% upon arrival, and is requiring 6L [place] to maintain her O2 sats.  She has had chest pain/tightness today.  She is now producing a blood tinged sputum.  She reports fever to 105 at home with associated chills and sweats.  She denies sick contacts.  She takes an annual flu shot.   She is also complaining of pain and swelling in both legs.  She denies increased abdominal girth.  ED Course: Normal lactic acid level.  She is tachycardic and tachypneic, but she does not have hypotension.  She has received 2L of NS.  She has received IV Rocephin and azithromycin.  Chest xray again show patchy bilateral airspace opacities.  CTA chest ordered to rule out PE in the setting of her tachycardia and hypoxia; she is being pre-medicated with high dose IV solumedrol and IV benadryl per protocol.  Respiratory virus panel and influenza screen added to labs.  ABG shows pH 7.3 pCO2 46 pO2 52, but RT not certain that this is not a venous sample.  Upon my arrival to bedside, the patient looked critically ill with high potential for respiratory failure.  PCCM consulted immediately; prompt response greatly appreciated.   Subjective: 11/7  A/O 4,, negative CP, positive SOB. States  has increased her Methimazole she believes in June. Was on PTU at one time. During Her pregnancy they had spoken about removing her thyroid but deferred, secondary to pregnancy.   Assessment & Plan:   Principal Problem:   CAP (community acquired pneumonia) Active Problems:   Graves disease   Acute respiratory failure with hypoxia (HCC)   Sepsis (HCC)   Acute respiratory failure (HCC)   Sepsis due to pneumonia (HCC)   Dilated cardiomyopathy (HCC)   Pulmonary hypertension   Sepsis pneumonia/ CAP/ Acute Respiratory Failure with Hypoxia  -CTA chest consistent with pneumonia, pulmonary edema, and ARDS -Titrate O2 to maintain SPO2> 93% -Continuous pulse ox -Solu-Medrol 60 mg daily -Flutter valve -Continue seven-day course antibiotics --Blood cultures pending --Urine legionella and streptococcal antigens pending --Respiratory virus panel negative --HIV normal in July --PRN Morphine Air Hunger  Cardiomyopathy/Severe Pulmonary Hypertension -Strict in and out -Daily weight -Lasix IV 60 mg TID -Metoprolol 75 mg BID. Will DC labetalol as it has not been studied in heart failure -Large patient's BP overnight and if tolerates had lisinopril in the A.m. -Consulted cardiology: Patient will need to follow-up with Heart Failure team as outpatient  Graves' disease,  -undetectable TSH consistent with hyperthyroid state but clinical picture does not appear consistent with thyroid storm at this point (no refractory hyperpyrexia, mental status changes, hypotension). --Free T4 remains elevated. Review of patient's MAR shows that patient last had them resolved increased in September to 15 mg BID. Increased to  15 mg TID  Anxiety  -Ativan 2mg  TID    DVT prophylaxis: Lovenox Code Status: Full Family Communication: Family present Disposition Plan: ??   [facility]  [facility]    Procedures/Significant Events:  11/6 CT Angiogram Chest: Negative PE- Diffuse airspace consolidation in both  lungs may indicate edema, pneumonia, or ARDS. -Cardiac enlargement with Evidence of right heart failure 11/6 Echocardiogram:Left ventricle: mild LVH. -LVEF 60% to 65%. (Improved from 2/22 when EF was 40%)- Left atrium: mildly dilated. - Tricuspid valve: There was mild regurgitation. - Pulmonary arteries: PA peak pressure: 54 mm Hg (S).   Cultures 11/6 urine pending 11/6 blood pending 11/6 respiratory virus panel negative 11/7 blood 2 pending 11/7 MRSA by PCR positive    Antimicrobials: Azithromycin>> 11/6 Ceftriaxone>> 11/6   Devices    LINES / TUBES:      Continuous Infusions:   Objective: Vitals:   10/26/16 0440 10/26/16 0738 10/26/16 1142 10/26/16 1541  BP: 133/79 (!) 148/69 124/89 137/78  Pulse: (!) 103 (!) 106 99 (!) 117  Resp: (!) 24 (!) 51 (!) 30 (!) 27  Temp:      TempSrc:      SpO2: 96% 99% 97% 99%  Weight: 114.9 kg (253 lb 3.2 oz)     Height: 5\' 6"  (1.676 m)       Intake/Output Summary (Last 24 hours) at 10/26/16 1633 Last data filed at 10/25/16 2346  Gross per 24 hour  Intake             2350 ml  Output              850 ml  Net             1500 ml   Filed Weights   10/25/16 1837 10/25/16 1849 10/26/16 0440  Weight: 122.5 kg (270 lb) 122.5 kg (270 lb) 114.9 kg (253 lb 3.2 oz)    Examination:  General: A/O 4, positive Acute respiratory distress Eyes: negative scleral hemorrhage, negative anisocoria, negative icterus ENT: Negative Runny nose, negative gingival bleeding, Neck:  Negative scars, masses, torticollis, lymphadenopathy, JVD Lungs: views decreased air movement, negative wheezes, bibasilar crackles Cardiovascular: Tachycardic, Regular rhythm without murmur gallop or rub normal S1 and S2 Abdomen: negative abdominal pain, nondistended, positive soft, bowel sounds, no rebound, no ascites, no appreciable mass Extremities: No significant cyanosis, clubbing, or edema bilateral lower extremities Skin: Negative rashes, lesions,  ulcers Psychiatric:  Negative depression, negative anxiety, negative fatigue, negative mania  Central nervous system:  Cranial nerves II through XII intact, tongue/uvula midline, all extremities muscle strength 5/5, sensation intact throughout, negative dysarthria, negative expressive aphasia, negative receptive aphasia.  .     Data Reviewed: Care during the described time interval was provided by me .  I have reviewed this patient's available data, including medical history, events of note, physical examination, and all test results as part of my evaluation. I have personally reviewed and interpreted all radiology studies.  CBC:  Recent Labs Lab 10/24/16 1235 10/25/16 1851 10/26/16 0836  WBC 10.3 10.3 9.3  NEUTROABS 8.2* 7.8* 8.0*  HGB 10.9* 10.4* 10.5*  HCT 34.5* 32.5* 33.3*  MCV 69.1* 69.7* 70.3*  PLT 251 234 239   Basic Metabolic Panel:  Recent Labs Lab 10/24/16 1235 10/25/16 1851 10/26/16 0836  NA 141 140 141  K 3.4* 3.2* 3.8  CL 108 109 110  CO2 26 24 22   GLUCOSE 105* 137* 170*  BUN <5* 9 10  CREATININE 0.56 0.65 0.57  CALCIUM  9.3 8.7* 8.9  MG  --   --  2.0   GFR: Estimated Creatinine Clearance: 135.9 mL/min (by C-G formula based on SCr of 0.57 mg/dL). Liver Function Tests:  Recent Labs Lab 10/24/16 1235 10/25/16 1851 10/26/16 0836  AST 26 23 29   ALT 22 17 18   ALKPHOS 121 113 121  BILITOT 0.9 0.4 0.6  PROT 6.6 6.6 6.8  ALBUMIN 3.3* 3.0* 3.0*   No results for input(s): LIPASE, AMYLASE in the last 168 hours. No results for input(s): AMMONIA in the last 168 hours. Coagulation Profile: No results for input(s): INR, PROTIME in the last 168 hours. Cardiac Enzymes:  Recent Labs Lab 10/26/16 0624 10/26/16 1105  TROPONINI 0.03* <0.03   BNP (last 3 results) No results for input(s): PROBNP in the last 8760 hours. HbA1C: No results for input(s): HGBA1C in the last 72 hours. CBG: No results for input(s): GLUCAP in the last 168 hours. Lipid  Profile: No results for input(s): CHOL, HDL, LDLCALC, TRIG, CHOLHDL, LDLDIRECT in the last 72 hours. Thyroid Function Tests:  Recent Labs  10/25/16 1959 10/26/16 0624  TSH <0.010*  --   FREET4  --  4.77*   Anemia Panel: No results for input(s): VITAMINB12, FOLATE, FERRITIN, TIBC, IRON, RETICCTPCT in the last 72 hours. Urine analysis:    Component Value Date/Time   COLORURINE AMBER (A) 10/25/2016 1857   APPEARANCEUR CLEAR 10/25/2016 1857   LABSPEC 1.023 10/25/2016 1857   PHURINE 6.5 10/25/2016 1857   GLUCOSEU NEGATIVE 10/25/2016 1857   HGBUR NEGATIVE 10/25/2016 1857   BILIRUBINUR NEGATIVE 10/25/2016 1857   KETONESUR NEGATIVE 10/25/2016 1857   PROTEINUR 30 (A) 10/25/2016 1857   UROBILINOGEN 0.2 08/25/2016 1014   NITRITE NEGATIVE 10/25/2016 1857   LEUKOCYTESUR NEGATIVE 10/25/2016 1857   Sepsis Labs: @LABRCNTIP (procalcitonin:4,lacticidven:4)  ) Recent Results (from the past 240 hour(s))  Blood Culture (routine x 2)     Status: None (Preliminary result)   Collection Time: 10/25/16  6:51 PM  Result Value Ref Range Status   Specimen Description BLOOD RIGHT ANTECUBITAL  Final   Special Requests BOTTLES DRAWN AEROBIC AND ANAEROBIC  Final   Culture NO GROWTH < 24 HOURS  Final   Report Status PENDING  Incomplete  Blood Culture (routine x 2)     Status: None (Preliminary result)   Collection Time: 10/25/16  7:32 PM  Result Value Ref Range Status   Specimen Description BLOOD LEFT ARM  Final   Special Requests BOTTLES DRAWN AEROBIC AND ANAEROBIC 5CC  Final   Culture NO GROWTH < 24 HOURS  Final   Report Status PENDING  Incomplete  Respiratory Panel by PCR     Status: None   Collection Time: 10/25/16 11:06 PM  Result Value Ref Range Status   Adenovirus NOT DETECTED NOT DETECTED Final   Coronavirus 229E NOT DETECTED NOT DETECTED Final   Coronavirus HKU1 NOT DETECTED NOT DETECTED Final   Coronavirus NL63 NOT DETECTED NOT DETECTED Final   Coronavirus OC43 NOT DETECTED NOT  DETECTED Final   Metapneumovirus NOT DETECTED NOT DETECTED Final   Rhinovirus / Enterovirus NOT DETECTED NOT DETECTED Final   Influenza A NOT DETECTED NOT DETECTED Final   Influenza B NOT DETECTED NOT DETECTED Final   Parainfluenza Virus 1 NOT DETECTED NOT DETECTED Final   Parainfluenza Virus 2 NOT DETECTED NOT DETECTED Final   Parainfluenza Virus 3 NOT DETECTED NOT DETECTED Final   Parainfluenza Virus 4 NOT DETECTED NOT DETECTED Final   Respiratory Syncytial Virus NOT DETECTED  NOT DETECTED Final   Bordetella pertussis NOT DETECTED NOT DETECTED Final   Chlamydophila pneumoniae NOT DETECTED NOT DETECTED Final   Mycoplasma pneumoniae NOT DETECTED NOT DETECTED Final  MRSA PCR Screening     Status: Abnormal   Collection Time: 10/26/16  5:11 AM  Result Value Ref Range Status   MRSA by PCR POSITIVE (A) NEGATIVE Final    Comment:        The GeneXpert MRSA Assay (FDA approved for NASAL specimens only), is one component of a comprehensive MRSA colonization surveillance program. It is not intended to diagnose MRSA infection nor to guide or monitor treatment for MRSA infections. RESULT CALLED TO, READ BACK BY AND VERIFIED WITH: M HOPPER,RN  10/26/16 MKELLY,MLT          Radiology Studies: Ct Angio Chest Pe W And/or Wo Contrast  Result Date: 10/25/2016 CLINICAL DATA:  Increasing shortness of breath for 3 days. Current treatment for pneumonia. EXAM: CT ANGIOGRAPHY CHEST WITH CONTRAST TECHNIQUE: Multidetector CT imaging of the chest was performed using the standard protocol during bolus administration of intravenous contrast. Multiplanar CT image reconstructions and MIPs were obtained to evaluate the vascular anatomy. CONTRAST:  100 mL Isovue 370 COMPARISON:  04/13/2016 FINDINGS: Cardiovascular: Satisfactory opacification of the pulmonary arteries to the segmental level. No evidence of pulmonary embolism. Cardiac enlargement. Reflux of contrast material into the hepatic veins suggesting  right heart failure. No pericardial effusion. Mediastinum/Nodes: No significant lymphadenopathy in the chest. Esophagus is decompressed. Diffuse enlargement of the thyroid gland without focal nodularity suggesting thyroid goiter. Lungs/Pleura: Diffuse airspace consolidation throughout both lungs. This is increasing since previous study. This could be due to multifocal pneumonia, edema, or ARDS. Upper Abdomen: No acute abnormality. Musculoskeletal: No chest wall abnormality. No acute or significant osseous findings. Review of the MIP images confirms the above findings. IMPRESSION: No evidence of significant pulmonary embolus. Diffuse airspace consolidation in both lungs may indicate edema, pneumonia, or ARDS. Cardiac enlargement with evidence of right heart failure. Electronically Signed   By: Burman Nieves M.D.   On: 10/25/2016 23:52   Dg Chest Port 1 View  Result Date: 10/25/2016 CLINICAL DATA:  Dyspnea. Diagnosed with pneumonia yesterday. Rapid shallow breathing. EXAM: PORTABLE CHEST 1 VIEW COMPARISON:  10/24/2016 FINDINGS: Lung volumes are low with relative increase in patchy bilateral airspace opacities more so at the lung bases consistent with pneumonic consolidations. No definite cephalization of pulmonary vessels to suggest CHF. Heart is enlarged but stable. No aortic aneurysm. Engorged appearing azygos vein may reflect increase in the patient's hydration status. No pleural effusion or pneumothorax. No suspicious osseous lesions. IMPRESSION: Patchy bilateral airspace opacities are again noted consistent pneumonic infiltrates more so at the lung bases. These appear slightly more confluent than on prior exam. Electronically Signed   By: Tollie Eth M.D.   On: 10/25/2016 19:43        Scheduled Meds: . azithromycin  500 mg Intravenous Q24H  . cefTRIAXone (ROCEPHIN)  IV  1 g Intravenous Q24H  . Chlorhexidine Gluconate Cloth  6 each Topical Q0600  . enoxaparin (LOVENOX) injection  40 mg  Subcutaneous Q24H  . furosemide  40 mg Intravenous BID  . mouth rinse  15 mL Mouth Rinse BID  . methimazole  15 mg Oral TID  . methylPREDNISolone (SOLU-MEDROL) injection  60 mg Intravenous Q24H  . mupirocin ointment  1 application Nasal BID   Continuous Infusions:   LOS: 1 day    Time spent: 40 minutes    WOODS, CURTIS  J, MD [facility] Pager [phone]   If 7PM-7AM, please contact night-coverage [email or web address] Password [email or web address] 10/26/2016, 4:33 PM

## 2016-10-27 ENCOUNTER — Inpatient Hospital Stay (HOSPITAL_COMMUNITY): Payer: Medicaid Other

## 2016-10-27 DIAGNOSIS — I5021 Acute systolic (congestive) heart failure: Secondary | ICD-10-CM

## 2016-10-27 LAB — BASIC METABOLIC PANEL
ANION GAP: 9 (ref 5–15)
ANION GAP: 10 (ref 5–15)
BUN: 18 mg/dL (ref 6–20)
BUN: 16 mg/dL (ref 6–20)
CHLORIDE: 106 mmol/L (ref 101–111)
CO2: 25 mmol/L (ref 22–32)
CO2: 24 mmol/L (ref 22–32)
Calcium: 8.7 mg/dL — ABNORMAL LOW (ref 8.9–10.3)
Calcium: 8.7 mg/dL — ABNORMAL LOW (ref 8.9–10.3)
Chloride: 106 mmol/L (ref 101–111)
Creatinine, Ser: 0.68 mg/dL (ref 0.44–1.00)
Creatinine, Ser: 0.57 mg/dL (ref 0.44–1.00)
GFR calc Af Amer: >60 mL/min (ref >60)
GFR calc Af Amer: >60 mL/min (ref >60)
GFR calc non Af Amer: >60 mL/min (ref >60)
GLUCOSE: 128 mg/dL — AB (ref 65–99)
GLUCOSE: 115 mg/dL — AB (ref 65–99)
POTASSIUM: 4.5 mmol/L (ref 3.5–5.1)
POTASSIUM: 4.3 mmol/L (ref 3.5–5.1)
SODIUM: 140 mmol/L (ref 135–145)
Sodium: 140 mmol/L (ref 135–145)

## 2016-10-27 LAB — GLUCOSE, CAPILLARY
GLUCOSE-CAPILLARY: 129 mg/dL — AB (ref 65–99)
GLUCOSE-CAPILLARY: 117 mg/dL — AB (ref 65–99)
GLUCOSE-CAPILLARY: 137 mg/dL — AB (ref 65–99)
Glucose-Capillary: 125 mg/dL — ABNORMAL HIGH (ref 65–99)

## 2016-10-27 LAB — POCT I-STAT 3, ART BLOOD GAS (G3+)
ACID-BASE EXCESS: 1 mmol/L (ref 0.0–2.0)
Acid-Base Excess: 3 mmol/L — ABNORMAL HIGH (ref 0.0–2.0)
BICARBONATE: 26.5 mmol/L (ref 20.0–28.0)
Bicarbonate: 28.2 mmol/L — ABNORMAL HIGH (ref 20.0–28.0)
O2 SAT: 99 %
O2 Saturation: 99 %
PCO2 ART: 47.2 mmHg (ref 32.0–48.0)
PO2 ART: 146 mmHg — AB (ref 83.0–108.0)
Patient temperature: 99.7
TCO2: 28 mmol/L (ref 0–100)
TCO2: 30 mmol/L (ref 0–100)
pCO2 arterial: 44 mmHg (ref 32.0–48.0)
pH, Arterial: 7.387 (ref 7.350–7.450)
pH, Arterial: 7.387 (ref 7.350–7.450)
pO2, Arterial: 166 mmHg — ABNORMAL HIGH (ref 83.0–108.0)

## 2016-10-27 LAB — URINE CULTURE: Culture: NO GROWTH

## 2016-10-27 LAB — PROCALCITONIN: Procalcitonin: <0.1 ng/mL

## 2016-10-27 LAB — LACTIC ACID, PLASMA
LACTIC ACID, VENOUS: 1.3 mmol/L (ref 0.5–1.9)
LACTIC ACID, VENOUS: 1.1 mmol/L (ref 0.5–1.9)

## 2016-10-27 LAB — TRIGLYCERIDES: Triglycerides: 98 mg/dL (ref <150)

## 2016-10-27 LAB — CBC
HEMATOCRIT: 32.9 % — AB (ref 36.0–46.0)
Hemoglobin: 10.2 g/dL — ABNORMAL LOW (ref 12.0–15.0)
MCH: 22 pg — AB (ref 26.0–34.0)
MCHC: 31 g/dL (ref 30.0–36.0)
MCV: 70.9 fL — AB (ref 78.0–100.0)
Platelets: 256 10*3/uL (ref 150–400)
RBC: 4.64 MIL/uL (ref 3.87–5.11)
RDW: 15.9 % — AB (ref 11.5–15.5)
WBC: 16.6 10*3/uL — AB (ref 4.0–10.5)

## 2016-10-27 LAB — MAGNESIUM
Magnesium: 2.2 mg/dL (ref 1.7–2.4)
Magnesium: 2.2 mg/dL (ref 1.7–2.4)
Magnesium: 2.3 mg/dL (ref 1.7–2.4)

## 2016-10-27 LAB — SEDIMENTATION RATE: SED RATE: 55 mm/h — AB (ref 0–22)

## 2016-10-27 LAB — PHOSPHORUS
Phosphorus: 4.8 mg/dL — ABNORMAL HIGH (ref 2.5–4.6)
Phosphorus: 4 mg/dL (ref 2.5–4.6)
Phosphorus: 6.6 mg/dL — ABNORMAL HIGH (ref 2.5–4.6)

## 2016-10-27 LAB — HIV ANTIBODY (ROUTINE TESTING W REFLEX): HIV SCREEN 4TH GENERATION: NONREACTIVE

## 2016-10-27 LAB — C-REACTIVE PROTEIN: CRP: 1.6 mg/dL — ABNORMAL HIGH (ref <1.0)

## 2016-10-27 MED ORDER — FENTANYL CITRATE (PF) 100 MCG/2ML IJ SOLN
INTRAMUSCULAR | Status: AC
  Administered 2016-10-27: 100 ug
  Filled 2016-10-27: qty 2

## 2016-10-27 MED ORDER — METOLAZONE 5 MG PO TABS
5.0000 mg | ORAL_TABLET | Freq: Every day | ORAL | Status: AC
  Administered 2016-10-27: 5 mg via ORAL
  Filled 2016-10-27: qty 1

## 2016-10-27 MED ORDER — FUROSEMIDE 10 MG/ML IJ SOLN
40.0000 mg | Freq: Four times a day (QID) | INTRAMUSCULAR | Status: DC
  Administered 2016-10-27 (×2): 40 mg via INTRAVENOUS
  Filled 2016-10-27 (×2): qty 4

## 2016-10-27 MED ORDER — FAMOTIDINE IN NACL 20-0.9 MG/50ML-% IV SOLN
20.0000 mg | Freq: Two times a day (BID) | INTRAVENOUS | Status: DC
  Administered 2016-10-27 – 2016-10-28 (×4): 20 mg via INTRAVENOUS
  Filled 2016-10-27 (×4): qty 50

## 2016-10-27 MED ORDER — DOCUSATE SODIUM 50 MG/5ML PO LIQD: 100.0000 mg | Freq: Two times a day (BID) | ORAL | Status: DC | PRN

## 2016-10-27 MED ORDER — FENTANYL CITRATE (PF) 100 MCG/2ML IJ SOLN
100.0000 ug | INTRAMUSCULAR | Status: DC | PRN
  Administered 2016-10-27: 100 ug via INTRAVENOUS
  Filled 2016-10-27 (×2): qty 2

## 2016-10-27 MED ORDER — MIDAZOLAM HCL 2 MG/2ML IJ SOLN
INTRAMUSCULAR | Status: AC
Start: 2016-10-27 — End: 2016-10-27
  Administered 2016-10-27: 03:00:00
  Filled 2016-10-27: qty 2

## 2016-10-27 MED ORDER — NOREPINEPHRINE BITARTRATE 1 MG/ML IV SOLN
0.0000 ug/min | INTRAVENOUS | Status: DC
  Administered 2016-10-27: 5 ug/min via INTRAVENOUS
  Filled 2016-10-27: qty 16

## 2016-10-27 MED ORDER — ACETAMINOPHEN 325 MG PO TABS
650.0000 mg | ORAL_TABLET | ORAL | Status: DC | PRN
  Administered 2016-10-27 – 2016-10-29 (×3): 650 mg
  Filled 2016-10-27 (×3): qty 2

## 2016-10-27 MED ORDER — VANCOMYCIN HCL IN DEXTROSE 1-5 GM/200ML-% IV SOLN
1000.0000 mg | Freq: Three times a day (TID) | INTRAVENOUS | Status: DC
  Administered 2016-10-27 – 2016-10-28 (×4): 1000 mg via INTRAVENOUS
  Filled 2016-10-27 (×6): qty 200

## 2016-10-27 MED ORDER — PRO-STAT SUGAR FREE PO LIQD
60.0000 mL | Freq: Four times a day (QID) | ORAL | Status: DC
  Administered 2016-10-27 – 2016-10-31 (×18): 60 mL
  Filled 2016-10-27 (×19): qty 60

## 2016-10-27 MED ORDER — FENTANYL CITRATE (PF) 100 MCG/2ML IJ SOLN
INTRAMUSCULAR | Status: AC
  Filled 2016-10-27: qty 2

## 2016-10-27 MED ORDER — FENTANYL CITRATE (PF) 100 MCG/2ML IJ SOLN
INTRAMUSCULAR | Status: AC
  Administered 2016-10-27: 03:00:00
  Filled 2016-10-27: qty 2

## 2016-10-27 MED ORDER — PIPERACILLIN-TAZOBACTAM 3.375 G IVPB
3.3750 g | Freq: Three times a day (TID) | INTRAVENOUS | Status: DC
  Administered 2016-10-27 – 2016-10-28 (×4): 3.375 g via INTRAVENOUS
  Filled 2016-10-27 (×7): qty 50

## 2016-10-27 MED ORDER — FENTANYL CITRATE (PF) 100 MCG/2ML IJ SOLN
100.0000 ug | INTRAMUSCULAR | Status: DC | PRN
  Administered 2016-10-27: 100 ug via INTRAVENOUS

## 2016-10-27 MED ORDER — CHLORHEXIDINE GLUCONATE 0.12% ORAL RINSE (MEDLINE KIT)
15.0000 mL | Freq: Two times a day (BID) | OROMUCOSAL | Status: DC
  Administered 2016-10-27 – 2016-11-02 (×13): 15 mL via OROMUCOSAL

## 2016-10-27 MED ORDER — FENTANYL 2500MCG IN NS 250ML (10MCG/ML) PREMIX INFUSION
25.0000 ug/h | INTRAVENOUS | Status: DC
  Administered 2016-10-27: 300 ug/h via INTRAVENOUS
  Administered 2016-10-27: 50 ug/h via INTRAVENOUS
  Administered 2016-10-28 (×2): 200 ug/h via INTRAVENOUS
  Administered 2016-10-29 (×2): 250 ug/h via INTRAVENOUS
  Administered 2016-10-30: 300 ug/h via INTRAVENOUS
  Administered 2016-10-30: 50 ug/h via INTRAVENOUS
  Filled 2016-10-27 (×9): qty 250

## 2016-10-27 MED ORDER — PROPOFOL 1000 MG/100ML IV EMUL
INTRAVENOUS | Status: AC
  Administered 2016-10-27: 03:00:00
  Filled 2016-10-27: qty 100

## 2016-10-27 MED ORDER — VITAL HIGH PROTEIN PO LIQD
1000.0000 mL | ORAL | Status: DC
  Administered 2016-10-27: 1000 mL
  Administered 2016-10-27: 15:00:00
  Administered 2016-10-28: 1000 mL

## 2016-10-27 MED ORDER — MIDAZOLAM HCL 2 MG/2ML IJ SOLN
INTRAMUSCULAR | Status: AC
  Administered 2016-10-27: 03:00:00
  Filled 2016-10-27: qty 2

## 2016-10-27 MED ORDER — ORAL CARE MOUTH RINSE
15.0000 mL | OROMUCOSAL | Status: DC
  Administered 2016-10-27 – 2016-11-01 (×49): 15 mL via OROMUCOSAL

## 2016-10-27 MED ORDER — PROPOFOL 1000 MG/100ML IV EMUL
5.0000 ug/kg/min | INTRAVENOUS | Status: DC
  Administered 2016-10-27 (×2): 75 ug/kg/min via INTRAVENOUS
  Administered 2016-10-27 (×2): 65 ug/kg/min via INTRAVENOUS
  Administered 2016-10-27 (×4): 75 ug/kg/min via INTRAVENOUS
  Administered 2016-10-27: 30 ug/kg/min via INTRAVENOUS
  Administered 2016-10-27: 65 ug/kg/min via INTRAVENOUS
  Administered 2016-10-28: 55 ug/kg/min via INTRAVENOUS
  Administered 2016-10-28 (×3): 25 ug/kg/min via INTRAVENOUS
  Filled 2016-10-27 (×17): qty 100

## 2016-10-27 NOTE — Progress Notes (Addendum)
eLink Physician-Brief Progress Note Patient Name: Tara [name] DOB: 07-Jun-1991 MRN: [id number]   Date of Service  10/27/2016  HPI/Events of Note  Hypotension, severe ARDS  eICU Interventions  norepi infusion ordered. MAP goal 65 mmHg DC further diuresis DC metoprolol If tachycardia becomes problem, will change to phenylephrine     Intervention Category Major Interventions: Hypotension - evaluation and management  Merwyn Katos 10/27/2016, 9:34 PM

## 2016-10-27 NOTE — Progress Notes (Signed)
Pt desat to 82%, MD at bedside and ordered peep increased to 10.  Pt continued to desat 87%, fio2 increased to 50%, sat 95%.  RN at bedside and aware.

## 2016-10-27 NOTE — Progress Notes (Signed)
eLink Physician-Brief Progress Note Patient Name: Tara [name] DOB: Jul 24, 1991 MRN: [id number]   Date of Service  10/27/2016  HPI/Events of Note  Notified of need for Stress Ulcer Prophylaxis.   eICU Interventions  Will order Pepcid IV.      Intervention Category Intermediate Interventions: Best-practice therapies (e.g. DVT, beta blocker, etc.)  Sommer,Steven Eugene 10/27/2016, 6:17 AM

## 2016-10-27 NOTE — Progress Notes (Signed)
Name: Karel Jarvisieshia N Stahl MRN: [id number] DOB: 1991-08-15    ADMISSION DATE:  10/25/2016 CONSULTATION DATE:  10/25/2016  REFERRING MD :  Dr. Montez Moritaarter [facility]  CHIEF COMPLAINT:  SOB, cough  HISTORY OF PRESENT ILLNESS:  25 year old female with PMH as below, which is significant for recent pregnancy with healthy cesarean delivery. Pregnancy complicated by pre-eclampsia. Her post-partum course was complicated by pneumonia, but there is some thought that there may have been post-partum cardiomyopathy at that time as well. This is not supported by the EMR. [facility] with complaints of SOB and productive cough. CXR was done demonstrating bilateral airspace opacifications read as pneumonia. She was given IM ceftriaxone, a Z-pack, and was discharged. [facility] 11/6 with similar complaints. CXR was again positive for bilateral opacifications. O2 sats were 85% on room air and she had increased work of breathing. Sats improved on 6L [place], but she continued to have dyspnea. There was concern for PE so she was pre-medicated due to iodine allergy. No scan at this time. Due to complexity and no improvement on outpatient antibiotics, PCCM consulted for further assistance.   She describes 3 days of worsening cough, pick/blood streak sputum, and dyspnea. Ankles swollen, however, this has been going on for some time. Endorses fevers/chills at home. Laboratory eval in ED significant for WBC only 10.3, Hgb 10.4, K 3.2, BNP 205.8.   SIGNIFICANT EVENTS  07/2016 delivery of healthy baby boy, post partum course complicated by PNA 11/5 ED for SOB, thought to be CAP and discharged with ABX 11/6 back to ED with worsening dyspnea.   STUDIES:  CTA chest 11/6 > negative for PE  SUBJECTIVE:  Intubated overnight  VITAL SIGNS: Temp:  [98 F (36.7 C)-99.7 F (37.6 C)] 98.9 F (37.2 C) (11/08 0732) Pulse Rate:  [86-120] 87 (11/08 0825) Resp:  [21-60] 35 (11/08  0825) BP: (102-145)/(52-129) 113/53 (11/08 0825) SpO2:  [92 %-100 %] 96 % (11/08 0825) FiO2 (%):  [40 %-60 %] 40 % (11/08 0825) Weight:  [113.9 kg (251 lb 1.7 oz)] 113.9 kg (251 lb 1.7 oz) (11/08 0500)  PHYSICAL EXAMINATION: General:  Obese female in respiratory distress on vent Neuro:  [place], not following commands on propofol HEENT:  [place]/AT, PERRL, exophthalmus, no obvious JVD Cardiovascular:  Tachy, regular. +2 BLE edema Lungs:  Coarse crackles entire posterior chest Abdomen:  Soft, non-tender, non-distended Musculoskeletal:  No acute deformity Skin:  Grossly intact  Recent Labs Lab 10/24/16 1235 10/25/16 1851 10/26/16 0836  NA 141 140 141  K 3.4* 3.2* 3.8  CL 108 109 110  CO2 26 24 22   BUN <5* 9 10  CREATININE 0.56 0.65 0.57  GLUCOSE 105* 137* 170*   Recent Labs Lab 10/24/16 1235 10/25/16 1851 10/26/16 0836  HGB 10.9* 10.4* 10.5*  HCT 34.5* 32.5* 33.3*  WBC 10.3 10.3 9.3  PLT 251 234 239   Ct Angio Chest Pe W And/or Wo Contrast  Result Date: 10/25/2016 CLINICAL DATA:  Increasing shortness of breath for 3 days. Current treatment for pneumonia. EXAM: CT ANGIOGRAPHY CHEST WITH CONTRAST TECHNIQUE: Multidetector CT imaging of the chest was performed using the standard protocol during bolus administration of intravenous contrast. Multiplanar CT image reconstructions and MIPs were obtained to evaluate the vascular anatomy. CONTRAST:  100 mL Isovue 370 COMPARISON:  04/13/2016 FINDINGS: Cardiovascular: Satisfactory opacification of the pulmonary arteries to the segmental level. No evidence of pulmonary embolism. Cardiac enlargement. Reflux of  contrast material into the hepatic veins suggesting right heart failure. No pericardial effusion. Mediastinum/Nodes: No significant lymphadenopathy in the chest. Esophagus is decompressed. Diffuse enlargement of the thyroid gland without focal nodularity suggesting thyroid goiter. Lungs/Pleura: Diffuse airspace consolidation throughout both  lungs. This is increasing since previous study. This could be due to multifocal pneumonia, edema, or ARDS. Upper Abdomen: No acute abnormality. Musculoskeletal: No chest wall abnormality. No acute or significant osseous findings. Review of the MIP images confirms the above findings. IMPRESSION: No evidence of significant pulmonary embolus. Diffuse airspace consolidation in both lungs may indicate edema, pneumonia, or ARDS. Cardiac enlargement with evidence of right heart failure. Electronically Signed   By: Burman Nieves M.D.   On: 10/25/2016 23:52   Dg Chest Port 1 View  Result Date: 10/27/2016 CLINICAL DATA:  Respiratory failure.  Endotracheal tube placement. EXAM: PORTABLE CHEST 1 VIEW COMPARISON:  10/27/2016 FINDINGS: An endotracheal tube is been placed. Tip measures 4 mm above the carina. This measurement may be artifactually reduced due to shallow inspiration and AP technique. Shallow inspiration. Bilateral perihilar airspace consolidation is increasing since previous study. Cardiac enlargement. No pneumothorax. Enteric tube tip is off the field of view but below the left hemidiaphragm. IMPRESSION: Endotracheal tube tip is low, measuring only about 4 mm above the carina. Increasing perihilar consolidation in the lungs bilaterally. Electronically Signed   By: Burman Nieves M.D.   On: 10/27/2016 04:13   Dg Chest Port 1 View  Result Date: 10/27/2016 CLINICAL DATA:  Respiratory distress tonight EXAM: PORTABLE CHEST 1 VIEW COMPARISON:  10/26/2016 FINDINGS: Shallow inspiration. Cardiac enlargement. Bilateral perihilar airspace disease, greater on the right. Changes suggest pneumonia. Similar appearance to previous study. No pleural effusions. No pneumothorax. Mediastinal contours appear intact. IMPRESSION: Bilateral perihilar infiltrates, greater on the right, suggesting pneumonia. Similar appearance to previous study. Electronically Signed   By: Burman Nieves M.D.   On: 10/27/2016 01:50   Dg  Chest Port 1 View  Result Date: 10/26/2016 CLINICAL DATA:  Shortness of breath and chest pain for 2 days. EXAM: PORTABLE CHEST 1 VIEW COMPARISON:  10/25/2016 FINDINGS: The cardiac silhouette remains mildly enlarged. The lungs remain hypoinflated. There is increasing airspace consolidation in the right perihilar region. Patchy left lung opacities have not significantly changed. No sizable pleural effusion or pneumothorax is identified. No acute osseous abnormality is seen. IMPRESSION: Bilateral airspace opacities with interval worsening on the right, concerning for pneumonia. Electronically Signed   By: Sebastian Ache M.D.   On: 10/26/2016 19:42   Dg Chest Port 1 View  Result Date: 10/25/2016 CLINICAL DATA:  Dyspnea. Diagnosed with pneumonia yesterday. Rapid shallow breathing. EXAM: PORTABLE CHEST 1 VIEW COMPARISON:  10/24/2016 FINDINGS: Lung volumes are low with relative increase in patchy bilateral airspace opacities more so at the lung bases consistent with pneumonic consolidations. No definite cephalization of pulmonary vessels to suggest CHF. Heart is enlarged but stable. No aortic aneurysm. Engorged appearing azygos vein may reflect increase in the patient's hydration status. No pleural effusion or pneumothorax. No suspicious osseous lesions. IMPRESSION: Patchy bilateral airspace opacities are again noted consistent pneumonic infiltrates more so at the lung bases. These appear slightly more confluent than on prior exam. Electronically Signed   By: Tollie Eth M.D.   On: 10/25/2016 19:43   Dg Abd Portable 1v  Result Date: 10/27/2016 CLINICAL DATA:  NG tube placement EXAM: PORTABLE ABDOMEN - 1 VIEW COMPARISON:  None. FINDINGS: Enteric tube tip is in the left upper quadrant consistent with location in  the upper stomach. Scattered gas and stool in the colon without distention. IMPRESSION: Enteric tube tip is in the left upper quadrant consistent with location in the upper stomach. Electronically Signed    By: Burman Nieves M.D.   On: 10/27/2016 04:10   I reviewed chest CT myself, no PE, infiltrate noted.  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  ASSESSMENT / PLAN:  PULMONARY A: Acute pulmonary edema leading to respiratory failure, procalcitonin <0.1, afebrile, unlikely infection, cultures sent. P:   - Full vent support - Decrease PEEP to 5. 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 - Auto-immune work up for pulmonary HTN - No PE noted  RENAL A:   No active issues P:   - Lasix 40 mg IV q6 x3 doses. - Zaroxolyn 5 mg PO x1. - Potassium replacement. - BMET in AM.  GASTROINTESTINAL A:   No active issues P:   - TF per nutrition.  HEMATOLOGIC A:   Leukocytosis, likely a stress response P:  - CBC in AM  INFECTIOUS A:   Doubt PNA P:   - F/U cultures, if negative in AM will d/c abx.  ENDOCRINE A:   No active issues   P:   - Monitor  NEUROLOGIC A:   Severe anxiety P:   RASS goal: 0 to -1 - Propofol drip - Fentanyl drip. - Will need benzos when extubated.  FAMILY  - Updates: No family bedside  - Inter-disciplinary family meet or Palliative Care meeting due by:    The patient is critically ill with multiple organ systems failure and requires high complexity decision making for assessment and support, frequent evaluation and titration of therapies, application of advanced monitoring technologies and extensive interpretation of multiple databases.   Critical Care Time devoted to patient care services described in this note is  45   Minutes. This time reflects time of care of this signee Dr Koren Bound. This critical care time does not reflect procedure time, or teaching time or supervisory time of PA/NP/Med student/Med Resident etc but could involve care discussion time.  Alyson Reedy, M.D. [facility] Pulmonary/Critical Care Medicine. Pager: [phone]. After hours pager: [phone].  10/27/2016, 9:25 AM

## 2016-10-27 NOTE — Progress Notes (Signed)
Initial Nutrition Assessment  DOCUMENTATION CODES:   Morbid obesity  INTERVENTION:   Vital High Protein @ 5 ml/hr (120 ml/day) 60 ml Prostat QID  Provides: 920 kcal, 130 grams protein, and 100 ml H2O.  TF regimen and propofol at current rate providing 2284 total kcal/day   NUTRITION DIAGNOSIS:   Inadequate oral intake related to inability to eat as evidenced by NPO status.  GOAL:   Provide needs based on ASPEN/SCCM guidelines  MONITOR:   TF tolerance, Vent status, Weight trends, I & O's  REASON FOR ASSESSMENT:   Consult Enteral/tube feeding initiation and management  ASSESSMENT:   Pt with hx of recent pregnancy with healthy cesarean delivery 07/2016, 11/5 in ED with suspected PNA now admitted with dyspnea requiring intubation for respiratory failure from pulmonary edema.    No family in the room. Spoke with RN, pt on high rate of propofol and has needed that volume.   Patient is currently intubated on ventilator support Temp (24hrs), Avg:98.7 F (37.1 C), Min:98 F (36.7 C), Max:99.7 F (37.6 C)  Propofol: 51.7 ml/hr provides: 1364 kcal per day from lipid Medications reviewed and include: lasix Labs reviewed: TG: 99 Nutrition-Focused physical exam completed. Findings are no fat depletion, no muscle depletion, and no edema.  OG tube with tip in upper stomach   Diet Order:  Diet regular Room service appropriate? Yes; Fluid consistency: Thin  Skin:  Reviewed, no issues (abdominal incision)  Last BM:  11/7  Height:   Ht Readings from Last 1 Encounters:  10/26/16 5\' 6"  (1.676 m)    Weight:   Wt Readings from Last 1 Encounters:  10/27/16 251 lb 1.7 oz (113.9 kg)    Ideal Body Weight:  59 kg  BMI:  Body mass index is 40.53 kg/m.  Estimated Nutritional Needs:   Kcal:  8185-6314  Protein:  >/= 147 grams   Fluid:  >1.5 L/day  EDUCATION NEEDS:   No education needs identified at this time  Kendell Bane RD, LDN, CNSC [phone] Pager [phone]  After Hours Pager

## 2016-10-27 NOTE — Progress Notes (Signed)
Pharmacy Antibiotic Note  Tara [name] is a 25 y.o. female admitted on 10/25/2016 with pneumonia.  Pharmacy has been consulted for Vancomycin/Zosyn dosing. Broadening from Ceftriaxone/Azithromycin due to persistent tachypnea requiring intubation.   Plan: -Vancomycin 1000 mg IV q8h -Zosyn 3.375G IV q8h to be infused over 4 hours -Trend WBC, temp, renal function  -Drug levels at steady state  Height: 5\' 6"  (167.6 cm) Weight: 253 lb 3.2 oz (114.9 kg) IBW/kg (Calculated) : 59.3  Temp (24hrs), Avg:98.3 F (36.8 C), Min:98 F (36.7 C), Max:98.6 F (37 C)   Recent Labs Lab 10/24/16 1235 10/24/16 1258 10/25/16 1851 10/25/16 1859 10/26/16 0624 10/26/16 0836  WBC 10.3  --  10.3  --   --  9.3  CREATININE 0.56  --  0.65  --   --  0.57  LATICACIDVEN  --  0.96  --  1.21 1.5  --     Estimated Creatinine Clearance: 135.9 mL/min (by C-G formula based on SCr of 0.57 mg/dL).    Allergies  Allergen Reactions  . Iodine Hives  . Shellfish Allergy Other (See Comments)    Abran Duke 10/27/2016 3:50 AM

## 2016-10-27 NOTE — Progress Notes (Signed)
MD notified again about pt's increased HR.  Temperature 99.5 oral.  Will continue to monitor closely and update as needed.  No family in room.  Lights turned off to cut down on stimulation.

## 2016-10-27 NOTE — Progress Notes (Signed)
Pt resp. In 50s uncontrolled by BiPap. [employer] ordered intubation. During intubation pt given 100 mg fentanyl, 3 versed, 20mg  Etomidate. 60mg  Succinylcholine. Pt tolerated intubation and has been moved to ICU.

## 2016-10-27 NOTE — Consult Note (Addendum)
Advanced Heart Failure Team Consult Note  Referring Physician: Dr. Corrie Dandy Primary Physician: None per patient.  Primary Cardiologist:  None  Reason for Consultation: ? Post-partum cardiomyopathy vs Diastolic HF  HPI:    Pt intubated and sedated at time of note HPI and ROS obtained from chart  Tara [name] is a 25 y.o. female with history of Graves' disease, HTN, and s/p Cesarean delivery 53/2992 complicated by post-partum HCAP.    Presented to [facility] 10/25/16 with c/o of fever, cough, and shortness of breath. Was seen in ED 10/24/16 for same. CXR with bilateral airspace opacities favoring PNA that day, so given dose of IV rocephin and sent home with Z-pack. Returned 10/25/16 as above "feeling much worse". Hypoxic to 85% upon arrival and required 6L [place] to maintain 02. + CP/tightness. Productive cough with blood tinge sputum and fever up to 105 F at home.   Repeat CXR showed patchy bilateral airspace opacities. Chest CTA without PE. Diffuse airspace consolidation concerning for edema, PNA, or ARDs. Cardiac enlargement with R HF.   Flu and Viral panels negative. Pertinent labs on admission include K 3.4, Creatinine 0.56, BNP 227.6, Troponin negative, Lactic acid WNL, procalcitonin < 0.10. WBC 10 on admission, now up to 16.  Blood, sputum, and urine cultures pending. PCCM consulted with complexity and no improvement despite outpatient ABX.  AHF team consulted with possible involvement of peri-partum cardiomyopathy.     Echo 10/26/16 LVEF 60-65%, Mild AS, mild LAE, Mild TI, PA peak pressure 54 mm Hg. Normal RV  Pt intubated early this am  with anxiety and pulmonary edema (tachypnea >40). Currently diuresing on IV lasix 40 mg IV q 6 hrs. Given dose of metolazone today. I/O positive thus far.   Broad spectrum ABX with Vanc + Zosyn.     Previous Echos Echo 02/11/16 LVEF 45-50%, mildly dilated LV, Mild MR, Mod LAE Echo 04/16/16 LVEF 55-60%, Trivial AI  Review of Systems: [y] = yes, '[ ]'$  = no    General: Weight gain Blue.Reese ]; Weight loss '[ ]'$ ; Anorexia '[ ]'$ ; Fatigue [y]; Fever [y]; Chills '[ ]'$ ; Weakness '[ ]'$   Cardiac: Chest pain/pressure [y]; Resting SOB [y]; Exertional SOB [y]; Orthopnea '[ ]'$ ; Pedal Edema '[ ]'$ ; Palpitations '[ ]'$ ; Syncope '[ ]'$ ; Presyncope '[ ]'$ ; Paroxysmal nocturnal dyspnea'[ ]'$   Pulmonary: Cough [y]; Wheezing'[ ]'$ ; Hemoptysis[y]; Sputum [y]; Snoring '[ ]'$   GI: Vomiting'[ ]'$ ; Dysphagia'[ ]'$ ; Melena'[ ]'$ ; Hematochezia '[ ]'$ ; Heartburn'[ ]'$ ; Abdominal pain '[ ]'$ ; Constipation '[ ]'$ ; Diarrhea '[ ]'$ ; BRBPR '[ ]'$   GU: Hematuria'[ ]'$ ; Dysuria '[ ]'$ ; Nocturia'[ ]'$   Vascular: Pain in legs with walking '[ ]'$ ; Pain in feet with lying flat '[ ]'$ ; Non-healing sores '[ ]'$ ; Stroke '[ ]'$ ; TIA '[ ]'$ ; Slurred speech '[ ]'$ ;  Neuro: Headaches'[ ]'$ ; Vertigo'[ ]'$ ; Seizures'[ ]'$ ; Paresthesias'[ ]'$ ;Blurred vision '[ ]'$ ; Diplopia '[ ]'$ ; Vision changes '[ ]'$   Ortho/Skin: Arthritis '[ ]'$ ; Joint pain '[ ]'$ ; Muscle pain '[ ]'$ ; Joint swelling '[ ]'$ ; Back Pain '[ ]'$ ; Rash '[ ]'$   Psych: Depression'[ ]'$ ; Anxiety'[ ]'$   Heme: Bleeding problems '[ ]'$ ; Clotting disorders '[ ]'$ ; Anemia '[ ]'$   Endocrine: Diabetes '[ ]'$ ; Thyroid dysfunction[y ]  Home Medications Prior to Admission medications   Medication Sig Start Date End Date Taking? Authorizing Provider  azithromycin (ZITHROMAX) 250 MG tablet Take 1 tab PO daily Patient taking differently: Take 250 mg by mouth daily. Take 1 tab PO daily 10/24/16  Yes Tatyana Kirichenko, PA-C  hydrochlorothiazide (HYDRODIURIL) 25  MG tablet Take 1 tablet (25 mg total) by mouth daily. 07/26/16  Yes Manya Silvas, CNM  labetalol (NORMODYNE) 300 MG tablet Take 1 tablet (300 mg total) by mouth 2 (two) times daily. 10/24/16  Yes Tatyana Kirichenko, PA-C  methimazole (TAPAZOLE) 5 MG tablet Take 3 tablets (15 mg total) by mouth 2 (two) times daily. 10/24/16  Yes Jeannett Senior, PA-C    Past Medical History: Past Medical History:  Diagnosis Date  . Anemia   . CAP (community acquired pneumonia) 02/13/2016  . Chronic bronchitis ([place])   . Graves disease   . Graves  disease   . Headache   . Hypertension   . Myocardial infarction 2013  . Obesity   . Pneumonia "several times"    Past Surgical History: Past Surgical History:  Procedure Laterality Date  . CESAREAN SECTION    . CESAREAN SECTION N/A 07/21/2016   Procedure: CESAREAN SECTION;  Surgeon: Guss Bunde, MD;  Location: [facility];  Service: Obstetrics;  Laterality: N/A;  . EYE SURGERY Bilateral 2014   "for Graves Disease; eyelid retraction on left; decompression on the right""    Family History: No family history on file.  Social History: Social History   Social History  . Marital status: Single    Spouse name: N/A  . Number of children: N/A  . Years of education: N/A   Social History Main Topics  . Smoking status: Never Smoker  . Smokeless tobacco: Never Used  . Alcohol use No  . Drug use: No  . Sexual activity: Not Currently   Other Topics Concern  . None   Social History Narrative  . None    Allergies:  Allergies  Allergen Reactions  . Iodine Hives  . Shellfish Allergy Other (See Comments)    Objective:    Vital Signs:   Temp:  [98 F (36.7 C)-99.7 F (37.6 C)] 98.9 F (37.2 C) (11/08 0732) Pulse Rate:  [86-120] 87 (11/08 0825) Resp:  [21-60] 35 (11/08 0825) BP: (102-145)/(52-129) 113/53 (11/08 0825) SpO2:  [92 %-100 %] 96 % (11/08 0825) FiO2 (%):  [40 %-60 %] 40 % (11/08 0825) Weight:  [251 lb 1.7 oz (113.9 kg)] 251 lb 1.7 oz (113.9 kg) (11/08 0500) Last BM Date: 10/26/16  Weight change: Filed Weights   10/26/16 0440 10/27/16 0413 10/27/16 0500  Weight: 253 lb 3.2 oz (114.9 kg) 251 lb 1.7 oz (113.9 kg) 251 lb 1.7 oz (113.9 kg)    Intake/Output:   Intake/Output Summary (Last 24 hours) at 10/27/16 1020 Last data filed at 10/27/16 0800  Gross per 24 hour  Intake           959.33 ml  Output             1425 ml  Net          -465.67 ml     Physical Exam: General:  Obese. Intubated and Sedated. HEENT: Normal  Neck: supple. JVP to  jaw. Carotids 2+ bilat; no bruits. No lymphadenopathy. Thyroid diffusely enlarged.  Cor: PMI nondisplaced. Regular, slightly tachy. No M/G/R appreciated.  Lungs: Diminished, mechanical breathing sounds.  Abdomen: Obese, soft, NT, ND, no HSM. No bruits or masses. +BS  Extremities: no cyanosis, clubbing, rash. 2+ edema to knee.   Neuro: Intubated and sedated.  Telemetry: Reviewed, NSR to Sinus TAch  Labs: Basic Metabolic Panel:  Recent Labs Lab 10/24/16 1235 10/25/16 1851 10/26/16 0836 10/27/16 0850  NA 141 140 141 140  K 3.4* 3.2* 3.8  4.5  CL 108 109 110 106  CO2 '26 24 22 25  '$ GLUCOSE 105* 137* 170* 128*  BUN <5* '9 10 18  '$ CREATININE 0.56 0.65 0.57 0.68  CALCIUM 9.3 8.7* 8.9 8.7*  MG  --   --  2.0 2.2  PHOS  --   --   --  4.8*    Liver Function Tests:  Recent Labs Lab 10/24/16 1235 10/25/16 1851 10/26/16 0836  AST '26 23 29  '$ ALT '22 17 18  '$ ALKPHOS 121 113 121  BILITOT 0.9 0.4 0.6  PROT 6.6 6.6 6.8  ALBUMIN 3.3* 3.0* 3.0*   No results for input(s): LIPASE, AMYLASE in the last 168 hours. No results for input(s): AMMONIA in the last 168 hours.  CBC:  Recent Labs Lab 10/24/16 1235 10/25/16 1851 10/26/16 0836 10/27/16 0850  WBC 10.3 10.3 9.3 16.6*  NEUTROABS 8.2* 7.8* 8.0*  --   HGB 10.9* 10.4* 10.5* 10.2*  HCT 34.5* 32.5* 33.3* 32.9*  MCV 69.1* 69.7* 70.3* 70.9*  PLT 251 234 239 256    Cardiac Enzymes:  Recent Labs Lab 10/26/16 0624 10/26/16 1105  TROPONINI 0.03* <0.03    BNP: BNP (last 3 results)  Recent Labs  08/29/16 1018 10/24/16 1403 10/26/16 0624  BNP 15.2 205.8* 227.6*    ProBNP (last 3 results) No results for input(s): PROBNP in the last 8760 hours.   CBG: No results for input(s): GLUCAP in the last 168 hours.  Coagulation Studies: No results for input(s): LABPROT, INR in the last 72 hours.  Other results: EKG: 10/26/16 Sinus tach 117  Imaging: Ct Angio Chest Pe W And/or Wo Contrast  Result Date: 10/25/2016 CLINICAL  DATA:  Increasing shortness of breath for 3 days. Current treatment for pneumonia. EXAM: CT ANGIOGRAPHY CHEST WITH CONTRAST TECHNIQUE: Multidetector CT imaging of the chest was performed using the standard protocol during bolus administration of intravenous contrast. Multiplanar CT image reconstructions and MIPs were obtained to evaluate the vascular anatomy. CONTRAST:  100 mL Isovue 370 COMPARISON:  04/13/2016 FINDINGS: Cardiovascular: Satisfactory opacification of the pulmonary arteries to the segmental level. No evidence of pulmonary embolism. Cardiac enlargement. Reflux of contrast material into the hepatic veins suggesting right heart failure. No pericardial effusion. Mediastinum/Nodes: No significant lymphadenopathy in the chest. Esophagus is decompressed. Diffuse enlargement of the thyroid gland without focal nodularity suggesting thyroid goiter. Lungs/Pleura: Diffuse airspace consolidation throughout both lungs. This is increasing since previous study. This could be due to multifocal pneumonia, edema, or ARDS. Upper Abdomen: No acute abnormality. Musculoskeletal: No chest wall abnormality. No acute or significant osseous findings. Review of the MIP images confirms the above findings. IMPRESSION: No evidence of significant pulmonary embolus. Diffuse airspace consolidation in both lungs may indicate edema, pneumonia, or ARDS. Cardiac enlargement with evidence of right heart failure. Electronically Signed   By: Lucienne Capers M.D.   On: 10/25/2016 23:52   Dg Chest Port 1 View  Result Date: 10/27/2016 CLINICAL DATA:  Respiratory failure.  Endotracheal tube placement. EXAM: PORTABLE CHEST 1 VIEW COMPARISON:  10/27/2016 FINDINGS: An endotracheal tube is been placed. Tip measures 4 mm above the carina. This measurement may be artifactually reduced due to shallow inspiration and AP technique. Shallow inspiration. Bilateral perihilar airspace consolidation is increasing since previous study. Cardiac  enlargement. No pneumothorax. Enteric tube tip is off the field of view but below the left hemidiaphragm. IMPRESSION: Endotracheal tube tip is low, measuring only about 4 mm above the carina. Increasing perihilar consolidation in the lungs bilaterally. Electronically  Signed   By: Lucienne Capers M.D.   On: 10/27/2016 04:13   Dg Chest Port 1 View  Result Date: 10/27/2016 CLINICAL DATA:  Respiratory distress tonight EXAM: PORTABLE CHEST 1 VIEW COMPARISON:  10/26/2016 FINDINGS: Shallow inspiration. Cardiac enlargement. Bilateral perihilar airspace disease, greater on the right. Changes suggest pneumonia. Similar appearance to previous study. No pleural effusions. No pneumothorax. Mediastinal contours appear intact. IMPRESSION: Bilateral perihilar infiltrates, greater on the right, suggesting pneumonia. Similar appearance to previous study. Electronically Signed   By: Lucienne Capers M.D.   On: 10/27/2016 01:50   Dg Chest Port 1 View  Result Date: 10/26/2016 CLINICAL DATA:  Shortness of breath and chest pain for 2 days. EXAM: PORTABLE CHEST 1 VIEW COMPARISON:  10/25/2016 FINDINGS: The cardiac silhouette remains mildly enlarged. The lungs remain hypoinflated. There is increasing airspace consolidation in the right perihilar region. Patchy left lung opacities have not significantly changed. No sizable pleural effusion or pneumothorax is identified. No acute osseous abnormality is seen. IMPRESSION: Bilateral airspace opacities with interval worsening on the right, concerning for pneumonia. Electronically Signed   By: Logan Bores M.D.   On: 10/26/2016 19:42   Dg Chest Port 1 View  Result Date: 10/25/2016 CLINICAL DATA:  Dyspnea. Diagnosed with pneumonia yesterday. Rapid shallow breathing. EXAM: PORTABLE CHEST 1 VIEW COMPARISON:  10/24/2016 FINDINGS: Lung volumes are low with relative increase in patchy bilateral airspace opacities more so at the lung bases consistent with pneumonic consolidations. No definite  cephalization of pulmonary vessels to suggest CHF. Heart is enlarged but stable. No aortic aneurysm. Engorged appearing azygos vein may reflect increase in the patient's hydration status. No pleural effusion or pneumothorax. No suspicious osseous lesions. IMPRESSION: Patchy bilateral airspace opacities are again noted consistent pneumonic infiltrates more so at the lung bases. These appear slightly more confluent than on prior exam. Electronically Signed   By: Ashley Royalty M.D.   On: 10/25/2016 19:43   Dg Abd Portable 1v  Result Date: 10/27/2016 CLINICAL DATA:  NG tube placement EXAM: PORTABLE ABDOMEN - 1 VIEW COMPARISON:  None. FINDINGS: Enteric tube tip is in the left upper quadrant consistent with location in the upper stomach. Scattered gas and stool in the colon without distention. IMPRESSION: Enteric tube tip is in the left upper quadrant consistent with location in the upper stomach. Electronically Signed   By: Lucienne Capers M.D.   On: 10/27/2016 04:10      Medications:     Current Medications: . azithromycin  500 mg Intravenous Q24H  . chlorhexidine gluconate (MEDLINE KIT)  15 mL Mouth Rinse BID  . Chlorhexidine Gluconate Cloth  6 each Topical Q0600  . enoxaparin (LOVENOX) injection  40 mg Subcutaneous Q24H  . famotidine (PEPCID) IV  20 mg Intravenous Q12H  . furosemide  40 mg Intravenous Q6H  . mouth rinse  15 mL Mouth Rinse 10 times per day  . methimazole  15 mg Oral TID  . metolazone  5 mg Oral Daily  . metoprolol tartrate  75 mg Oral BID  . mupirocin ointment  1 application Nasal BID  . piperacillin-tazobactam (ZOSYN)  IV  3.375 g Intravenous Q8H  . vancomycin  1,000 mg Intravenous Q8H     Infusions: . fentaNYL infusion INTRAVENOUS    . propofol (DIPRIVAN) infusion 75 mcg/kg/min (10/27/16 0920)      Assessment/Plan   Tara [name] is a 25 y.o. female with history of graves diease, HTN, s/p C-section 01/253 complicated by post-partum HCAP, pregnancy complicated  by  pre-eclampsia. Treated for CAP in ED 10/24/16 and sent home.  Returned to ED and admitted 10/25/16 with acute respiratory failure with hypoxia.  Intubated early morning 10/27/16 with pulmonary edema and anxiety.  1. Acute respiratory failure s/p intubation 10/27/16 - Remains on full vent support - Diuresing well so far. - Blood Cx NGTD. Respiratory culture inconclusive.  If negative in am plan to stop ABX per primary.  - WBC up today, possibly stress response.  2. Acute on chronic diastolic CHF - Echo 46/6/59 LVEF 60-65%, Mild AS, mild LAE, Mild TI, PA peak pressure 54 mm Hg. Normal RV - She does have significant volume overload on exam.   - Continue to diurese with Lasix 40 mg IV q 6 hrs. Watch renal function closely.  - ? Etiology. Could be do to poorly controlled hyperthyroidism vs undiagnosed OSA/OHS with body habitus vs poorly controlled HTN  Family not available for history.  3. Graves disease - On methimazole and BB with tachycardia.  - TSH < 0.010, T4 4.77 4. Severe anxiety - Per PCCM/IM  Agree with diuresis.  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  Agree with serologies for PAH/Diastolic CHF.   We will follow along with you.   Length of Stay: 2  Shirley Friar PA-C 10/27/2016, 10:20 AM  Advanced Heart Failure Team Pager [phone] (M-F; 7a - 4p)  Please contact [facility] for night-coverage after hours (4p -7a ) and weekends on [email or web address]  Patient seen and examined with Oda Kilts, PA-C. We discussed all aspects of the encounter. I agree with the assessment and plan as stated above.   She has severe diastolic HF with marked volume overload and respiratory failure. Echo reviewed personally and LV and RV are normal. Continue aggressive diuresis. PA pressures will likely get better with diuresis.   Situation complicated by hyperthyroidism but HR is currently controlled. Continue b-blocker and methimazole.   Case d/w Dr. Nelda Marseille at bedside.    Raeya Merritts,MD 3:10 PM

## 2016-10-27 NOTE — Progress Notes (Signed)
eLink Physician-Brief Progress Note Patient Name: Tara [name] DOB: Sep 08, 1991 MRN: [id number]   Date of Service  10/27/2016  HPI/Events of Note  Hypoxia - CXR with bilateral pulmonary infiltrates. LVEF by echo 10/26/2016 = 60% to 65%. Already being diuresed with Lasix 60 mg IV Q 8 hours.   eICU Interventions  Will order: 1. Will retry BiPAP. If she doesn't tolerate BiPAP or she does not decrease her RR, she may need intubation.  2. Ground coverage APP notified and request to evaluate the patient at bedside.      Intervention Category Major Interventions: Hypoxemia - evaluation and management  Sommer,Steven Eugene 10/27/2016, 1:17 AM

## 2016-10-27 NOTE — Procedures (Signed)
Intubation Procedure Note Tara [name] [id number] 09-Apr-1991  Procedure: Intubation Indications: Respiratory insufficiency  Procedure Details Consent: Risks of procedure as well as the alternatives and risks of each were explained to the (patient/caregiver).  Consent for procedure obtained. Time Out: Verified patient identification, verified procedure, site/side was marked, verified correct patient position, special equipment/implants available, medications/allergies/relevent history reviewed, required imaging and test results available.  Performed  Maximum sterile technique was used including antiseptics, gloves, gown, hand hygiene and mask.  MAC and 4  Pt received the following meds : Versed 3 mg IV, Fentanyl 100 mcg IV, Etomidate 20 mg IV, Succinylcholine 60 mg IV >> all in divided doses.   With gleidoscope use, was able to visualize VC and place ETT size 7.5.  Minimal secretions noted.   Evaluation Hemodynamic Status: BP stable throughout; O2 sats: stable throughout Patient's Current Condition: stable Complications: No apparent complications Patient did tolerate procedure well. Chest X-ray ordered to verify placement.  CXR: pending.   Tara [name] Tara [name] IXL 10/27/2016

## 2016-10-27 NOTE — Progress Notes (Signed)
Elink notified of pt's increased HR and O2 sats in the low 90s..  Will continue to monitor closely and update as needed.

## 2016-10-27 NOTE — Progress Notes (Signed)
ABG RESULTS: 7.387/CO2 47.2.0/ PO2 146/ HC03 28.2/ SO2 99

## 2016-10-27 NOTE — Progress Notes (Addendum)
   LB PCCM  Pt was persistently tachypneic in the 40s almost all day. (+) alar flaring.   She was beginning to tire out.  Rpt CXR with cardiomegaly and B infiltrates.  ABG  7.38/44/166 on BiPaP 60%. Has been getting scheduled xanax as well as diuresis all day and has not improved.   A > Diffuse lung infiltrates / Likely ARDS Differentials : I still think this is pulm edema mainly. Echo with NEF but dilated mildly LA, mild AS, AI and elevated PASP.  Not sure if there is underlying PAH on top of pulm venous HTN.  HCAP on top of this is most likely as well. Pt can have underlying CTD contribating to pulm HTN.   Plan : Intubated pt. Will try 6 cc/kg for now. ARDS protocol.  Will broaden abx >> deescalate accordingly. Send cultures. Will send blood work for CTD. (ANA, ANCA, anti scleroderma, etc), HIV as well. Sedate.  May need paralytics if hypoxemia is worse.  Cont diuresis for now.  May need a RHC   Case d/w pt re: intubation and over all condition.   I left a message to her best friend Denver Faster on her phone.   My critical care time with this patient today was 30 minutes, separate from procedures.   Pollie Meyer, MD 10/27/2016, 3:29 AM [facility] Pager [phone] After 3 pm or if no answer, call [phone]

## 2016-10-27 NOTE — Progress Notes (Signed)
Patient have increased WOB, RR 46. RT placed patient on BIPAP per MD order. Patient placed on 20/8 and 60%. Patient tolerating at this time.

## 2016-10-27 NOTE — Progress Notes (Signed)
PCCM INTERVAL PROGRESS NOTE  25 year old female admitted 11/6 with respiratory failure secondary to PNA vs pulmonary edema. Initially responded well to diuresis and BiPAP, but Echo without systolic dysfunction, however, PAH and RV dilation noted. She had been additionally treated with broad spectrum ABX. 11/8 early AM respiratory status worsened with tachypnea into to 40s. She was placed on BiPAP. CXR without much improvement. ABG with PaO2 /FiO2 280 consistent with ALI.   Will monitor on BiPAP for now, suspect will need intubation at some point in near future if does not turn around  APP critical care time 30 mins.    Joneen Roach, AGACNP-BC [facility]/Critical Care Pager [phone] or [phone]  10/27/2016 2:19 AM

## 2016-10-28 ENCOUNTER — Inpatient Hospital Stay (HOSPITAL_COMMUNITY): Payer: Medicaid Other

## 2016-10-28 DIAGNOSIS — I5031 Acute diastolic (congestive) heart failure: Secondary | ICD-10-CM

## 2016-10-28 DIAGNOSIS — I420 Dilated cardiomyopathy: Secondary | ICD-10-CM

## 2016-10-28 DIAGNOSIS — N179 Acute kidney failure, unspecified: Secondary | ICD-10-CM

## 2016-10-28 DIAGNOSIS — R6521 Severe sepsis with septic shock: Secondary | ICD-10-CM

## 2016-10-28 DIAGNOSIS — A419 Sepsis, unspecified organism: Principal | ICD-10-CM

## 2016-10-28 LAB — GLUCOSE, CAPILLARY
GLUCOSE-CAPILLARY: 128 mg/dL — AB (ref 65–99)
GLUCOSE-CAPILLARY: 131 mg/dL — AB (ref 65–99)
GLUCOSE-CAPILLARY: 99 mg/dL (ref 65–99)
Glucose-Capillary: 94 mg/dL (ref 65–99)
Glucose-Capillary: 125 mg/dL — ABNORMAL HIGH (ref 65–99)
Glucose-Capillary: 100 mg/dL — ABNORMAL HIGH (ref 65–99)

## 2016-10-28 LAB — BASIC METABOLIC PANEL
ANION GAP: 9 (ref 5–15)
ANION GAP: 14 (ref 5–15)
BUN: 49 mg/dL — ABNORMAL HIGH (ref 6–20)
BUN: 52 mg/dL — AB (ref 6–20)
CALCIUM: 8.3 mg/dL — AB (ref 8.9–10.3)
CALCIUM: 8.2 mg/dL — AB (ref 8.9–10.3)
CO2: 25 mmol/L (ref 22–32)
CO2: 21 mmol/L — AB (ref 22–32)
Chloride: 103 mmol/L (ref 101–111)
Chloride: 103 mmol/L (ref 101–111)
Creatinine, Ser: 2.05 mg/dL — ABNORMAL HIGH (ref 0.44–1.00)
Creatinine, Ser: 2.02 mg/dL — ABNORMAL HIGH (ref 0.44–1.00)
GFR calc Af Amer: 38 mL/min — ABNORMAL LOW (ref >60)
GFR calc non Af Amer: 33 mL/min — ABNORMAL LOW (ref >60)
GFR, EST AFRICAN AMERICAN: 37 mL/min — AB (ref >60)
GFR, EST NON AFRICAN AMERICAN: 32 mL/min — AB (ref >60)
GLUCOSE: 101 mg/dL — AB (ref 65–99)
GLUCOSE: 90 mg/dL (ref 65–99)
POTASSIUM: 5.8 mmol/L — AB (ref 3.5–5.1)
Potassium: 4.6 mmol/L (ref 3.5–5.1)
SODIUM: 137 mmol/L (ref 135–145)
Sodium: 138 mmol/L (ref 135–145)

## 2016-10-28 LAB — CBC
HCT: 39.4 % (ref 36.0–46.0)
Hemoglobin: 11.4 g/dL — ABNORMAL LOW (ref 12.0–15.0)
MCH: 22.1 pg — ABNORMAL LOW (ref 26.0–34.0)
MCHC: 28.9 g/dL — ABNORMAL LOW (ref 30.0–36.0)
MCV: 76.4 fL — AB (ref 78.0–100.0)
PLATELETS: 296 10*3/uL (ref 150–400)
RBC: 5.16 MIL/uL — AB (ref 3.87–5.11)
RDW: 15.9 % — ABNORMAL HIGH (ref 11.5–15.5)
WBC: 13.3 10*3/uL — AB (ref 4.0–10.5)

## 2016-10-28 LAB — POCT I-STAT 3, ART BLOOD GAS (G3+)
ACID-BASE EXCESS: 1 mmol/L (ref 0.0–2.0)
ACID-BASE EXCESS: 3 mmol/L — AB (ref 0.0–2.0)
BICARBONATE: 28.9 mmol/L — AB (ref 20.0–28.0)
Bicarbonate: 29.7 mmol/L — ABNORMAL HIGH (ref 20.0–28.0)
O2 SAT: 99 %
O2 Saturation: 99 %
PH ART: 7.323 — AB (ref 7.350–7.450)
PO2 ART: 132 mmHg — AB (ref 83.0–108.0)
TCO2: 31 mmol/L (ref 0–100)
TCO2: 31 mmol/L (ref 0–100)
pCO2 arterial: 64.6 mmHg — ABNORMAL HIGH (ref 32.0–48.0)
pCO2 arterial: 57.4 mmHg — ABNORMAL HIGH (ref 32.0–48.0)
pH, Arterial: 7.261 — ABNORMAL LOW (ref 7.350–7.450)
pO2, Arterial: 182 mmHg — ABNORMAL HIGH (ref 83.0–108.0)

## 2016-10-28 LAB — BLOOD GAS, ARTERIAL
Acid-base deficit: 2 mmol/L (ref 0.0–2.0)
BICARBONATE: 26.7 mmol/L (ref 20.0–28.0)
Drawn by: 36527
FIO2: 50
LHR: 20 {breaths}/min
O2 Saturation: 98.3 %
PEEP: 10 cmH2O
Patient temperature: 98.6
VT: 360 mL
pCO2 arterial: 89.4 mmHg (ref 32.0–48.0)
pH, Arterial: 7.103 — CL (ref 7.350–7.450)
pO2, Arterial: 140 mmHg — ABNORMAL HIGH (ref 83.0–108.0)

## 2016-10-28 LAB — ANTINUCLEAR ANTIBODIES, IFA
ANTINUCLEAR ANTIBODIES, IFA: NEGATIVE
ANTINUCLEAR ANTIBODIES, IFA: NEGATIVE

## 2016-10-28 LAB — PHOSPHORUS
PHOSPHORUS: 9.1 mg/dL — AB (ref 2.5–4.6)
PHOSPHORUS: 5.2 mg/dL — AB (ref 2.5–4.6)

## 2016-10-28 LAB — ANCA TITERS

## 2016-10-28 LAB — LEGIONELLA PNEUMOPHILA SEROGP 1 UR AG: L. pneumophila Serogp 1 Ur Ag: NEGATIVE

## 2016-10-28 LAB — MPO/PR-3 (ANCA) ANTIBODIES
ANCA Proteinase 3: <3.5 U/mL (ref 0.0–3.5)
Myeloperoxidase Abs: <9 U/mL (ref 0.0–9.0)

## 2016-10-28 LAB — ANTI-JO 1 ANTIBODY, IGG

## 2016-10-28 LAB — ANTI-DNA ANTIBODY, DOUBLE-STRANDED: ds DNA Ab: 2 IU/mL (ref 0–9)

## 2016-10-28 LAB — MAGNESIUM
MAGNESIUM: 2.5 mg/dL — AB (ref 1.7–2.4)
Magnesium: 2.8 mg/dL — ABNORMAL HIGH (ref 1.7–2.4)

## 2016-10-28 LAB — VANCOMYCIN, TROUGH: VANCOMYCIN TR: 38 ug/mL — AB (ref 15–20)

## 2016-10-28 LAB — RHEUMATOID FACTOR: RHEUMATOID FACTOR: 11.5 [IU]/mL (ref 0.0–13.9)

## 2016-10-28 LAB — ANTI-SCLERODERMA ANTIBODY: Scleroderma (Scl-70) (ENA) Antibody, IgG: <0.2 AI (ref 0.0–0.9)

## 2016-10-28 MED ORDER — VECURONIUM BROMIDE 10 MG IV SOLR
INTRAVENOUS | Status: AC
  Administered 2016-10-28: 5 mg
  Filled 2016-10-28: qty 10

## 2016-10-28 MED ORDER — VITAL HIGH PROTEIN PO LIQD
1000.0000 mL | ORAL | Status: DC
  Administered 2016-10-28 – 2016-10-30 (×3): 1000 mL
  Administered 2016-10-30: 08:00:00

## 2016-10-28 MED ORDER — ARTIFICIAL TEARS OP OINT
TOPICAL_OINTMENT | OPHTHALMIC | Status: DC | PRN
  Administered 2016-10-28: 18:00:00 via OPHTHALMIC
  Administered 2016-10-30: 1 via OPHTHALMIC
  Filled 2016-10-28: qty 3.5

## 2016-10-28 MED ORDER — PIPERACILLIN-TAZOBACTAM 3.375 G IVPB
3.3750 g | Freq: Three times a day (TID) | INTRAVENOUS | Status: DC
  Administered 2016-10-28 – 2016-11-01 (×12): 3.375 g via INTRAVENOUS
  Filled 2016-10-28 (×14): qty 50

## 2016-10-28 MED ORDER — SODIUM CHLORIDE 0.9 % IV BOLUS (SEPSIS)
1000.0000 mL | Freq: Once | INTRAVENOUS | Status: AC
  Administered 2016-10-28: 1000 mL via INTRAVENOUS

## 2016-10-28 MED ORDER — SODIUM CHLORIDE 0.9% FLUSH: 10.0000 mL | INTRAVENOUS | Status: DC | PRN

## 2016-10-28 MED ORDER — SODIUM CHLORIDE 0.9% FLUSH
10.0000 mL | Freq: Two times a day (BID) | INTRAVENOUS | Status: DC
Start: 2016-10-28 — End: 2016-11-03
  Administered 2016-10-28: 10 mL
  Administered 2016-10-28: 20 mL
  Administered 2016-10-29: 40 mL
  Administered 2016-10-29: 20 mL
  Administered 2016-10-30: 10 mL
  Administered 2016-10-30: 20 mL
  Administered 2016-10-31: 40 mL
  Administered 2016-10-31 – 2016-11-02 (×4): 10 mL

## 2016-10-28 NOTE — Progress Notes (Signed)
eLink Physician-Brief Progress Note Patient Name: Tara [name] DOB: 08-07-1991 MRN: [id number]   Date of Service  10/28/2016  HPI/Events of Note  ABG on 50%/PRVC 20/TV 360/P10 = 7.103/89.4/140  eICU Interventions  Will order: 1. Increase PRVC rate to 30. 2. ABG at 6 AM.       Intervention Category Major Interventions: Acid-Base disturbance - evaluation and management;Respiratory failure - evaluation and management  Sommer,Steven Dennard Nip 10/28/2016, 4:48 AM

## 2016-10-28 NOTE — Progress Notes (Signed)
Advanced Heart Failure Rounding Note  Referring Physician: Dr. Corrie Dandy Primary Physician: None per patient.  Primary Cardiologist:  None  Reason for Consultation: ? Post-partum cardiomyopathy vs Diastolic HF  Subjective:    Echo 10/26/16 LVEF 60-65%, Mild AS, mild LAE, Mild TI, PA peak pressure 54 mm Hg. Normal RV  Remains intubated and sedated.  Became hypotensive yesterday evening requiring nor-epi.  Given liter of fluid.  Lasix stopped.  Nor-epi weaned off around 65. PEEP at 10 currently.   Tmax 101.2.  Blood Cx NGTD. Hypotensive yesterday.  Creatinine markedly worse this am. She did have good UO with IV lasix and metolazone yesterday, but positive now with getting IVF.   Objective:   Weight Range: 255 lb 15.3 oz (116.1 kg) Body mass index is 41.31 kg/m.   Vital Signs:   Temp:  [98.5 F (36.9 C)-101.2 F (38.4 C)] 99.4 F (37.4 C) (11/09 0416) Pulse Rate:  [85-449] 95 (11/09 0724) Resp:  [17-44] 30 (11/09 0724) BP: (62-119)/(15-82) 103/71 (11/09 0724) SpO2:  [86 %-100 %] 100 % (11/09 0724) FiO2 (%):  [40 %-50 %] 40 % (11/09 0724) Weight:  [255 lb 15.3 oz (116.1 kg)] 255 lb 15.3 oz (116.1 kg) (11/09 0416) Last BM Date: 10/26/16  Weight change: Filed Weights   10/27/16 0413 10/27/16 0500 10/28/16 0416  Weight: 251 lb 1.7 oz (113.9 kg) 251 lb 1.7 oz (113.9 kg) 255 lb 15.3 oz (116.1 kg)    Intake/Output:   Intake/Output Summary (Last 24 hours) at 10/28/16 0744 Last data filed at 10/28/16 0700  Gross per 24 hour  Intake           2749.8 ml  Output             2175 ml  Net            574.8 ml     Physical Exam: General:  Obese. Remains intubated sedated.  HEENT: Normal. Neck: supple. JVP remains elevated to jaw. 2+ bilat; no bruits. No lymphadenopathy. Thyroid diffusely enlarged.  Cor: PMI nondisplaced. Tachy, regular. No M/G/R noted.   Lungs: Diminished, mechanical breathing sounds.  Abdomen: Obese, soft, NT, ND, no HSM. No bruits or masses. +BS    Extremities: no cyanosis, clubbing, rash. 1-2+ edema to knee.   Neuro: Intubated and sedated.  Telemetry: Reviewed, NSR to Sinus Tach  Labs: CBC  Recent Labs  10/25/16 1851 10/26/16 0836 10/27/16 0850 10/28/16 0510  WBC 10.3 9.3 16.6* 13.3*  NEUTROABS 7.8* 8.0*  --   --   HGB 10.4* 10.5* 10.2* 11.4*  HCT 32.5* 33.3* 32.9* 39.4  MCV 69.7* 70.3* 70.9* 76.4*  PLT 234 239 256 115   Basic Metabolic Panel  Recent Labs  10/27/16 1053 10/27/16 1624 10/28/16 0510  NA 140  --  137  K 4.3  --  5.8*  CL 106  --  103  CO2 24  --  25  GLUCOSE 115*  --  101*  BUN 16  --  49*  CREATININE 0.57  --  2.05*  CALCIUM 8.7*  --  8.3*  MG 2.2 2.3 2.5*  PHOS 4.0 6.6* 9.1*   Liver Function Tests  Recent Labs  10/25/16 1851 10/26/16 0836  AST 23 29  ALT 17 18  ALKPHOS 113 121  BILITOT 0.4 0.6  PROT 6.6 6.8  ALBUMIN 3.0* 3.0*   No results for input(s): LIPASE, AMYLASE in the last 72 hours. Cardiac Enzymes  Recent Labs  10/26/16 0624 10/26/16 1105  TROPONINI 0.03* <0.03    BNP: BNP (last 3 results)  Recent Labs  08/29/16 1018 10/24/16 1403 10/26/16 0624  BNP 15.2 205.8* 227.6*    ProBNP (last 3 results) No results for input(s): PROBNP in the last 8760 hours.   D-Dimer No results for input(s): DDIMER in the last 72 hours. Hemoglobin A1C No results for input(s): HGBA1C in the last 72 hours. Fasting Lipid Panel  Recent Labs  10/27/16 0339  TRIG 98   Thyroid Function Tests  Recent Labs  10/25/16 1959  TSH <0.010*    Other results:     Imaging/Studies:  Dg Chest Port 1 View  Result Date: 10/27/2016 CLINICAL DATA:  Respiratory failure.  Endotracheal tube placement. EXAM: PORTABLE CHEST 1 VIEW COMPARISON:  10/27/2016 FINDINGS: An endotracheal tube is been placed. Tip measures 4 mm above the carina. This measurement may be artifactually reduced due to shallow inspiration and AP technique. Shallow inspiration. Bilateral perihilar airspace  consolidation is increasing since previous study. Cardiac enlargement. No pneumothorax. Enteric tube tip is off the field of view but below the left hemidiaphragm. IMPRESSION: Endotracheal tube tip is low, measuring only about 4 mm above the carina. Increasing perihilar consolidation in the lungs bilaterally. Electronically Signed   By: Lucienne Capers M.D.   On: 10/27/2016 04:13   Dg Chest Port 1 View  Result Date: 10/27/2016 CLINICAL DATA:  Respiratory distress tonight EXAM: PORTABLE CHEST 1 VIEW COMPARISON:  10/26/2016 FINDINGS: Shallow inspiration. Cardiac enlargement. Bilateral perihilar airspace disease, greater on the right. Changes suggest pneumonia. Similar appearance to previous study. No pleural effusions. No pneumothorax. Mediastinal contours appear intact. IMPRESSION: Bilateral perihilar infiltrates, greater on the right, suggesting pneumonia. Similar appearance to previous study. Electronically Signed   By: Lucienne Capers M.D.   On: 10/27/2016 01:50   Dg Chest Port 1 View  Result Date: 10/26/2016 CLINICAL DATA:  Shortness of breath and chest pain for 2 days. EXAM: PORTABLE CHEST 1 VIEW COMPARISON:  10/25/2016 FINDINGS: The cardiac silhouette remains mildly enlarged. The lungs remain hypoinflated. There is increasing airspace consolidation in the right perihilar region. Patchy left lung opacities have not significantly changed. No sizable pleural effusion or pneumothorax is identified. No acute osseous abnormality is seen. IMPRESSION: Bilateral airspace opacities with interval worsening on the right, concerning for pneumonia. Electronically Signed   By: Logan Bores M.D.   On: 10/26/2016 19:42   Dg Abd Portable 1v  Result Date: 10/27/2016 CLINICAL DATA:  NG tube placement EXAM: PORTABLE ABDOMEN - 1 VIEW COMPARISON:  None. FINDINGS: Enteric tube tip is in the left upper quadrant consistent with location in the upper stomach. Scattered gas and stool in the colon without distention.  IMPRESSION: Enteric tube tip is in the left upper quadrant consistent with location in the upper stomach. Electronically Signed   By: Lucienne Capers M.D.   On: 10/27/2016 04:10     Latest Echo  Latest Cath   Medications:     Scheduled Medications: . azithromycin  500 mg Intravenous Q24H  . chlorhexidine gluconate (MEDLINE KIT)  15 mL Mouth Rinse BID  . Chlorhexidine Gluconate Cloth  6 each Topical Q0600  . enoxaparin (LOVENOX) injection  40 mg Subcutaneous Q24H  . famotidine (PEPCID) IV  20 mg Intravenous Q12H  . feeding supplement (PRO-STAT SUGAR FREE 64)  60 mL Per Tube QID  . feeding supplement (VITAL HIGH PROTEIN)  1,000 mL Per Tube Q24H  . mouth rinse  15 mL Mouth Rinse 10 times per day  .  methimazole  15 mg Oral TID  . mupirocin ointment  1 application Nasal BID  . piperacillin-tazobactam (ZOSYN)  IV  3.375 g Intravenous Q8H  . vancomycin  1,000 mg Intravenous Q8H     Infusions: . fentaNYL infusion INTRAVENOUS 200 mcg/hr (10/28/16 0441)  . norepinephrine (LEVOPHED) Adult infusion Stopped (10/28/16 0320)  . propofol (DIPRIVAN) infusion 25 mcg/kg/min (10/28/16 0440)     PRN Medications:  acetaminophen, docusate, guaiFENesin   Assessment/Plan   Tara [name] is a 25 y.o. female with history of graves diease, HTN, s/p C-section 0/9326 complicated by post-partum HCAP, pregnancy complicated by pre-eclampsia. Treated for CAP in ED 10/24/16 and sent home.  Returned to ED and admitted 10/25/16 with acute respiratory failure with hypoxia.  Intubated early morning 10/27/16 with pulmonary edema and anxiety.  1. Acute respiratory failure s/p intubation 10/27/16 - Remains on full vent support. PEEP 10.  - Diuresis on hold for now with hypotension overnight, now also with ARF.  - Blood Cx NGTD. Respiratory culture inconclusive. Tmax now 101.2 overnight - She is on Vanc, Zosyn, and Azithromycin.  - WBC remains elevated but down from yesterday.  2. Acute on chronic diastolic  CHF - Echo 71/2/45 LVEF 60-65%, Mild AS, mild LAE, Mild TI, PA peak pressure 54 mm Hg. Normal RV - She remains volume overloaded.  Diuresis yesterday limited by hypotension.  Now with ARF.  - She still needs to diurese.  Would try low dose IV lasix, but need to watch pressures and Creatinine.   - ? Etiology. Could be do to poorly controlled hyperthyroidism vs undiagnosed OSA/OHS with body habitus vs poorly controlled HTN  Family not available for history.  3. Graves disease - On methimazole and BB with tachycardia.  - TSH < 0.010, T4 4.77 4. Severe anxiety - Per PCCM/IM 5. Hyperkalemia - K 5.8 this am.  6. ARF  - Creatinine 0.5 -> 2.05. Stat recheck BMET. - Likely ATN with hypotension yesterday down into 60s.     Length of Stay: 3  Shirley Friar PA-C 10/28/2016, 7:44 AM  Advanced Heart Failure Team Pager [phone] (M-F; 7a - 4p)  Please contact [facility] for night-coverage after hours (4p -7a ) and weekends on [email or web address]   Patient seen and examined with Oda Kilts, PA-C. We discussed all aspects of the encounter. I agree with the assessment and plan as stated above.   Still appears volume overloaded but was hypotensive with diuresis overnight and now creatinine up. Unclear if overdiuresis or component of sepsis overnight. Would recommend central access to better assess volume status and guide diuresis.   Discussed with CCM.   The patient is critically ill with multiple organ systems failure and requires high complexity decision making for assessment and support, frequent evaluation and titration of therapies, application of advanced monitoring technologies and extensive interpretation of multiple databases.   Critical Care Time devoted to patient care services described in this note is 35 Minutes.  Bensimhon, Daniel,MD 8:25 AM

## 2016-10-28 NOTE — Progress Notes (Signed)
Name: Tara [name] MRN: [id number] DOB: [birth year]    ADMISSION DATE:  10/25/2016 CONSULTATION DATE:  10/25/2016  REFERRING MD :  Dr. Montez Moritaarter [facility]  CHIEF COMPLAINT:  SOB, cough  HISTORY OF PRESENT ILLNESS:  25 year old female with PMH as below, which is significant for recent pregnancy with healthy cesarean delivery. Pregnancy complicated by pre-eclampsia. Her post-partum course was complicated by pneumonia, but there is some thought that there may have been post-partum cardiomyopathy at that time as well. This is not supported by the EMR. [facility] with complaints of SOB and productive cough. CXR was done demonstrating bilateral airspace opacifications read as pneumonia. She was given IM ceftriaxone, a Z-pack, and was discharged. [facility] 11/6 with similar complaints. CXR was again positive for bilateral opacifications. O2 sats were 85% on room air and she had increased work of breathing. Sats improved on 6L [place], but she continued to have dyspnea. There was concern for PE so she was pre-medicated due to iodine allergy. No scan at this time. Due to complexity and no improvement on outpatient antibiotics, PCCM consulted for further assistance.   She describes 3 days of worsening cough, pick/blood streak sputum, and dyspnea. Ankles swollen, however, this has been going on for some time. Endorses fevers/chills at home. Laboratory eval in ED significant for WBC only 10.3, Hgb 10.4, K 3.2, BNP 205.8.   SIGNIFICANT EVENTS  07/2016 delivery of healthy baby boy, post partum course complicated by PNA 11/5 ED for SOB, thought to be CAP and discharged with ABX 11/6 back to ED with worsening dyspnea.   STUDIES:  CTA chest 11/6 > negative for PE  SUBJECTIVE:  Diuresis stopped for hypotension  VITAL SIGNS: Temp:  [98.5 F (36.9 C)-101.2 F (38.4 C)] 99.4 F (37.4 C) (11/09 0803) Pulse Rate:  [85-449] 95 (11/09 0724) Resp:   [17-44] 30 (11/09 0724) BP: (62-119)/(15-82) 103/71 (11/09 0724) SpO2:  [86 %-100 %] 100 % (11/09 0724) FiO2 (%):  [40 %-50 %] 40 % (11/09 0724) Weight:  [255 lb 15.3 oz (116.1 kg)] 255 lb 15.3 oz (116.1 kg) (11/09 0416)  PHYSICAL EXAMINATION: General:  Obese female sedated on vent Neuro:  Sedate, not following commands on propofol HEENT:  [place]/AT, PERRL, exophthalmus, no obvious JVD Cardiovascular:  Tachy, regular. +2 BLE edema, now of levo drip Lungs:  Coarse crackles entire posterior chest Abdomen:  Soft, non-tender, non-distended Musculoskeletal:  No acute deformity Skin:  Grossly intact  Recent Labs Lab 10/27/16 0850 10/27/16 1053 10/28/16 0510  NA 140 140 137  K 4.5 4.3 5.8*  CL 106 106 103  CO2 25 24 25   BUN 18 16 49*  CREATININE 0.68 0.57 2.05*  GLUCOSE 128* 115* 101*    Recent Labs Lab 10/26/16 0836 10/27/16 0850 10/28/16 0510  HGB 10.5* 10.2* 11.4*  HCT 33.3* 32.9* 39.4  WBC 9.3 16.6* 13.3*  PLT 239 256 296   Dg Chest Port 1 View  Result Date: 10/28/2016 CLINICAL DATA:  Respiratory difficulty EXAM: PORTABLE CHEST 1 VIEW COMPARISON:  10/27/2016 FINDINGS: Endotracheal and NG tubes are again noted. They are stable. Diffuse bilateral airspace disease with a central prep collection is stable. No pneumothorax. The heart remains enlarged. No definite pleural effusion. IMPRESSION: Improving bilateral diffuse airspace disease. Electronically Signed   By: Jolaine ClickArthur  Hoss M.D.   On: 10/28/2016 07:51   Dg Chest Port 1 View  Result Date: 10/27/2016 CLINICAL DATA:  Respiratory  failure.  Endotracheal tube placement. EXAM: PORTABLE CHEST 1 VIEW COMPARISON:  10/27/2016 FINDINGS: An endotracheal tube is been placed. Tip measures 4 mm above the carina. This measurement may be artifactually reduced due to shallow inspiration and AP technique. Shallow inspiration. Bilateral perihilar airspace consolidation is increasing since previous study. Cardiac enlargement. No pneumothorax. Enteric  tube tip is off the field of view but below the left hemidiaphragm. IMPRESSION: Endotracheal tube tip is low, measuring only about 4 mm above the carina. Increasing perihilar consolidation in the lungs bilaterally. Electronically Signed   By: Burman Nieves M.D.   On: 10/27/2016 04:13   Dg Chest Port 1 View  Result Date: 10/27/2016 CLINICAL DATA:  Respiratory distress tonight EXAM: PORTABLE CHEST 1 VIEW COMPARISON:  10/26/2016 FINDINGS: Shallow inspiration. Cardiac enlargement. Bilateral perihilar airspace disease, greater on the right. Changes suggest pneumonia. Similar appearance to previous study. No pleural effusions. No pneumothorax. Mediastinal contours appear intact. IMPRESSION: Bilateral perihilar infiltrates, greater on the right, suggesting pneumonia. Similar appearance to previous study. Electronically Signed   By: Burman Nieves M.D.   On: 10/27/2016 01:50   Dg Chest Port 1 View  Result Date: 10/26/2016 CLINICAL DATA:  Shortness of breath and chest pain for 2 days. EXAM: PORTABLE CHEST 1 VIEW COMPARISON:  10/25/2016 FINDINGS: The cardiac silhouette remains mildly enlarged. The lungs remain hypoinflated. There is increasing airspace consolidation in the right perihilar region. Patchy left lung opacities have not significantly changed. No sizable pleural effusion or pneumothorax is identified. No acute osseous abnormality is seen. IMPRESSION: Bilateral airspace opacities with interval worsening on the right, concerning for pneumonia. Electronically Signed   By: Sebastian Ache M.D.   On: 10/26/2016 19:42   Dg Abd Portable 1v  Result Date: 10/27/2016 CLINICAL DATA:  NG tube placement EXAM: PORTABLE ABDOMEN - 1 VIEW COMPARISON:  None. FINDINGS: Enteric tube tip is in the left upper quadrant consistent with location in the upper stomach. Scattered gas and stool in the colon without distention. IMPRESSION: Enteric tube tip is in the left upper quadrant consistent with location in the upper  stomach. Electronically Signed   By: Burman Nieves M.D.   On: 10/27/2016 04:10   I reviewed chest CT myself, no PE, infiltrate noted.  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  ASSESSMENT / PLAN:  PULMONARY A: Acute pulmonary edema leading to respiratory failure, procalcitonin <0.1, afebrile, unlikely infection, cultures sent. 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 - Auto-immune work up for pulmonary HTN - No PE noted- did not tolerate diuresis -place cvl to guide diuresis  RENAL Lab Results  Component Value Date   CREATININE 2.02 (H) 10/28/2016   CREATININE 2.05 (H) 10/28/2016   CREATININE 0.57 10/27/2016   CREATININE 0.40 (L) 02/26/2016    Recent Labs Lab 10/27/16 1053 10/28/16 0510 10/28/16 0800  K 4.3 5.8* 4.6    Renal insuff Hperkalemia  A:  No active issues P:   - Lasix 40 mg IV q6 x3 doses. STOPPED DUE TO HYPOTENSION 11/8 ANFD ivf GIVEN - BMET in AM. -renal consult called 11/9. May need CRRT  GASTROINTESTINAL A:   No active issues P:   - TF per nutrition.  HEMATOLOGIC A:   Leukocytosis, likely a stress response P:  - CBC in AM  INFECTIOUS A:   Doubt PNA but c x r with ards type pattern P:   - F/U cultures, 11/9 ngtd - On zithro  ENDOCRINE A:   No active issues   P:   - Monitor  NEUROLOGIC A:     Severe anxiety P:   RASS goal: 0 to -1 - Propofol drip - Fentanyl drip. - Will need benzos when extubated.  FAMILY  - Updates: No family bedside  - Inter-disciplinary family meet or Palliative Care meeting due by:    Brett Canales Minor ACNP Adolph Pollack PCCM Pager [phone] till 3 pm If no answer page [phone] 10/28/2016, 8:46 AM  Attending Note:  25 year old female with post partum cardiomyopathy and pulmonary edema, now started on pressors for shock, not sure if cardiac or septic.  On exam, sedate and not tachypnic on sedation.  I reviewed CXR myself, ETT ok.  Infiltrate noted.  ?ARDS.  Will continue broad spectrum abx.  Place stop date on zithromax for 5 days.  Continue vanc/zosyn.  F/U on culture.  Place TLC in patient today and check CVP.  If high then cardiac and will likely need dialysis for volume control.  If low then septic and will need no further volume negative.  No family bedside to update.  Attempted to get a hold of family with no success, will place TLC emergently given pressor need.  The patient is critically ill with multiple organ systems failure and requires high complexity decision making for assessment and support, frequent evaluation and titration of therapies, application of advanced monitoring technologies and extensive interpretation of multiple databases.   Critical Care Time devoted to patient care services described in this note is  35  Minutes. This time reflects time of care of this signee Dr Koren Bound. This critical care time does not reflect procedure time, or teaching time or supervisory time of PA/NP/Med student/Med Resident etc but could involve care discussion time.  Alyson Reedy, M.D. [facility] Pulmonary/Critical Care Medicine. Pager: [phone]. After hours pager: [phone].

## 2016-10-28 NOTE — Procedures (Signed)
Central Venous Catheter Insertion Procedure Note MIRANDAH SLOMSKI [id number] Jul 14, 1991  Procedure: Insertion of Central Venous Catheter Indications: Assessment of intravascular volume, Drug and/or fluid administration and Frequent blood sampling  Procedure Details Consent: Unable to obtain consent because of emergent medical necessity. Time Out: Verified patient identification, verified procedure, site/side was marked, verified correct patient position, special equipment/implants available, medications/allergies/relevent history reviewed, required imaging and test results available.  Performed  Maximum sterile technique was used including antiseptics, cap, gloves, gown, hand hygiene, mask and sheet. Skin prep: Chlorhexidine; local anesthetic administered A antimicrobial bonded/coated triple lumen catheter was placed in the left internal jugular vein using the Seldinger technique. Ultrasound guidance used.Yes.   Catheter placed to 20 cm. Blood aspirated via all 3 ports and then flushed x 3. Line sutured x 2 and dressing applied.  Evaluation Blood flow good Complications: No apparent complications Patient did tolerate procedure well. Chest X-ray ordered to verify placement.  CXR: pending.  Brett Canales Minor ACNP [facility] Pollack PCCM Pager [phone] till 3 pm If no answer page [phone] 10/28/2016, 10:13 AM  Alyson Reedy, M.D. [facility] Pulmonary/Critical Care Medicine. Pager: [phone]. After hours pager: [phone].

## 2016-10-28 NOTE — Consult Note (Signed)
Reason for Consult: Acute Renal Failure Referring Physician: Corrie Dandy  Tara [name] is an 25 y.o. female with PMH of Graves' disease, HTN, and recent Cesarean delivery (06/27/01) complicated by post-partum HCAP (August) who presented for fever, cough, and worsening shortness of breath.   HPI:  Patient initially presented to ED 11/5 with fever, chills, chest pain, SOB, cough/congestion x 2 days and found to have bilateral opacities, concerning for PNA, and cardiomegaly; was given a dose and rocephin and discharged home to start on azithromycin. She returned to ED the next day with hypoxia to 82% on RA and chest pain/tightness. Had CT angio with contrast (100 mL isovue) 11/6 that showed diffuse airspace consolidation, concerning for ARDS; again heart was noted to be enlarged. Patient was intubated 11/8 for worsening respiratory failure. Was hypotensive to 62/15 with diuresis on 11/8. Baseline SCr ~ 0.5-0.6. Increased from 0.57 on 11/8 to 2.05 11/9. Has continued to have adequate urine output.   Past Medical History:  Diagnosis Date  . Anemia   . CAP (community acquired pneumonia) 02/13/2016  . Chronic bronchitis ([place])   . Graves disease   . Graves disease   . Headache   . Hypertension   . Myocardial infarction 2013  . Obesity   . Pneumonia "several times"    Past Surgical History:  Procedure Laterality Date  . CESAREAN SECTION    . CESAREAN SECTION N/A 07/21/2016   Procedure: CESAREAN SECTION;  Surgeon: Guss Bunde, MD;  Location: [facility];  Service: Obstetrics;  Laterality: N/A;  . EYE SURGERY Bilateral 2014   "for Graves Disease; eyelid retraction on left; decompression on the right""    No family history on file.  Social History:  reports that she has never smoked. She has never used smokeless tobacco. She reports that she does not drink alcohol or use drugs.  Allergies:  Allergies  Allergen Reactions  . Iodine Hives  . Shellfish Allergy Other (See Comments)    Medications:  Scheduled: . azithromycin  500 mg Intravenous Q24H  . chlorhexidine gluconate (MEDLINE KIT)  15 mL Mouth Rinse BID  . Chlorhexidine Gluconate Cloth  6 each Topical Q0600  . enoxaparin (LOVENOX) injection  40 mg Subcutaneous Q24H  . famotidine (PEPCID) IV  20 mg Intravenous Q12H  . feeding supplement (PRO-STAT SUGAR FREE 64)  60 mL Per Tube QID  . feeding supplement (VITAL HIGH PROTEIN)  1,000 mL Per Tube Q24H  . mouth rinse  15 mL Mouth Rinse 10 times per day  . methimazole  15 mg Oral TID  . mupirocin ointment  1 application Nasal BID  . piperacillin-tazobactam (ZOSYN)  IV  3.375 g Intravenous Q8H  . sodium chloride flush  10-40 mL Intracatheter Q12H    Results for orders placed or performed during the hospital encounter of 10/25/16 (from the past 48 hour(s))  I-STAT 3, arterial blood gas (G3+)     Status: Abnormal   Collection Time: 10/27/16  2:07 AM  Result Value Ref Range   pH, Arterial 7.387 7.350 - 7.450   pCO2 arterial 44.0 32.0 - 48.0 mmHg   pO2, Arterial 166.0 (H) 83.0 - 108.0 mmHg   Bicarbonate 26.5 20.0 - 28.0 mmol/L   TCO2 28 0 - 100 mmol/L   O2 Saturation 99.0 %   Acid-Base Excess 1.0 0.0 - 2.0 mmol/L   Patient temperature 98.0 F    Collection site RADIAL, ALLEN'S TEST ACCEPTABLE    Drawn by RT    Sample type  ARTERIAL   Culture, respiratory (NON-Expectorated)     Status: None (Preliminary result)   Collection Time: 10/27/16  3:17 AM  Result Value Ref Range   Specimen Description TRACHEAL ASPIRATE    Special Requests Normal    Gram Stain      MODERATE WBC PRESENT,BOTH PMN AND MONONUCLEAR RARE GRAM POSITIVE COCCI IN PAIRS    Culture CULTURE REINCUBATED FOR BETTER GROWTH    Report Status PENDING   Triglycerides     Status: None   Collection Time: 10/27/16  3:39 AM  Result Value Ref Range   Triglycerides 98 <150 mg/dL  Lactic acid, plasma     Status: None   Collection Time: 10/27/16  3:39 AM  Result Value Ref Range   Lactic Acid, Venous 1.3  0.5 - 1.9 mmol/L  I-STAT 3, arterial blood gas (G3+)     Status: Abnormal   Collection Time: 10/27/16  4:42 AM  Result Value Ref Range   pH, Arterial 7.387 7.350 - 7.450   pCO2 arterial 47.2 32.0 - 48.0 mmHg   pO2, Arterial 146.0 (H) 83.0 - 108.0 mmHg   Bicarbonate 28.2 (H) 20.0 - 28.0 mmol/L   TCO2 30 0 - 100 mmol/L   O2 Saturation 99.0 %   Acid-Base Excess 3.0 (H) 0.0 - 2.0 mmol/L   Patient temperature 99.7 F    Collection site RADIAL, ALLEN'S TEST ACCEPTABLE    Drawn by RT    Sample type ARTERIAL   Procalcitonin     Status: None   Collection Time: 10/27/16  8:50 AM  Result Value Ref Range   Procalcitonin <0.10 ng/mL    Comment:        Interpretation: PCT (Procalcitonin) <= 0.5 ng/mL: Systemic infection (sepsis) is not likely. Local bacterial infection is possible. (NOTE)         ICU PCT Algorithm               Non ICU PCT Algorithm    ----------------------------     ------------------------------         PCT < 0.25 ng/mL                 PCT < 0.1 ng/mL     Stopping of antibiotics            Stopping of antibiotics       strongly encouraged.               strongly encouraged.    ----------------------------     ------------------------------       PCT level decrease by               PCT < 0.25 ng/mL       >= 80% from peak PCT       OR PCT 0.25 - 0.5 ng/mL          Stopping of antibiotics                                             encouraged.     Stopping of antibiotics           encouraged.    ----------------------------     ------------------------------       PCT level decrease by              PCT >= 0.25 ng/mL       <  80% from peak PCT        AND PCT >= 0.5 ng/mL            Continuin g antibiotics                                              encouraged.       Continuing antibiotics            encouraged.    ----------------------------     ------------------------------     PCT level increase compared          PCT > 0.5 ng/mL         with peak PCT AND           PCT >= 0.5 ng/mL             Escalation of antibiotics                                          strongly encouraged.      Escalation of antibiotics        strongly encouraged.   CBC     Status: Abnormal   Collection Time: 10/27/16  8:50 AM  Result Value Ref Range   WBC 16.6 (H) 4.0 - 10.5 K/uL   RBC 4.64 3.87 - 5.11 MIL/uL   Hemoglobin 10.2 (L) 12.0 - 15.0 g/dL   HCT 32.9 (L) 36.0 - 46.0 %   MCV 70.9 (L) 78.0 - 100.0 fL   MCH 22.0 (L) 26.0 - 34.0 pg   MCHC 31.0 30.0 - 36.0 g/dL   RDW 15.9 (H) 11.5 - 15.5 %   Platelets 256 150 - 400 K/uL  Basic metabolic panel     Status: Abnormal   Collection Time: 10/27/16  8:50 AM  Result Value Ref Range   Sodium 140 135 - 145 mmol/L   Potassium 4.5 3.5 - 5.1 mmol/L   Chloride 106 101 - 111 mmol/L   CO2 25 22 - 32 mmol/L   Glucose, Bld 128 (H) 65 - 99 mg/dL   BUN 18 6 - 20 mg/dL   Creatinine, Ser 0.68 0.44 - 1.00 mg/dL   Calcium 8.7 (L) 8.9 - 10.3 mg/dL   GFR calc non Af Amer >60 >60 mL/min   GFR calc Af Amer >60 >60 mL/min    Comment: (NOTE) The eGFR has been calculated using the CKD EPI equation. This calculation has not been validated in all clinical situations. eGFR's persistently <60 mL/min signify possible Chronic Kidney Disease.    Anion gap 9 5 - 15  Magnesium     Status: None   Collection Time: 10/27/16  8:50 AM  Result Value Ref Range   Magnesium 2.2 1.7 - 2.4 mg/dL  Phosphorus     Status: Abnormal   Collection Time: 10/27/16  8:50 AM  Result Value Ref Range   Phosphorus 4.8 (H) 2.5 - 4.6 mg/dL  Lactic acid, plasma     Status: None   Collection Time: 10/27/16  8:50 AM  Result Value Ref Range   Lactic Acid, Venous 1.1 0.5 - 1.9 mmol/L  Anti-scleroderma antibody     Status: None   Collection Time: 10/27/16  8:50 AM  Result Value Ref Range   Scleroderma (Scl-70) (ENA) Antibody,  IgG <0.2 0.0 - 0.9 AI    Comment: (NOTE) Performed At: [facility] [place], [place] [id number] Lindon Romp MD  [id number]   Anti-Jo 1 antibody, IgG     Status: None   Collection Time: 10/27/16  8:50 AM  Result Value Ref Range   Anti JO-1 <0.2 0.0 - 0.9 AI    Comment: (NOTE) Performed At: [facility] [place], [place] [id number] Lindon Romp MD ZJ:[id number]   HIV antibody     Status: None   Collection Time: 10/27/16  8:50 AM  Result Value Ref Range   HIV Screen 4th Generation wRfx Non Reactive Non Reactive    Comment: (NOTE) Performed At: [facility] [address] [place], [place] [id number] Lindon Romp MD ID:[id number]   Anti-DNA antibody, double-stranded     Status: None   Collection Time: 10/27/16  8:50 AM  Result Value Ref Range   ds DNA Ab 2 0 - 9 IU/mL    Comment: (NOTE)                                   Negative      <5                                   Equivocal  5 - 9                                   Positive      >9 Performed At: [facility] [place], [place] [id number] Lindon Romp MD QP:[id number]   Basic metabolic panel     Status: Abnormal   Collection Time: 10/27/16 10:53 AM  Result Value Ref Range   Sodium 140 135 - 145 mmol/L   Potassium 4.3 3.5 - 5.1 mmol/L   Chloride 106 101 - 111 mmol/L   CO2 24 22 - 32 mmol/L   Glucose, Bld 115 (H) 65 - 99 mg/dL   BUN 16 6 - 20 mg/dL   Creatinine, Ser 0.57 0.44 - 1.00 mg/dL   Calcium 8.7 (L) 8.9 - 10.3 mg/dL   GFR calc non Af Amer >60 >60 mL/min   GFR calc Af Amer >60 >60 mL/min    Comment: (NOTE) The eGFR has been calculated using the CKD EPI equation. This calculation has not been validated in all clinical situations. eGFR's persistently <60 mL/min signify possible Chronic Kidney Disease.    Anion gap 10 5 - 15  Sedimentation rate     Status: Abnormal   Collection Time: 10/27/16 10:53 AM  Result Value Ref Range   Sed Rate 55 (H) 0 - 22 mm/hr  C-reactive protein     Status: Abnormal   Collection Time: 10/27/16 10:53 AM  Result Value Ref  Range   CRP 1.6 (H) <1.0 mg/dL  Rheumatoid factor     Status: None   Collection Time: 10/27/16 10:53 AM  Result Value Ref Range   Rhuematoid fact SerPl-aCnc 11.5 0.0 - 13.9 IU/mL    Comment: (NOTE) Performed At: [facility] [place], [place] [id number] Lindon Romp MD [id number]   Magnesium     Status: None   Collection Time: 10/27/16 10:53 AM  Result Value Ref Range  Magnesium 2.2 1.7 - 2.4 mg/dL  Phosphorus     Status: None   Collection Time: 10/27/16 10:53 AM  Result Value Ref Range   Phosphorus 4.0 2.5 - 4.6 mg/dL  Glucose, capillary     Status: Abnormal   Collection Time: 10/27/16 12:03 PM  Result Value Ref Range   Glucose-Capillary 129 (H) 65 - 99 mg/dL   Comment 1 Capillary Specimen   Glucose, capillary     Status: Abnormal   Collection Time: 10/27/16  3:05 PM  Result Value Ref Range   Glucose-Capillary 117 (H) 65 - 99 mg/dL   Comment 1 Capillary Specimen   Magnesium     Status: None   Collection Time: 10/27/16  4:24 PM  Result Value Ref Range   Magnesium 2.3 1.7 - 2.4 mg/dL  Phosphorus     Status: Abnormal   Collection Time: 10/27/16  4:24 PM  Result Value Ref Range   Phosphorus 6.6 (H) 2.5 - 4.6 mg/dL  Glucose, capillary     Status: Abnormal   Collection Time: 10/27/16  8:48 PM  Result Value Ref Range   Glucose-Capillary 137 (H) 65 - 99 mg/dL   Comment 1 Capillary Specimen   Glucose, capillary     Status: Abnormal   Collection Time: 10/27/16 11:50 PM  Result Value Ref Range   Glucose-Capillary 125 (H) 65 - 99 mg/dL   Comment 1 Capillary Specimen   Blood gas, arterial     Status: Abnormal   Collection Time: 10/28/16  4:05 AM  Result Value Ref Range   FIO2 50.00    Delivery systems VENTILATOR    Mode PRESSURE REGULATED VOLUME CONTROL    VT 360 mL   LHR 20 resp/min   Peep/cpap 10.0 cm H20   pH, Arterial 7.103 (LL) 7.350 - 7.450    Comment: CRITICAL RESULT CALLED TO, READ BACK BY AND VERIFIED WITH: SMITH,V RN AT 0440 ON  10/28/2016 BY DAY,J RRT    pCO2 arterial 89.4 (HH) 32.0 - 48.0 mmHg    Comment: CRITICAL RESULT CALLED TO, READ BACK BY AND VERIFIED WITH: SMITH,V RN AT 0440 ON 10/28/2016 BY DAY,J RRT    pO2, Arterial 140 (H) 83.0 - 108.0 mmHg   Bicarbonate 26.7 20.0 - 28.0 mmol/L   Acid-base deficit 2.0 0.0 - 2.0 mmol/L   O2 Saturation 98.3 %   Patient temperature 98.6    Collection site LEFT RADIAL    Drawn by [phone]    Sample type ARTERIAL    Allens test (pass/fail) PASS PASS  Glucose, capillary     Status: None   Collection Time: 10/28/16  4:09 AM  Result Value Ref Range   Glucose-Capillary 99 65 - 99 mg/dL   Comment 1 Capillary Specimen   CBC     Status: Abnormal   Collection Time: 10/28/16  5:10 AM  Result Value Ref Range   WBC 13.3 (H) 4.0 - 10.5 K/uL   RBC 5.16 (H) 3.87 - 5.11 MIL/uL   Hemoglobin 11.4 (L) 12.0 - 15.0 g/dL   HCT 39.4 36.0 - 46.0 %   MCV 76.4 (L) 78.0 - 100.0 fL    Comment: DELTA CHECK NOTED REPEATED TO VERIFY    MCH 22.1 (L) 26.0 - 34.0 pg   MCHC 28.9 (L) 30.0 - 36.0 g/dL   RDW 15.9 (H) 11.5 - 15.5 %   Platelets 296 150 - 400 K/uL  Basic metabolic panel     Status: Abnormal   Collection Time: 10/28/16  5:10 AM  Result Value Ref Range   Sodium 137 135 - 145 mmol/L   Potassium 5.8 (H) 3.5 - 5.1 mmol/L    Comment: DELTA CHECK NOTED   Chloride 103 101 - 111 mmol/L   CO2 25 22 - 32 mmol/L   Glucose, Bld 101 (H) 65 - 99 mg/dL   BUN 49 (H) 6 - 20 mg/dL   Creatinine, Ser 2.05 (H) 0.44 - 1.00 mg/dL    Comment: DELTA CHECK NOTED   Calcium 8.3 (L) 8.9 - 10.3 mg/dL   GFR calc non Af Amer 32 (L) >60 mL/min   GFR calc Af Amer 37 (L) >60 mL/min    Comment: (NOTE) The eGFR has been calculated using the CKD EPI equation. This calculation has not been validated in all clinical situations. eGFR's persistently <60 mL/min signify possible Chronic Kidney Disease.    Anion gap 9 5 - 15  Magnesium     Status: Abnormal   Collection Time: 10/28/16  5:10 AM  Result Value Ref  Range   Magnesium 2.5 (H) 1.7 - 2.4 mg/dL  Phosphorus     Status: Abnormal   Collection Time: 10/28/16  5:10 AM  Result Value Ref Range   Phosphorus 9.1 (H) 2.5 - 4.6 mg/dL  I-STAT 3, arterial blood gas (G3+)     Status: Abnormal   Collection Time: 10/28/16  5:52 AM  Result Value Ref Range   pH, Arterial 7.261 (L) 7.350 - 7.450   pCO2 arterial 64.6 (H) 32.0 - 48.0 mmHg   pO2, Arterial 182.0 (H) 83.0 - 108.0 mmHg   Bicarbonate 28.9 (H) 20.0 - 28.0 mmol/L   TCO2 31 0 - 100 mmol/L   O2 Saturation 99.0 %   Acid-Base Excess 1.0 0.0 - 2.0 mmol/L   Patient temperature 99.4 F    Collection site RADIAL, ALLEN'S TEST ACCEPTABLE    Drawn by RT    Sample type ARTERIAL   Basic metabolic panel     Status: Abnormal   Collection Time: 10/28/16  8:00 AM  Result Value Ref Range   Sodium 138 135 - 145 mmol/L   Potassium 4.6 3.5 - 5.1 mmol/L   Chloride 103 101 - 111 mmol/L   CO2 21 (L) 22 - 32 mmol/L   Glucose, Bld 90 65 - 99 mg/dL   BUN 52 (H) 6 - 20 mg/dL   Creatinine, Ser 2.02 (H) 0.44 - 1.00 mg/dL   Calcium 8.2 (L) 8.9 - 10.3 mg/dL   GFR calc non Af Amer 33 (L) >60 mL/min   GFR calc Af Amer 38 (L) >60 mL/min    Comment: (NOTE) The eGFR has been calculated using the CKD EPI equation. This calculation has not been validated in all clinical situations. eGFR's persistently <60 mL/min signify possible Chronic Kidney Disease.    Anion gap 14 5 - 15  Glucose, capillary     Status: None   Collection Time: 10/28/16  8:03 AM  Result Value Ref Range   Glucose-Capillary 94 65 - 99 mg/dL   Comment 1 Capillary Specimen   Vancomycin, trough     Status: Abnormal   Collection Time: 10/28/16 10:09 AM  Result Value Ref Range   Vancomycin Tr 38 (HH) 15 - 20 ug/mL    Comment: CRITICAL RESULT CALLED TO, READ BACK BY AND VERIFIED WITH: ROCK,B. RN @ 6237 10/28/16 BY EDENS,C.   Glucose, capillary     Status: Abnormal   Collection Time: 10/28/16 12:11 PM  Result Value Ref Range   Glucose-Capillary  125  (H) 65 - 99 mg/dL   Comment 1 Capillary Specimen     Dg Chest Port 1 View  Result Date: 10/28/2016 CLINICAL DATA:  Respiratory difficulty EXAM: PORTABLE CHEST 1 VIEW COMPARISON:  10/27/2016 FINDINGS: Endotracheal and NG tubes are again noted. They are stable. Diffuse bilateral airspace disease with a central prep collection is stable. No pneumothorax. The heart remains enlarged. No definite pleural effusion. IMPRESSION: Improving bilateral diffuse airspace disease. Electronically Signed   By: Marybelle Killings M.D.   On: 10/28/2016 07:51   Dg Chest Port 1 View  Result Date: 10/27/2016 CLINICAL DATA:  Respiratory failure.  Endotracheal tube placement. EXAM: PORTABLE CHEST 1 VIEW COMPARISON:  10/27/2016 FINDINGS: An endotracheal tube is been placed. Tip measures 4 mm above the carina. This measurement may be artifactually reduced due to shallow inspiration and AP technique. Shallow inspiration. Bilateral perihilar airspace consolidation is increasing since previous study. Cardiac enlargement. No pneumothorax. Enteric tube tip is off the field of view but below the left hemidiaphragm. IMPRESSION: Endotracheal tube tip is low, measuring only about 4 mm above the carina. Increasing perihilar consolidation in the lungs bilaterally. Electronically Signed   By: Lucienne Capers M.D.   On: 10/27/2016 04:13   Dg Chest Port 1 View  Result Date: 10/27/2016 CLINICAL DATA:  Respiratory distress tonight EXAM: PORTABLE CHEST 1 VIEW COMPARISON:  10/26/2016 FINDINGS: Shallow inspiration. Cardiac enlargement. Bilateral perihilar airspace disease, greater on the right. Changes suggest pneumonia. Similar appearance to previous study. No pleural effusions. No pneumothorax. Mediastinal contours appear intact. IMPRESSION: Bilateral perihilar infiltrates, greater on the right, suggesting pneumonia. Similar appearance to previous study. Electronically Signed   By: Lucienne Capers M.D.   On: 10/27/2016 01:50   Dg Chest Port 1  View  Result Date: 10/26/2016 CLINICAL DATA:  Shortness of breath and chest pain for 2 days. EXAM: PORTABLE CHEST 1 VIEW COMPARISON:  10/25/2016 FINDINGS: The cardiac silhouette remains mildly enlarged. The lungs remain hypoinflated. There is increasing airspace consolidation in the right perihilar region. Patchy left lung opacities have not significantly changed. No sizable pleural effusion or pneumothorax is identified. No acute osseous abnormality is seen. IMPRESSION: Bilateral airspace opacities with interval worsening on the right, concerning for pneumonia. Electronically Signed   By: Logan Bores M.D.   On: 10/26/2016 19:42   Dg Abd Portable 1v  Result Date: 10/27/2016 CLINICAL DATA:  NG tube placement EXAM: PORTABLE ABDOMEN - 1 VIEW COMPARISON:  None. FINDINGS: Enteric tube tip is in the left upper quadrant consistent with location in the upper stomach. Scattered gas and stool in the colon without distention. IMPRESSION: Enteric tube tip is in the left upper quadrant consistent with location in the upper stomach. Electronically Signed   By: Lucienne Capers M.D.   On: 10/27/2016 04:10   ROS Unable to obtain, as intubated.  Blood pressure 109/60, pulse 95, temperature 99.4 F (37.4 C), temperature source Oral, resp. rate (!) 30, height _0  (1.676 m), weight 255 lb 15.3 oz (116.1 kg), last menstrual period 10/23/2016, SpO2 99 %, not currently breastfeeding. Physical Exam General: Obese. Intubated, sedated.  HEENT: NCAT. Eyes: Bilateral exophthalmos and conjunctival erythema.  Neck: Goiter.  CV: RRR, no mrg Lungs: Diminished/coarse, mechanical breathing sounds.  Abdomen: Obese, soft, NT, ND, +BS.  Extremities: Trace if any LE edema.    Assessment/Plan: Acute Renal Failure - Suspect contrast-induced nephropathy, exacerbated by diuresis. Could also consider ATN after episode of hypotension 11/8. Briefly was on levophed, stopped 11/9 s/p 1 L  fluid and lasix stopped. Vancomycin trough 38 on  11/9; stopped 11/8. Urine protein only 30 on 11/6. Appears euvolemic on exam this afternoon and has produced over 2.5 L UOP already today. Would not continue diuresis at this time. Anticipate improvement in SCr over next few days. Net negative 0.6 L since admission and net negative 2.2 L today. Continue to monitor SCr and UOP.  Hyperkalemia - Suspect lab error. 5.8 this a.m. but 4.6 with recheck without intervention. Continue to monitor.  Graves - Untreated for last 2 weeks. TSH < 0.010 on 11/6, T4 4.77 on 11/7. On methimazole 15 mg TID. Could be contributing to [facility].  Acute respiratory failure s/p intubation - Treatment per CCM. Concern for ARDS. No evidence of PE on CTA. dCHF - Follow CVPs; CVL line placed 11/9. Echo 10/26/16 LVEF 60-65%, Mild AS, mild LAE, Mild TI, PA peak pressure 54 mm Hg. Normal RV. Suspect chronic in nature with poorly controlled HTN, Graves disease.  Fevers - Leukocytosis improving. On vancomycin (11/7>11/8), Zosyn (11/7>>), Azithromycin (11/7>>)  Centre, PGY-2 10/28/2016, 12:36 PM

## 2016-10-28 NOTE — Progress Notes (Addendum)
Pharmacy Antibiotic Note  Tara [name] is a 25 y.o. female admitted on 10/25/2016 with pneumonia.  Pharmacy has been consulted for Vancomycin/Zosyn dosing. D#4 ABX, D#2 vanc/zosyn. Pt also on azithromycin. Patient with significantly worsened renal function today. Current normalized CrCl ~ 47 ml/min. Vancomycin random level today at ~1000 = 38. True trough calculated to be ~33 (using population kinetics, Ke = 0.04341, last dose given today at 0445). Vanc level supratherapeutic (goal trough = 15-20). Pt is febrile, WBC elevated but downtrended to 13.3, last LA 1.1.   Plan: -Hold vancomycin for now -Continue Zosyn 3.375G IV q8h to be infused over 4 hours -0500 Vancomycin random level -Reassess need to re-initiate vancomycin after random level results -Trend WBC, temp, renal function    Height: 5\' 6"  (167.6 cm) Weight: 255 lb 15.3 oz (116.1 kg) IBW/kg (Calculated) : 59.3  Temp (24hrs), Avg:99.6 F (37.6 C), Min:98.7 F (37.1 C), Max:101.2 F (38.4 C)   Recent Labs Lab 10/24/16 1235 10/24/16 1258 10/25/16 1851 10/25/16 1859 10/26/16 0624 10/26/16 0836 10/27/16 0339 10/27/16 0850 10/27/16 1053 10/28/16 0510 10/28/16 0800 10/28/16 1009  WBC 10.3  --  10.3  --   --  9.3  --  16.6*  --  13.3*  --   --   CREATININE 0.56  --  0.65  --   --  0.57  --  0.68 0.57 2.05* 2.02*  --   LATICACIDVEN  --  0.96  --  1.21 1.5  --  1.3 1.1  --   --   --   --   VANCOTROUGH  --   --   --   --   --   --   --   --   --   --   --  38*    Estimated Creatinine Clearance: 54.2 mL/min (by C-G formula based on SCr of 2.02 mg/dL (H)).    Allergies  Allergen Reactions  . Iodine Hives  . Shellfish Allergy Other (See Comments)   Antimicrobials this admission:  Vanc 11/8 >> Zosyn 11/8>> Azithromycin 11/6>> Ceftriaxone 11/6>>11/7  Dose adjustments this admission:  11/9 - Vanc random =38. True trough calculated to be ~33 (using population kinetics, Ke = 0.04341, last dose given today at 0445). -  holding for now  Microbiology results:  11/7 blood x2: ngtd 11/7 MRSA PCR- positive 11/6 blood- ngtd 11/6 resp - GPC/pairs 11/6 urine- neg 11/6 resp panel- neg   Allena Katz, Pharm.D. PGY1 Pharmacy Resident 11/9/201712:33 PM Pager [phone]

## 2016-10-28 NOTE — Progress Notes (Signed)
Nutrition Follow-up  DOCUMENTATION CODES:   Morbid obesity  INTERVENTION:   Increase Vital High Protein to 15 ml/hr (360 ml/day) 60 ml Prostat QID Provides: 1160 kcal, 151 grams protein, and 300 ml H2O.  TF regimen and propofol at current rate providing 1614 total kcal/day   NUTRITION DIAGNOSIS:   Inadequate oral intake related to inability to eat as evidenced by NPO status. Ongoing.   GOAL:   Provide needs based on ASPEN/SCCM guidelines Progressing.   MONITOR:   TF tolerance, Vent status, Weight trends, I & O's  ASSESSMENT:   Pt with hx of recent pregnancy with healthy cesarean delivery 07/2016, 11/5 in ED with suspected PNA now admitted with dyspnea requiring intubation for respiratory failure from pulmonary edema.   Per MD possible ARDS. Spoke with RN, propofol rate has decreased from 11/8.  Renal consulted for possible CRRT  Patient is currently intubated on ventilator support Temp (24hrs), Avg:99.7 F (37.6 C), Min:99.3 F (37.4 C), Max:101.2 F (38.4 C)  Propofol: 17.2 ml/hr provides: 454 kcal per day from lipid Medications reviewed and include: levophed Labs reviewed: K+ 5.8, BUN/Cr 52/2.02 increasing CBG's: 94-125  Diet Order:    NPO  Skin:  Reviewed, no issues (abdominal incision)  Last BM:  11/7  Height:   Ht Readings from Last 1 Encounters:  10/26/16 5\' 6"  (1.676 m)    Weight:   Wt Readings from Last 1 Encounters:  10/28/16 255 lb 15.3 oz (116.1 kg)    Ideal Body Weight:  59 kg  BMI:  Body mass index is 41.31 kg/m.  Estimated Nutritional Needs:   Kcal:  5188-4166  Protein:  >/= 147 grams   Fluid:  >1.5 L/day  EDUCATION NEEDS:   No education needs identified at this time  Kendell Bane RD, LDN, CNSC [phone] Pager [phone] After Hours Pager

## 2016-10-28 NOTE — Progress Notes (Signed)
Two attempts made to contact pts mother for central line placement. Voice mail message left for mother to call 2H back.

## 2016-10-28 NOTE — Progress Notes (Signed)
Per Dr Molli Knock, no vent changes at this time s/p ABG results at 12:49.

## 2016-10-28 NOTE — Progress Notes (Signed)
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 Dad is able to be out of work for 2 weeks to care for the baby, but has concerns about their situation once he has to return to work. A social work consult was placed to discuss options.  The family currently lives in a [facility] shelter, and their Family Advocate, Dorris Fetch, is requesting a letter from MD stating that Ms. Isbill is critically ill and currently hospitalized. Ondrea's cell number is [phone] and her office number is [phone].

## 2016-10-28 NOTE — Progress Notes (Signed)
eLink Physician-Brief Progress Note Patient Name: Tara [name] DOB: 1991/02/15 MRN: [id number]   Date of Service  10/28/2016  HPI/Events of Note  Oliguria - no CVP available. LVEF - 60% to 65%.  eICU Interventions  Will order: 1. Bolus with 0.9 NaCl 1 liter IV over 1 hour now.      Intervention Category Intermediate Interventions: Oliguria - evaluation and management  Sommer,Steven Eugene 10/28/2016, 12:57 AM

## 2016-10-29 ENCOUNTER — Inpatient Hospital Stay (HOSPITAL_COMMUNITY): Payer: Medicaid Other

## 2016-10-29 DIAGNOSIS — I272 Pulmonary hypertension, unspecified: Secondary | ICD-10-CM

## 2016-10-29 LAB — BLOOD GAS, ARTERIAL
ACID-BASE EXCESS: 4.5 mmol/L — AB (ref 0.0–2.0)
BICARBONATE: 29.8 mmol/L — AB (ref 20.0–28.0)
Drawn by: 252031
FIO2: 40
LHR: 30 {breaths}/min
O2 SAT: 99.1 %
PATIENT TEMPERATURE: 99
PCO2 ART: 56.6 mmHg — AB (ref 32.0–48.0)
PEEP/CPAP: 8 cmH2O
PH ART: 7.342 — AB (ref 7.350–7.450)
VT: 360 mL
pO2, Arterial: 161 mmHg — ABNORMAL HIGH (ref 83.0–108.0)

## 2016-10-29 LAB — MAGNESIUM: Magnesium: 3 mg/dL — ABNORMAL HIGH (ref 1.7–2.4)

## 2016-10-29 LAB — GLUCOSE, CAPILLARY
GLUCOSE-CAPILLARY: 121 mg/dL — AB (ref 65–99)
GLUCOSE-CAPILLARY: 142 mg/dL — AB (ref 65–99)
GLUCOSE-CAPILLARY: 158 mg/dL — AB (ref 65–99)
Glucose-Capillary: 132 mg/dL — ABNORMAL HIGH (ref 65–99)
Glucose-Capillary: 118 mg/dL — ABNORMAL HIGH (ref 65–99)
Glucose-Capillary: 130 mg/dL — ABNORMAL HIGH (ref 65–99)

## 2016-10-29 LAB — CBC
HEMATOCRIT: 35.3 % — AB (ref 36.0–46.0)
Hemoglobin: 10.6 g/dL — ABNORMAL LOW (ref 12.0–15.0)
MCH: 21.7 pg — AB (ref 26.0–34.0)
MCHC: 30 g/dL (ref 30.0–36.0)
MCV: 72.2 fL — AB (ref 78.0–100.0)
Platelets: 259 10*3/uL (ref 150–400)
RBC: 4.89 MIL/uL (ref 3.87–5.11)
RDW: 15.3 % (ref 11.5–15.5)
WBC: 10.4 10*3/uL (ref 4.0–10.5)

## 2016-10-29 LAB — BASIC METABOLIC PANEL
ANION GAP: 9 (ref 5–15)
BUN: 51 mg/dL — AB (ref 6–20)
CHLORIDE: 109 mmol/L (ref 101–111)
CO2: 28 mmol/L (ref 22–32)
Calcium: 9 mg/dL (ref 8.9–10.3)
Creatinine, Ser: 1.5 mg/dL — ABNORMAL HIGH (ref 0.44–1.00)
GFR calc Af Amer: 54 mL/min — ABNORMAL LOW (ref >60)
GFR calc non Af Amer: 47 mL/min — ABNORMAL LOW (ref >60)
GLUCOSE: 116 mg/dL — AB (ref 65–99)
POTASSIUM: 3.9 mmol/L (ref 3.5–5.1)
Sodium: 146 mmol/L — ABNORMAL HIGH (ref 135–145)

## 2016-10-29 LAB — COOXEMETRY PANEL
CARBOXYHEMOGLOBIN: 1.7 % — AB (ref 0.5–1.5)
Methemoglobin: 0.7 % (ref 0.0–1.5)
O2 SAT: 93.3 %
TOTAL HEMOGLOBIN: 11 g/dL — AB (ref 12.0–16.0)

## 2016-10-29 LAB — CULTURE, RESPIRATORY W GRAM STAIN: Culture: NORMAL

## 2016-10-29 LAB — PHOSPHORUS: Phosphorus: 5.2 mg/dL — ABNORMAL HIGH (ref 2.5–4.6)

## 2016-10-29 LAB — VANCOMYCIN, RANDOM: Vancomycin Rm: 10

## 2016-10-29 LAB — CULTURE, RESPIRATORY: SPECIAL REQUESTS: NORMAL

## 2016-10-29 LAB — TSH: TSH: <0.01 u[IU]/mL — ABNORMAL LOW (ref 0.350–4.500)

## 2016-10-29 MED ORDER — FUROSEMIDE 10 MG/ML IJ SOLN: 40.0000 mg | Freq: Four times a day (QID) | INTRAMUSCULAR | Status: DC

## 2016-10-29 MED ORDER — VANCOMYCIN HCL IN DEXTROSE 750-5 MG/150ML-% IV SOLN
750.0000 mg | Freq: Two times a day (BID) | INTRAVENOUS | Status: DC
  Administered 2016-10-29 – 2016-10-30 (×3): 750 mg via INTRAVENOUS
  Filled 2016-10-29 (×4): qty 150

## 2016-10-29 MED ORDER — FAMOTIDINE 40 MG/5ML PO SUSR
20.0000 mg | Freq: Two times a day (BID) | ORAL | Status: DC
  Administered 2016-10-29 – 2016-10-31 (×5): 20 mg
  Filled 2016-10-29 (×7): qty 2.5

## 2016-10-29 MED ORDER — MIDAZOLAM HCL 2 MG/2ML IJ SOLN
1.0000 mg | INTRAMUSCULAR | Status: DC | PRN
  Administered 2016-10-29 – 2016-10-30 (×4): 2 mg via INTRAVENOUS
  Filled 2016-10-29 (×4): qty 2

## 2016-10-29 NOTE — Progress Notes (Signed)
eLink Physician-Brief Progress Note Patient Name: Tara [name] DOB: Apr 24, 1991 MRN: [id number]   Date of Service  10/29/2016  HPI/Events of Note  Fever to 102.5 F - Already on Zosyn and Azithromycin. Tylenol given and not effective in lowering Temp.   eICU Interventions  Will order: 1. Cooling Blanket. 2. Blood Cultures x 2.      Intervention Category Major Interventions: Infection - evaluation and management  Sommer,Steven Eugene 10/29/2016, 12:32 AM

## 2016-10-29 NOTE — Progress Notes (Signed)
Pharmacy Antibiotic Note  Tara [name] is a 25 y.o. female admitted on 10/25/2016 with pneumonia.  Pharmacy has been consulted for Vancomycin/Zosyn dosing.  11/8: Scr 0.57, pt started vancomycin   11/9: SCr quadrupled to 2.05 and vancomycin random level was elevated at 38  11/10: Scr has came down to 1.5 and random vancomycin level is 10  Antimicrobials this admission: Vanc 11/8 >> Zosyn 11/8>> Azithromycin 11/6>> Ceftriaxone 11/6>>11/7  Dose adjustments this admission: 11/9 Vancomycin random: 38 (hold vanco) 11/10 Vancomycin random: 10 (re-start vancomycin at 750 mg IV q12h)  Plan: -Re-start vancomycin at 750 mg IV q12h, will need dose increase if Scr continues to trend down -Continue Zosyn/Azithromycin as ordered -Re-check drug levels as indicated   Height: 5\' 6"  (167.6 cm) Weight: 255 lb 15.3 oz (116.1 kg) IBW/kg (Calculated) : 59.3  Temp (24hrs), Avg:101.2 F (38.4 C), Min:99.4 F (37.4 C), Max:102.7 F (39.3 C)   Recent Labs Lab 10/24/16 1258 10/25/16 1851 10/25/16 1859 10/26/16 0624 10/26/16 0836 10/27/16 0339 10/27/16 0850 10/27/16 1053 10/28/16 0510 10/28/16 0800 10/28/16 1009 10/29/16 0223  WBC  --  10.3  --   --  9.3  --  16.6*  --  13.3*  --   --  10.4  CREATININE  --  0.65  --   --  0.57  --  0.68 0.57 2.05* 2.02*  --  1.50*  LATICACIDVEN 0.96  --  1.21 1.5  --  1.3 1.1  --   --   --   --   --   VANCOTROUGH  --   --   --   --   --   --   --   --   --   --  66*  --   VANCORANDOM  --   --   --   --   --   --   --   --   --   --   --  10    Estimated Creatinine Clearance: 72.9 mL/min (by C-G formula based on SCr of 1.5 mg/dL (H)).    Allergies  Allergen Reactions  . Iodine Hives  . Shellfish Allergy Other (See Comments)     Abran Duke 10/29/2016 3:49 AM

## 2016-10-29 NOTE — Progress Notes (Signed)
Pharmacy Antibiotic Note  Tara [name] is a 25 y.o. female admitted on 10/25/2016 with pneumonia.  Pharmacy has been consulted for Vancomycin.  Random Vancomycin level of 10 this morning ~ 24h after last dose.  SCr improved from yesterday  Plan: Vancomycin 750 mg IV q12h F/U renal function and adjust dose if SCr continues to improve   Height: 5\' 6"  (167.6 cm) Weight: 255 lb 15.3 oz (116.1 kg) IBW/kg (Calculated) : 59.3  Temp (24hrs), Avg:101.2 F (38.4 C), Min:99.4 F (37.4 C), Max:102.7 F (39.3 C)   Recent Labs Lab 10/24/16 1258 10/25/16 1851 10/25/16 1859 10/26/16 0624 10/26/16 0836 10/27/16 0339 10/27/16 0850 10/27/16 1053 10/28/16 0510 10/28/16 0800 10/28/16 1009 10/29/16 0223  WBC  --  10.3  --   --  9.3  --  16.6*  --  13.3*  --   --  10.4  CREATININE  --  0.65  --   --  0.57  --  0.68 0.57 2.05* 2.02*  --  1.50*  LATICACIDVEN 0.96  --  1.21 1.5  --  1.3 1.1  --   --   --   --   --   VANCOTROUGH  --   --   --   --   --   --   --   --   --   --  56*  --   VANCORANDOM  --   --   --   --   --   --   --   --   --   --   --  10    Estimated Creatinine Clearance: 72.9 mL/min (by C-G formula based on SCr of 1.5 mg/dL (H)).    Allergies  Allergen Reactions  . Iodine Hives  . Shellfish Allergy Other (See Comments)   Antimicrobials this admission:  Vanc 11/8 >> Zosyn 11/8>> Azithromycin 11/6>> Ceftriaxone 11/6>>11/7  Dose adjustments this admission:  11/9 - Vanc random =38. True trough calculated to be ~33 (using population kinetics, Ke = 0.04341, last dose given today at 0445). - holding for now  Microbiology results:  11/7 blood x2: ngtd 11/7 MRSA PCR- positive 11/6 blood- ngtd 11/6 resp - GPC/pairs 11/6 urine- neg 11/6 resp panel- neg  Geannie Risen, PharmD, BCPS  10/29/2016 3:51 AM

## 2016-10-29 NOTE — Progress Notes (Deleted)
Name: Tara [name] MRN: [id number] DOB: 23-Jun-1991    ADMISSION DATE:  10/25/2016 CONSULTATION DATE:  10/25/2016  REFERRING MD :  Dr. Montez Morita [facility]  CHIEF COMPLAINT:  SOB, cough  HISTORY OF PRESENT ILLNESS:  25 year old female with PMH as below, which is significant for recent pregnancy with healthy cesarean delivery. Pregnancy complicated by pre-eclampsia. Her post-partum course was complicated by pneumonia, but there is some thought that there may have been post-partum cardiomyopathy at that time as well. This is not supported by the EMR. She presented to [facility] ED initially 11/5 with complaints of SOB and productive cough. CXR was done demonstrating bilateral airspace opacifications read as pneumonia. She was given IM ceftriaxone, a Z-pack, and was discharged. She presented again to [facility] ED again 11/6 with similar complaints. CXR was again positive for bilateral opacifications. O2 sats were 85% on room air and she had increased work of breathing. Sats improved on 6L [place], but she continued to have dyspnea. There was concern for PE so she was pre-medicated due to iodine allergy. No scan at this time. Due to complexity and no improvement on outpatient antibiotics, PCCM consulted for further assistance.   She describes 3 days of worsening cough, pick/blood streak sputum, and dyspnea. Ankles swollen, however, this has been going on for some time. Endorses fevers/chills at home. Laboratory eval in ED significant for WBC only 10.3, Hgb 10.4, K 3.2, BNP 205.8.   SIGNIFICANT EVENTS  07/2016 delivery of healthy baby boy, post partum course complicated by PNA 11/5 ED for SOB, thought to be CAP and discharged with ABX 11/6 back to ED with worsening dyspnea.   STUDIES:  CTA chest 11/6 > negative for PE  SUBJECTIVE:  Diuresis stopped for hypotension  VITAL SIGNS: Temp:  [98.4 F (36.9 C)-102.7 F (39.3 C)] 98.4 F (36.9 C) (11/10 0800) Pulse Rate:  [90-122] 103 (11/10 0900) Resp:   [23-34] 30 (11/10 0900) BP: (89-130)/(47-83) 130/81 (11/10 0900) SpO2:  [99 %-100 %] 100 % (11/10 0900) FiO2 (%):  [40 %] 40 % (11/10 0809) Weight:  [115.7 kg (255 lb 1.2 oz)] 115.7 kg (255 lb 1.2 oz) (11/10 0400)  PHYSICAL EXAMINATION: General:  Obese female sedated on vent Neuro:  Sedate, not following commands on propofol HEENT:  [place]/AT, PERRL, exophthalmus, no obvious JVD Cardiovascular:  Tachy, regular. +2 BLE edema, now of levo drip Lungs:  Coarse crackles entire posterior chest Abdomen:  Soft, non-tender, non-distended Musculoskeletal:  No acute deformity Skin:  Grossly intact  Recent Labs Lab 10/28/16 0510 10/28/16 0800 10/29/16 0223  NA 137 138 146*  K 5.8* 4.6 3.9  CL 103 103 109  CO2 25 21* 28  BUN 49* 52* 51*  CREATININE 2.05* 2.02* 1.50*  GLUCOSE 101* 90 116*    Recent Labs Lab 10/27/16 0850 10/28/16 0510 10/29/16 0223  HGB 10.2* 11.4* 10.6*  HCT 32.9* 39.4 35.3*  WBC 16.6* 13.3* 10.4  PLT 256 296 259   Dg Chest Port 1 View  Result Date: 10/29/2016 CLINICAL DATA:  Endotracheal tube.  Shortness of breath. EXAM: PORTABLE CHEST 1 VIEW COMPARISON:  10/28/2016 FINDINGS: Endotracheal tube remains in place with tip 2.5 cm above the carina. The left jugular venous catheter remains in place with tip overlying the SVC. Enteric tube courses into the left upper abdomen with tip not imaged. The cardiomediastinal silhouette is unchanged. Perihilar predominant bilateral airspace opacities have minimally improved. No sizable pleural effusion or pneumothorax is identified. IMPRESSION: Slight further improvement of  bilateral airspace disease. Electronically Signed   By: Sebastian AcheAllen  Grady M.D.   On: 10/29/2016 07:29   Dg Chest Port 1 View  Result Date: 10/28/2016 CLINICAL DATA:  Hypoxia.  Central catheter placement EXAM: PORTABLE CHEST 1 VIEW COMPARISON:  Study obtained earlier in the day FINDINGS: Central catheter tip is in the superior vena cava. Endotracheal tube tip is 3.1 cm  above the carina. Nasogastric tube tip and side port are below the diaphragm. No pneumothorax. There is patchy interstitial and alveolar edema, essentially stable. There is cardiomegaly with pulmonary venous hypertension. No adenopathy evident. IMPRESSION: Tube and catheter positions as described without pneumothorax. Persistent interstitial and patchy alveolar opacity, felt to be consistent with edema/in chest of heart failure. Stable cardiac silhouette. Should be noted that pneumonia superimposed on edema cannot be excluded radiographically. Electronically Signed   By: Bretta BangWilliam  Woodruff III M.D.   On: 10/28/2016 14:21   Dg Chest Port 1 View  Result Date: 10/28/2016 CLINICAL DATA:  Respiratory difficulty EXAM: PORTABLE CHEST 1 VIEW COMPARISON:  10/27/2016 FINDINGS: Endotracheal and NG tubes are again noted. They are stable. Diffuse bilateral airspace disease with a central prep collection is stable. No pneumothorax. The heart remains enlarged. No definite pleural effusion. IMPRESSION: Improving bilateral diffuse airspace disease. Electronically Signed   By: Jolaine ClickArthur  Hoss M.D.   On: 10/28/2016 07:51   I reviewed chest CT myself, no PE, infiltrate noted.  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  ASSESSMENT / PLAN:  PULMONARY A: Acute pulmonary edema leading to respiratory failure, procalcitonin <0.1, afebrile, unlikely infection, cultures sent. 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 - Auto-immune work up for pulmonary HTN - No PE noted- did not tolerate diuresis -place cvl to guide diuresis  RENAL Lab Results  Component Value Date   CREATININE 1.50 (H) 10/29/2016   CREATININE 2.02 (H) 10/28/2016   CREATININE 2.05 (H) 10/28/2016   CREATININE 0.40 (L) 02/26/2016    Recent Labs Lab 10/28/16 0510 10/28/16 0800 10/29/16 0223  K 5.8* 4.6 3.9    Renal insuff Hperkalemia  A:   No active issues P:   - Lasix 40 mg IV q6 x3 doses. STOPPED DUE TO HYPOTENSION 11/8 ANFD ivf GIVEN - BMET in AM. -renal consult called 11/9. May need CRRT  GASTROINTESTINAL A:   No active issues P:   - TF per nutrition.  HEMATOLOGIC A:   Leukocytosis, likely a stress response P:  - CBC in AM  INFECTIOUS A:   Doubt PNA but c x r with ards type pattern P:   - F/U cultures, 11/9 ngtd - On zithro  ENDOCRINE A:   No active issues   P:   - Monitor  NEUROLOGIC A:   Severe anxiety P:   RASS goal: 0 to -1 - Propofol drip - Fentanyl drip. - Will need benzos when extubated.  FAMILY  - Updates: No family  bedside  - Inter-disciplinary family meet or Palliative Care meeting due by:    The patient is critically ill with multiple organ systems failure and requires high complexity decision making for assessment and support, frequent evaluation and titration of therapies, application of advanced monitoring technologies and extensive interpretation of multiple databases.   Critical Care Time devoted to patient care services described in this note is  35  Minutes. This time reflects time of care of this signee Dr Koren Bound. This critical care time does not reflect procedure time, or teaching time or supervisory time of PA/NP/Med student/Med Resident etc but could involve care discussion time.  Alyson Reedy, M.D. [facility] Pulmonary/Critical  Care Medicine. Pager: [phone]. After hours pager: [phone].

## 2016-10-29 NOTE — Progress Notes (Signed)
eLink Physician-Brief Progress Note Patient Name: Tara [name] DOB: 12-26-1990 MRN: [id number]   Date of Service  10/29/2016  HPI/Events of Note  Diarrhea - request for Flexiseal.   eICU Interventions  Will order Flexiseal.      Intervention Category Intermediate Interventions: Other:  Lenell Antu 10/29/2016, 11:41 PM

## 2016-10-29 NOTE — Progress Notes (Signed)
[facility] Progress Note   Subjective:   Remains intubated and sedated.    Objective:   BP 127/77 (BP Location: Left Arm)   Pulse (!) 102   Temp 99.1 F (37.3 C)   Resp (!) 23   Ht '5\' 6"'$  (1.676 m)   Wt 255 lb 1.2 oz (115.7 kg) Comment: with cooling blanket  LMP 10/23/2016   SpO2 99%   BMI 41.17 kg/m   Intake/Output Summary (Last 24 hours) at 10/29/16 0759 Last data filed at 10/29/16 0700  Gross per 24 hour  Intake          2168.12 ml  Output             6490 ml  Net         -4321.88 ml   Weight change: -14.1 oz (-0.4 kg)   HEMODYNAMICS: CVP:  [9 mmHg-19 mmHg] 12 mmHg  Physical Exam: Physical Exam General: Obese. Intubated, sedated.  HEENT: NCAT. Eyes: Bilateral exophthalmos and conjunctival erythema.  Neck: Goiter.  CV: RRR, no mrg Lungs: Diminished/coarse, mechanical breathing sounds.  Abdomen: Obese, soft, NT, ND, +BS.  Extremities: Trace if any LE edema.  Neuro: Blinked against eye exam.    Imaging: Dg Chest Port 1 View  Result Date: 10/29/2016 CLINICAL DATA:  Endotracheal tube.  Shortness of breath. EXAM: PORTABLE CHEST 1 VIEW COMPARISON:  10/28/2016 FINDINGS: Endotracheal tube remains in place with tip 2.5 cm above the carina. The left jugular venous catheter remains in place with tip overlying the SVC. Enteric tube courses into the left upper abdomen with tip not imaged. The cardiomediastinal silhouette is unchanged. Perihilar predominant bilateral airspace opacities have minimally improved. No sizable pleural effusion or pneumothorax is identified. IMPRESSION: Slight further improvement of bilateral airspace disease. Electronically Signed   By: Logan Bores M.D.   On: 10/29/2016 07:29   Dg Chest Port 1 View  Result Date: 10/28/2016 CLINICAL DATA:  Hypoxia.  Central catheter placement EXAM: PORTABLE CHEST 1 VIEW COMPARISON:  Study obtained earlier in the day FINDINGS: Central catheter tip is in the superior vena cava. Endotracheal tube tip  is 3.1 cm above the carina. Nasogastric tube tip and side port are below the diaphragm. No pneumothorax. There is patchy interstitial and alveolar edema, essentially stable. There is cardiomegaly with pulmonary venous hypertension. No adenopathy evident. IMPRESSION: Tube and catheter positions as described without pneumothorax. Persistent interstitial and patchy alveolar opacity, felt to be consistent with edema/in chest of heart failure. Stable cardiac silhouette. Should be noted that pneumonia superimposed on edema cannot be excluded radiographically. Electronically Signed   By: Lowella Grip III M.D.   On: 10/28/2016 14:21   Dg Chest Port 1 View  Result Date: 10/28/2016 CLINICAL DATA:  Respiratory difficulty EXAM: PORTABLE CHEST 1 VIEW COMPARISON:  10/27/2016 FINDINGS: Endotracheal and NG tubes are again noted. They are stable. Diffuse bilateral airspace disease with a central prep collection is stable. No pneumothorax. The heart remains enlarged. No definite pleural effusion. IMPRESSION: Improving bilateral diffuse airspace disease. Electronically Signed   By: Marybelle Killings M.D.   On: 10/28/2016 07:51    Labs: BMET  Recent Labs Lab 10/25/16 1851 10/26/16 0836 10/27/16 0850 10/27/16 1053 10/27/16 1624 10/28/16 0510 10/28/16 0800 10/28/16 1700 10/29/16 0223  NA 140 141 140 140  --  137 138  --  146*  K 3.2* 3.8 4.5 4.3  --  5.8* 4.6  --  3.9  CL 109 110 106 106  --  103 103  --  109  CO2 '24 22 25 24  '$ --  25 21*  --  28  GLUCOSE 137* 170* 128* 115*  --  101* 90  --  116*  BUN '9 10 18 16  '$ --  49* 52*  --  51*  CREATININE 0.65 0.57 0.68 0.57  --  2.05* 2.02*  --  1.50*  CALCIUM 8.7* 8.9 8.7* 8.7*  --  8.3* 8.2*  --  9.0  PHOS  --   --  4.8* 4.0 6.6* 9.1*  --  5.2* 5.2*   CBC  Recent Labs Lab 10/24/16 1235 10/25/16 1851 10/26/16 0836 10/27/16 0850 10/28/16 0510 10/29/16 0223  WBC 10.3 10.3 9.3 16.6* 13.3* 10.4  NEUTROABS 8.2* 7.8* 8.0*  --   --   --   HGB 10.9* 10.4* 10.5*  10.2* 11.4* 10.6*  HCT 34.5* 32.5* 33.3* 32.9* 39.4 35.3*  MCV 69.1* 69.7* 70.3* 70.9* 76.4* 72.2*  PLT 251 234 239 256 296 259    Recent Labs Lab 10/28/16 0552 10/28/16 1249 10/29/16 0338  PHART 7.261* 7.323* 7.342*  PCO2ART 64.6* 57.4* 56.6*  PO2ART 182.0* 132.0* 161*    Medications:    . azithromycin  500 mg Intravenous Q24H  . chlorhexidine gluconate (MEDLINE KIT)  15 mL Mouth Rinse BID  . Chlorhexidine Gluconate Cloth  6 each Topical Q0600  . enoxaparin (LOVENOX) injection  40 mg Subcutaneous Q24H  . famotidine  20 mg Per Tube BID  . feeding supplement (PRO-STAT SUGAR FREE 64)  60 mL Per Tube QID  . feeding supplement (VITAL HIGH PROTEIN)  1,000 mL Per Tube Q24H  . mouth rinse  15 mL Mouth Rinse 10 times per day  . methimazole  15 mg Oral TID  . mupirocin ointment  1 application Nasal BID  . piperacillin-tazobactam (ZOSYN)  IV  3.375 g Intravenous Q8H  . sodium chloride flush  10-40 mL Intracatheter Q12H  . vancomycin  750 mg Intravenous Q12H   Background: 25 y.o. female with PMH of Graves' disease, HTN, and recent Cesarean delivery (04/28/15) complicated by post-partum HCAP (August) who presented for fever, cough, and worsening shortness of breath. Progressed to VDRF and developed nonoliguric AKI. Had contrast exposure 11/6 and has had diuresis and hypotension briefly req vasopressors.  AHF and PCCM following for sepsis/ARDS and concern for diastolic HF exacerbation but BP is on low side. Question of possible thyroid storm.   Assessment/ Plan:   Acute Renal Failure - SCr improving. 2.05 > 1.50. Suspect contrast-induced nephropathy, exacerbated by diuresis. Could also consider ATN after episode of hypotension 11/8. Briefly was on levophed, stopped 11/9 s/p 1 L fluid and lasix stopped. Vancomycin trough 38 on 11/9; resumed 11/10 with vanc trough of 10. Urine protein only 30 on 11/6. Appears euvolemic on exam UOP 6.5L yesterday. Would not continue diuresis at this time. Net  negative 2.8 L since admission and net negative 4.3 L yesterday. Continue to monitor SCr and UOP. Anticipate continued improvement in SCr over next few days.  Graves - Untreated for last 2 weeks. TSH < 0.010 on 11/6, T4 4.77 on 11/7. On methimazole 15 mg TID. Could be contributing to [facility].  Acute respiratory failure s/p intubation - Treatment per CCM. Concern for ARDS. No evidence of PE on CTA. dCHF - Follow CVPs; CVL line placed 11/9. Echo 10/26/16 LVEF 60-65%, Mild AS, mild LAE, Mild TI, PA peak pressure 54 mm Hg. Normal RV. Suspect chronic in nature with poorly controlled HTN, Graves disease. Could obtain co-ox to  better determine degree of heart failure contribution to severity of illness. Fevers - Leukocytosis improving. On vancomycin (11/7>>), Zosyn (11/7>>), Azithromycin (11/7>>)  Olene Floss, MD [place], PGY-2 10/29/2016, 7:59 AM   I have seen and examined this patient and agree with plan and assessment in the above note with renal recommendations/intervention highlighted. Renal function improving, "autodiuresing" with 6.5 liters of UOP without diuretics. Renal course appears c/w contrast nephropathy.  Renal will sign off at this time - please call if further questions or if we can help.  Albeiro Trompeter B,MD 10/29/2016 10:14 AM

## 2016-10-29 NOTE — Progress Notes (Signed)
Name: Tara [name] MRN: [id number] DOB: [birth year]    ADMISSION DATE:  10/25/2016 CONSULTATION DATE:  10/25/2016  REFERRING MD :  Dr. Montez Moritaarter [facility]  CHIEF COMPLAINT:  SOB, cough  HISTORY OF PRESENT ILLNESS:  25 year old female with PMH as below, which is significant for recent pregnancy with healthy cesarean delivery. Pregnancy complicated by pre-eclampsia. Her post-partum course was complicated by pneumonia, but there is some thought that there may have been post-partum cardiomyopathy at that time as well. This is not supported by the EMR. [facility] to [facility] 11/5 with complaints of SOB and productive cough. CXR was done demonstrating bilateral airspace opacifications read as pneumonia. She was given IM ceftriaxone, a Z-pack, and was discharged. She presented again to [facility] again 11/6 with similar complaints. CXR was again positive for bilateral opacifications. O2 sats were 85% on room air and she had increased work of breathing. Sats improved on 6L [place], but she continued to have dyspnea. There was concern for PE so she was pre-medicated due to iodine allergy. No scan at this time. Due to complexity and no improvement on outpatient antibiotics, PCCM consulted for further assistance.   She describes 3 days of worsening cough, pick/blood streak sputum, and dyspnea. Ankles swollen, however, this has been going on for some time. Endorses fevers/chills at home. Laboratory eval in ED significant for WBC only 10.3, Hgb 10.4, K 3.2, BNP 205.8.   SIGNIFICANT EVENTS  07/2016 delivery of healthy baby boy, post partum course complicated by PNA 11/5 ED for SOB, thought to be CAP and discharged with ABX 11/6 back to ED with worsening dyspnea.   STUDIES:  CTA chest 11/6 > negative for PE  SUBJECTIVE:  Diuresis stopped for hypotension  VITAL SIGNS: Temp:  [98.4 F (36.9 C)-102.7 F (39.3 C)] 98.4 F (36.9 C) (11/10 0800) Pulse Rate:  [90-122] 103 (11/10 0900) Resp:   [23-34] 30 (11/10 0900) BP: (89-130)/(47-83) 130/81 (11/10 0900) SpO2:  [99 %-100 %] 100 % (11/10 0900) FiO2 (%):  [40 %] 40 % (11/10 0809) Weight:  [255 lb 1.2 oz (115.7 kg)] 255 lb 1.2 oz (115.7 kg) (11/10 0400)  PHYSICAL EXAMINATION: General:  Obese female sedated on vent. Neuro:  Sedate, not following commands on propofol, when sedation light she is anxious HEENT:  /AT, PERRL, exophthalmus, no obvious JVD Cardiovascular:  Tachy, regular. +2 BLE edema, Lungs:  Coarse crackles and decreased bs  Abdomen:  Soft, non-tender, non-distended Musculoskeletal:  No acute deformity Skin:  Grossly intact  Recent Labs Lab 10/28/16 0510 10/28/16 0800 10/29/16 0223  NA 137 138 146*  K 5.8* 4.6 3.9  CL 103 103 109  CO2 25 21* 28  BUN 49* 52* 51*  CREATININE 2.05* 2.02* 1.50*  GLUCOSE 101* 90 116*   Recent Labs Lab 10/27/16 0850 10/28/16 0510 10/29/16 0223  HGB 10.2* 11.4* 10.6*  HCT 32.9* 39.4 35.3*  WBC 16.6* 13.3* 10.4  PLT 256 296 259   Dg Chest Port 1 View  Result Date: 10/29/2016 CLINICAL DATA:  Endotracheal tube.  Shortness of breath. EXAM: PORTABLE CHEST 1 VIEW COMPARISON:  10/28/2016 FINDINGS: Endotracheal tube remains in place with tip 2.5 cm above the carina. The left jugular venous catheter remains in place with tip overlying the SVC. Enteric tube courses into the left upper abdomen with tip not imaged. The cardiomediastinal silhouette is unchanged. Perihilar predominant bilateral airspace opacities have minimally improved. No sizable pleural effusion or pneumothorax is identified. IMPRESSION: Slight further  improvement of bilateral airspace disease. Electronically Signed   By: Sebastian Ache M.D.   On: 10/29/2016 07:29   Dg Chest Port 1 View  Result Date: 10/28/2016 CLINICAL DATA:  Hypoxia.  Central catheter placement EXAM: PORTABLE CHEST 1 VIEW COMPARISON:  Study obtained earlier in the day FINDINGS: Central catheter tip is in the superior vena cava. Endotracheal tube  tip is 3.1 cm above the carina. Nasogastric tube tip and side port are below the diaphragm. No pneumothorax. There is patchy interstitial and alveolar edema, essentially stable. There is cardiomegaly with pulmonary venous hypertension. No adenopathy evident. IMPRESSION: Tube and catheter positions as described without pneumothorax. Persistent interstitial and patchy alveolar opacity, felt to be consistent with edema/in chest of heart failure. Stable cardiac silhouette. Should be noted that pneumonia superimposed on edema cannot be excluded radiographically. Electronically Signed   By: Bretta Bang III M.D.   On: 10/28/2016 14:21   Dg Chest Port 1 View  Result Date: 10/28/2016 CLINICAL DATA:  Respiratory difficulty EXAM: PORTABLE CHEST 1 VIEW COMPARISON:  10/27/2016 FINDINGS: Endotracheal and NG tubes are again noted. They are stable. Diffuse bilateral airspace disease with a central prep collection is stable. No pneumothorax. The heart remains enlarged. No definite pleural effusion. IMPRESSION: Improving bilateral diffuse airspace disease. Electronically Signed   By: Jolaine Click M.D.   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 CVL placed 11/9 for guidance of diuresis.  ASSESSMENT / PLAN:  PULMONARY A: Acute pulmonary edema leading to respiratory failure, procalcitonin <0.1, afebrile, unlikely infection, cultures sent. BASDZ P:   - Full vent support - Decrease PEEP currently 10 will decrease to 8 - Hold off weaning for now - Daily wua -  Diureses when able to tolerate - CVP of 11  CARDIOVASCULAR A:  Post  partum cardiomyopathy, ?myocarditis. Pulmonary HTN EF 60-65% P:  - Cardiology consult appreciated - Auto-immune work up for pulmonary HTN in setting of graves dz - No PE noted- did not tolerate diuresis  RENAL Lab Results  Component Value Date   CREATININE 1.50 (H) 10/29/2016   CREATININE 2.02 (H) 10/28/2016   CREATININE 2.05 (H) 10/28/2016   CREATININE 0.40 (L) 02/26/2016    Recent Labs Lab 10/28/16 0510 10/28/16 0800 10/29/16 0223  K 5.8* 4.6 3.9    Renal insuff Hperkalemia  A:   No active issues P:   - Resume lasix with goal of cvp 4. May need pressors to main map - BMET in AM. - Renal consult called 11/9. May need CRRT but holding off for now  GASTROINTESTINAL A:   No active issues P:   - TF per nutrition.  HEMATOLOGIC A:   Leukocytosis, likely a stress response P:  - CBC in AM  INFECTIOUS A:   Doubt PNA but c x r with ards type pattern P:   - F/U cultures, 11/9 ngtd - On zithro/vanc/zosyn.  Stop date for zithromax  ENDOCRINE A:   Graves dz P:   - Continue tapazole - Check TSH  NEUROLOGIC A:   Severe anxiety P:   RASS goal: 0 to -1 - Propofol drip, d/c and start versed pushes - Fentanyl drip. -  Will need benzos when extubated.  FAMILY  - Updates: No family bedside  - Inter-disciplinary family meet or Palliative Care meeting due by:    Brett Canales Minor ACNP [facility] PCCM Pager [phone] till 3 pm If no answer page [phone] 10/29/2016, 9:32 AM  Attending Note:  25 year old female with post partum cardiomyopathy and pulmonary edema, now started on pressors for shock that is improving, not sure if cardiac or septic.  On exam, sedate and not tachypnic on sedation, lungs with diffuse crackles.  I reviewed CXR myself, ETT ok.  Infiltrate noted.  ?ARDS.  Will continue broad spectrum abx.  Placed stop date on zithromax for 5 days.  Continue vanc/zosyn.  F/U on culture.  Active diureses today.  Lasix as ordered.  No role for HD at this time.  No  family bedside to update.  Hold off weaning for today.  The patient is critically ill with multiple organ systems failure and requires high complexity decision making for assessment and support, frequent evaluation and titration of therapies, application of advanced monitoring technologies and extensive interpretation of multiple databases.   Critical Care Time devoted to patient care services described in this note is  35  Minutes. This time reflects time of care of this signee Dr Koren Bound. This critical care time does not reflect procedure time, or teaching time or supervisory time of PA/NP/Med student/Med Resident etc but could involve care discussion time.  Alyson Reedy, M.D. [facility] Pulmonary/Critical Care Medicine. Pager: [phone]. After hours pager: [phone].

## 2016-10-29 NOTE — Progress Notes (Signed)
Advanced Heart Failure Rounding Note  Referring Physician: Dr. Corrie Dandy Primary Physician: None per patient.  Primary Cardiologist:  None  Reason for Consultation: ? Post-partum cardiomyopathy vs Diastolic HF  Subjective:    Echo 10/26/16 LVEF 60-65%, Mild AS, mild LAE, Mild TI, PA peak pressure 54 mm Hg. Normal RV  Pt remains critically ill.  CVL placed 10/28/16. Tmax to 102.7 overnight despite Vanc , Zosyn, and Azithromycin + Tylenol. Blood cultures resent and cooling blanket used.   Remains intubated and sedated. CVP line through CVL not flushing this am.  Nurse troubleshooting.  Afebrile since ~ 0300  Repeat blood Cx pending.   Creatinine 2.0 -> 1.5. K 3.9. Sodium 146  Objective:   Weight Range: 255 lb 1.2 oz (115.7 kg) Body mass index is 41.17 kg/m.   Vital Signs:   Temp:  [98.7 F (37.1 C)-102.7 F (39.3 C)] 99.1 F (37.3 C) (11/10 0600) Pulse Rate:  [90-122] 102 (11/10 0700) Resp:  [23-34] 23 (11/10 0700) BP: (89-130)/(47-83) 127/77 (11/10 0700) SpO2:  [99 %-100 %] 99 % (11/10 0700) FiO2 (%):  [40 %] 40 % (11/10 0700) Weight:  [255 lb 1.2 oz (115.7 kg)] 255 lb 1.2 oz (115.7 kg) (11/10 0400) Last BM Date: 10/26/16  Weight change: Filed Weights   10/27/16 0500 10/28/16 0416 10/29/16 0400  Weight: 251 lb 1.7 oz (113.9 kg) 255 lb 15.3 oz (116.1 kg) 255 lb 1.2 oz (115.7 kg)    Intake/Output:   Intake/Output Summary (Last 24 hours) at 10/29/16 0737 Last data filed at 10/29/16 0700  Gross per 24 hour  Intake          2168.12 ml  Output             6490 ml  Net         -4321.88 ml     Physical Exam: General: Remains intubated sedated.  HEENT: ETT tube in place. ?exopthalmos Neck: supple. JVP appears related to jaw. 2+ bilat; no bruits. No lymphadenopathy noted. Thyroid diffusely enlarged.  Cor: PMI nondisplaced. Tachy, regular. No murmurs appreciated. No S3/S4 Lungs: Diminished throughout, mechanical breath sounds present.  Abdomen: Obese, soft, NT, ND,  no HSM. No bruits or masses. +BS  Extremities: no cyanosis, clubbing, rash. 1-2+ edema to knees.    Neuro: Remains intubated and sedated.   Telemetry: Reviewed, NSR in 90s this am, has been tachy up to 120s   Labs: CBC  Recent Labs  10/26/16 0836  10/28/16 0510 10/29/16 0223  WBC 9.3  < > 13.3* 10.4  NEUTROABS 8.0*  --   --   --   HGB 10.5*  < > 11.4* 10.6*  HCT 33.3*  < > 39.4 35.3*  MCV 70.3*  < > 76.4* 72.2*  PLT 239  < > 296 259  < > = values in this interval not displayed. Basic Metabolic Panel  Recent Labs  10/28/16 0800 10/28/16 1700 10/29/16 0223  NA 138  --  146*  K 4.6  --  3.9  CL 103  --  109  CO2 21*  --  28  GLUCOSE 90  --  116*  BUN 52*  --  51*  CREATININE 2.02*  --  1.50*  CALCIUM 8.2*  --  9.0  MG  --  2.8* 3.0*  PHOS  --  5.2* 5.2*   Liver Function Tests  Recent Labs  10/26/16 0836  AST 29  ALT 18  ALKPHOS 121  BILITOT 0.6  PROT 6.8  ALBUMIN 3.0*   No results for input(s): LIPASE, AMYLASE in the last 72 hours. Cardiac Enzymes  Recent Labs  10/26/16 1105  TROPONINI <0.03    BNP: BNP (last 3 results)  Recent Labs  08/29/16 1018 10/24/16 1403 10/26/16 0624  BNP 15.2 205.8* 227.6*    ProBNP (last 3 results) No results for input(s): PROBNP in the last 8760 hours.   D-Dimer No results for input(s): DDIMER in the last 72 hours. Hemoglobin A1C No results for input(s): HGBA1C in the last 72 hours. Fasting Lipid Panel  Recent Labs  10/27/16 0339  TRIG 98   Thyroid Function Tests No results for input(s): TSH, T4TOTAL, T3FREE, THYROIDAB in the last 72 hours.  Invalid input(s): FREET3  Other results:     Imaging/Studies:  Dg Chest Port 1 View  Result Date: 10/29/2016 CLINICAL DATA:  Endotracheal tube.  Shortness of breath. EXAM: PORTABLE CHEST 1 VIEW COMPARISON:  10/28/2016 FINDINGS: Endotracheal tube remains in place with tip 2.5 cm above the carina. The left jugular venous catheter remains in place with tip  overlying the SVC. Enteric tube courses into the left upper abdomen with tip not imaged. The cardiomediastinal silhouette is unchanged. Perihilar predominant bilateral airspace opacities have minimally improved. No sizable pleural effusion or pneumothorax is identified. IMPRESSION: Slight further improvement of bilateral airspace disease. Electronically Signed   By: Logan Bores M.D.   On: 10/29/2016 07:29   Dg Chest Port 1 View  Result Date: 10/28/2016 CLINICAL DATA:  Hypoxia.  Central catheter placement EXAM: PORTABLE CHEST 1 VIEW COMPARISON:  Study obtained earlier in the day FINDINGS: Central catheter tip is in the superior vena cava. Endotracheal tube tip is 3.1 cm above the carina. Nasogastric tube tip and side port are below the diaphragm. No pneumothorax. There is patchy interstitial and alveolar edema, essentially stable. There is cardiomegaly with pulmonary venous hypertension. No adenopathy evident. IMPRESSION: Tube and catheter positions as described without pneumothorax. Persistent interstitial and patchy alveolar opacity, felt to be consistent with edema/in chest of heart failure. Stable cardiac silhouette. Should be noted that pneumonia superimposed on edema cannot be excluded radiographically. Electronically Signed   By: Lowella Grip III M.D.   On: 10/28/2016 14:21   Dg Chest Port 1 View  Result Date: 10/28/2016 CLINICAL DATA:  Respiratory difficulty EXAM: PORTABLE CHEST 1 VIEW COMPARISON:  10/27/2016 FINDINGS: Endotracheal and NG tubes are again noted. They are stable. Diffuse bilateral airspace disease with a central prep collection is stable. No pneumothorax. The heart remains enlarged. No definite pleural effusion. IMPRESSION: Improving bilateral diffuse airspace disease. Electronically Signed   By: Marybelle Killings M.D.   On: 10/28/2016 07:51    Latest Echo  Latest Cath   Medications:     Scheduled Medications: . azithromycin  500 mg Intravenous Q24H  . chlorhexidine  gluconate (MEDLINE KIT)  15 mL Mouth Rinse BID  . Chlorhexidine Gluconate Cloth  6 each Topical Q0600  . enoxaparin (LOVENOX) injection  40 mg Subcutaneous Q24H  . famotidine (PEPCID) IV  20 mg Intravenous Q12H  . feeding supplement (PRO-STAT SUGAR FREE 64)  60 mL Per Tube QID  . feeding supplement (VITAL HIGH PROTEIN)  1,000 mL Per Tube Q24H  . mouth rinse  15 mL Mouth Rinse 10 times per day  . methimazole  15 mg Oral TID  . mupirocin ointment  1 application Nasal BID  . piperacillin-tazobactam (ZOSYN)  IV  3.375 g Intravenous Q8H  . sodium chloride flush  10-40 mL Intracatheter Q12H  .  vancomycin  750 mg Intravenous Q12H    Infusions: . fentaNYL infusion INTRAVENOUS 250 mcg/hr (10/29/16 0630)  . norepinephrine (LEVOPHED) Adult infusion Stopped (10/28/16 0320)  . propofol (DIPRIVAN) infusion 25 mcg/kg/min (10/28/16 2310)    PRN Medications: acetaminophen, artificial tears, docusate, guaiFENesin, sodium chloride flush   Assessment/Plan   Tara [name] is a 25 y.o. female with history of graves diease, HTN, s/p C-section 07/9380 complicated by post-partum HCAP, pregnancy complicated by pre-eclampsia. Treated for CAP in ED 10/24/16 and sent home.  Returned to ED and admitted 10/25/16 with acute respiratory failure with hypoxia.  Intubated early morning 10/27/16 with pulmonary edema and anxiety.  1. Acute respiratory failure s/p intubation 10/27/16 - Remains on full vent support. Per CCM.  - CVP pending. Line not flushing this am.  - Initial Blood Cx NGTD. Repeat pending, Respiratory culture inconclusive. - WBC trending down.  Fever to 102.7 overnight despite Vanc, Zosyn, and Azithromycin.  2. Acute on chronic diastolic CHF - Echo 12/26/49 LVEF 60-65%, Mild AS, mild LAE, Mild TI, PA peak pressure 54 mm Hg. Normal RV - She appears volume overloaded on exam. CVL placed yesterday but CVP not working this am. Awaiting CVP to make decision on diuresis.  - ? Etiology. Could be do to poorly  controlled hyperthyroidism vs undiagnosed OSA/OHS with body habitus vs poorly controlled HTN  Family not available for history.  3. Graves disease - On methimazole and BB with tachycardia.  - TSH < 0.010, T4 4.77 4. Severe anxiety - Per PCCM/IM 5. ARF  - Creatinine 0.5 -> 2.05 -> 1.5 - Awaiting CVP to make decision on Diuresis.  - renal following.    Length of Stay: 4  Shirley Friar PA-C 10/29/2016, 7:37 AM  Advanced Heart Failure Team Pager [phone] (M-F; 7a - 4p)  Please contact [facility] for night-coverage after hours (4p -7a ) and weekends on [email or web address]   Addendum:  CVP 7-8 this am. Will continue to hold diuretics for now.    Legrand Como [address] [place], PA-C 10/29/2016 8:22 AM   Patient seen with PA, agree with the above note.  At this point, I do not think she is significantly volume overloaded.  CVP 7 range, can hold diuretics today.  She is auto-diuresing as she recovers from renal injury, possibly contrast nephropathy. Continue antibiotics to cover respiratory infection.  I wonder if some of her presentation is not due to thyroid storm => she continues on methimazole.   Loralie Champagne 10/29/2016 11:02 AM

## 2016-10-30 ENCOUNTER — Inpatient Hospital Stay (HOSPITAL_COMMUNITY): Payer: Medicaid Other

## 2016-10-30 DIAGNOSIS — L8990 Pressure ulcer of unspecified site, unspecified stage: Secondary | ICD-10-CM | POA: Insufficient documentation

## 2016-10-30 LAB — GLUCOSE, CAPILLARY
Glucose-Capillary: 127 mg/dL — ABNORMAL HIGH (ref 65–99)
Glucose-Capillary: 125 mg/dL — ABNORMAL HIGH (ref 65–99)
Glucose-Capillary: 113 mg/dL — ABNORMAL HIGH (ref 65–99)
Glucose-Capillary: 118 mg/dL — ABNORMAL HIGH (ref 65–99)
Glucose-Capillary: 133 mg/dL — ABNORMAL HIGH (ref 65–99)

## 2016-10-30 LAB — CBC
HEMATOCRIT: 36.7 % (ref 36.0–46.0)
HEMOGLOBIN: 10.7 g/dL — AB (ref 12.0–15.0)
MCH: 21.4 pg — ABNORMAL LOW (ref 26.0–34.0)
MCHC: 29.2 g/dL — ABNORMAL LOW (ref 30.0–36.0)
MCV: 73.3 fL — ABNORMAL LOW (ref 78.0–100.0)
Platelets: 310 10*3/uL (ref 150–400)
RBC: 5.01 MIL/uL (ref 3.87–5.11)
RDW: 15.4 % (ref 11.5–15.5)
WBC: 11.1 10*3/uL — AB (ref 4.0–10.5)

## 2016-10-30 LAB — BASIC METABOLIC PANEL
ANION GAP: 8 (ref 5–15)
BUN: 38 mg/dL — ABNORMAL HIGH (ref 6–20)
CALCIUM: 9.6 mg/dL (ref 8.9–10.3)
CHLORIDE: 108 mmol/L (ref 101–111)
CO2: 36 mmol/L — AB (ref 22–32)
Creatinine, Ser: 0.85 mg/dL (ref 0.44–1.00)
GFR calc non Af Amer: >60 mL/min (ref >60)
Glucose, Bld: 131 mg/dL — ABNORMAL HIGH (ref 65–99)
POTASSIUM: 3.3 mmol/L — AB (ref 3.5–5.1)
Sodium: 152 mmol/L — ABNORMAL HIGH (ref 135–145)

## 2016-10-30 LAB — POCT I-STAT 3, ART BLOOD GAS (G3+)
ACID-BASE EXCESS: 17 mmol/L — AB (ref 0.0–2.0)
BICARBONATE: 42.9 mmol/L — AB (ref 20.0–28.0)
O2 Saturation: 100 %
PO2 ART: 167 mmHg — AB (ref 83.0–108.0)
Patient temperature: 99.2
TCO2: 45 mmol/L (ref 0–100)
pCO2 arterial: 57.4 mmHg — ABNORMAL HIGH (ref 32.0–48.0)
pH, Arterial: 7.482 — ABNORMAL HIGH (ref 7.350–7.450)

## 2016-10-30 LAB — T4, FREE: FREE T4: 4.81 ng/dL — AB (ref 0.61–1.12)

## 2016-10-30 LAB — MAGNESIUM: Magnesium: 2.8 mg/dL — ABNORMAL HIGH (ref 1.7–2.4)

## 2016-10-30 LAB — CULTURE, BLOOD (ROUTINE X 2)
CULTURE: NO GROWTH
CULTURE: NO GROWTH

## 2016-10-30 LAB — TRIGLYCERIDES: TRIGLYCERIDES: 162 mg/dL — AB (ref <150)

## 2016-10-30 LAB — PHOSPHORUS: PHOSPHORUS: 3.3 mg/dL (ref 2.5–4.6)

## 2016-10-30 MED ORDER — FREE WATER
200.0000 mL | Freq: Four times a day (QID) | Status: DC
  Administered 2016-10-30 – 2016-10-31 (×6): 200 mL

## 2016-10-30 MED ORDER — POTASSIUM CHLORIDE 20 MEQ/15ML (10%) PO SOLN
40.0000 meq | Freq: Once | ORAL | Status: AC
Start: 2016-10-30 — End: 2016-10-30
  Administered 2016-10-30: 40 meq
  Filled 2016-10-30: qty 30

## 2016-10-30 MED ORDER — VITAL HIGH PROTEIN PO LIQD
1000.0000 mL | ORAL | Status: DC
  Administered 2016-10-31: 17:00:00
  Administered 2016-10-31: 1000 mL

## 2016-10-30 MED ORDER — DEXMEDETOMIDINE HCL IN NACL 200 MCG/50ML IV SOLN
0.4000 ug/kg/h | INTRAVENOUS | Status: DC
  Administered 2016-10-30: 0.4 ug/kg/h via INTRAVENOUS
  Administered 2016-10-30: 0.2 ug/kg/h via INTRAVENOUS
  Administered 2016-10-30 (×2): 0.4 ug/kg/h via INTRAVENOUS
  Administered 2016-10-31: 1 ug/kg/h via INTRAVENOUS
  Administered 2016-10-31: 0.4 ug/kg/h via INTRAVENOUS
  Administered 2016-10-31: 0.8 ug/kg/h via INTRAVENOUS
  Administered 2016-10-31 (×2): 0.4 ug/kg/h via INTRAVENOUS
  Filled 2016-10-30 (×10): qty 50

## 2016-10-30 MED ORDER — POTASSIUM CHLORIDE 20 MEQ/15ML (10%) PO SOLN: 40.0000 meq | Freq: Once | ORAL | Status: DC

## 2016-10-30 NOTE — Progress Notes (Signed)
Name: Tara [name] MRN: [id number] DOB: [birth year]    ADMISSION DATE:  10/25/2016 CONSULTATION DATE:  10/25/2016  REFERRING MD :  Dr. 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 Course complicated by contrast induced AKI   SIGNIFICANT EVENTS  07/2016 delivery of healthy baby boy, post partum course complicated by PNA 11/5 ED for SOB, thought to be CAP and discharged with ABX 11/6 back to ED with worsening dyspnea.   STUDIES:  CTA chest 11/6 > negative for PE  SUBJECTIVE:  Auto diuresing  Remains critically ill, intubated & sedated Coughing paroxysms on fent gtt afebrile  VITAL SIGNS: Temp:  [98.2 F (36.8 C)-99.3 F (37.4 C)] 98.8 F (37.1 C) (11/11 0800) Pulse Rate:  [87-133] 106 (11/11 0800) Resp:  [16-35] 30 (11/11 0800) BP: (123-156)/(67-98) 146/74 (11/11 0800) SpO2:  [97 %-100 %] 100 % (11/11 0800) FiO2 (%):  [40 %] 40 % (11/11 0800) Weight:  [240 lb 4.8 oz (109 kg)] 240 lb 4.8 oz (109 kg) (11/11 0345)  PHYSICAL EXAMINATION: General:  Obese female sedated on vent. Neuro:  Sedate, not following commands on fent, when sedation light she is anxious HEENT:  [place]/AT, PERRL, exophthalmus, no obvious JVD Cardiovascular:  Tachy, regular. +2 BLE edema, Lungs:  Coarse crackles and decreased bs  Abdomen:  Soft, non-tender, non-distended Musculoskeletal:  No acute deformity Skin:  Grossly intact  Recent Labs Lab 10/28/16 0800 10/29/16 0223 10/30/16 0355  NA 138 146* 152*  K 4.6 3.9 3.3*  CL 103 109 108  CO2 21* 28 36*  BUN 52* 51* 38*  CREATININE 2.02* 1.50* 0.85  GLUCOSE 90 116* 131*    Recent Labs Lab 10/28/16 0510 10/29/16 0223 10/30/16 0355  HGB 11.4* 10.6* 10.7*  HCT 39.4 35.3* 36.7  WBC 13.3* 10.4 11.1*  PLT 296 259 310   Dg Chest Port 1 View  Result Date: 10/30/2016 CLINICAL DATA:  Evaluate ETT. EXAM: PORTABLE CHEST 1  VIEW COMPARISON:  October 29, 2016 FINDINGS: The ETT is in good position as is a left central line. The NG tube has been pulled back i an n the side port is now at the level of the cervical esophagus. No pneumothorax. Right greater than left pulmonary opacities are stable. No other interval changes. IMPRESSION: 1. The NG tube side port is now at the level of the cervical esophagus, pulled back in the interval. Recommend repositioning. Other support apparatus is stable. 2. Stable pulmonary opacities. These results will be called to the ordering clinician or representative by the Radiologist Assistant, and communication documented in the PACS or zVision Dashboard. Electronically Signed   By: Gerome Samavid  Williams III M.D   On: 10/30/2016 07:26   Dg Chest Port 1 View  Result Date: 10/29/2016 CLINICAL DATA:  Endotracheal tube.  Shortness of breath. EXAM: PORTABLE CHEST 1 VIEW COMPARISON:  10/28/2016 FINDINGS: Endotracheal tube remains in place with tip 2.5 cm above the carina. The left jugular venous catheter remains in place with tip overlying the SVC. Enteric tube courses into the left upper abdomen with tip not imaged. The cardiomediastinal silhouette is unchanged. Perihilar predominant bilateral airspace opacities have minimally improved. No sizable pleural effusion or pneumothorax is identified. IMPRESSION: Slight further improvement of bilateral airspace disease. Electronically Signed   By: Sebastian AcheAllen  Grady M.D.   On: 10/29/2016 07:29   Dg Chest Port 1 View  Result Date:  10/28/2016 CLINICAL DATA:  Hypoxia.  Central catheter placement EXAM: PORTABLE CHEST 1 VIEW COMPARISON:  Study obtained earlier in the day FINDINGS: Central catheter tip is in the superior vena cava. Endotracheal tube tip is 3.1 cm above the carina. Nasogastric tube tip and side port are below the diaphragm. No pneumothorax. There is patchy interstitial and alveolar edema, essentially stable. There is cardiomegaly with pulmonary venous  hypertension. No adenopathy evident. IMPRESSION: Tube and catheter positions as described without pneumothorax. Persistent interstitial and patchy alveolar opacity, felt to be consistent with edema/in chest of heart failure. Stable cardiac silhouette. Should be noted that pneumonia superimposed on edema cannot be excluded radiographically. Electronically Signed   By: Bretta Bang III M.D.   On: 10/28/2016 14:21     STUDIES:  Chest CT with no PE, pulmonary edema  CULTURES: Blood 11/7>>>ng 11/8 sputum>>ng 11/6 RVP neg   ANTIBIOTICS: Vanc 11/7>>>11/8 Zosyn 11/7>>>11/8 Zithromax 11/7>>>  SIGNIFICANT EVENTS: 11/8 intubated 11/8 hypotensive with diuresis   LINES/TUBES: ETT 11/8>>> 11/9 lij cvl>>  DISCUSSION: Diuresing well off lasix, Main barrier to extubation now is coughing fits & agitation  ASSESSMENT / PLAN:  PULMONARY A: Acute pulmonary edema leading to respiratory failure, procalcitonin <0.1, afebrile, unlikely infection, cultures sent. BASDZ P:   - Decrease PEEP to 5 - SBTs with goal extubation  CARDIOVASCULAR A:  Post partum cardiomyopathy, ?myocarditis. Pulmonary HTN- RVSP %$ acutely EF 60-65% P:  - Cardiology consult appreciated - Auto-immune work up for pulmonary HTN in setting of graves dz   RENAL Lab Results  Component Value Date   CREATININE 0.85 10/30/2016   CREATININE 1.50 (H) 10/29/2016   CREATININE 2.02 (H) 10/28/2016   CREATININE 0.40 (L) 02/26/2016    Recent Labs Lab 10/28/16 0800 10/29/16 0223 10/30/16 0355  K 4.6 3.9 3.3*    Renal insuff Hyperkalemia -resolved, now hypokalemic  P:  Replete lytes -   GASTROINTESTINAL A:   No active issues P:   - TF per nutrition.  HEMATOLOGIC A:   Leukocytosis, likely a stress response P:  - CBC in AM  INFECTIOUS A:   Doubt PNA but c x r with ards type pattern P:   - cultures ngtd - On zithro, zosyn. Dc vanc  ENDOCRINE A:   Graves dz P:   - Continue tapazole - Check  T3/T4  NEUROLOGIC A:   Severe anxiety P:   RASS goal: 0 to -1 - Fentanyl drip. -add precedex gtt   FAMILY  - Updates: No family bedside  - Inter-disciplinary family meet or Palliative Care meeting due by:     The patient is critically ill with multiple organ systems failure and requires high complexity decision making for assessment and support, frequent evaluation and titration of therapies, application of advanced monitoring technologies and extensive interpretation of multiple databases. Critical Care Time devoted to patient care services described in this note independent of APP time is 35 minutes.    Cyril Mourning MD. Tonny Bollman. [place] [facility] Pager [phone] If no response call [phone]   10/30/2016   10/30/2016, 8:40 AM

## 2016-10-30 NOTE — Progress Notes (Signed)
Received call from radiology regarding patients OG tube, advanced OG tube and abdominal xray ordered to confirm placement. MD Vassie Loll reviewed Abdominal xray during rounds and advised OG tube okay to resume use.

## 2016-10-30 NOTE — Progress Notes (Signed)
Patient ID: Tara [name], female   DOB: November 03, 1991, 25 y.o.   MRN: [id number]     Advanced Heart Failure Rounding Note  Referring Physician: Dr. Corrie Dandy Primary Physician: None per patient.  Primary Cardiologist:  None  Reason for Consultation: ? Post-partum cardiomyopathy vs Diastolic HF  Subjective:    Echo 10/26/16 LVEF 60-65%, Mild AS, mild LAE, Mild TI, PA peak pressure 54 mm Hg. Normal RV  Pt remains critically ill.  CVL placed 10/28/16.  CVP 10-11 today.  She is auto-diuresing impressively.  No Lasix yesterday but > 4000 cc UOP.  She is afebrile today. Remains on vancomycin/Zosyn.  Remains intubated with CXR showing bilateral lung infiltrates.  Coughed profusely with attempts at vent weaning yesterday.   Creatinine improved.   Objective:   Weight Range: 240 lb 4.8 oz (109 kg) Body mass index is 38.79 kg/m.   Vital Signs:   Temp:  [98.2 F (36.8 C)-99.3 F (37.4 C)] 98.9 F (37.2 C) (11/11 0500) Pulse Rate:  [87-133] 101 (11/11 0700) Resp:  [16-35] 30 (11/11 0700) BP: (123-156)/(67-98) 138/73 (11/11 0700) SpO2:  [97 %-100 %] 100 % (11/11 0735) FiO2 (%):  [40 %] 40 % (11/11 0735) Weight:  [240 lb 4.8 oz (109 kg)] 240 lb 4.8 oz (109 kg) (11/11 0345) Last BM Date: 10/29/16  Weight change: Filed Weights   10/28/16 0416 10/29/16 0400 10/30/16 0345  Weight: 255 lb 15.3 oz (116.1 kg) 255 lb 1.2 oz (115.7 kg) 240 lb 4.8 oz (109 kg)    Intake/Output:   Intake/Output Summary (Last 24 hours) at 10/30/16 0757 Last data filed at 10/30/16 0600  Gross per 24 hour  Intake          2298.58 ml  Output             4835 ml  Net         -2536.42 ml     Physical Exam: General: Remains intubated sedated.  HEENT: ETT tube in place. ?exopthalmos Neck: supple. JVP 8 cm. No lymphadenopathy noted. Thyroid diffusely enlarged.  Cor: PMI nondisplaced. Mildly tachy, regular. No murmurs appreciated. No S3/S4 Lungs: Diminished throughout, mechanical breath sounds present.    Abdomen: Obese, soft, NT, ND, no HSM. No bruits or masses. +BS  Extremities: no cyanosis, clubbing, rash. 1-2+ edema to knees.    Neuro: Remains intubated and sedated.   Telemetry: Reviewed, NSR in 100s  Labs: CBC  Recent Labs  10/29/16 0223 10/30/16 0355  WBC 10.4 11.1*  HGB 10.6* 10.7*  HCT 35.3* 36.7  MCV 72.2* 73.3*  PLT 259 573   Basic Metabolic Panel  Recent Labs  10/29/16 0223 10/30/16 0355  NA 146* 152*  K 3.9 3.3*  CL 109 108  CO2 28 36*  GLUCOSE 116* 131*  BUN 51* 38*  CREATININE 1.50* 0.85  CALCIUM 9.0 9.6  MG 3.0* 2.8*  PHOS 5.2* 3.3   Liver Function Tests No results for input(s): AST, ALT, ALKPHOS, BILITOT, PROT, ALBUMIN in the last 72 hours. No results for input(s): LIPASE, AMYLASE in the last 72 hours. Cardiac Enzymes No results for input(s): CKTOTAL, CKMB, CKMBINDEX, TROPONINI in the last 72 hours.  BNP: BNP (last 3 results)  Recent Labs  08/29/16 1018 10/24/16 1403 10/26/16 0624  BNP 15.2 205.8* 227.6*    ProBNP (last 3 results) No results for input(s): PROBNP in the last 8760 hours.   D-Dimer No results for input(s): DDIMER in the last 72 hours. Hemoglobin A1C No results for input(s):  HGBA1C in the last 72 hours. Fasting Lipid Panel  Recent Labs  10/30/16 0355  TRIG 162*   Thyroid Function Tests  Recent Labs  10/29/16 1000  TSH <0.010*    Other results:     Imaging/Studies:  Dg Chest Port 1 View  Result Date: 10/30/2016 CLINICAL DATA:  Evaluate ETT. EXAM: PORTABLE CHEST 1 VIEW COMPARISON:  October 29, 2016 FINDINGS: The ETT is in good position as is a left central line. The NG tube has been pulled back i an n the side port is now at the level of the cervical esophagus. No pneumothorax. Right greater than left pulmonary opacities are stable. No other interval changes. IMPRESSION: 1. The NG tube side port is now at the level of the cervical esophagus, pulled back in the interval. Recommend repositioning. Other  support apparatus is stable. 2. Stable pulmonary opacities. These results will be called to the ordering clinician or representative by the Radiologist Assistant, and communication documented in the PACS or zVision Dashboard. Electronically Signed   By: Dorise Bullion III M.D   On: 10/30/2016 07:26   Dg Chest Port 1 View  Result Date: 10/29/2016 CLINICAL DATA:  Endotracheal tube.  Shortness of breath. EXAM: PORTABLE CHEST 1 VIEW COMPARISON:  10/28/2016 FINDINGS: Endotracheal tube remains in place with tip 2.5 cm above the carina. The left jugular venous catheter remains in place with tip overlying the SVC. Enteric tube courses into the left upper abdomen with tip not imaged. The cardiomediastinal silhouette is unchanged. Perihilar predominant bilateral airspace opacities have minimally improved. No sizable pleural effusion or pneumothorax is identified. IMPRESSION: Slight further improvement of bilateral airspace disease. Electronically Signed   By: Logan Bores M.D.   On: 10/29/2016 07:29   Dg Chest Port 1 View  Result Date: 10/28/2016 CLINICAL DATA:  Hypoxia.  Central catheter placement EXAM: PORTABLE CHEST 1 VIEW COMPARISON:  Study obtained earlier in the day FINDINGS: Central catheter tip is in the superior vena cava. Endotracheal tube tip is 3.1 cm above the carina. Nasogastric tube tip and side port are below the diaphragm. No pneumothorax. There is patchy interstitial and alveolar edema, essentially stable. There is cardiomegaly with pulmonary venous hypertension. No adenopathy evident. IMPRESSION: Tube and catheter positions as described without pneumothorax. Persistent interstitial and patchy alveolar opacity, felt to be consistent with edema/in chest of heart failure. Stable cardiac silhouette. Should be noted that pneumonia superimposed on edema cannot be excluded radiographically. Electronically Signed   By: Lowella Grip III M.D.   On: 10/28/2016 14:21    Latest Echo  Latest  Cath   Medications:     Scheduled Medications: . chlorhexidine gluconate (MEDLINE KIT)  15 mL Mouth Rinse BID  . enoxaparin (LOVENOX) injection  40 mg Subcutaneous Q24H  . famotidine  20 mg Per Tube BID  . feeding supplement (PRO-STAT SUGAR FREE 64)  60 mL Per Tube QID  . feeding supplement (VITAL HIGH PROTEIN)  1,000 mL Per Tube Q24H  . mouth rinse  15 mL Mouth Rinse 10 times per day  . methimazole  15 mg Oral TID  . mupirocin ointment  1 application Nasal BID  . piperacillin-tazobactam (ZOSYN)  IV  3.375 g Intravenous Q8H  . potassium chloride  40 mEq Oral Once  . sodium chloride flush  10-40 mL Intracatheter Q12H  . vancomycin  750 mg Intravenous Q12H    Infusions: . fentaNYL infusion INTRAVENOUS 300 mcg/hr (10/30/16 0130)  . norepinephrine (LEVOPHED) Adult infusion Stopped (10/28/16 0320)  PRN Medications: acetaminophen, artificial tears, docusate, guaiFENesin, midazolam, sodium chloride flush   Assessment/Plan   Tara [name] is a 25 y.o. female with history of graves diease, HTN, s/p C-section 04/5000 complicated by post-partum HCAP, pregnancy complicated by pre-eclampsia. Treated for CAP in ED 10/24/16 and sent home.  Returned to ED and admitted 10/25/16 with acute respiratory failure with hypoxia.  Intubated early morning 10/27/16 with pulmonary edema and anxiety.  1. Acute respiratory failure s/p intubation 10/27/16: Suspect PNA with likely pulmonary edema component (?ARDS).  CVP 10-11 this morning.  Blood cultures NGTD.  Afebrile today.  - Good diuresis without Lasix yesterday, continue to allow auto-diuresis today as long as it continues.  - Remains on full vent support. Per CCM. - On vancomycin/Zosyn.  2. Acute on chronic diastolic CHF: Echo 64/2/90 LVEF 60-65%, Mild AS, mild LAE, Mild TI, PA peak pressure 54 mmHg, normal RV.  ? Etiology. Could be do to poorly controlled hyperthyroidism vs undiagnosed OSA/OHS with body habitus vs poorly controlled HTN  Family not  available for history. Volume improving, weight down. Auto-diuresing without Lasix.  CVP 10-11 today.  - Can hold off on Lasix for now given good UOP. 3. Graves disease: Methimazole, eventually will need definitive treatment.  4. Severe anxiety 5. ARF: Resolved. Renal following.    Length of Stay: [place]  10/30/2016, 7:57 AM  Advanced Heart Failure Team Pager [phone] (M-F; 7a - 4p)  Please contact [facility] for night-coverage after hours (4p -7a ) and weekends on [email or web address]

## 2016-10-30 NOTE — Progress Notes (Signed)
Attempted to wean patient again on PSV 10/5 40% RR increased to 40 BPM, TVe decreased to 163 ml and patient became agitated, placed back on PRVC on previous settings.

## 2016-10-31 ENCOUNTER — Inpatient Hospital Stay (HOSPITAL_COMMUNITY): Payer: Medicaid Other

## 2016-10-31 LAB — CULTURE, BLOOD (ROUTINE X 2)
Culture: NO GROWTH
Culture: NO GROWTH

## 2016-10-31 LAB — BASIC METABOLIC PANEL
ANION GAP: 9 (ref 5–15)
BUN: 40 mg/dL — AB (ref 6–20)
CHLORIDE: 111 mmol/L (ref 101–111)
CO2: 35 mmol/L — AB (ref 22–32)
Calcium: 9.5 mg/dL (ref 8.9–10.3)
Creatinine, Ser: 0.92 mg/dL (ref 0.44–1.00)
GFR calc Af Amer: >60 mL/min (ref >60)
GFR calc non Af Amer: >60 mL/min (ref >60)
GLUCOSE: 127 mg/dL — AB (ref 65–99)
POTASSIUM: 3.7 mmol/L (ref 3.5–5.1)
Sodium: 155 mmol/L — ABNORMAL HIGH (ref 135–145)

## 2016-10-31 LAB — CBC
HCT: 37.1 % (ref 36.0–46.0)
Hemoglobin: 11 g/dL — ABNORMAL LOW (ref 12.0–15.0)
MCH: 21.9 pg — ABNORMAL LOW (ref 26.0–34.0)
MCHC: 29.6 g/dL — ABNORMAL LOW (ref 30.0–36.0)
MCV: 73.8 fL — ABNORMAL LOW (ref 78.0–100.0)
Platelets: 286 10*3/uL (ref 150–400)
RBC: 5.03 MIL/uL (ref 3.87–5.11)
RDW: 15.5 % (ref 11.5–15.5)
WBC: 7.4 10*3/uL (ref 4.0–10.5)

## 2016-10-31 LAB — GLUCOSE, CAPILLARY
GLUCOSE-CAPILLARY: 107 mg/dL — AB (ref 65–99)
GLUCOSE-CAPILLARY: 126 mg/dL — AB (ref 65–99)
Glucose-Capillary: 118 mg/dL — ABNORMAL HIGH (ref 65–99)
Glucose-Capillary: 119 mg/dL — ABNORMAL HIGH (ref 65–99)
Glucose-Capillary: 143 mg/dL — ABNORMAL HIGH (ref 65–99)

## 2016-10-31 LAB — POCT I-STAT 3, ART BLOOD GAS (G3+)
Acid-Base Excess: 11 mmol/L — ABNORMAL HIGH (ref 0.0–2.0)
BICARBONATE: 35.4 mmol/L — AB (ref 20.0–28.0)
O2 Saturation: 98 %
PH ART: 7.473 — AB (ref 7.350–7.450)
PO2 ART: 108 mmHg (ref 83.0–108.0)
Patient temperature: 99.5
TCO2: 37 mmol/L (ref 0–100)
pCO2 arterial: 48.4 mmHg — ABNORMAL HIGH (ref 32.0–48.0)

## 2016-10-31 LAB — T3: T3, Total: 339 ng/dL — ABNORMAL HIGH (ref 71–180)

## 2016-10-31 LAB — PHOSPHORUS: Phosphorus: 4.9 mg/dL — ABNORMAL HIGH (ref 2.5–4.6)

## 2016-10-31 LAB — MAGNESIUM: Magnesium: 2.6 mg/dL — ABNORMAL HIGH (ref 1.7–2.4)

## 2016-10-31 MED ORDER — DEXTROSE 5 % IV SOLN
INTRAVENOUS | Status: DC
  Administered 2016-10-31: 10:00:00 via INTRAVENOUS

## 2016-10-31 NOTE — Progress Notes (Signed)
Changed vent mode to PSV 15/5 and 30%, in order to start weaning, patient tolerated well, will continue to monitor patient.

## 2016-10-31 NOTE — Progress Notes (Signed)
Patient extubated per MD's order, Placed on 3LNC, no stridor heard at the neck, patient is able to vocalize, SATS 98%, HR 120, RR 28 BPM, will continue to monitor patient.

## 2016-10-31 NOTE — Progress Notes (Signed)
eLink Physician-Brief Progress Note Patient Name: Tara [name] DOB: 1991-08-11 MRN: [id number]   Date of Service  10/31/2016  HPI/Events of Note  Patient doing well with SBT.   ABG on cpap 5/PS 5 with 30% FIO2 for more than an hour 7.48/47/105/35  eICU Interventions  extubate     Intervention Category Major Interventions: Respiratory failure - evaluation and management  Henry Russel, P 10/31/2016, 5:09 PM

## 2016-10-31 NOTE — Progress Notes (Addendum)
Patient ID: Tara [name], female   DOB: [birth year], 25 y.o.   MRN: [id number]     Advanced Heart Failure Rounding Note  Referring Physician: Dr. Corrie Dandy Primary Physician: None per patient.  Primary Cardiologist:  None  Reason for Consultation: ? Post-partum cardiomyopathy vs Diastolic HF  Subjective:    Echo 10/26/16 LVEF 60-65%, Mild AS, mild LAE, Mild TI, PA peak pressure 54 mm Hg. Normal RV  Pt remains critically ill.  CVL placed 10/28/16.  She continues to auto-diurese, net negative 1072 cc yesterday. Unable to get CVP off line this morning.   She is afebrile today. She completed 7 days of vancomycin/Zosyn, now antibiotics stopped.  Remains intubated with CXR showing bilateral lung infiltrates.  Still a lot of coughing with vent weaning.   Creatinine stable.    Objective:   Weight Range: 241 lb 6.5 oz (109.5 kg) Body mass index is 38.96 kg/m.   Vital Signs:   Temp:  [98.2 F (36.8 C)-99 F (37.2 C)] 98.7 F (37.1 C) (11/12 0400) Pulse Rate:  [71-109] 76 (11/12 0700) Resp:  [20-33] 28 (11/12 0700) BP: (124-153)/(68-106) 138/74 (11/12 0700) SpO2:  [99 %-100 %] 100 % (11/12 0700) FiO2 (%):  [40 %] 40 % (11/12 0400) Weight:  [241 lb 6.5 oz (109.5 kg)] 241 lb 6.5 oz (109.5 kg) (11/12 0455) Last BM Date: 10/30/16  Weight change: Filed Weights   10/29/16 0400 10/30/16 0345 10/31/16 0455  Weight: 255 lb 1.2 oz (115.7 kg) 240 lb 4.8 oz (109 kg) 241 lb 6.5 oz (109.5 kg)    Intake/Output:   Intake/Output Summary (Last 24 hours) at 10/31/16 0800 Last data filed at 10/31/16 0700  Gross per 24 hour  Intake          2072.65 ml  Output             2940 ml  Net          -867.35 ml     Physical Exam: General: Remains intubated sedated.  HEENT: ETT tube in place. ?exopthalmos Neck: supple. JVP 8 cm. No lymphadenopathy noted. Thyroid diffusely enlarged.  Cor: PMI nondisplaced. Mildly tachy, regular. No murmurs appreciated. No S3/S4 Lungs: Diminished throughout,  mechanical breath sounds present.  Abdomen: Obese, soft, NT, ND, no HSM. No bruits or masses. +BS  Extremities: no cyanosis, clubbing, rash. 1-2+ edema to knees.    Neuro: Remains intubated and sedated.   Telemetry: Reviewed, NSR in 100s  Labs: CBC  Recent Labs  10/30/16 0355 10/31/16 0300  WBC 11.1* 7.4  HGB 10.7* 11.0*  HCT 36.7 37.1  MCV 73.3* 73.8*  PLT 310 416   Basic Metabolic Panel  Recent Labs  10/30/16 0355 10/31/16 0300  NA 152* 155*  K 3.3* 3.7  CL 108 111  CO2 36* 35*  GLUCOSE 131* 127*  BUN 38* 40*  CREATININE 0.85 0.92  CALCIUM 9.6 9.5  MG 2.8* 2.6*  PHOS 3.3 4.9*   Liver Function Tests No results for input(s): AST, ALT, ALKPHOS, BILITOT, PROT, ALBUMIN in the last 72 hours. No results for input(s): LIPASE, AMYLASE in the last 72 hours. Cardiac Enzymes No results for input(s): CKTOTAL, CKMB, CKMBINDEX, TROPONINI in the last 72 hours.  BNP: BNP (last 3 results)  Recent Labs  08/29/16 1018 10/24/16 1403 10/26/16 0624  BNP 15.2 205.8* 227.6*    ProBNP (last 3 results) No results for input(s): PROBNP in the last 8760 hours.   D-Dimer No results for input(s): DDIMER in the last  72 hours. Hemoglobin A1C No results for input(s): HGBA1C in the last 72 hours. Fasting Lipid Panel  Recent Labs  10/30/16 0355  TRIG 162*   Thyroid Function Tests  Recent Labs  10/29/16 1000  TSH <0.010*    Other results:     Imaging/Studies:  Dg Chest Port 1 View  Result Date: 10/31/2016 CLINICAL DATA:  Acute hypoxemic respiratory failure EXAM: PORTABLE CHEST 1 VIEW COMPARISON:  10/30/2016 FINDINGS: Endotracheal tube tip is 2 cm above the carina. Nasogastric tube enters the stomach left internal jugular central line has its tip in the SVC just above the right atrium. Bilateral airspace filling has worsened, most likely worsen pneumonia. Some of this may relate to ventilatory timing. I think there is worsened volume loss in the left lower lobe  IMPRESSION: Lines and tubes well positioned. Worsened pneumonia and left lower lobe volume loss. Electronically Signed   By: Nelson Chimes M.D.   On: 10/31/2016 06:39   Dg Chest Port 1 View  Result Date: 10/30/2016 CLINICAL DATA:  Evaluate ETT. EXAM: PORTABLE CHEST 1 VIEW COMPARISON:  October 29, 2016 FINDINGS: The ETT is in good position as is a left central line. The NG tube has been pulled back i an n the side port is now at the level of the cervical esophagus. No pneumothorax. Right greater than left pulmonary opacities are stable. No other interval changes. IMPRESSION: 1. The NG tube side port is now at the level of the cervical esophagus, pulled back in the interval. Recommend repositioning. Other support apparatus is stable. 2. Stable pulmonary opacities. These results will be called to the ordering clinician or representative by the Radiologist Assistant, and communication documented in the PACS or zVision Dashboard. Electronically Signed   By: Dorise Bullion III M.D   On: 10/30/2016 07:26   Dg Abd Portable 1v  Result Date: 10/30/2016 CLINICAL DATA:  Cardiomyopathy, pulmonary edema and respiratory failure. Status post nasogastric tube placement. EXAM: PORTABLE ABDOMEN - 1 VIEW COMPARISON:  10/27/2016 FINDINGS: Gastric decompression tube visualized with the tip in the distal body of the stomach. Bowel gas pattern is normal without evidence of obstruction or ileus. No abnormal calcifications identified. Bony and soft tissue structures are unremarkable. IMPRESSION: Gastric decompression tube extends into the body of the stomach. Unremarkable bowel gas pattern. Electronically Signed   By: Aletta Edouard M.D.   On: 10/30/2016 09:17    Latest Echo  Latest Cath   Medications:     Scheduled Medications: . chlorhexidine gluconate (MEDLINE KIT)  15 mL Mouth Rinse BID  . enoxaparin (LOVENOX) injection  40 mg Subcutaneous Q24H  . famotidine  20 mg Per Tube BID  . feeding supplement (PRO-STAT  SUGAR FREE 64)  60 mL Per Tube QID  . feeding supplement (VITAL HIGH PROTEIN)  1,000 mL Per Tube Q24H  . free water  200 mL Per Tube Q6H  . mouth rinse  15 mL Mouth Rinse 10 times per day  . methimazole  15 mg Oral TID  . piperacillin-tazobactam (ZOSYN)  IV  3.375 g Intravenous Q8H  . sodium chloride flush  10-40 mL Intracatheter Q12H    Infusions: . dexmedetomidine 0.2 mcg/kg/hr (10/31/16 0754)  . fentaNYL infusion INTRAVENOUS Stopped (10/31/16 0754)    PRN Medications: acetaminophen, artificial tears, docusate, guaiFENesin, midazolam, sodium chloride flush   Assessment/Plan   KENYA KOOK is a 25 y.o. female with history of graves diease, HTN, s/p C-section 03/1961 complicated by post-partum HCAP, pregnancy complicated by pre-eclampsia. Treated for  CAP in ED 10/24/16 and sent home.  Returned to ED and admitted 10/25/16 with acute respiratory failure with hypoxia.  Intubated early morning 10/27/16 with pulmonary edema and anxiety.  1. Acute respiratory failure s/p intubation 10/27/16: Suspect PNA with likely pulmonary edema component (?ARDS).  Unable to get CVP off line today.  Blood cultures NGTD.  Afebrile today.  - Good diuresis without Lasix again yesterday, continue to allow auto-diuresis today as long as it continues.  - Remains on full vent support. Weaning today per CCM. - Completed course of vancomycin/Zosyn.  2. Acute on chronic diastolic CHF: Echo 08/0/22 LVEF 60-65%, Mild AS, mild LAE, Mild TI, PA peak pressure 54 mmHg, normal RV.  ? Etiology. Could be do to poorly controlled hyperthyroidism vs undiagnosed OSA/OHS with body habitus vs poorly controlled HTN  Family not available for history. Volume improving, weight has trended down. Auto-diuresing without Lasix.  Unable to get CVP off line this morning.  - Can hold off on Lasix for now given good UOP. 3. Graves disease: Methimazole, eventually will need definitive treatment.  4. Severe anxiety 5. ARF: Resolved. Renal  following.  6. Hypernatremia: Will need free water boluses, per CCM.    Length of Stay: [place]  10/31/2016, 8:00 AM  Advanced Heart Failure Team Pager [phone] (M-F; 7a - 4p)  Please contact [facility] for night-coverage after hours (4p -7a ) and weekends on [email or web address]

## 2016-10-31 NOTE — Progress Notes (Signed)
240 mL of fentanyl wasted, witnessed by Hoyle Sauer RN

## 2016-10-31 NOTE — Progress Notes (Signed)
Name: Tara [name] MRN: [id number] DOB: 1991-12-18    ADMISSION DATE:  10/25/2016 CONSULTATION DATE:  10/25/2016  REFERRING MD :  Dr. 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 Course complicated by contrast induced AKI   SIGNIFICANT EVENTS  07/2016 delivery of healthy baby boy, post partum course complicated by PNA 11/5 ED for SOB, thought to be CAP and discharged with ABX 11/6 back to ED with worsening dyspnea.   STUDIES:  CTA chest 11/6 > negative for PE  SUBJECTIVE:  Remains critically ill, intubated & sedated Mild agitation when drips weaned Coughing paroxysms on fent gtt -better afebrile  VITAL SIGNS: Temp:  [98.2 F (36.8 C)-99 F (37.2 C)] 98.7 F (37.1 C) (11/12 0800) Pulse Rate:  [71-95] 92 (11/12 0800) Resp:  [20-33] 28 (11/12 0800) BP: (124-153)/(68-106) 143/85 (11/12 0800) SpO2:  [99 %-100 %] 100 % (11/12 0830) FiO2 (%):  [30 %-40 %] 30 % (11/12 0830) Weight:  [241 lb 6.5 oz (109.5 kg)] 241 lb 6.5 oz (109.5 kg) (11/12 0455)  PHYSICAL EXAMINATION: General:  Obese female sedated on vent. Neuro:  Sedate, not following commands on fent, when sedation light she is anxious HEENT:  [place]/AT, PERRL, exophthalmus,rt eye edema, red, no obvious JVD Cardiovascular:  Tachy, regular. +1 BLE edema, Lungs:  Coarse crackles and decreased bs  Abdomen:  Soft, non-tender, non-distended Musculoskeletal:  No acute deformity Skin:  Grossly intact  Recent Labs Lab 10/29/16 0223 10/30/16 0355 10/31/16 0300  NA 146* 152* 155*  K 3.9 3.3* 3.7  CL 109 108 111  CO2 28 36* 35*  BUN 51* 38* 40*  CREATININE 1.50* 0.85 0.92  GLUCOSE 116* 131* 127*    Recent Labs Lab 10/29/16 0223 10/30/16 0355 10/31/16 0300  HGB 10.6* 10.7* 11.0*  HCT 35.3* 36.7 37.1  WBC 10.4 11.1* 7.4  PLT 259 310 286   Dg Chest Port 1 View  Result Date:  10/31/2016 CLINICAL DATA:  Acute hypoxemic respiratory failure EXAM: PORTABLE CHEST 1 VIEW COMPARISON:  10/30/2016 FINDINGS: Endotracheal tube tip is 2 cm above the carina. Nasogastric tube enters the stomach left internal jugular central line has its tip in the SVC just above the right atrium. Bilateral airspace filling has worsened, most likely worsen pneumonia. Some of this may relate to ventilatory timing. I think there is worsened volume loss in the left lower lobe IMPRESSION: Lines and tubes well positioned. Worsened pneumonia and left lower lobe volume loss. Electronically Signed   By: Paulina FusiMark  Shogry M.D.   On: 10/31/2016 06:39   Dg Chest Port 1 View  Result Date: 10/30/2016 CLINICAL DATA:  Evaluate ETT. EXAM: PORTABLE CHEST 1 VIEW COMPARISON:  October 29, 2016 FINDINGS: The ETT is in good position as is a left central line. The NG tube has been pulled back i an n the side port is now at the level of the cervical esophagus. No pneumothorax. Right greater than left pulmonary opacities are stable. No other interval changes. IMPRESSION: 1. The NG tube side port is now at the level of the cervical esophagus, pulled back in the interval. Recommend repositioning. Other support apparatus is stable. 2. Stable pulmonary opacities. These results will be called to the ordering clinician or representative by the Radiologist Assistant, and communication documented in the PACS or zVision Dashboard. Electronically Signed   By: Gerome Samavid  Williams III M.D   On:  10/30/2016 07:26   Dg Abd Portable 1v  Result Date: 10/30/2016 CLINICAL DATA:  Cardiomyopathy, pulmonary edema and respiratory failure. Status post nasogastric tube placement. EXAM: PORTABLE ABDOMEN - 1 VIEW COMPARISON:  10/27/2016 FINDINGS: Gastric decompression tube visualized with the tip in the distal body of the stomach. Bowel gas pattern is normal without evidence of obstruction or ileus. No abnormal calcifications identified. Bony and soft tissue  structures are unremarkable. IMPRESSION: Gastric decompression tube extends into the body of the stomach. Unremarkable bowel gas pattern. Electronically Signed   By: Irish Lack M.D.   On: 10/30/2016 09:17     STUDIES:  Chest CT with no PE, pulmonary edema  CULTURES: Blood 11/7>>>ng 11/8 sputum>>ng 11/6 RVP neg   ANTIBIOTICS: Vanc 11/7>>>11/8 Zosyn 11/7>>> Zithromax 11/7>>>11/12  SIGNIFICANT EVENTS: 11/8 intubated 11/8 hypotensive with diuresis   LINES/TUBES: ETT 11/8>>> 11/9 lij cvl>>  DISCUSSION: Diuresing well off lasix, Main barrier to extubation now is coughing fits & agitation  ASSESSMENT / PLAN:  PULMONARY A: Acute pulmonary edema leading to respiratory failure, procalcitonin <0.1, afebrile, unlikely infection, cultures sent. BASDZ P:   - SBTs with goal extubation, tolerates 15/5  CARDIOVASCULAR A:  Post partum cardiomyopathy, ?myocarditis. Pulmonary HTN- RVSP %$ acutely EF 60-65% P:  - Cardiology consult appreciated - Auto-immune work neg   RENAL Lab Results  Component Value Date   CREATININE 0.92 10/31/2016   CREATININE 0.85 10/30/2016   CREATININE 1.50 (H) 10/29/2016   CREATININE 0.40 (L) 02/26/2016    Recent Labs Lab 10/29/16 0223 10/30/16 0355 10/31/16 0300  K 3.9 3.3* 3.7    Renal insuff Hyperkalemia -resolved Hypernatremia  P:  Replete lytes - add free water, d5W @ 50/h  GASTROINTESTINAL A:   No active issues P:   - TF per nutrition.  HEMATOLOGIC A:   Leukocytosis, likely a stress response P:  - resolved  INFECTIOUS A:   Doubt PNA but c x r with ards type pattern P:   - cultures ngtd - Onzosyn. Dc vanc, azithro  ENDOCRINE A:   Graves dz Free T4 high P:   - Continue tapazole, may need endocrine input here - T3 pending  NEUROLOGIC A:   Severe anxiety P:   RASS goal: 0 to -1 - Fentanyl drip. -ct  precedex gtt   FAMILY  - Updates: No family bedside  - Inter-disciplinary family meet or  Palliative Care meeting due by: NA   The patient is critically ill with multiple organ systems failure and requires high complexity decision making for assessment and support, frequent evaluation and titration of therapies, application of advanced monitoring technologies and extensive interpretation of multiple databases. Critical Care Time devoted to patient care services described in this note independent of APP time is 35 minutes.    Cyril Mourning MD. Tonny Bollman. [facility] Pager [phone] If no response call [phone]    10/31/2016, 9:06 AM

## 2016-11-01 ENCOUNTER — Inpatient Hospital Stay (HOSPITAL_COMMUNITY): Payer: Medicaid Other

## 2016-11-01 LAB — BASIC METABOLIC PANEL
ANION GAP: 10 (ref 5–15)
Anion gap: 10 (ref 5–15)
BUN: 29 mg/dL — ABNORMAL HIGH (ref 6–20)
BUN: 20 mg/dL (ref 6–20)
CALCIUM: 9.1 mg/dL (ref 8.9–10.3)
CALCIUM: 8.9 mg/dL (ref 8.9–10.3)
CO2: 30 mmol/L (ref 22–32)
CO2: 28 mmol/L (ref 22–32)
CREATININE: 0.85 mg/dL (ref 0.44–1.00)
Chloride: 113 mmol/L — ABNORMAL HIGH (ref 101–111)
Chloride: 105 mmol/L (ref 101–111)
Creatinine, Ser: 0.82 mg/dL (ref 0.44–1.00)
GFR calc non Af Amer: >60 mL/min (ref >60)
GLUCOSE: 115 mg/dL — AB (ref 65–99)
Glucose, Bld: 118 mg/dL — ABNORMAL HIGH (ref 65–99)
Potassium: 2.8 mmol/L — ABNORMAL LOW (ref 3.5–5.1)
Potassium: 3.1 mmol/L — ABNORMAL LOW (ref 3.5–5.1)
SODIUM: 153 mmol/L — AB (ref 135–145)
Sodium: 143 mmol/L (ref 135–145)

## 2016-11-01 LAB — CBC
HCT: 35.8 % — ABNORMAL LOW (ref 36.0–46.0)
Hemoglobin: 10.7 g/dL — ABNORMAL LOW (ref 12.0–15.0)
MCH: 21.6 pg — AB (ref 26.0–34.0)
MCHC: 29.9 g/dL — ABNORMAL LOW (ref 30.0–36.0)
MCV: 72.2 fL — ABNORMAL LOW (ref 78.0–100.0)
PLATELETS: 307 10*3/uL (ref 150–400)
RBC: 4.96 MIL/uL (ref 3.87–5.11)
RDW: 15.2 % (ref 11.5–15.5)
WBC: 9.5 10*3/uL (ref 4.0–10.5)

## 2016-11-01 LAB — GLUCOSE, CAPILLARY
GLUCOSE-CAPILLARY: 110 mg/dL — AB (ref 65–99)
GLUCOSE-CAPILLARY: 205 mg/dL — AB (ref 65–99)
GLUCOSE-CAPILLARY: 93 mg/dL (ref 65–99)
Glucose-Capillary: 111 mg/dL — ABNORMAL HIGH (ref 65–99)

## 2016-11-01 LAB — C DIFFICILE QUICK SCREEN W PCR REFLEX
C DIFFICLE (CDIFF) ANTIGEN: NEGATIVE
C Diff interpretation: NOT DETECTED
C Diff toxin: NEGATIVE

## 2016-11-01 MED ORDER — POTASSIUM CHLORIDE CRYS ER 20 MEQ PO TBCR
40.0000 meq | EXTENDED_RELEASE_TABLET | Freq: Once | ORAL | Status: AC
  Administered 2016-11-02: 40 meq via ORAL
  Filled 2016-11-01: qty 2

## 2016-11-01 MED ORDER — POTASSIUM CHLORIDE CRYS ER 20 MEQ PO TBCR: 40.0000 meq | EXTENDED_RELEASE_TABLET | ORAL | Status: DC

## 2016-11-01 MED ORDER — FAMOTIDINE 40 MG/5ML PO SUSR
20.0000 mg | Freq: Two times a day (BID) | ORAL | Status: DC
  Administered 2016-11-01: 20 mg via ORAL
  Filled 2016-11-01: qty 2.5

## 2016-11-01 MED ORDER — POTASSIUM CHLORIDE 10 MEQ/50ML IV SOLN
INTRAVENOUS | Status: AC
  Administered 2016-11-01: 10 meq
  Filled 2016-11-01: qty 50

## 2016-11-01 MED ORDER — POTASSIUM CHLORIDE 10 MEQ/50ML IV SOLN
10.0000 meq | INTRAVENOUS | Status: AC
  Administered 2016-11-01 (×8): 10 meq via INTRAVENOUS
  Filled 2016-11-01 (×7): qty 50

## 2016-11-01 MED ORDER — LOPERAMIDE HCL 2 MG PO CAPS
4.0000 mg | ORAL_CAPSULE | Freq: Once | ORAL | Status: AC
  Administered 2016-11-01: 4 mg via ORAL
  Filled 2016-11-01: qty 2

## 2016-11-01 MED ORDER — ENSURE ENLIVE PO LIQD
237.0000 mL | ORAL | Status: DC
  Administered 2016-11-01 – 2016-11-02 (×2): 237 mL via ORAL

## 2016-11-01 MED ORDER — LABETALOL HCL 300 MG PO TABS
300.0000 mg | ORAL_TABLET | Freq: Two times a day (BID) | ORAL | Status: DC
  Administered 2016-11-01 – 2016-11-03 (×5): 300 mg via ORAL
  Filled 2016-11-01 (×6): qty 1

## 2016-11-01 NOTE — Significant Event (Signed)
Unit Director received a phone call from Child protective services Jeanmarie Plant [phone] concerning patient's children.  CPS wanted to speak to Gustavo Lah [phone] notified.

## 2016-11-01 NOTE — Progress Notes (Signed)
Sommer MD paged regarding need for pt to have CVC anymore, MD states okay to remove CVC once pt have PIV. Will continue to monitor.

## 2016-11-01 NOTE — Progress Notes (Signed)
Patient ID: Tara [name], female   DOB: 10/15/1991, 25 y.o.   MRN: [id number]     Advanced Heart Failure Rounding Note  Referring Physician: Dr. De Dios Primary Physician: None per patient.  Primary Cardiologist:  None  Reason for Consultation: ? Post-partum cardiomyopathy vs Diastolic HF  Subjective:    Echo 10/26/16 LVEF 60-65%, Mild AS, mild LAE, Mild TI, PA peak pressure 54 mm Hg. Normal RV  Pt remains critically ill.  CVL placed 10/28/16.  She continues to auto-diurese, net negative 1072 cc yesterday. Unable to get CVP off line this morning.   She is afebrile today. Remains on  Vancomycin/Zosyn. Extubated over night.   Thirsty. Wants to get OOB. Denies SOB.    Objective:   Weight Range: 241 lb 6.5 oz (109.5 kg) Body mass index is 38.96 kg/m.   Vital Signs:   Temp:  [98.7 F (37.1 C)-100.1 F (37.8 C)] 98.8 F (37.1 C) (11/13 0600) Pulse Rate:  [69-102] 83 (11/13 0710) Resp:  [0-32] 23 (11/13 0710) BP: (117-166)/(63-111) 166/76 (11/13 0710) SpO2:  [91 %-100 %] 96 % (11/13 0710) FiO2 (%):  [30 %-40 %] 32 % (11/12 1727) Last BM Date: 10/31/16  Weight change: Filed Weights   10/29/16 0400 10/30/16 0345 10/31/16 0455  Weight: 255 lb 1.2 oz (115.7 kg) 240 lb 4.8 oz (109 kg) 241 lb 6.5 oz (109.5 kg)    Intake/Output:   Intake/Output Summary (Last 24 hours) at 11/01/16 0738 Last data filed at 11/01/16 0616  Gross per 24 hour  Intake          2255.16 ml  Output             2345 ml  Net           -89.84 ml     Physical Exam: General: NAD. .  HEENT: Normal . + exopthalmos. Injected conjunctiva Neck: supple. JVP 6-7  cm. No lymphadenopathy noted. Thyroid diffusely enlarged.  Cor: PMI nondisplaced. Mildly tachy, regular. No murmurs appreciated. No S3/S4 Lungs: Decreased on 2 liters oxygen. .  Abdomen: Obese, soft, NT, ND, no HSM. No bruits or masses. +BS  Extremities: no cyanosis, clubbing, rash. No edema   Neuro: Remains intubated and sedated.   Telemetry:  Reviewed, NSR in 100s  Labs: CBC  Recent Labs  10/31/16 0300 11/01/16 0300  WBC 7.4 9.5  HGB 11.0* 10.7*  HCT 37.1 35.8*  MCV 73.8* 72.2*  PLT 286 307   Basic Metabolic Panel  Recent Labs  10/30/16 0355 10/31/16 0300 11/01/16 0300  NA 152* 155* 153*  K 3.3* 3.7 2.8*  CL 108 111 113*  CO2 36* 35* 30  GLUCOSE 131* 127* 118*  BUN 38* 40* 29*  CREATININE 0.85 0.92 0.85  CALCIUM 9.6 9.5 9.1  MG 2.8* 2.6*  --   PHOS 3.3 4.9*  --    Liver Function Tests No results for input(s): AST, ALT, ALKPHOS, BILITOT, PROT, ALBUMIN in the last 72 hours. No results for input(s): LIPASE, AMYLASE in the last 72 hours. Cardiac Enzymes No results for input(s): CKTOTAL, CKMB, CKMBINDEX, TROPONINI in the last 72 hours.  BNP: BNP (last 3 results)  Recent Labs  08/29/16 1018 10/24/16 1403 10/26/16 0624  BNP 15.2 205.8* 227.6*    ProBNP (last 3 results) No results for input(s): PROBNP in the last 8760 hours.   D-Dimer No results for input(s): DDIMER in the last 72 hours. Hemoglobin A1C No results for input(s): HGBA1C in the last 72 hours. Fasting   Lipid Panel  Recent Labs  10/30/16 0355  TRIG 162*   Thyroid Function Tests  Recent Labs  10/29/16 1000  TSH <0.010*    Other results:     Imaging/Studies:  Dg Chest Port 1 View  Result Date: 11/01/2016 CLINICAL DATA:  Acute hypoxemia, respiratory failure, sepsis and community-acquired pneumonia. EXAM: PORTABLE CHEST 1 VIEW COMPARISON:  Portable chest x-ray of October 31, 2016 FINDINGS: The trachea and esophagus have been extubated. The lungs remain hypoinflated. The interstitial markings are coarse especially on the right and in the retrocardiac region on the left. The cardiac silhouette is enlarged. The pulmonary vascularity is prominent centrally but less congested overall than on the previous study. The mediastinum is normal in width. The left internal jugular venous catheter tip projects over the proximal SVC.  IMPRESSION: Improved appearance of the pulmonary interstitium suggesting decreasing interstitial edema or pneumonia. Persistent hypo inflation following extubation. Stable cardiomegaly with central pulmonary vascularity. Electronically Signed   By: David  Jordan M.D.   On: 11/01/2016 07:02   Dg Chest Port 1 View  Result Date: 10/31/2016 CLINICAL DATA:  Acute hypoxemic respiratory failure EXAM: PORTABLE CHEST 1 VIEW COMPARISON:  10/30/2016 FINDINGS: Endotracheal tube tip is 2 cm above the carina. Nasogastric tube enters the stomach left internal jugular central line has its tip in the SVC just above the right atrium. Bilateral airspace filling has worsened, most likely worsen pneumonia. Some of this may relate to ventilatory timing. I think there is worsened volume loss in the left lower lobe IMPRESSION: Lines and tubes well positioned. Worsened pneumonia and left lower lobe volume loss. Electronically Signed   By: Mark  Shogry M.D.   On: 10/31/2016 06:39   Dg Abd Portable 1v  Result Date: 10/30/2016 CLINICAL DATA:  Cardiomyopathy, pulmonary edema and respiratory failure. Status post nasogastric tube placement. EXAM: PORTABLE ABDOMEN - 1 VIEW COMPARISON:  10/27/2016 FINDINGS: Gastric decompression tube visualized with the tip in the distal body of the stomach. Bowel gas pattern is normal without evidence of obstruction or ileus. No abnormal calcifications identified. Bony and soft tissue structures are unremarkable. IMPRESSION: Gastric decompression tube extends into the body of the stomach. Unremarkable bowel gas pattern. Electronically Signed   By: Glenn  Yamagata M.D.   On: 10/30/2016 09:17    Latest Echo  Latest Cath   Medications:     Scheduled Medications: . chlorhexidine gluconate (MEDLINE KIT)  15 mL Mouth Rinse BID  . enoxaparin (LOVENOX) injection  40 mg Subcutaneous Q24H  . famotidine  20 mg Per Tube BID  . mouth rinse  15 mL Mouth Rinse 10 times per day  . methimazole  15 mg Oral  TID  . piperacillin-tazobactam (ZOSYN)  IV  3.375 g Intravenous Q8H  . potassium chloride  10 mEq Intravenous Q1H  . sodium chloride flush  10-40 mL Intracatheter Q12H    Infusions: . dextrose 50 mL/hr at 10/31/16 0937    PRN Medications: acetaminophen, artificial tears, guaiFENesin, sodium chloride flush   Assessment/Plan   Rashika N Callejo is a 25 y.o. female with history of graves diease, HTN, s/p C-section 07/2016 complicated by post-partum HCAP, pregnancy complicated by pre-eclampsia. Treated for CAP in ED 10/24/16 and sent home.  Returned to ED and admitted 10/25/16 with acute respiratory failure with hypoxia.  Intubated early morning 10/27/16 with pulmonary edema and anxiety.  1. Acute respiratory failure s/p intubation 10/27/16: Suspect PNA with likely pulmonary edema component (?ARDS).  Blood cultures NGTD.  Afebrile today.  - Continue to   allow auto-diuresis today as long as it continues.  - Extubated 10/31/16. On nasal cannula 2 liters.  - Remains on vancomycin/Zosyn.  2. Acute on chronic diastolic CHF: Echo 58/5/27 LVEF 60-65%, Mild AS, mild LAE, Mild TI, PA peak pressure 54 mmHg, normal RV.  ? Etiology. Could be do to poorly controlled hyperthyroidism vs undiagnosed OSA/OHS with body habitus vs poorly controlled HTN  Volume status ok. No diuretics for now.  3. Graves disease: Methimazole, eventually will need definitive treatment.  4. Severe anxiety 5. ARF: Resolved. Renal following.  6. Hypernatremia: Sodium 153 Will need free water boluses, per CCM.  7. Hypokalemia- K 2.8 K runs ordered.    Length of Stay: 7  Amy Clegg NP-C  11/01/2016, 7:38 AM  Advanced Heart Failure Team Pager [phone] (M-F; [place])  Please contact [facility] for night-coverage after hours (4p -7a ) and weekends on [email or web address]  Patient seen and examined with Darrick Grinder, NP. We discussed all aspects of the encounter. I agree with the assessment and plan as stated above.    Now extubated. Volume  status and respiratory status stable. She is very thristy due to hyponatremia. Will allow her to drink. Supp K+. Hold diuretics for now. Watch BP. Continue to treat hyperthyroidism   Bensimhon, Daniel,MD 9:50 AM

## 2016-11-01 NOTE — Progress Notes (Signed)
Nutrition Follow-up  DOCUMENTATION CODES:   Morbid obesity  INTERVENTION:   Ensure Enlive po daily, each supplement provides 350 kcal and 20 grams of protein   NUTRITION DIAGNOSIS:   Increased nutrient needs related to acute illness, wound healing as evidenced by estimated needs. Ongoing.   GOAL:   Patient will meet greater than or equal to 90% of their needs Progressing.   MONITOR:   PO intake, Supplement acceptance, Skin  ASSESSMENT:   Pt with hx of recent pregnancy with healthy cesarean delivery 07/2016, 11/5 in ED with suspected PNA now admitted with dyspnea requiring intubation for respiratory failure from pulmonary edema.   11/12 extubated Pt able to eat 75% of her lunch today.  Pt very sleepy during visit, reports good appetite Per CCM possibly her hyperthyroidism was under-treated  Medications reviewed and include: KCl, D5 @ 50 Labs reviewed: Na 153, K+ 2.8, T3: 339, T4 4.81 CBG's: 107-110-111    Diet Order:  Diet 2 gram sodium Room service appropriate? Yes; Fluid consistency: Thin  Skin:  Wound (see comment) (MASD perineum and coccyx, stage II coccyx)  Last BM:  11/12  Height:   Ht Readings from Last 1 Encounters:  10/26/16 5\' 6"  (1.676 m)    Weight:   Wt Readings from Last 1 Encounters:  10/31/16 241 lb 6.5 oz (109.5 kg)    Ideal Body Weight:  59 kg  BMI:  Body mass index is 38.96 kg/m.  Estimated Nutritional Needs:   Kcal:  1800-2000  Protein:  100-115 grams  Fluid:  > 2 L/day  EDUCATION NEEDS:   No education needs identified at this time  Kendell Bane RD, LDN, CNSC [phone] Pager [phone] After Hours Pager

## 2016-11-01 NOTE — Progress Notes (Signed)
eLink Physician-Brief Progress Note Patient Name: Tara [name] DOB: Apr 16, 1991 MRN: [id number]   Date of Service  11/01/2016  HPI/Events of Note  K+ = 3.1 and Creatinine = 0.82.  eICU Interventions  Will replace K+.      Intervention Category Intermediate Interventions: Electrolyte abnormality - evaluation and management  Amato Sevillano [place] 11/01/2016, 10:47 PM

## 2016-11-01 NOTE — Progress Notes (Signed)
Name: Tara [name] MRN: [id number] DOB: [birth year]    ADMISSION DATE:  10/25/2016 CONSULTATION DATE:  10/25/2016  REFERRING MD :  Dr. 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 Course complicated by contrast induced AKI   SIGNIFICANT EVENTS  07/2016 delivery of healthy baby boy, post partum course complicated by PNA 11/5 ED for SOB, thought to be CAP and discharged with ABX 11/6 back to ED with worsening dyspnea.   STUDIES:  CTA chest 11/6 > negative for PE  SUBJECTIVE:  extubated 11/12 pm  VITAL SIGNS: Temp:  [98.1 F (36.7 C)-100.1 F (37.8 C)] 98.1 F (36.7 C) (11/13 0710) Pulse Rate:  [69-102] 83 (11/13 0710) Resp:  [0-32] 23 (11/13 0710) BP: (117-166)/(63-111) 166/76 (11/13 0710) SpO2:  [91 %-100 %] 96 % (11/13 0710) FiO2 (%):  [30 %-32 %] 32 % (11/12 1727)  PHYSICAL EXAMINATION: General:  Obese female sedated on vent. Neuro:  Sedate, not following commands on fent, when sedation light she is anxious HEENT:  [place]/AT, PERRL, exophthalmus,rt eye edema, red, no obvious JVD Cardiovascular:  Tachy, regular. +1 BLE edema, Lungs:  Coarse crackles and decreased bs  Abdomen:  Soft, non-tender, non-distended Musculoskeletal:  No acute deformity Skin:  Grossly intact  Recent Labs Lab 10/30/16 0355 10/31/16 0300 11/01/16 0300  NA 152* 155* 153*  K 3.3* 3.7 2.8*  CL 108 111 113*  CO2 36* 35* 30  BUN 38* 40* 29*  CREATININE 0.85 0.92 0.85  GLUCOSE 131* 127* 118*    Recent Labs Lab 10/30/16 0355 10/31/16 0300 11/01/16 0300  HGB 10.7* 11.0* 10.7*  HCT 36.7 37.1 35.8*  WBC 11.1* 7.4 9.5  PLT 310 286 307   Dg Chest Port 1 View  Result Date: 11/01/2016 CLINICAL DATA:  Acute hypoxemia, respiratory failure, sepsis and community-acquired pneumonia. EXAM: PORTABLE CHEST 1 VIEW COMPARISON:  Portable chest x-ray of October 31, 2016 FINDINGS:  The trachea and esophagus have been extubated. The lungs remain hypoinflated. The interstitial markings are coarse especially on the right and in the retrocardiac region on the left. The cardiac silhouette is enlarged. The pulmonary vascularity is prominent centrally but less congested overall than on the previous study. The mediastinum is normal in width. The left internal jugular venous catheter tip projects over the proximal SVC. IMPRESSION: Improved appearance of the pulmonary interstitium suggesting decreasing interstitial edema or pneumonia. Persistent hypo inflation following extubation. Stable cardiomegaly with central pulmonary vascularity. Electronically Signed   By: David  [place] M.D.   On: 11/01/2016 07:02   Dg Chest Port 1 View  Result Date: 10/31/2016 CLINICAL DATA:  Acute hypoxemic respiratory failure EXAM: PORTABLE CHEST 1 VIEW COMPARISON:  10/30/2016 FINDINGS: Endotracheal tube tip is 2 cm above the carina. Nasogastric tube enters the stomach left internal jugular central line has its tip in the SVC just above the right atrium. Bilateral airspace filling has worsened, most likely worsen pneumonia. Some of this may relate to ventilatory timing. I think there is worsened volume loss in the left lower lobe IMPRESSION: Lines and tubes well positioned. Worsened pneumonia and left lower lobe volume loss. Electronically Signed   By: Paulina Fusi M.D.   On: 10/31/2016 06:39     STUDIES:  Chest CT with no PE, pulmonary edema  CULTURES: Blood 11/7>>>ng 11/8 sputum>>ng 11/6 RVP neg   ANTIBIOTICS: Vanc 11/7>>>11/8 Zosyn 11/7>>> 11/13 Zithromax 11/7>>>11/12  SIGNIFICANT  EVENTS: 11/8 intubated 11/8 hypotensive with diuresis   LINES/TUBES: ETT 11/8>>> 11/12 11/9 lij cvl>>  DISCUSSION: Extubated successfully last evening   ASSESSMENT / PLAN:  PULMONARY A: Acute pulmonary edema leading to respiratory failure, procalcitonin <0.1, afebrile, unlikely infection, cultures  sent. BASDZ P:   Pulm hygiene Allow her to continue to auto-diurese Abx as below, plan d/c and follow  CARDIOVASCULAR A:  Post partum cardiomyopathy, ?myocarditis. EF 60-65% Pulmonary HTN with normal RV size 11/7  P:  - Cardiology consult appreciated - Auto-immune work neg - auto diuresing - restart home labetalol    RENAL Renal insuff Hyperkalemia -resolved Hypernatremia  P:  Replete lytes Allow her to drink, correct for her I/O losses  GASTROINTESTINAL A:   No active issues P:   Start PO diet on 11/13 D/c pepcid  HEMATOLOGIC A:   Leukocytosis, likely a stress response P:  - resolved  INFECTIOUS A:   Doubt PNA based on rapid improvement infiltrates, other negative workup P:   - cultures ngtd - d/c pip/tazo 11/13 and follow off abx  ENDOCRINE A:   Graves dz, T3 339, free T4 4.8. She was changed from PTU to methimazole when she was pregnant. ? Whether her pre-ecclampsia was actually under-treated hyperthyroidism.  Free T4 high P:   - Continue tapazole for now, consider change back to PTU now that she has delivered, may need endocrine input here for definitive therapy; suspect she needs radio-ablation vs surgery - b blocker restarted 11/13  NEUROLOGIC A:   Severe anxiety, improved post extubation P:   RASS goal: 0 - avoid sedation as able.    FAMILY  - Updates: No family bedside  - Inter-disciplinary family meet or Palliative Care meeting due by: NA   To telemetry bed 11/13, will ask TRH to assume her care.   Levy Pupaobert Byrum, MD, PhD 11/01/2016, 10:58 AM [facility] [phone] or if no answer [phone]

## 2016-11-01 NOTE — Progress Notes (Signed)
[facility] ADULT ICU REPLACEMENT PROTOCOL FOR AM LAB REPLACEMENT ONLY  The patient does apply for the [facility] Adult ICU Electrolyte Replacment Protocol based on the criteria listed below:   1. Is GFR >/= 40 ml/min? Yes.    Patient's GFR today is >60 2. Is urine output >/= 0.5 ml/kg/hr for the last 6 hours? Yes.   Patient's UOP is 0.5 ml/kg/hr 3. Is BUN < 60 mg/dL? Yes.    Patient's BUN today is 29 4. Abnormal electrolytes  K 2.8 5. Ordered repletion with: per protocol 6. If a panic level lab has been reported, has the CCM MD in charge been notified? Yes.  .   Physician:  Christene Slates  Hayden Rasmussen [facility] 11/01/2016 5:32 AM

## 2016-11-02 DIAGNOSIS — I5033 Acute on chronic diastolic (congestive) heart failure: Secondary | ICD-10-CM

## 2016-11-02 DIAGNOSIS — J80 Acute respiratory distress syndrome: Secondary | ICD-10-CM

## 2016-11-02 LAB — BASIC METABOLIC PANEL
Anion gap: 9 (ref 5–15)
BUN: 16 mg/dL (ref 6–20)
CALCIUM: 8.7 mg/dL — AB (ref 8.9–10.3)
CHLORIDE: 108 mmol/L (ref 101–111)
CO2: 28 mmol/L (ref 22–32)
CREATININE: 0.79 mg/dL (ref 0.44–1.00)
GFR calc Af Amer: >60 mL/min (ref >60)
GFR calc non Af Amer: >60 mL/min (ref >60)
Glucose, Bld: 112 mg/dL — ABNORMAL HIGH (ref 65–99)
Potassium: 3.1 mmol/L — ABNORMAL LOW (ref 3.5–5.1)
SODIUM: 145 mmol/L (ref 135–145)

## 2016-11-02 LAB — MAGNESIUM
MAGNESIUM: 2.1 mg/dL (ref 1.7–2.4)
Magnesium: 2 mg/dL (ref 1.7–2.4)

## 2016-11-02 LAB — GLUCOSE, CAPILLARY
GLUCOSE-CAPILLARY: 104 mg/dL — AB (ref 65–99)
GLUCOSE-CAPILLARY: 104 mg/dL — AB (ref 65–99)
GLUCOSE-CAPILLARY: 112 mg/dL — AB (ref 65–99)
GLUCOSE-CAPILLARY: 150 mg/dL — AB (ref 65–99)
Glucose-Capillary: 111 mg/dL — ABNORMAL HIGH (ref 65–99)
Glucose-Capillary: 97 mg/dL (ref 65–99)

## 2016-11-02 LAB — POTASSIUM: POTASSIUM: 3.4 mmol/L — AB (ref 3.5–5.1)

## 2016-11-02 MED ORDER — LOSARTAN POTASSIUM 50 MG PO TABS
50.0000 mg | ORAL_TABLET | Freq: Every day | ORAL | Status: DC
  Administered 2016-11-02 – 2016-11-03 (×2): 50 mg via ORAL
  Filled 2016-11-02 (×2): qty 1

## 2016-11-02 MED ORDER — SPIRONOLACTONE 25 MG PO TABS
12.5000 mg | ORAL_TABLET | Freq: Every day | ORAL | Status: DC
  Administered 2016-11-02 – 2016-11-03 (×2): 12.5 mg via ORAL
  Filled 2016-11-02 (×2): qty 1

## 2016-11-02 MED ORDER — POTASSIUM CHLORIDE CRYS ER 20 MEQ PO TBCR
30.0000 meq | EXTENDED_RELEASE_TABLET | ORAL | Status: AC
  Administered 2016-11-02 (×2): 30 meq via ORAL
  Filled 2016-11-02 (×2): qty 1

## 2016-11-02 MED ORDER — WHITE PETROLATUM GEL
Status: AC
  Administered 2016-11-02: 23:00:00
  Filled 2016-11-02: qty 1

## 2016-11-02 MED ORDER — POTASSIUM CHLORIDE CRYS ER 20 MEQ PO TBCR
30.0000 meq | EXTENDED_RELEASE_TABLET | ORAL | Status: DC
  Administered 2016-11-02: 30 meq via ORAL
  Filled 2016-11-02: qty 1

## 2016-11-02 MED ORDER — POTASSIUM CHLORIDE CRYS ER 20 MEQ PO TBCR
50.0000 meq | EXTENDED_RELEASE_TABLET | Freq: Once | ORAL | Status: AC
  Administered 2016-11-02: 50 meq via ORAL
  Filled 2016-11-02: qty 2

## 2016-11-02 MED ORDER — POTASSIUM CHLORIDE CRYS ER 20 MEQ PO TBCR: 50.0000 meq | EXTENDED_RELEASE_TABLET | Freq: Once | ORAL | Status: DC

## 2016-11-02 NOTE — Progress Notes (Signed)
PROGRESS NOTE    Tara [name]  IDR:[id number] DOB: Apr 08, 1991 DOA: 10/25/2016 PCP: No PCP Per Patient   Brief Narrative:  25 y.o. BF PMHx Graves' disease, HTN, Myocardial infarction,Chronic bronchitis , and recent Cesarean delivery complicated by post-partum HCAP (August)   Who presents to the ED for evaluation of fever, cough, and shortness of breath.  The patient was actually in the ED yesterday with similar complaints.  She did not have an oxygen requirement then.  Chest xray showed bilateral airspace opacities, favoring pneumonia.  She received a dose of IV Rocephin and was discharged with a prescription for a Z pack.  She comes back to the ED today because she has become significantly worse in the past 24 hours.  She was hypoxic to 85% upon arrival, and is requiring 6L [place] to maintain her O2 sats.  She has had chest pain/tightness today.  She is now producing a blood tinged sputum.  She reports fever to 105 at home with associated chills and sweats.  She denies sick contacts.  She takes an annual flu shot.   She is also complaining of pain and swelling in both legs.  She denies increased abdominal girth.    Subjective: 11/14  A/O 4,, negative CP, positive SOB.      Assessment & Plan:   Principal Problem:   CAP (community acquired pneumonia) Active Problems:   Graves disease   Acute respiratory failure with hypoxia (HCC)   Sepsis (HCC)   Acute respiratory failure (HCC)   Sepsis due to pneumonia (HCC)   Dilated cardiomyopathy (HCC)   Pulmonary hypertension   Pressure injury of skin   Sepsis pneumonia/ CAP/ Acute Respiratory Failure with Hypoxia  -CTA chest consistent with pneumonia, pulmonary edema, and ARDS -Titrate O2 to maintain SPO2> 93% -Continuous pulse ox -Completed course of antibiotics --Blood cultures/urine legionella/urine strep antigen negative --Respiratory virus panel negative --HIV normal in July -Respiratory failure resolved. -Ambulatory SPO2  pending. -Patient ready for discharge in the next 24-48 hrs.  Cardiomyopathy/Severe Pulmonary Hypertension/Acute on Chronic Diastolic CHF -Strict in and out since admission - 5.5 L -Daily weight Filed Weights   10/29/16 0400 10/30/16 0345 10/31/16 0455  Weight: 115.7 kg (255 lb 1.2 oz) 109 kg (240 lb 4.8 oz) 109.5 kg (241 lb 6.5 oz)  -Auto diuresing -Labetalol 300 mg BID -Losartan 50 mg daily -Spironolactone 12.5 mg daily -CHF team following -Schedule follow-up with Dr.Daniel R Bensimhon CHF team in 1-2 weeks  Graves' disease,  -undetectable TSH consistent with hyperthyroid state but clinical picture does not appear consistent with thyroid storm at this point (no refractory hyperpyrexia, mental status changes, hypotension). --Free T4 remains elevated. Review of patient's MAR shows that patient last had them  increased in September to 15 mg BID. Will increase to 15 mg TID on this admission. -Schedule establish care in 1-2 weeks for cardiomyopathy/acute on chronic diastolic CHF. Graves' disease uncontrolled (will need referral to Endocrinologist)  -Will need definitive treatment by endocrinologist  Anxiety  -Resolved   Hypokalemia -Potassium goal> 4 -K-Dur 50 mEq -Repeat K/Mg at 1500. Repeat K-Dur   Hypernatremia -Resolved    DVT prophylaxis: Lovenox Code Status: Full Family Communication: Family present Disposition Plan:  Discharge in next 24-48 hours   Consultants:  [facility] M Dr.Daniel R Bensimhon CHF team    Procedures/Significant Events:  11/6 CT Angiogram Chest: Negative PE- Diffuse airspace consolidation in both lungs may indicate edema, pneumonia, or ARDS. -Cardiac enlargement with Evidence of right heart failure 11/6 Echocardiogram:Left  ventricle: mild LVH. -LVEF 60% to 65%. (Improved from 2/22 when EF was 40%)- Left atrium: mildly dilated. - Tricuspid valve: There was mild regurgitation. - Pulmonary arteries: PA peak pressure: 54 mm Hg (S). 11/8  intubated 11/8 hypotensive with diuresis     Cultures 11/6 urine Negative 11/6 blood right AC/left arm negative 11/6 respiratory virus panel negative 11/7 blood left arm/hand negative 11/7 MRSA by PCR positive 11/8 tracheal aspirate negative 11/10 blood right hand 2 NGTD    Antimicrobials: Azithromycin 11/6>> 11/ 12 Ceftriaxone 11/6>> 11/8 Vanc 11/7>>>11/8 Zosyn 11/7>>> 11/13  Devices    LINES / TUBES:  ETT 11/8>>> 11/12 11/9 lij cvl>>    Continuous Infusions: . dextrose 50 mL/hr at 11/02/16 0600     Objective: Vitals:   11/02/16 0300 11/02/16 0400 11/02/16 0500 11/02/16 0600  BP: (!) 145/74 (!) 158/69 97/78 (!) 144/84  Pulse: 84 80 89 81  Resp: (!) 30 (!) 40 (!) 29 (!) 39  Temp:  98.4 F (36.9 C)    TempSrc:  Oral    SpO2: 95% 95% 95% 98%  Weight:      Height:        Intake/Output Summary (Last 24 hours) at 11/02/16 1324 Last data filed at 11/02/16 0600  Gross per 24 hour  Intake             3095 ml  Output             1325 ml  Net             1770 ml   Filed Weights   10/29/16 0400 10/30/16 0345 10/31/16 0455  Weight: 115.7 kg (255 lb 1.2 oz) 109 kg (240 lb 4.8 oz) 109.5 kg (241 lb 6.5 oz)    Examination:  General: A/O 4, Negative Acute respiratory distress Eyes: negative scleral hemorrhage, negative anisocoria, negative icterus ENT: Negative Runny nose, negative gingival bleeding, Neck:  Negative scars, masses, torticollis, lymphadenopathy, JVD Lungs: clear to auscultation bilateral, negative wheezes or crackles Cardiovascular:  Regular rhythm and rate  without murmur gallop or rub normal S1 and S2 Abdomen: negative abdominal pain, nondistended, positive soft, bowel sounds, no rebound, no ascites, no appreciable mass Extremities: No significant cyanosis, clubbing, or edema bilateral lower extremities Skin: Negative rashes, lesions, ulcers Psychiatric:  Negative depression, negative anxiety, negative fatigue, negative mania  Central  nervous system:  Cranial nerves II through XII intact, tongue/uvula midline, all extremities muscle strength 5/5, sensation intact throughout, negative dysarthria, negative expressive aphasia, negative receptive aphasia.  .     Data Reviewed: Care during the described time interval was provided by me .  I have reviewed this patient's available data, including medical history, events of note, physical examination, and all test results as part of my evaluation. I have personally reviewed and interpreted all radiology studies.  CBC:  Recent Labs Lab 10/26/16 0836  10/28/16 0510 10/29/16 0223 10/30/16 0355 10/31/16 0300 11/01/16 0300  WBC 9.3  < > 13.3* 10.4 11.1* 7.4 9.5  NEUTROABS 8.0*  --   --   --   --   --   --   HGB 10.5*  < > 11.4* 10.6* 10.7* 11.0* 10.7*  HCT 33.3*  < > 39.4 35.3* 36.7 37.1 35.8*  MCV 70.3*  < > 76.4* 72.2* 73.3* 73.8* 72.2*  PLT 239  < > 296 259 310 286 307  < > = values in this interval not displayed. Basic Metabolic Panel:  Recent Labs Lab 10/28/16 0510  10/28/16  1700 10/29/16 0223 10/30/16 0355 10/31/16 0300 11/01/16 0300 11/01/16 2055 11/02/16 0158  NA 137  < >  --  146* 152* 155* 153* 143 145  K 5.8*  < >  --  3.9 3.3* 3.7 2.8* 3.1* 3.1*  CL 103  < >  --  109 108 111 113* 105 108  CO2 25  < >  --  28 36* 35* '30 28 28  '$ GLUCOSE 101*  < >  --  116* 131* 127* 118* 115* 112*  BUN 49*  < >  --  51* 38* 40* 29* 20 16  CREATININE 2.05*  < >  --  1.50* 0.85 0.92 0.85 0.82 0.79  CALCIUM 8.3*  < >  --  9.0 9.6 9.5 9.1 8.9 8.7*  MG 2.5*  --  2.8* 3.0* 2.8* 2.6*  --   --   --   PHOS 9.1*  --  5.2* 5.2* 3.3 4.9*  --   --   --   < > = values in this interval not displayed. GFR: Estimated Creatinine Clearance: 132.4 mL/min (by C-G formula based on SCr of 0.79 mg/dL). Liver Function Tests:  Recent Labs Lab 10/26/16 0836  AST 29  ALT 18  ALKPHOS 121  BILITOT 0.6  PROT 6.8  ALBUMIN 3.0*   No results for input(s): LIPASE, AMYLASE in the last 168  hours. No results for input(s): AMMONIA in the last 168 hours. Coagulation Profile: No results for input(s): INR, PROTIME in the last 168 hours. Cardiac Enzymes:  Recent Labs Lab 10/26/16 1105  TROPONINI <0.03   BNP (last 3 results) No results for input(s): PROBNP in the last 8760 hours. HbA1C: No results for input(s): HGBA1C in the last 72 hours. CBG:  Recent Labs Lab 11/01/16 1126 11/01/16 1655 11/02/16 0033 11/02/16 0523 11/02/16 0747  GLUCAP 205* 93 104* 111* 97   Lipid Profile: No results for input(s): CHOL, HDL, LDLCALC, TRIG, CHOLHDL, LDLDIRECT in the last 72 hours. Thyroid Function Tests:  Recent Labs  10/30/16 1015  FREET4 4.81*   Anemia Panel: No results for input(s): VITAMINB12, FOLATE, FERRITIN, TIBC, IRON, RETICCTPCT in the last 72 hours. Urine analysis:    Component Value Date/Time   COLORURINE AMBER (A) 10/25/2016 1857   APPEARANCEUR CLEAR 10/25/2016 1857   LABSPEC 1.023 10/25/2016 1857   PHURINE 6.5 10/25/2016 1857   GLUCOSEU NEGATIVE 10/25/2016 1857   HGBUR NEGATIVE 10/25/2016 1857   BILIRUBINUR NEGATIVE 10/25/2016 1857   KETONESUR NEGATIVE 10/25/2016 1857   PROTEINUR 30 (A) 10/25/2016 1857   UROBILINOGEN 0.2 08/25/2016 1014   NITRITE NEGATIVE 10/25/2016 1857   LEUKOCYTESUR NEGATIVE 10/25/2016 1857   Sepsis Labs: '@[email or web address]'$ (procalcitonin:4,lacticidven:4)  ) Recent Results (from the past 240 hour(s))  Blood Culture (routine x 2)     Status: None   Collection Time: 10/25/16  6:51 PM  Result Value Ref Range Status   Specimen Description BLOOD RIGHT ANTECUBITAL  Final   Special Requests BOTTLES DRAWN AEROBIC AND ANAEROBIC 5ML  Final   Culture NO GROWTH 5 DAYS  Final   Report Status 10/30/2016 FINAL  Final  Urine culture     Status: None   Collection Time: 10/25/16  6:57 PM  Result Value Ref Range Status   Specimen Description URINE, RANDOM  Final   Special Requests NONE  Final   Culture NO GROWTH  Final   Report Status 10/27/2016  FINAL  Final  Blood Culture (routine x 2)     Status: None   Collection Time:  10/25/16  7:32 PM  Result Value Ref Range Status   Specimen Description BLOOD LEFT ARM  Final   Special Requests BOTTLES DRAWN AEROBIC AND ANAEROBIC 5CC  Final   Culture NO GROWTH 5 DAYS  Final   Report Status 10/30/2016 FINAL  Final  Respiratory Panel by PCR     Status: None   Collection Time: 10/25/16 11:06 PM  Result Value Ref Range Status   Adenovirus NOT DETECTED NOT DETECTED Final   Coronavirus 229E NOT DETECTED NOT DETECTED Final   Coronavirus HKU1 NOT DETECTED NOT DETECTED Final   Coronavirus NL63 NOT DETECTED NOT DETECTED Final   Coronavirus OC43 NOT DETECTED NOT DETECTED Final   Metapneumovirus NOT DETECTED NOT DETECTED Final   Rhinovirus / Enterovirus NOT DETECTED NOT DETECTED Final   Influenza A NOT DETECTED NOT DETECTED Final   Influenza B NOT DETECTED NOT DETECTED Final   Parainfluenza Virus 1 NOT DETECTED NOT DETECTED Final   Parainfluenza Virus 2 NOT DETECTED NOT DETECTED Final   Parainfluenza Virus 3 NOT DETECTED NOT DETECTED Final   Parainfluenza Virus 4 NOT DETECTED NOT DETECTED Final   Respiratory Syncytial Virus NOT DETECTED NOT DETECTED Final   Bordetella pertussis NOT DETECTED NOT DETECTED Final   Chlamydophila pneumoniae NOT DETECTED NOT DETECTED Final   Mycoplasma pneumoniae NOT DETECTED NOT DETECTED Final  MRSA PCR Screening     Status: Abnormal   Collection Time: 10/26/16  5:11 AM  Result Value Ref Range Status   MRSA by PCR POSITIVE (A) NEGATIVE Final    Comment:        The GeneXpert MRSA Assay (FDA approved for NASAL specimens only), is one component of a comprehensive MRSA colonization surveillance program. It is not intended to diagnose MRSA infection nor to guide or monitor treatment for MRSA infections. RESULT CALLED TO, READ BACK BY AND VERIFIED WITH: M HOPPER,RN '@[id number]'$  10/26/16 MKELLY,MLT   Culture, blood (routine x 2) Call MD if unable to obtain prior to  antibiotics being given     Status: None   Collection Time: 10/26/16  6:07 AM  Result Value Ref Range Status   Specimen Description BLOOD LEFT ARM  Final   Special Requests IN PEDIATRIC BOTTLE 3ML  Final   Culture NO GROWTH 5 DAYS  Final   Report Status 10/31/2016 FINAL  Final  Culture, blood (routine x 2) Call MD if unable to obtain prior to antibiotics being given     Status: None   Collection Time: 10/26/16  6:12 AM  Result Value Ref Range Status   Specimen Description BLOOD LEFT HAND  Final   Special Requests IN PEDIATRIC BOTTLE 3ML  Final   Culture NO GROWTH 5 DAYS  Final   Report Status 10/31/2016 FINAL  Final  Culture, respiratory (NON-Expectorated)     Status: None   Collection Time: 10/27/16  3:17 AM  Result Value Ref Range Status   Specimen Description TRACHEAL ASPIRATE  Final   Special Requests Normal  Final   Gram Stain   Final    MODERATE WBC PRESENT,BOTH PMN AND MONONUCLEAR RARE GRAM POSITIVE COCCI IN PAIRS    Culture Consistent with normal respiratory flora.  Final   Report Status 10/29/2016 FINAL  Final  Culture, blood (Routine X 2) w Reflex to ID Panel     Status: None (Preliminary result)   Collection Time: 10/29/16  1:25 AM  Result Value Ref Range Status   Specimen Description BLOOD RIGHT HAND  Final   Special Requests AEROBIC BOTTLE  ONLY 3ML  Final   Culture NO GROWTH 3 DAYS  Final   Report Status PENDING  Incomplete  Culture, blood (Routine X 2) w Reflex to ID Panel     Status: None (Preliminary result)   Collection Time: 10/29/16  1:30 AM  Result Value Ref Range Status   Specimen Description BLOOD RIGHT HAND  Final   Special Requests IN PEDIATRIC BOTTLE 3ML  Final   Culture NO GROWTH 3 DAYS  Final   Report Status PENDING  Incomplete  C difficile quick scan w PCR reflex     Status: None   Collection Time: 11/01/16  6:00 PM  Result Value Ref Range Status   C Diff antigen NEGATIVE NEGATIVE Final   C Diff toxin NEGATIVE NEGATIVE Final   C Diff  interpretation No C. difficile detected.  Final         Radiology Studies: Dg Chest Port 1 View  Result Date: 11/01/2016 CLINICAL DATA:  Acute hypoxemia, respiratory failure, sepsis and community-acquired pneumonia. EXAM: PORTABLE CHEST 1 VIEW COMPARISON:  Portable chest x-ray of October 31, 2016 FINDINGS: The trachea and esophagus have been extubated. The lungs remain hypoinflated. The interstitial markings are coarse especially on the right and in the retrocardiac region on the left. The cardiac silhouette is enlarged. The pulmonary vascularity is prominent centrally but less congested overall than on the previous study. The mediastinum is normal in width. The left internal jugular venous catheter tip projects over the proximal SVC. IMPRESSION: Improved appearance of the pulmonary interstitium suggesting decreasing interstitial edema or pneumonia. Persistent hypo inflation following extubation. Stable cardiomegaly with central pulmonary vascularity. Electronically Signed   By: David  Martinique M.D.   On: 11/01/2016 07:02        Scheduled Meds: . chlorhexidine gluconate (MEDLINE KIT)  15 mL Mouth Rinse BID  . enoxaparin (LOVENOX) injection  40 mg Subcutaneous Q24H  . feeding supplement (ENSURE ENLIVE)  237 mL Oral Q24H  . labetalol  300 mg Oral BID  . methimazole  15 mg Oral TID  . potassium chloride  30 mEq Oral Q4H  . sodium chloride flush  10-40 mL Intracatheter Q12H  . spironolactone  12.5 mg Oral Daily   Continuous Infusions: . dextrose 50 mL/hr at 11/02/16 0600     LOS: 8 days    Time spent: 40 minutes    Lakechia Nay, Geraldo Docker, MD [facility] Pager [phone]   If 7PM-7AM, please contact night-coverage [email or web address] Password [email or web address] 11/02/2016, 8:14 AM

## 2016-11-02 NOTE — Progress Notes (Signed)
Patient ID: Tara [name], female   DOB: [birth year], 25 y.o.   MRN: [id number]     Advanced Heart Failure Rounding Note  Referring Physician: Dr. Corrie Dandy Primary Physician: None per patient.  Primary Cardiologist:  None  Reason for Consultation: ? Post-partum cardiomyopathy vs Diastolic HF  Subjective:    Echo 10/26/16 LVEF 60-65%, Mild AS, mild LAE, Mild TI, PA peak pressure 54 mm Hg. Normal RV  Weight down 29 pounds. Off all diuretics and antibiotics. Weak getting out of bed.   She is afebrile today.   Denies SOB.    Objective:   Weight Range: 241 lb 6.5 oz (109.5 kg) Body mass index is 38.96 kg/m.   Vital Signs:   Temp:  [97.8 F (36.6 C)-98.7 F (37.1 C)] 98.4 F (36.9 C) (11/14 0400) Pulse Rate:  [80-117] 81 (11/14 0600) Resp:  [17-41] 39 (11/14 0600) BP: (97-158)/(55-107) 144/84 (11/14 0600) SpO2:  [92 %-100 %] 98 % (11/14 0600) Last BM Date: 11/01/16  Weight change: Filed Weights   10/29/16 0400 10/30/16 0345 10/31/16 0455  Weight: 255 lb 1.2 oz (115.7 kg) 240 lb 4.8 oz (109 kg) 241 lb 6.5 oz (109.5 kg)    Intake/Output:   Intake/Output Summary (Last 24 hours) at 11/02/16 0743 Last data filed at 11/02/16 0600  Gross per 24 hour  Intake             3100 ml  Output             1550 ml  Net             1550 ml     Physical Exam: General: NAD. In bed.  HEENT: Normal . + exopthalmos. Injected conjunctiva Neck: supple. JVP 6-7  cm. No lymphadenopathy noted. Thyroid diffusely enlarged.  Cor: PMI nondisplaced. Mildly tachy, regular. No murmurs appreciated. No S3/S4 Lungs: Clear. On room air.   Abdomen: Obese, soft, NT, ND, no HSM. No bruits or masses. +BS  Extremities: no cyanosis, clubbing, rash. No edema   Neuro: Remains intubated and sedated.   Telemetry: Reviewed, NSR in 100s  Labs: CBC  Recent Labs  10/31/16 0300 11/01/16 0300  WBC 7.4 9.5  HGB 11.0* 10.7*  HCT 37.1 35.8*  MCV 73.8* 72.2*  PLT 286 132   Basic Metabolic  Panel  Recent Labs  10/31/16 0300  11/01/16 2055 11/02/16 0158  NA 155*  < > 143 145  K 3.7  < > 3.1* 3.1*  CL 111  < > 105 108  CO2 35*  < > 28 28  GLUCOSE 127*  < > 115* 112*  BUN 40*  < > 20 16  CREATININE 0.92  < > 0.82 0.79  CALCIUM 9.5  < > 8.9 8.7*  MG 2.6*  --   --   --   PHOS 4.9*  --   --   --   < > = values in this interval not displayed. Liver Function Tests No results for input(s): AST, ALT, ALKPHOS, BILITOT, PROT, ALBUMIN in the last 72 hours. No results for input(s): LIPASE, AMYLASE in the last 72 hours. Cardiac Enzymes No results for input(s): CKTOTAL, CKMB, CKMBINDEX, TROPONINI in the last 72 hours.  BNP: BNP (last 3 results)  Recent Labs  08/29/16 1018 10/24/16 1403 10/26/16 0624  BNP 15.2 205.8* 227.6*    ProBNP (last 3 results) No results for input(s): PROBNP in the last 8760 hours.   D-Dimer No results for input(s): DDIMER in the last 72  hours. Hemoglobin A1C No results for input(s): HGBA1C in the last 72 hours. Fasting Lipid Panel No results for input(s): CHOL, HDL, LDLCALC, TRIG, CHOLHDL, LDLDIRECT in the last 72 hours. Thyroid Function Tests No results for input(s): TSH, T4TOTAL, T3FREE, THYROIDAB in the last 72 hours.  Invalid input(s): FREET3  Other results:     Imaging/Studies:  Dg Chest Port 1 View  Result Date: 11/01/2016 CLINICAL DATA:  Acute hypoxemia, respiratory failure, sepsis and community-acquired pneumonia. EXAM: PORTABLE CHEST 1 VIEW COMPARISON:  Portable chest x-ray of October 31, 2016 FINDINGS: The trachea and esophagus have been extubated. The lungs remain hypoinflated. The interstitial markings are coarse especially on the right and in the retrocardiac region on the left. The cardiac silhouette is enlarged. The pulmonary vascularity is prominent centrally but less congested overall than on the previous study. The mediastinum is normal in width. The left internal jugular venous catheter tip projects over the  proximal SVC. IMPRESSION: Improved appearance of the pulmonary interstitium suggesting decreasing interstitial edema or pneumonia. Persistent hypo inflation following extubation. Stable cardiomegaly with central pulmonary vascularity. Electronically Signed   By: David  Martinique M.D.   On: 11/01/2016 07:02    Latest Echo  Latest Cath   Medications:     Scheduled Medications: . chlorhexidine gluconate (MEDLINE KIT)  15 mL Mouth Rinse BID  . enoxaparin (LOVENOX) injection  40 mg Subcutaneous Q24H  . feeding supplement (ENSURE ENLIVE)  237 mL Oral Q24H  . labetalol  300 mg Oral BID  . methimazole  15 mg Oral TID  . potassium chloride  30 mEq Oral Q4H  . sodium chloride flush  10-40 mL Intracatheter Q12H    Infusions: . dextrose 50 mL/hr at 11/02/16 0600    PRN Medications: acetaminophen, artificial tears, guaiFENesin, sodium chloride flush   Assessment/Plan   Tara [name] is a 25 y.o. female with history of graves diease, HTN, s/p C-section 07/9380 complicated by post-partum HCAP, pregnancy complicated by pre-eclampsia. Treated for CAP in ED 10/24/16 and sent home.  Returned to ED and admitted 10/25/16 with acute respiratory failure with hypoxia.  Intubated early morning 10/27/16 with pulmonary edema and anxiety.  1. Acute respiratory failure s/p intubation 10/27/16: Suspect PNA with likely pulmonary edema component (?ARDS).  Blood cultures NGTD.  Afebrile today.  - Continue to allow auto-diuresis today as long as it continues.  - Extubated 10/31/16. On room air.   - Completed antibiotics.  2. Acute on chronic diastolic CHF: Echo 12/26/49 LVEF 60-65%, Mild AS, mild LAE, Mild TI, PA peak pressure 54 mmHg, normal RV.  ? Etiology. Could be do to poorly controlled hyperthyroidism vs undiagnosed OSA/OHS with body habitus vs poorly controlled HTN  Volume status ok. No diuretics for now. Overall weight down 29 pounds.  3. Graves disease: Methimazole, eventually will need definitive treatment.   4. Severe anxiety 5. ARF: Resolved. Renal following.  6. Hypernatremia: Sodium 145.   7. Hypokalemia- K 3.1  K runs ordered.  8. Deconditioning - consult PT.  9. HTN- Add 12.5 mg spiro daily and losartan. Ok to use as she is not breast feeding.    Length of Stay: [place] NP-C  11/02/2016, 7:43 AM  Advanced Heart Failure Team Pager [phone] (M-F; [place])  Please contact [facility] for night-coverage after hours (4p -7a ) and weekends on [email or web address]  Patient seen and examined with Darrick Grinder, NP. We discussed all aspects of the encounter. I agree with the assessment and plan as stated  above.   Continues to improve. Volume status looks good. Hypernatremia improved. BP still up. Will add spiro and losartan. Supp K. Renal function ok. Continue treatment for hyperthyroidism   Loney Domingo,MD 11:18 AM

## 2016-11-02 NOTE — Progress Notes (Signed)
[facility] ADULT ICU REPLACEMENT PROTOCOL FOR AM LAB REPLACEMENT ONLY  The patient does apply for the [facility] Adult ICU Electrolyte Replacment Protocol based on the criteria listed below:   1. Is GFR >/= 40 ml/min? Yes.    Patient's GFR today is >60 2. Is urine output >/= 0.5 ml/kg/hr for the last 6 hours? Yes.   Patient's UOP is 0.76 ml/kg/hr 3. Is BUN < 60 mg/dL? Yes.    Patient's BUN today is 16 4. Abnormal electrolyte  K 3.1 5. Ordered repletion with: per protocol 6. If a panic level lab has been reported, has the CCM MD in charge been notified? Yes.  .   Physician:  Christene Slates  Hayden Rasmussen McEachran 11/02/2016 3:44 AM

## 2016-11-03 DIAGNOSIS — I5033 Acute on chronic diastolic (congestive) heart failure: Secondary | ICD-10-CM

## 2016-11-03 LAB — BASIC METABOLIC PANEL
ANION GAP: 10 (ref 5–15)
BUN: 10 mg/dL (ref 6–20)
CALCIUM: 9.3 mg/dL (ref 8.9–10.3)
CO2: 23 mmol/L (ref 22–32)
Chloride: 112 mmol/L — ABNORMAL HIGH (ref 101–111)
Creatinine, Ser: 0.87 mg/dL (ref 0.44–1.00)
Glucose, Bld: 95 mg/dL (ref 65–99)
Potassium: 4.2 mmol/L (ref 3.5–5.1)
Sodium: 145 mmol/L (ref 135–145)

## 2016-11-03 LAB — CULTURE, BLOOD (ROUTINE X 2)
CULTURE: NO GROWTH
Culture: NO GROWTH

## 2016-11-03 LAB — MAGNESIUM: MAGNESIUM: 1.9 mg/dL (ref 1.7–2.4)

## 2016-11-03 LAB — GLUCOSE, CAPILLARY
GLUCOSE-CAPILLARY: 116 mg/dL — AB (ref 65–99)
Glucose-Capillary: 94 mg/dL (ref 65–99)

## 2016-11-03 MED ORDER — FUROSEMIDE 40 MG PO TABS: 40.0000 mg | ORAL_TABLET | Freq: Every day | ORAL | 1 refills | Status: DC

## 2016-11-03 MED ORDER — FUROSEMIDE 40 MG PO TABS: 40.0000 mg | ORAL_TABLET | Freq: Every day | ORAL | Status: DC

## 2016-11-03 MED ORDER — SPIRONOLACTONE 25 MG PO TABS: 25.0000 mg | ORAL_TABLET | Freq: Every day | ORAL | 1 refills | Status: DC

## 2016-11-03 MED ORDER — LOSARTAN POTASSIUM 50 MG PO TABS: 50.0000 mg | ORAL_TABLET | Freq: Every day | ORAL | 1 refills | Status: DC

## 2016-11-03 MED ORDER — METHIMAZOLE 5 MG PO TABS: 15.0000 mg | ORAL_TABLET | Freq: Three times a day (TID) | ORAL | 1 refills | Status: DC

## 2016-11-03 MED ORDER — LABETALOL HCL 300 MG PO TABS: 300.0000 mg | ORAL_TABLET | Freq: Two times a day (BID) | ORAL | 0 refills | Status: DC

## 2016-11-03 MED ORDER — SPIRONOLACTONE 25 MG PO TABS: 25.0000 mg | ORAL_TABLET | Freq: Every day | ORAL | Status: DC

## 2016-11-03 NOTE — Progress Notes (Addendum)
Patient ID: Karel Jarvisieshia N Srinivasan, female   DOB: [birth year], 25 y.o.   MRN: [id number]     Advanced Heart Failure Rounding Note  Referring Physician: Dr. Christene Slatese Dios Primary Physician: None per patient.  Primary Cardiologist:  None  Reason for Consultation: ? Post-partum cardiomyopathy vs Diastolic HF  Subjective:    Echo 10/26/16 LVEF 60-65%, Mild AS, mild LAE, Mild TI, PA peak pressure 54 mm Hg. Normal RV  Weight down 28 pounds. Off all diuretics and antibiotics. Yesterday she was started on spiro and losartan.   Denies SOB. Wants to go home.    Objective:   Weight Range: 241 lb 6.5 oz (109.5 kg) Body mass index is 38.96 kg/m.   Vital Signs:   Temp:  [98 F (36.7 C)-98.7 F (37.1 C)] 98 F (36.7 C) (11/15 0601) Pulse Rate:  [82-96] 96 (11/15 0601) Resp:  [20-24] 20 (11/15 0601) BP: (126-151)/(67-84) 129/69 (11/15 0601) SpO2:  [96 %-98 %] 98 % (11/15 0601) Last BM Date: 11/01/16  Weight change: Filed Weights   10/29/16 0400 10/30/16 0345 10/31/16 0455  Weight: 255 lb 1.2 oz (115.7 kg) 240 lb 4.8 oz (109 kg) 241 lb 6.5 oz (109.5 kg)    Intake/Output:   Intake/Output Summary (Last 24 hours) at 11/03/16 1020 Last data filed at 11/03/16 0602  Gross per 24 hour  Intake              840 ml  Output             1325 ml  Net             -485 ml     Physical Exam: General: NAD. In bed.  HEENT: Normal . + exopthalmos.  Neck: supple. JVP flat  cm. No lymphadenopathy noted. Thyroid diffusely enlarged.  Cor: PMI nondisplaced. Mildly tachy, regular. No murmurs appreciated. No S3/S4 Lungs: Clear. On room air.   Abdomen: Obese, soft, NT, ND, no HSM. No bruits or masses. +BS  Extremities: no cyanosis, clubbing, rash. No edema   Neuro: alert and oriented x 3  No focal abnormalities   Telemetry: Reviewed, NSR in 90s  Labs: CBC  Recent Labs  11/01/16 0300  WBC 9.5  HGB 10.7*  HCT 35.8*  MCV 72.2*  PLT 307   Basic Metabolic Panel  Recent Labs  11/02/16 0158   11/02/16 1427 11/03/16 0533  NA 145  --   --  145  K 3.1*  --  3.4* 4.2  CL 108  --   --  112*  CO2 28  --   --  23  GLUCOSE 112*  --   --  95  BUN 16  --   --  10  CREATININE 0.79  --   --  0.87  CALCIUM 8.7*  --   --  9.3  MG  --   < > 2.0 1.9  < > = values in this interval not displayed. Liver Function Tests No results for input(s): AST, ALT, ALKPHOS, BILITOT, PROT, ALBUMIN in the last 72 hours. No results for input(s): LIPASE, AMYLASE in the last 72 hours. Cardiac Enzymes No results for input(s): CKTOTAL, CKMB, CKMBINDEX, TROPONINI in the last 72 hours.  BNP: BNP (last 3 results)  Recent Labs  08/29/16 1018 10/24/16 1403 10/26/16 0624  BNP 15.2 205.8* 227.6*    ProBNP (last 3 results) No results for input(s): PROBNP in the last 8760 hours.   D-Dimer No results for input(s): DDIMER in the last 72 hours.  Hemoglobin A1C No results for input(s): HGBA1C in the last 72 hours. Fasting Lipid Panel No results for input(s): CHOL, HDL, LDLCALC, TRIG, CHOLHDL, LDLDIRECT in the last 72 hours. Thyroid Function Tests No results for input(s): TSH, T4TOTAL, T3FREE, THYROIDAB in the last 72 hours.  Invalid input(s): FREET3  Other results:     Imaging/Studies:  No results found.  Latest Echo  Latest Cath   Medications:     Scheduled Medications: . enoxaparin (LOVENOX) injection  40 mg Subcutaneous Q24H  . feeding supplement (ENSURE ENLIVE)  237 mL Oral Q24H  . labetalol  300 mg Oral BID  . losartan  50 mg Oral Daily  . methimazole  15 mg Oral TID  . sodium chloride flush  10-40 mL Intracatheter Q12H  . spironolactone  12.5 mg Oral Daily    Infusions: . dextrose 50 mL/hr at 11/02/16 1000    PRN Medications: acetaminophen, artificial tears, guaiFENesin, sodium chloride flush   Assessment/Plan   Tara [name] is a 25 y.o. female with history of graves diease, HTN, s/p C-section 07/2016 complicated by post-partum HCAP, pregnancy complicated by  pre-eclampsia. Treated for CAP in ED 10/24/16 and sent home.  Returned to ED and admitted 10/25/16 with acute respiratory failure with hypoxia.  Intubated early morning 10/27/16 with pulmonary edema and anxiety.  1. Acute respiratory failure s/p intubation 10/27/16: Suspect PNA with likely pulmonary edema component (?ARDS).  Blood cultures NGTD.  Afebrile today.  - Extubated 10/31/16. - On room air.   - Completed antibiotics.  - much improved. Volume status looks good.  2. Acute on chronic diastolic CHF: Echo 10/26/16 LVEF 60-65%, Mild AS, mild LAE, Mild TI, PA peak pressure 54 mmHg, normal RV.  ? Etiology. Could be do to poorly controlled hyperthyroidism vs undiagnosed OSA/OHS with body habitus vs poorly controlled HTN  Volume status ok. No diuretics for now. Start lasix 40 mg daily on Friday. Overall weight down 28 pounds.  3. Graves disease: Methimazole/labetalol, eventually will need definitive treatment.  4. Severe anxiety 5. ARF: Resolved. Renal following.  6. Hypernatremia: Sodium 145.   7. Hypokalemia-Stable today. K 4.2    8. Deconditioning - consult PT.  9. HTN- now well controlled.   HF follow up scheduled. Plan to check BMET at that time.  HF Meds Spiro 25 mg daily Losartan 50 mg daily  Lasix 40 mg start 11/05/16   Length of Stay: 9  Amy Clegg NP-C  11/03/2016, 10:20 AM  Advanced Heart Failure Team Pager [phone] (M-F; 7a - 4p)  Please contact [facility] for night-coverage after hours (4p -7a ) and weekends on [email or web address]  Patient seen and examined with Tonye Becket, NP. We discussed all aspects of the encounter. I agree with the assessment and plan as stated above.   She is improved. Eager to go home. Volume status and renal function ok. Ok for d/c on above meds. Will see back in HF Clinic next week. D/w Dr. Rito Ehrlich.  Dori Devino,MD 2:22 PM

## 2016-11-03 NOTE — Discharge Summary (Signed)
[facility]  Physician Discharge Summary   Patient ID: Tara [name] MRN: [id number] DOB/AGE: [birth year] 25 y.o.  Admit date: 10/25/2016 Discharge date: 11/03/2016  PCP: No PCP Per Patient  DISCHARGE DIAGNOSES:  Principal Problem:   CAP (community acquired pneumonia) Active Problems:   Graves disease   Acute respiratory failure with hypoxia (HCC)   Sepsis (HCC)   Acute respiratory failure (HCC)   Sepsis due to pneumonia (HCC)   Dilated cardiomyopathy (HCC)   Pulmonary hypertension   Pressure injury of skin   ARDS (adult respiratory distress syndrome) (HCC)   Acute on chronic diastolic CHF (congestive heart failure) (HCC)   RECOMMENDATIONS FOR OUTPATIENT FOLLOW UP: 1. Patient will have close outpatient follow-up with the heart failure clinic   DISCHARGE CONDITION: fair  Diet recommendation: Heart healthy  Filed Weights   10/29/16 0400 10/30/16 0345 10/31/16 0455  Weight: 115.7 kg (255 lb 1.2 oz) 109 kg (240 lb 4.8 oz) 109.5 kg (241 lb 6.5 oz)    INITIAL HISTORY:  25 y.o.BF PMHx Graves' disease, HTN, Myocardial infarction,Chronic bronchitis , and recent Cesarean delivery complicated by post-partum HCAP (August) , presented to the ED for evaluation of fever, cough, and shortness of breath. Chest xray showed bilateral airspace opacities, favoring pneumonia. She was hypoxic to 85% upon arrival. Patient was thought to have a combination of pneumonia as well as heart failure. She had to be intubated. She was in the intensive care unit.  Consultations: Critical care medicine. Heart failure team  Procedures: 11/6 Echocardiogram:Left ventricle: mild LVH. -LVEF 60% to 65%. (Improved from 2/22 when EF was 40%)- Left atrium: mildly dilated. - Tricuspid valve: There was mild regurgitation. - Pulmonary arteries: PA peak pressure: 54 mm Hg (S).  Antimicrobials: Azithromycin 11/6>> 11/ 12 Ceftriaxone 11/6>> 11/8 Vanc 11/7>>>11/8 Zosyn 11/7>>>11/13  HOSPITAL  COURSE:   Sepsis pneumonia/ CAP/ Acute Respiratory Failure with Hypoxia  CTA chest consistent with pneumonia, pulmonary edema, and ARDS. Patient was intubated. She was in the in's care unit. She slowly started improving. Blood cultures/urine legionella/urine strep antigen negative. Respiratory virus panel negative. HIV normal in July. Respiratory failure resolved. He is saturating normal on room air. She's completed a course of antibiotics.  Cardiomyopathy/Severe Pulmonary Hypertension/Acute on Chronic Diastolic CHF Patient was seen by the heart failure team. Presentation thought to be secondary to hyperthyroid state. She was initially placed on diuretics. Weight has decreased. Echocardiogram report as above. She is now on spironolactone with plans to initiate Lasix in 2 days time. Heart failure team will follow her in the clinic in the next few days. Patient has been ambulating and does get dyspneic but symptoms have improved. Patient is on ARB and beta blocker.  Graves' disease,  Undetectable TSH consistent with hyperthyroid state. Free T4 remains elevated. Her dose of methimazole was last increased in September to 15 mg BID. This was further increased to 15 mg 3 times a day during this admission. She will need close outpatient monitoring of her thyroid function tests. She may benefit also by being seen by an endocrinologist, however, would first need to establish with primary care physician. Attempts are underway to help her to be seen at the [facility].   Anxiety  Resolved. Not on any medications at this time. Patient requesting anxiety medicines. However, considering that she has a newborn, would hesitate prescribing new medications, which has a potential for making the patient is drowsy and sleepy and prone to injury and other adverse effects.  Hypokalemia Resolved  Hypernatremia Resolved  Overall improved. Patient wants to go home today. Discussed with Dr.  [name] with cardiology. Okay for discharge.   PERTINENT LABS:  The results of significant diagnostics from this hospitalization (including imaging, microbiology, ancillary and laboratory) are listed below for reference.    Microbiology: Recent Results (from the past 240 hour(s))  Blood Culture (routine x 2)     Status: None   Collection Time: 10/25/16  6:51 PM  Result Value Ref Range Status   Specimen Description BLOOD RIGHT ANTECUBITAL  Final   Special Requests BOTTLES DRAWN AEROBIC AND ANAEROBIC  Final   Culture NO GROWTH 5 DAYS  Final   Report Status 10/30/2016 FINAL  Final  Urine culture     Status: None   Collection Time: 10/25/16  6:57 PM  Result Value Ref Range Status   Specimen Description URINE, RANDOM  Final   Special Requests NONE  Final   Culture NO GROWTH  Final   Report Status 10/27/2016 FINAL  Final  Blood Culture (routine x 2)     Status: None   Collection Time: 10/25/16  7:32 PM  Result Value Ref Range Status   Specimen Description BLOOD LEFT ARM  Final   Special Requests BOTTLES DRAWN AEROBIC AND ANAEROBIC 5CC  Final   Culture NO GROWTH 5 DAYS  Final   Report Status 10/30/2016 FINAL  Final  Respiratory Panel by PCR     Status: None   Collection Time: 10/25/16 11:06 PM  Result Value Ref Range Status   Adenovirus NOT DETECTED NOT DETECTED Final   Coronavirus 229E NOT DETECTED NOT DETECTED Final   Coronavirus HKU1 NOT DETECTED NOT DETECTED Final   Coronavirus NL63 NOT DETECTED NOT DETECTED Final   Coronavirus OC43 NOT DETECTED NOT DETECTED Final   Metapneumovirus NOT DETECTED NOT DETECTED Final   Rhinovirus / Enterovirus NOT DETECTED NOT DETECTED Final   Influenza A NOT DETECTED NOT DETECTED Final   Influenza B NOT DETECTED NOT DETECTED Final   Parainfluenza Virus 1 NOT DETECTED NOT DETECTED Final   Parainfluenza Virus 2 NOT DETECTED NOT DETECTED Final   Parainfluenza Virus 3 NOT DETECTED NOT DETECTED Final   Parainfluenza Virus 4 NOT DETECTED NOT  DETECTED Final   Respiratory Syncytial Virus NOT DETECTED NOT DETECTED Final   Bordetella pertussis NOT DETECTED NOT DETECTED Final   Chlamydophila pneumoniae NOT DETECTED NOT DETECTED Final   Mycoplasma pneumoniae NOT DETECTED NOT DETECTED Final  MRSA PCR Screening     Status: Abnormal   Collection Time: 10/26/16  5:11 AM  Result Value Ref Range Status   MRSA by PCR POSITIVE (A) NEGATIVE Final    Comment:        The GeneXpert MRSA Assay (FDA approved for NASAL specimens only), is one component of a comprehensive MRSA colonization surveillance program. It is not intended to diagnose MRSA infection nor to guide or monitor treatment for MRSA infections. RESULT CALLED TO, READ BACK BY AND VERIFIED WITH: M HOPPER,RN  10/26/16 MKELLY,MLT   Culture, blood (routine x 2) Call MD if unable to obtain prior to antibiotics being given     Status: None   Collection Time: 10/26/16  6:07 AM  Result Value Ref Range Status   Specimen Description BLOOD LEFT ARM  Final   Special Requests IN PEDIATRIC BOTTLE  Final   Culture NO GROWTH 5 DAYS  Final   Report Status 10/31/2016 FINAL  Final  Culture, blood (routine x 2) Call MD if unable to obtain  prior to antibiotics being given     Status: None   Collection Time: 10/26/16  6:12 AM  Result Value Ref Range Status   Specimen Description BLOOD LEFT HAND  Final   Special Requests IN PEDIATRIC BOTTLE  Final   Culture NO GROWTH 5 DAYS  Final   Report Status 10/31/2016 FINAL  Final  Culture, respiratory (NON-Expectorated)     Status: None   Collection Time: 10/27/16  3:17 AM  Result Value Ref Range Status   Specimen Description TRACHEAL ASPIRATE  Final   Special Requests Normal  Final   Gram Stain   Final    MODERATE WBC PRESENT,BOTH PMN AND MONONUCLEAR RARE GRAM POSITIVE COCCI IN PAIRS    Culture Consistent with normal respiratory flora.  Final   Report Status 10/29/2016 FINAL  Final  Culture, blood (Routine X 2) w Reflex to ID Panel      Status: None (Preliminary result)   Collection Time: 10/29/16  1:25 AM  Result Value Ref Range Status   Specimen Description BLOOD RIGHT HAND  Final   Special Requests AEROBIC BOTTLE ONLY  Final   Culture NO GROWTH 4 DAYS  Final   Report Status PENDING  Incomplete  Culture, blood (Routine X 2) w Reflex to ID Panel     Status: None (Preliminary result)   Collection Time: 10/29/16  1:30 AM  Result Value Ref Range Status   Specimen Description BLOOD RIGHT HAND  Final   Special Requests IN PEDIATRIC BOTTLE  Final   Culture NO GROWTH 4 DAYS  Final   Report Status PENDING  Incomplete  C difficile quick scan w PCR reflex     Status: None   Collection Time: 11/01/16  6:00 PM  Result Value Ref Range Status   C Diff antigen NEGATIVE NEGATIVE Final   C Diff toxin NEGATIVE NEGATIVE Final   C Diff interpretation No C. difficile detected.  Final     Labs: Basic Metabolic Panel:  Recent Labs Lab 10/28/16 0510  10/28/16 1700 10/29/16 0223 10/30/16 0355 10/31/16 0300 11/01/16 0300 11/01/16 2055 11/02/16 0158 11/02/16 0900 11/02/16 1427 11/03/16 0533  NA 137  < >  --  146* 152* 155* 153* 143 145  --   --  145  K 5.8*  < >  --  3.9 3.3* 3.7 2.8* 3.1* 3.1*  --  3.4* 4.2  CL 103  < >  --  109 108 111 113* 105 108  --   --  112*  CO2 25  < >  --  28 36* 35* 30 28 28   --   --  23  GLUCOSE 101*  < >  --  116* 131* 127* 118* 115* 112*  --   --  95  BUN 49*  < >  --  51* 38* 40* 29* 20 16  --   --  10  CREATININE 2.05*  < >  --  1.50* 0.85 0.92 0.85 0.82 0.79  --   --  0.87  CALCIUM 8.3*  < >  --  9.0 9.6 9.5 9.1 8.9 8.7*  --   --  9.3  MG 2.5*  --  2.8* 3.0* 2.8* 2.6*  --   --   --  2.1 2.0 1.9  PHOS 9.1*  --  5.2* 5.2* 3.3 4.9*  --   --   --   --   --   --   < > = values in this interval  not displayed.  CBC:  Recent Labs Lab 10/28/16 0510 10/29/16 0223 10/30/16 0355 10/31/16 0300 11/01/16 0300  WBC 13.3* 10.4 11.1* 7.4 9.5  HGB 11.4* 10.6* 10.7* 11.0* 10.7*  HCT 39.4  35.3* 36.7 37.1 35.8*  MCV 76.4* 72.2* 73.3* 73.8* 72.2*  PLT 296 259 310 286 307   BNP: BNP (last 3 results)  Recent Labs  08/29/16 1018 10/24/16 1403 10/26/16 0624  BNP 15.2 205.8* 227.6*    CBG:  Recent Labs Lab 11/02/16 1222 11/02/16 1653 11/02/16 2030 11/03/16 0749 11/03/16 1142  GLUCAP 150* 104* 112* 94 116*     IMAGING STUDIES Dg Chest 2 View  Result Date: 10/24/2016 CLINICAL DATA:  Chest pain. Dry cough. Short of breath since yesterday. Morbid obesity. EXAM: CHEST  2 VIEW COMPARISON:  08/29/2016 FINDINGS: Midline trachea. Cardiomegaly, accentuated by a mildly low lung volumes on the frontal radiograph. No pleural effusion or pneumothorax. Right greater than left, lower lobe predominant airspace disease. IMPRESSION: Bilateral airspace opacities, favoring pneumonia. Pulmonary edema felt less likely, given absence of pleural fluid. Cardiomegaly. Electronically Signed   By: Jeronimo Greaves M.D.   On: 10/24/2016 13:54   Ct Angio Chest Pe W And/or Wo Contrast  Result Date: 10/25/2016 CLINICAL DATA:  Increasing shortness of breath for 3 days. Current treatment for pneumonia. EXAM: CT ANGIOGRAPHY CHEST WITH CONTRAST TECHNIQUE: Multidetector CT imaging of the chest was performed using the standard protocol during bolus administration of intravenous contrast. Multiplanar CT image reconstructions and MIPs were obtained to evaluate the vascular anatomy. CONTRAST:  100 mL Isovue 370 COMPARISON:  04/13/2016 FINDINGS: Cardiovascular: Satisfactory opacification of the pulmonary arteries to the segmental level. No evidence of pulmonary embolism. Cardiac enlargement. Reflux of contrast material into the hepatic veins suggesting right heart failure. No pericardial effusion. Mediastinum/Nodes: No significant lymphadenopathy in the chest. Esophagus is decompressed. Diffuse enlargement of the thyroid gland without focal nodularity suggesting thyroid goiter. Lungs/Pleura: Diffuse airspace  consolidation throughout both lungs. This is increasing since previous study. This could be due to multifocal pneumonia, edema, or ARDS. Upper Abdomen: No acute abnormality. Musculoskeletal: No chest wall abnormality. No acute or significant osseous findings. Review of the MIP images confirms the above findings. IMPRESSION: No evidence of significant pulmonary embolus. Diffuse airspace consolidation in both lungs may indicate edema, pneumonia, or ARDS. Cardiac enlargement with evidence of right heart failure. Electronically Signed   By: Burman Nieves M.D.   On: 10/25/2016 23:52   Dg Chest Port 1 View  Result Date: 11/01/2016 CLINICAL DATA:  Acute hypoxemia, respiratory failure, sepsis and community-acquired pneumonia. EXAM: PORTABLE CHEST 1 VIEW COMPARISON:  Portable chest x-ray of October 31, 2016 FINDINGS: The trachea and esophagus have been extubated. The lungs remain hypoinflated. The interstitial markings are coarse especially on the right and in the retrocardiac region on the left. The cardiac silhouette is enlarged. The pulmonary vascularity is prominent centrally but less congested overall than on the previous study. The mediastinum is normal in width. The left internal jugular venous catheter tip projects over the proximal SVC. IMPRESSION: Improved appearance of the pulmonary interstitium suggesting decreasing interstitial edema or pneumonia. Persistent hypo inflation following extubation. Stable cardiomegaly with central pulmonary vascularity. Electronically Signed   By: David  [place] M.D.   On: 11/01/2016 07:02   Dg Chest Port 1 View  Result Date: 10/31/2016 CLINICAL DATA:  Acute hypoxemic respiratory failure EXAM: PORTABLE CHEST 1 VIEW COMPARISON:  10/30/2016 FINDINGS: Endotracheal tube tip is 2 cm above the carina. Nasogastric tube enters the stomach  left internal jugular central line has its tip in the SVC just above the right atrium. Bilateral airspace filling has worsened, most likely  worsen pneumonia. Some of this may relate to ventilatory timing. I think there is worsened volume loss in the left lower lobe IMPRESSION: Lines and tubes well positioned. Worsened pneumonia and left lower lobe volume loss. Electronically Signed   By: Paulina Fusi M.D.   On: 10/31/2016 06:39   Dg Chest Port 1 View  Result Date: 10/30/2016 CLINICAL DATA:  Evaluate ETT. EXAM: PORTABLE CHEST 1 VIEW COMPARISON:  October 29, 2016 FINDINGS: The ETT is in good position as is a left central line. The NG tube has been pulled back i an n the side port is now at the level of the cervical esophagus. No pneumothorax. Right greater than left pulmonary opacities are stable. No other interval changes. IMPRESSION: 1. The NG tube side port is now at the level of the cervical esophagus, pulled back in the interval. Recommend repositioning. Other support apparatus is stable. 2. Stable pulmonary opacities. These results will be called to the ordering clinician or representative by the Radiologist Assistant, and communication documented in the PACS or zVision Dashboard. Electronically Signed   By: Gerome Sam III M.D   On: 10/30/2016 07:26   Dg Chest Port 1 View  Result Date: 10/29/2016 CLINICAL DATA:  Endotracheal tube.  Shortness of breath. EXAM: PORTABLE CHEST 1 VIEW COMPARISON:  10/28/2016 FINDINGS: Endotracheal tube remains in place with tip 2.5 cm above the carina. The left jugular venous catheter remains in place with tip overlying the SVC. Enteric tube courses into the left upper abdomen with tip not imaged. The cardiomediastinal silhouette is unchanged. Perihilar predominant bilateral airspace opacities have minimally improved. No sizable pleural effusion or pneumothorax is identified. IMPRESSION: Slight further improvement of bilateral airspace disease. Electronically Signed   By: Sebastian Ache M.D.   On: 10/29/2016 07:29   Dg Chest Port 1 View  Result Date: 10/28/2016 CLINICAL DATA:  Hypoxia.  Central catheter  placement EXAM: PORTABLE CHEST 1 VIEW COMPARISON:  Study obtained earlier in the day FINDINGS: Central catheter tip is in the superior vena cava. Endotracheal tube tip is 3.1 cm above the carina. Nasogastric tube tip and side port are below the diaphragm. No pneumothorax. There is patchy interstitial and alveolar edema, essentially stable. There is cardiomegaly with pulmonary venous hypertension. No adenopathy evident. IMPRESSION: Tube and catheter positions as described without pneumothorax. Persistent interstitial and patchy alveolar opacity, felt to be consistent with edema/in chest of heart failure. Stable cardiac silhouette. Should be noted that pneumonia superimposed on edema cannot be excluded radiographically. Electronically Signed   By: Bretta Bang III M.D.   On: 10/28/2016 14:21   Dg Chest Port 1 View  Result Date: 10/28/2016 CLINICAL DATA:  Respiratory difficulty EXAM: PORTABLE CHEST 1 VIEW COMPARISON:  10/27/2016 FINDINGS: Endotracheal and NG tubes are again noted. They are stable. Diffuse bilateral airspace disease with a central prep collection is stable. No pneumothorax. The heart remains enlarged. No definite pleural effusion. IMPRESSION: Improving bilateral diffuse airspace disease. Electronically Signed   By: Jolaine Click M.D.   On: 10/28/2016 07:51   Dg Chest Port 1 View  Result Date: 10/27/2016 CLINICAL DATA:  Respiratory failure.  Endotracheal tube placement. EXAM: PORTABLE CHEST 1 VIEW COMPARISON:  10/27/2016 FINDINGS: An endotracheal tube is been placed. Tip measures 4 mm above the carina. This measurement may be artifactually reduced due to shallow inspiration and AP technique. Shallow inspiration.  Bilateral perihilar airspace consolidation is increasing since previous study. Cardiac enlargement. No pneumothorax. Enteric tube tip is off the field of view but below the left hemidiaphragm. IMPRESSION: Endotracheal tube tip is low, measuring only about 4 mm above the carina.  Increasing perihilar consolidation in the lungs bilaterally. Electronically Signed   By: Burman NievesWilliam  Stevens M.D.   On: 10/27/2016 04:13   Dg Chest Port 1 View  Result Date: 10/27/2016 CLINICAL DATA:  Respiratory distress tonight EXAM: PORTABLE CHEST 1 VIEW COMPARISON:  10/26/2016 FINDINGS: Shallow inspiration. Cardiac enlargement. Bilateral perihilar airspace disease, greater on the right. Changes suggest pneumonia. Similar appearance to previous study. No pleural effusions. No pneumothorax. Mediastinal contours appear intact. IMPRESSION: Bilateral perihilar infiltrates, greater on the right, suggesting pneumonia. Similar appearance to previous study. Electronically Signed   By: Burman NievesWilliam  Stevens M.D.   On: 10/27/2016 01:50   Dg Chest Port 1 View  Result Date: 10/26/2016 CLINICAL DATA:  Shortness of breath and chest pain for 2 days. EXAM: PORTABLE CHEST 1 VIEW COMPARISON:  10/25/2016 FINDINGS: The cardiac silhouette remains mildly enlarged. The lungs remain hypoinflated. There is increasing airspace consolidation in the right perihilar region. Patchy left lung opacities have not significantly changed. No sizable pleural effusion or pneumothorax is identified. No acute osseous abnormality is seen. IMPRESSION: Bilateral airspace opacities with interval worsening on the right, concerning for pneumonia. Electronically Signed   By: Sebastian AcheAllen  Grady M.D.   On: 10/26/2016 19:42   Dg Chest Port 1 View  Result Date: 10/25/2016 CLINICAL DATA:  Dyspnea. Diagnosed with pneumonia yesterday. Rapid shallow breathing. EXAM: PORTABLE CHEST 1 VIEW COMPARISON:  10/24/2016 FINDINGS: Lung volumes are low with relative increase in patchy bilateral airspace opacities more so at the lung bases consistent with pneumonic consolidations. No definite cephalization of pulmonary vessels to suggest CHF. Heart is enlarged but stable. No aortic aneurysm. Engorged appearing azygos vein may reflect increase in the patient's hydration status. No  pleural effusion or pneumothorax. No suspicious osseous lesions. IMPRESSION: Patchy bilateral airspace opacities are again noted consistent pneumonic infiltrates more so at the lung bases. These appear slightly more confluent than on prior exam. Electronically Signed   By: Tollie Ethavid  Kwon M.D.   On: 10/25/2016 19:43   Dg Abd Portable 1v  Result Date: 10/30/2016 CLINICAL DATA:  Cardiomyopathy, pulmonary edema and respiratory failure. Status post nasogastric tube placement. EXAM: PORTABLE ABDOMEN - 1 VIEW COMPARISON:  10/27/2016 FINDINGS: Gastric decompression tube visualized with the tip in the distal body of the stomach. Bowel gas pattern is normal without evidence of obstruction or ileus. No abnormal calcifications identified. Bony and soft tissue structures are unremarkable. IMPRESSION: Gastric decompression tube extends into the body of the stomach. Unremarkable bowel gas pattern. Electronically Signed   By: Irish LackGlenn  Yamagata M.D.   On: 10/30/2016 09:17   Dg Abd Portable 1v  Result Date: 10/27/2016 CLINICAL DATA:  NG tube placement EXAM: PORTABLE ABDOMEN - 1 VIEW COMPARISON:  None. FINDINGS: Enteric tube tip is in the left upper quadrant consistent with location in the upper stomach. Scattered gas and stool in the colon without distention. IMPRESSION: Enteric tube tip is in the left upper quadrant consistent with location in the upper stomach. Electronically Signed   By: Burman NievesWilliam  Stevens M.D.   On: 10/27/2016 04:10    DISCHARGE EXAMINATION: Vitals:   11/02/16 1900 11/02/16 2032 11/03/16 0601 11/03/16 1000  BP: 135/67 126/73 129/69 134/72  Pulse: 90 82 96 88  Resp: 20 20 20 20   Temp: 98.7 F (37.1  C) 98.4 F (36.9 C) 98 F (36.7 C) 98.6 F (37 C)  TempSrc: Oral Oral Oral Oral  SpO2: 98% 96% 98% 98%  Weight:      Height:       General appearance: alert, cooperative, appears stated age and no distress Resp: Good air entry bilaterally. No definite crackles or wheezing. Cardio: regular rate and  rhythm, S1, S2 normal, no murmur, click, rub or gallop GI: soft, non-tender; bowel sounds normal; no masses,  no organomegaly Extremities: Improving edema bilateral lower extremities   DISPOSITION: Home  Discharge Instructions    (HEART FAILURE PATIENTS) Call MD:  Anytime you have any of the following symptoms: 1) 3 pound weight gain in 24 hours or 5 pounds in 1 week 2) shortness of breath, with or without a dry hacking cough 3) swelling in the hands, feet or stomach 4) if you have to sleep on extra pillows at night in order to breathe.    Complete by:  As directed    Call MD for:  difficulty breathing, headache or visual disturbances    Complete by:  As directed    Call MD for:  extreme fatigue    Complete by:  As directed    Call MD for:  persistant dizziness or light-headedness    Complete by:  As directed    Call MD for:  persistant nausea and vomiting    Complete by:  As directed    Call MD for:  severe uncontrolled pain    Complete by:  As directed    Call MD for:  temperature >100.4    Complete by:  As directed    Diet - low sodium heart healthy    Complete by:  As directed    Discharge instructions    Complete by:  As directed    Please take your medications as prescribed. Please keep your follow-up appointments.  You were cared for by a hospitalist during your hospital stay. If you have any questions about your discharge medications or the care you received while you were in the hospital after you are discharged, you can call the unit and asked to speak with the hospitalist on call if the hospitalist that took care of you is not available. Once you are discharged, your primary care physician will handle any further medical issues. Please note that NO REFILLS for any discharge medications will be authorized once you are discharged, as it is imperative that you return to your primary care physician (or establish a relationship with a primary care physician if you do not have one)  for your aftercare needs so that they can reassess your need for medications and monitor your lab values. If you do not have a primary care physician, you can call [phone] for a physician referral.   Increase activity slowly    Complete by:  As directed       ALLERGIES:  Allergies  Allergen Reactions  . Iodine Hives  . Shellfish Allergy Other (See Comments)     Current Discharge Medication List    START taking these medications   Details  furosemide (LASIX) 40 MG tablet Take 1 tablet (40 mg total) by mouth daily. Starting Friday 11/05/16 Qty: 30 tablet, Refills: 1    losartan (COZAAR) 50 MG tablet Take 1 tablet (50 mg total) by mouth daily. Qty: 30 tablet, Refills: 1    spironolactone (ALDACTONE) 25 MG tablet Take 1 tablet (25 mg total) by mouth daily. Qty: 30 tablet, Refills:  1      CONTINUE these medications which have CHANGED   Details  labetalol (NORMODYNE) 300 MG tablet Take 1 tablet (300 mg total) by mouth 2 (two) times daily. Qty: 60 tablet, Refills: 0    methimazole (TAPAZOLE) 5 MG tablet Take 3 tablets (15 mg total) by mouth 3 (three) times daily. Qty: 90 tablet, Refills: 1      STOP taking these medications     azithromycin (ZITHROMAX) 250 MG tablet      hydrochlorothiazide (HYDRODIURIL) 25 MG tablet         Follow-up Information    [facility]. Schedule an appointment as soon as possible for a visit in 2 day(s).   Why:  Schedule establish care in 1-2 weeks for cardiomyopathy/acute on chronic diastolic CHF. Graves' disease uncontrolled (will need referral to endocrinologist)  Contact information: [address] [place] [address] [phone]       Tonye Becket, NP Follow up on 11/08/2016.   Specialty:  Cardiology Why:  at 9:40 Heart Failure Clinic is located on the 1sr floor of [facility]  Contact information: [address] [place] [address] [phone]           TOTAL DISCHARGE  TIME: 35 minutes  [facility]  [facility] Pager [phone]  11/03/2016, 2:29 PM

## 2016-11-03 NOTE — Discharge Instructions (Signed)
Heart Failure  Heart failure means your heart has trouble pumping blood. This makes it hard for your body to work well. Heart failure is usually a long-term (chronic) condition. You must take good care of yourself and follow your doctor's treatment plan.  HOME CARE   Take your heart medicine as told by your doctor.    Do not stop taking medicine unless your doctor tells you to.    Do not skip any dose of medicine.    Refill your medicines before they run out.    Take other medicines only as told by your doctor or pharmacist.   Stay active if told by your doctor. The elderly and people with severe heart failure should talk with a doctor about physical activity.   Eat heart-healthy foods. Choose foods that are without trans fat and are low in saturated fat, cholesterol, and salt (sodium). This includes fresh or frozen fruits and vegetables, fish, lean meats, fat-free or low-fat dairy foods, whole grains, and high-fiber foods. Lentils and dried peas and beans (legumes) are also good choices.   Limit salt if told by your doctor.   Cook in a healthy way. Roast, grill, broil, bake, poach, steam, or stir-fry foods.   Limit fluids as told by your doctor.   Weigh yourself every morning. Do this after you pee (urinate) and before you eat breakfast. Write down your weight to give to your doctor.   Take your blood pressure and write it down if your doctor tells you to.   Ask your doctor how to check your pulse. Check your pulse as told.   Lose weight if told by your doctor.   Stop smoking or chewing tobacco. Do not use gum or patches that help you quit without your doctor's approval.   Schedule and go to doctor visits as told.   Nonpregnant women should have no more than 1 drink a day. Men should have no more than 2 drinks a day. Talk to your doctor about drinking alcohol.   Stop illegal drug use.   Stay current with shots (immunizations).   Manage your health conditions as told by your doctor.   Learn to  manage your stress.   Rest when you are tired.   If it is really hot outside:    Avoid intense activities.    Use air conditioning or fans, or get in a cooler place.    Avoid caffeine and alcohol.    Wear loose-fitting, lightweight, and light-colored clothing.   If it is really cold outside:    Avoid intense activities.    Layer your clothing.    Wear mittens or gloves, a hat, and a scarf when going outside.    Avoid alcohol.   Learn about heart failure and get support as needed.   Get help to maintain or improve your quality of life and your ability to care for yourself as needed.  GET HELP IF:    You gain weight quickly.   You are more short of breath than usual.   You cannot do your normal activities.   You tire easily.   You cough more than normal, especially with activity.   You have any or more puffiness (swelling) in areas such as your hands, feet, ankles, or belly (abdomen).   You cannot sleep because it is hard to breathe.   You feel like your heart is beating fast (palpitations).   You get dizzy or light-headed when you stand up.  GET HELP   RIGHT AWAY IF:    You have trouble breathing.   There is a change in mental status, such as becoming less alert or not being able to focus.   You have chest pain or discomfort.   You faint.  MAKE SURE YOU:    Understand these instructions.   Will watch your condition.   Will get help right away if you are not doing well or get worse.     This information is not intended to replace advice given to you by your health care provider. Make sure you discuss any questions you have with your health care provider.     Document Released: 09/14/2008 Document Revised: 12/27/2014 Document Reviewed: 01/22/2013  Elsevier Interactive Patient Education 2017 Elsevier Inc[employer]

## 2016-11-08 ENCOUNTER — Encounter (HOSPITAL_COMMUNITY): Payer: Self-pay | Admitting: *Deleted

## 2016-11-08 ENCOUNTER — Ambulatory Visit (HOSPITAL_COMMUNITY)
Admit: 2016-11-08 | Discharge: 2016-11-08 | Disposition: A | Discharge status: Home / Self Care | Payer: Medicaid Other | Source: Ambulatory Visit | Attending: Cardiology | Admitting: Cardiology

## 2016-11-08 VITALS — BP 130/82 | HR 86 | Wt 233.1 lb

## 2016-11-08 DIAGNOSIS — Z8701 Personal history of pneumonia (recurrent): Secondary | ICD-10-CM | POA: Diagnosis not present

## 2016-11-08 DIAGNOSIS — I420 Dilated cardiomyopathy: Secondary | ICD-10-CM | POA: Diagnosis not present

## 2016-11-08 DIAGNOSIS — Z598 Other problems related to housing and economic circumstances: Secondary | ICD-10-CM | POA: Insufficient documentation

## 2016-11-08 DIAGNOSIS — Z79899 Other long term (current) drug therapy: Secondary | ICD-10-CM | POA: Diagnosis not present

## 2016-11-08 DIAGNOSIS — I110 Hypertensive heart disease with heart failure: Secondary | ICD-10-CM | POA: Diagnosis not present

## 2016-11-08 DIAGNOSIS — E0500 Thyrotoxicosis with diffuse goiter without thyrotoxic crisis or storm: Secondary | ICD-10-CM | POA: Diagnosis not present

## 2016-11-08 DIAGNOSIS — I252 Old myocardial infarction: Secondary | ICD-10-CM | POA: Diagnosis not present

## 2016-11-08 DIAGNOSIS — E669 Obesity, unspecified: Secondary | ICD-10-CM | POA: Insufficient documentation

## 2016-11-08 DIAGNOSIS — I5032 Chronic diastolic (congestive) heart failure: Secondary | ICD-10-CM | POA: Diagnosis not present

## 2016-11-08 DIAGNOSIS — I10 Essential (primary) hypertension: Secondary | ICD-10-CM | POA: Diagnosis not present

## 2016-11-08 DIAGNOSIS — F419 Anxiety disorder, unspecified: Secondary | ICD-10-CM | POA: Diagnosis not present

## 2016-11-08 DIAGNOSIS — Z6837 Body mass index (BMI) 37.0-37.9, adult: Secondary | ICD-10-CM | POA: Diagnosis not present

## 2016-11-08 LAB — BASIC METABOLIC PANEL
ANION GAP: 9 (ref 5–15)
BUN: 9 mg/dL (ref 6–20)
CO2: 26 mmol/L (ref 22–32)
Calcium: 10.1 mg/dL (ref 8.9–10.3)
Chloride: 103 mmol/L (ref 101–111)
Creatinine, Ser: 0.76 mg/dL (ref 0.44–1.00)
GFR calc Af Amer: >60 mL/min (ref >60)
GLUCOSE: 98 mg/dL (ref 65–99)
POTASSIUM: 4.3 mmol/L (ref 3.5–5.1)
Sodium: 138 mmol/L (ref 135–145)

## 2016-11-08 NOTE — Progress Notes (Signed)
PCP: None  Primary Cardiologist: Dr [name]  HPI: Tara Rushlow Powellis a 25 y.o.femalewith history of graves diease, HTN, s/p C-section 07/2016 complicated by post-partum HCAP, pregnancy complicated by pre-eclampsia.   Treated for CAP in ED 10/24/16 and sent home. Returned to ED and admitted 10/25/16 with acute respiratory failure with hypoxia. Intubated early morning 10/27/16 with pulmonary edema and anxiety. Later extubated to room air. Hospital course complicated by hypernatremia. Diuresed ~30 pounds. Discharge weight 241 pounds.   She returns for post hospital follow up. Overall feeing ok. Stressed because she doesn't have money to for lights. Denies SOB/PND/Orthopnea. She does not have money for food or transportation. Lives at the shelter wit . She does not have a scale. Says she has all medications. Plans to go work today at [employer].   SH: Lives with boyfriend and 52 month old infant son in a shelter. Does not drink or smoke   ROS: All systems negative except as listed in HPI, PMH and Problem List.  SH:  Social History   Social History  . Marital status: Single    Spouse name: N/A  . Number of children: N/A  . Years of education: N/A   Occupational History  . Not on file.   Social History Main Topics  . Smoking status: Never Smoker  . Smokeless tobacco: Never Used  . Alcohol use No  . Drug use: No  . Sexual activity: Not Currently   Other Topics Concern  . Not on file   Social History Narrative  . No narrative on file    FH: No family history on file.  Past Medical History:  Diagnosis Date  . Anemia   . CAP (community acquired pneumonia) 02/13/2016  . Chronic bronchitis (HCC)   . Graves disease   . Graves disease   . Headache   . Hypertension   . Myocardial infarction 2013  . Obesity   . Pneumonia "several times"    Current Outpatient Prescriptions  Medication Sig Dispense Refill  . furosemide (LASIX) 40 MG tablet Take 1 tablet (40 mg total) by mouth  daily. Starting Friday 11/05/16 30 tablet 1  . labetalol (NORMODYNE) 300 MG tablet Take 1 tablet (300 mg total) by mouth 2 (two) times daily. 60 tablet 0  . losartan (COZAAR) 50 MG tablet Take 1 tablet (50 mg total) by mouth daily. 30 tablet 1  . methimazole (TAPAZOLE) 5 MG tablet Take 3 tablets (15 mg total) by mouth 3 (three) times daily. 90 tablet 1  . spironolactone (ALDACTONE) 25 MG tablet Take 1 tablet (25 mg total) by mouth daily. 30 tablet 1   No current facility-administered medications for this encounter.     Vitals:   11/08/16 0945  BP: 130/82  Pulse: 86  SpO2: 98%  Weight: 233 lb 2 oz (105.7 kg)    PHYSICAL EXAM:  General:  Well appearing. No resp difficulty. Boyfriend and infant son present.  HEENT: normal Neck: supple. JVP flat. Carotids 2+ bilaterally; no bruits. No lymphadenopathy. +Thyromegaly appreciated. Cor: PMI normal. Regular rate & rhythm. No rubs, gallops or murmurs. Lungs: clear Abdomen: obese, soft, nontender, nondistended. No hepatosplenomegaly. No bruits or masses. Good bowel sounds. Extremities: no cyanosis, clubbing, rash, edema Neuro: alert & orientedx3, cranial nerves grossly intact. Moves all 4 extremities w/o difficulty. Affect pleasant.   ASSESSMENT & PLAN: Tara Lipko Powellis a 25 y.o.femalewith history of graves diease, HTN, s/p C-section 07/2016 complicated by post-partum HCAP, pregnancy complicated by pre-eclampsia. Treated for CAP in ED 10/24/16  and sent home. Returned to ED and admitted 10/25/16 with acute respiratory failure with hypoxia.   1. H./O  Aute respiratory failure s/p intubation 10/27/16: Suspect PNA with likely pulmonary edema component (?ARDS). Extubated 10/31/16. - Completed antibiotics.  2. Chronic  diastolic CHF: Echo 10/26/16 LVEF 60-65%, Mild AS, mild LAE, Mild TI, PA peak pressure 54 mmHg, normal RV.  ? Etiology. Could be do to poorly controlled hyperthyroidism vs undiagnosed OSA/OHS with body habitus vs poorly controlled  HTN,  Volume status ok. Continue lasix 40 mg daily + 25 mg spiro daily.  Weight trending down. Check BMET today.  She does not have a scale because she is living in a shelter. Will provide once she has housing.  Stressed the importance of preventing pregnancy.   3. Graves disease: Methimazole. She has follow up with Endocrinology.   4. Severe anxiety 5. H/O ARF: Check BMET today.   6. H/O Hypernatremia: Check BMET today.    7. HTN- Controlled. Stable. Continue current regimen.  8. Social- Referred to SW for assistance with transportation and food  Follow up in 1 month.  Tara Doane NP-C  10:27 AM

## 2016-11-08 NOTE — Progress Notes (Signed)
CSW referred to assist patient with community resources. Patient reports she and her boyfriend along with 78 month old baby reside in a family shelter. They are hopeful to obtain housing and just waiting on an inspection which is to take place today. Patient reports she needs to get back to work at [employer] to make some monies for their new place. Patient's boyfriend works night shift part time with [employer] and able to stay with baby during the day while patient works. Patient reports she also has a 27yo and a 27yo although they are with family members at the moment. Patient reports she needs assist with getting power turned on in new apartment once inspection clears for move in. Patient reports she is receiving assistance with housing through [employer]. CSW offered support and discussed some resources to assist with financial means for power. Patient will follow up with CSW after inspection is completed and she has a confirmed move in date. CSW continues to be available for assistance. [name], LCSW [phone]

## 2016-11-08 NOTE — Patient Instructions (Signed)
Lab today  Your physician recommends that you schedule a follow-up appointment in: 4 weeks  

## 2016-11-08 NOTE — Addendum Note (Signed)
Encounter addended by: Marcy Siren, LCSW on: 11/08/2016  2:21 PM<BR>    Actions taken: Sign clinical note

## 2016-11-15 ENCOUNTER — Telehealth: Payer: Self-pay | Admitting: Licensed Clinical Social Worker

## 2016-11-15 NOTE — Telephone Encounter (Signed)
Patient called to request help with financial assistance with deposit for [employer]. Patient reports she has a new apartment although unable to afford the deposit to have the power bill in her name. CSW assisted with resources for power bill and deposit paid. Patient very grateful for assistance. CSW available as needed. [name], LCSW [phone]

## 2016-12-06 ENCOUNTER — Encounter (HOSPITAL_COMMUNITY): Payer: Medicaid Other

## 2016-12-07 ENCOUNTER — Encounter (HOSPITAL_COMMUNITY): Payer: Self-pay

## 2016-12-07 ENCOUNTER — Ambulatory Visit (HOSPITAL_COMMUNITY)
Admission: RE | Admit: 2016-12-07 | Discharge: 2016-12-07 | Disposition: A | Discharge status: Home / Self Care | Payer: Medicaid Other | Source: Ambulatory Visit | Attending: Cardiology | Admitting: Cardiology

## 2016-12-07 VITALS — BP 122/72 | HR 84 | Wt 246.2 lb

## 2016-12-07 DIAGNOSIS — E0500 Thyrotoxicosis with diffuse goiter without thyrotoxic crisis or storm: Secondary | ICD-10-CM

## 2016-12-07 DIAGNOSIS — R079 Chest pain, unspecified: Secondary | ICD-10-CM | POA: Insufficient documentation

## 2016-12-07 DIAGNOSIS — I4510 Unspecified right bundle-branch block: Secondary | ICD-10-CM | POA: Diagnosis not present

## 2016-12-07 DIAGNOSIS — I420 Dilated cardiomyopathy: Secondary | ICD-10-CM

## 2016-12-07 DIAGNOSIS — I5032 Chronic diastolic (congestive) heart failure: Secondary | ICD-10-CM

## 2016-12-07 NOTE — Progress Notes (Signed)
PCP: None  Primary Cardiologist: Dr Gala RomneyBensimhon  HPI: Tara Cottonieshia N Powellis a 25 y.o.femalewith history of graves diease, HTN, s/p C-section 07/2016 complicated by post-partum HCAP, pregnancy complicated by pre-eclampsia.   Treated for CAP in ED 10/24/16 and sent home. Returned to ED and admitted 10/25/16 with acute respiratory failure with hypoxia. Intubated early morning 10/27/16 with pulmonary edema and anxiety. Later extubated to room air. Hospital course complicated by hypernatremia. Diuresed ~30 pounds. Discharge weight 241 pounds.   She returns for HF follow up. Mlid dyspnea with exertion but has improved overall. Denies PND/Orthopnea. Taking all medications. Weight at home going up. Works at [employer]. Says she does not have food stamps yet and has had trouble following low salt diet.  SH: Lives with boyfriend and 54 month old infant son. Does not drink or smoke   ROS: All systems negative except as listed in HPI, PMH and Problem List.  SH:  Social History   Social History  . Marital status: Single    Spouse name: N/A  . Number of children: N/A  . Years of education: N/A   Occupational History  . Not on file.   Social History Main Topics  . Smoking status: Never Smoker  . Smokeless tobacco: Never Used  . Alcohol use No  . Drug use: No  . Sexual activity: Not Currently   Other Topics Concern  . Not on file   Social History Narrative  . No narrative on file    FH: No family history on file.  Past Medical History:  Diagnosis Date  . Anemia   . CAP (community acquired pneumonia) 02/13/2016  . Chronic bronchitis (HCC)   . Graves disease   . Graves disease   . Headache   . Hypertension   . Myocardial infarction 2013  . Obesity   . Pneumonia "several times"    Current Outpatient Prescriptions  Medication Sig Dispense Refill  . furosemide (LASIX) 40 MG tablet Take 1 tablet (40 mg total) by mouth daily. Starting Friday 11/05/16 30 tablet 1  . labetalol (NORMODYNE)  300 MG tablet Take 1 tablet (300 mg total) by mouth 2 (two) times daily. 60 tablet 0  . losartan (COZAAR) 50 MG tablet Take 1 tablet (50 mg total) by mouth daily. 30 tablet 1  . methimazole (TAPAZOLE) 5 MG tablet Take 3 tablets (15 mg total) by mouth 3 (three) times daily. 90 tablet 1  . spironolactone (ALDACTONE) 25 MG tablet Take 1 tablet (25 mg total) by mouth daily. 30 tablet 1   No current facility-administered medications for this encounter.     Vitals:   12/07/16 1532  BP: 122/72  Pulse: 84  SpO2: 96%  Weight: 246 lb 4 oz (111.7 kg)    PHYSICAL EXAM:  General:  Well appearing. No resp difficulty. Boyfriend and infant son present.  HEENT: normal Neck: supple. JVP flat. Carotids 2+ bilaterally; no bruits. No lymphadenopathy. +Thyromegaly appreciated. Cor: PMI normal. Regular rate & rhythm. No rubs, gallops or murmurs. Lungs: clear Abdomen: obese, soft, nontender, nondistended. No hepatosplenomegaly. No bruits or masses. Good bowel sounds. Extremities: no cyanosis, clubbing, rash, edema Neuro: alert & orientedx3, cranial nerves grossly intact. Moves all 4 extremities w/o difficulty. Affect pleasant.  EKG:  NSR 82 bpm   ASSESSMENT & PLAN: Tara Cottonieshia N Powellis a 25 y.o.femalewith history of graves diease, HTN, s/p C-section 07/2016 complicated by post-partum HCAP, pregnancy complicated by pre-eclampsia. Treated for CAP in ED 10/24/16 and sent home. Returned to ED and admitted 10/25/16  with acute respiratory failure with hypoxia.   1. H/O  Aute respiratory failure s/p intubation 10/27/16: Suspect PNA with likely pulmonary edema component (?ARDS). Extubated 10/31/16. - Completed antibiotics.  2. Chronic  diastolic CHF: Echo 10/26/16 LVEF 60-65%, Mild AS, mild LAE, Mild TI, PA peak pressure 54 mmHg, normal RV.  ? Etiology. Could be do to poorly controlled hyperthyroidism vs undiagnosed OSA/OHS with body habitus vs poorly controlled HTN,  Volume status stable.  Continue lasix 40 mg  daily + 25 mg spiro daily.   Stressed the importance of preventing pregnancy.   3. Graves disease: Methimazole. She has follow up with Endocrinology.   4. Severe anxiety 5. H/O ARF: creatinine stable on 11/08/2016.    6. H/O Hypernatremia: sodium stable.   7. HTN- Controlled. Stable. Continue current regimen.  8. Social- Referred to SW for assistance with transportation and food  Refer to [facility].   Follow up in  3 months.  Amy Clegg NP-C  3:40 PM

## 2016-12-07 NOTE — Progress Notes (Signed)
CSW referred to assist with support and community resources. Patient reports she continues to work at [employer] but admits that she is eating more often at work than healthy eating. CSW discussed food bank resources and healthy eating. Patient states she has a pending application for food stamps and hoping for expedited transfer of case from previous county. Patient also has her two young children with her and her boyfriend along with newborn. Patient states she is having some trouble with transportation as well. Patient provided information for obtaining a PCP appointment at [facility] by receptionist.  CSW provided food bank guide and bus passes. Patient will follow up with Social services on Thursday and return call to CSW if needed. CSW will continue to follow and provide support as needed. [name], LCSW [phone]

## 2016-12-07 NOTE — Patient Instructions (Addendum)
Will refer you to Primary Care at [facility]. Address: [address] [place], [place] [address]  Phone: [phone]  Follow up 3 months with Amy Clegg NP-C.  Merry Christmas and Happy New Year!  Do the following things EVERYDAY: 1) Weigh yourself in the morning before breakfast. Write it down and keep it in a log. 2) Take your medicines as prescribed 3) Eat low salt foods-Limit salt (sodium) to 2000 mg per day.  4) Stay as active as you can everyday 5) Limit all fluids for the day to less than 2 liters

## 2017-01-27 ENCOUNTER — Emergency Department (HOSPITAL_COMMUNITY): Payer: Medicaid Other

## 2017-01-27 ENCOUNTER — Observation Stay (HOSPITAL_COMMUNITY)
Admission: EM | Admit: 2017-01-27 | Discharge: 2017-01-28 | Disposition: A | Discharge status: Home / Self Care | Payer: Medicaid Other | Attending: Family Medicine | Admitting: Family Medicine

## 2017-01-27 ENCOUNTER — Encounter (HOSPITAL_COMMUNITY): Payer: Self-pay | Admitting: Emergency Medicine

## 2017-01-27 ENCOUNTER — Other Ambulatory Visit (HOSPITAL_COMMUNITY): Payer: Self-pay

## 2017-01-27 ENCOUNTER — Other Ambulatory Visit: Payer: Self-pay

## 2017-01-27 DIAGNOSIS — R002 Palpitations: Secondary | ICD-10-CM

## 2017-01-27 DIAGNOSIS — Z8701 Personal history of pneumonia (recurrent): Secondary | ICD-10-CM | POA: Insufficient documentation

## 2017-01-27 DIAGNOSIS — I252 Old myocardial infarction: Secondary | ICD-10-CM | POA: Insufficient documentation

## 2017-01-27 DIAGNOSIS — I420 Dilated cardiomyopathy: Secondary | ICD-10-CM | POA: Insufficient documentation

## 2017-01-27 DIAGNOSIS — R262 Difficulty in walking, not elsewhere classified: Secondary | ICD-10-CM | POA: Insufficient documentation

## 2017-01-27 DIAGNOSIS — I110 Hypertensive heart disease with heart failure: Secondary | ICD-10-CM | POA: Diagnosis not present

## 2017-01-27 DIAGNOSIS — E0500 Thyrotoxicosis with diffuse goiter without thyrotoxic crisis or storm: Principal | ICD-10-CM | POA: Diagnosis present

## 2017-01-27 DIAGNOSIS — I471 Supraventricular tachycardia: Secondary | ICD-10-CM | POA: Diagnosis not present

## 2017-01-27 DIAGNOSIS — Z91013 Allergy to seafood: Secondary | ICD-10-CM | POA: Insufficient documentation

## 2017-01-27 DIAGNOSIS — Z6841 Body Mass Index (BMI) 40.0 and over, adult: Secondary | ICD-10-CM | POA: Diagnosis not present

## 2017-01-27 DIAGNOSIS — R0602 Shortness of breath: Secondary | ICD-10-CM

## 2017-01-27 DIAGNOSIS — I5032 Chronic diastolic (congestive) heart failure: Secondary | ICD-10-CM | POA: Insufficient documentation

## 2017-01-27 DIAGNOSIS — Z8679 Personal history of other diseases of the circulatory system: Secondary | ICD-10-CM

## 2017-01-27 DIAGNOSIS — R531 Weakness: Secondary | ICD-10-CM

## 2017-01-27 DIAGNOSIS — E0590 Thyrotoxicosis, unspecified without thyrotoxic crisis or storm: Secondary | ICD-10-CM | POA: Diagnosis present

## 2017-01-27 DIAGNOSIS — R000 Tachycardia, unspecified: Secondary | ICD-10-CM | POA: Diagnosis present

## 2017-01-27 DIAGNOSIS — I509 Heart failure, unspecified: Secondary | ICD-10-CM | POA: Diagnosis not present

## 2017-01-27 LAB — CBC
HCT: 38.3 % (ref 36.0–46.0)
Hemoglobin: 11.9 g/dL — ABNORMAL LOW (ref 12.0–15.0)
MCH: 22.1 pg — AB (ref 26.0–34.0)
MCHC: 31.1 g/dL (ref 30.0–36.0)
MCV: 71.2 fL — AB (ref 78.0–100.0)
PLATELETS: 277 10*3/uL (ref 150–400)
RBC: 5.38 MIL/uL — ABNORMAL HIGH (ref 3.87–5.11)
RDW: 14.4 % (ref 11.5–15.5)
WBC: 7 10*3/uL (ref 4.0–10.5)

## 2017-01-27 LAB — I-STAT TROPONIN, ED: Troponin i, poc: 0.01 ng/mL (ref 0.00–0.08)

## 2017-01-27 LAB — BRAIN NATRIURETIC PEPTIDE: B Natriuretic Peptide: 194 pg/mL — ABNORMAL HIGH (ref 0.0–100.0)

## 2017-01-27 LAB — BASIC METABOLIC PANEL
Anion gap: 8 (ref 5–15)
BUN: 11 mg/dL (ref 6–20)
CHLORIDE: 105 mmol/L (ref 101–111)
CO2: 25 mmol/L (ref 22–32)
CREATININE: 0.5 mg/dL (ref 0.44–1.00)
Calcium: 9.2 mg/dL (ref 8.9–10.3)
GFR calc Af Amer: >60 mL/min (ref >60)
GFR calc non Af Amer: >60 mL/min (ref >60)
Glucose, Bld: 99 mg/dL (ref 65–99)
Potassium: 3.8 mmol/L (ref 3.5–5.1)
SODIUM: 138 mmol/L (ref 135–145)

## 2017-01-27 LAB — TSH: TSH: <0.01 u[IU]/mL — ABNORMAL LOW (ref 0.350–4.500)

## 2017-01-27 LAB — T4, FREE: Free T4: 4.74 ng/dL — ABNORMAL HIGH (ref 0.61–1.12)

## 2017-01-27 MED ORDER — FUROSEMIDE 10 MG/ML IJ SOLN
20.0000 mg | Freq: Once | INTRAMUSCULAR | Status: AC
  Administered 2017-01-27: 20 mg via INTRAVENOUS
  Filled 2017-01-27: qty 2

## 2017-01-27 MED ORDER — SODIUM CHLORIDE 0.9 % IV BOLUS (SEPSIS)
500.0000 mL | Freq: Once | INTRAVENOUS | Status: AC
Start: 2017-01-27 — End: 2017-01-27
  Administered 2017-01-27: 500 mL via INTRAVENOUS

## 2017-01-27 MED ORDER — LABETALOL HCL 200 MG PO TABS
300.0000 mg | ORAL_TABLET | Freq: Two times a day (BID) | ORAL | Status: DC
  Administered 2017-01-28: 08:00:00 300 mg via ORAL
  Filled 2017-01-27: qty 1

## 2017-01-27 MED ORDER — VERAPAMIL HCL 2.5 MG/ML IV SOLN
10.0000 mg | Freq: Once | INTRAVENOUS | Status: AC
  Administered 2017-01-27: 10 mg via INTRAVENOUS
  Filled 2017-01-27: qty 4

## 2017-01-27 NOTE — ED Notes (Signed)
Admitting team at bedside.

## 2017-01-27 NOTE — H&P (Signed)
Family Medicine Teaching [facility] Admission History and Physical Service Pager: [phone]  Patient name: Tara [name] Medical record number: [id number] Date of birth: 07/09/1991 Age: 26 y.o. Gender: female  Primary Care Provider: No PCP Per Patient Consultants: None Code Status: FULL confirmed this admission  Chief Complaint: Palpitations and Dyspnea on exertion  Assessment and Plan: Tara [name] is a 26 y.o. female presenting with palpitations and dyspnea . PMH is significant for Graves disease, Pulmonary Edema, HFpEF  Tachycardia - to 158 on admission, improved with verapamil, likely secondary to Graves disease in the setting of no medications for the past two weeks. TSH undetectable on admission. ACS not likely given iStat troponin negative x1, EKG sinus tachycardia. Stable cardiomegaly noted on CXR, BNP elevated at 194 (but down from 227 on 10/26/16). Other causes of tachycardia were considered: patient is not anemic (Hgb 11.9), appears euvolemic on exam and CXR, no signs of infection without fever or leukocytosis (WBC 7), other vital signs are stable to think of acute shock, and she denies acute pain.  Patient is maintaining O2 saturations at 99%-100%, denies chest pain, and has no unilateral leg swelling or recent travel history to think of PE. Concern for Thyroid Storm >> Tachy, undetectable TSH. But no mental status changes, fever, edema, or GI sxs. Will monitor for worsening sxs. - Admit to FPTS as observation, Attending Dr. Deirdre Priest - monitor on telemetry - obtain T4 and T3 - Trend troponin - check magnesium and phosphate - repeat EKG in AM - will restart home labetalol 300 mg BID in AM - PRN IV metoprolol 2.5 mg Q4h for sustained tachycardia >120 - Monitor mental status, fever, edema, and for GI sxs >> low threshold for transfer to ICU if Sxs show signs of worsening.   Graves disease - Undetectable TSH.  At home on labetalol 300 mg BID and methimazole 5 mg TID. Not  in thyroid storm given no hyperpyrexia, no altered mentation. Patient is tachycardic as discussed above. - restart home medications labetolol, methimazole in AM - Previously followed with Endocrinologist at [facility], has been unable to follow up due to loss of insurance - pending fT4 and T3  Pulmonary Edema, stable  - CXR with grossly unchanged interstitial pulmonary opacities represent mild interstitial edema. No LE edema, lungs sound clear on exam. Patient does endorse recent 8 lb weight gain, note 280 lb this admission from 241 lb measured at discharge 10/2016. 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 CXR this admission with stable cardiomegaly. Sleeps with 4 pillows at baseline. - hold home medications spironolactone 25 mg, losartan 50 mg daily given stable blood pressures - continue home lasix as noted above - consider adding back home antihypertensive regimen as appropriate  FEN/GI: Regular diet, SLIV Prophylaxis: lovenox  Disposition: Admit to Observation Telemetry  History of Present Illness:  Tara [name] is a 26 y.o. female presenting with palpitations and dyspnea on exertion that has worsened over the past two weeks. She was in her usual state of health until two weeks ago when she ran out of all of her medications including her Graves disease regimen of methimazole and labetalol. She has noted heart racing, intermittent chest pain on exertion, and increasing difficulty walking up stairs. She now becomes fatigued lifting utensils where she works at [employer], and is no longer able to  pick up her son.  She denies feeling hot or cold, denies constipation or diarrhea (except one episode one week ago, now resolved), denies confusion or AMS (corroborated by her young daughter at bedside), denies changes in vision,  denies changes in appetite, endorses 8 pound weight gain over last two months.  Notably patient has history of recent admission (discharged 11/03/2016) for sepsis 2/2 CAP resulting in ARDS and intubation.  In the ED the patient was tachycardic to 180 resistant to vagal maneuvers but heart rate improved with verapamil 10 mg x1 and she maintained HR 100-105 throughout our interview.   Review Of Systems: Per HPI with the following additions:   Review of Systems  Constitutional: Negative for chills, diaphoresis, fever and weight loss.  HENT: Negative for congestion, sinus pain and sore throat.   Eyes: Negative for blurred vision and double vision.  Respiratory: Positive for shortness of breath. Negative for cough, hemoptysis, sputum production and wheezing.   Cardiovascular: Positive for chest pain, palpitations and orthopnea. Negative for leg swelling and PND.  Gastrointestinal: Negative for abdominal pain, constipation, diarrhea, heartburn, nausea and vomiting.  Genitourinary: Negative for dysuria, frequency and urgency.  Musculoskeletal: Negative for myalgias.  Skin: Negative for rash.  Neurological: Positive for weakness. Negative for dizziness, tremors and headaches.    Patient Active Problem List   Diagnosis Date Noted  . ARDS (adult respiratory distress syndrome) (HCC)   . Acute on chronic diastolic CHF (congestive heart failure) (HCC)   . Pressure injury of skin 10/30/2016  . Sepsis due to pneumonia (HCC)   . Dilated cardiomyopathy (HCC)   . Pulmonary hypertension   . CAP (community acquired pneumonia) 10/25/2016  . Sepsis (HCC) 10/25/2016  . Acute respiratory failure (HCC) 10/25/2016  . Chronic hypertension with superimposed preeclampsia 07/21/2016  . IUGR, antenatal 07/07/2016  . Tachycardia   . HCAP (healthcare-associated pneumonia) 04/13/2016  . Hyperthyroidism in pregnancy, antepartum 04/13/2016  . Noncompliance   . Acute pulmonary edema (HCC)   . Abnormal maternal  serum screening test 03/30/2016  . Anemia of mother in pregnancy, antepartum 03/01/2016  . Supervision of high risk pregnancy, antepartum 02/26/2016  . Chronic hypertension during pregnancy, antepartum 02/26/2016  . Previous cesarean delivery affecting pregnancy, antepartum 02/26/2016  . Graves disease 02/11/2016  . Acute respiratory failure with hypoxia (HCC) 02/11/2016  . Morbid obesity due to excess calories [facility])     Past Medical History: Past Medical History:  Diagnosis Date  . Anemia   . CAP (community acquired pneumonia) 02/13/2016  . Chronic bronchitis (HCC)   . Graves disease   . Graves disease   . Headache   . Hypertension   . Myocardial infarction 2013  . Obesity   . Pneumonia "several times"    Past Surgical History: Past Surgical History:  Procedure Laterality Date  . CESAREAN SECTION    . CESAREAN SECTION N/A 07/21/2016   Procedure: CESAREAN SECTION;  Surgeon: Lesly Dukes, MD;  Location: [facility] BIRTHING SUITES;  Service: Obstetrics;  Laterality: N/A;  . EYE SURGERY Bilateral 2014   "for Graves Disease; eyelid retraction on left; decompression on the right""    Social History: Social History  Substance Use Topics  . Smoking status: Never Smoker  . Smokeless tobacco: Never Used  . Alcohol use No   Additional social history:  Please also refer to relevant sections of EMR.  Family History: No family history on file. - Maternal hypothyroidism  Allergies and Medications: Allergies  Allergen Reactions  . Iodine Hives  .  Shellfish Allergy Other (See Comments)    Reaction not noted  . Tape Rash   No current facility-administered medications on file prior to encounter.    Current Outpatient Prescriptions on File Prior to Encounter  Medication Sig Dispense Refill  . furosemide (LASIX) 40 MG tablet Take 1 tablet (40 mg total) by mouth daily. Starting Friday 11/05/16 30 tablet 1  . labetalol (NORMODYNE) 300 MG tablet Take 1 tablet (300 mg total) by mouth 2  (two) times daily. 60 tablet 0  . losartan (COZAAR) 50 MG tablet Take 1 tablet (50 mg total) by mouth daily. 30 tablet 1  . methimazole (TAPAZOLE) 5 MG tablet Take 3 tablets (15 mg total) by mouth 3 (three) times daily. 90 tablet 1  . spironolactone (ALDACTONE) 25 MG tablet Take 1 tablet (25 mg total) by mouth daily. 30 tablet 1  . [DISCONTINUED] metoprolol (LOPRESSOR) 50 MG tablet Take 50 mg by mouth 2 (two) times daily.      Objective: BP (!) 107/41   Pulse 97   Temp 97.9 F (36.6 C) (Oral)   Resp 22   Ht 5\' 6"  (1.676 m)   Wt 280 lb (127 kg)   SpO2 99%   BMI 45.19 kg/m  Exam: General: NAD, rests comfortably in bed Eyes: +bil exophthalmos, PERRL, EOMI, no conjuncitval injection or pallor ENTM: no rhinorrhea or congestion, no pharyngeal erythema or exudate, mucous membranes moist Neck: full ROM, no cervical lymphadenopathy Cardiovascular: +tachycardic with normal rhythm, no m/r/g Respiratory: CTA bil, no W/R/R Gastrointestinal: obese, soft, non-tender, non-distended, normoactive BS, no hepatosplenomegaly MSK: moves  Derm: moves 4 extremities equally Neuro: 2+ biceps reflexes bilaterally, unable assess brachioradialis or patellar reflexes (patient tense), no myoclonus bil Psych: AAOx3, linear thought process, appropriate affect  Labs and Imaging: CBC BMET   Recent Labs Lab 01/27/17 2038  WBC 7.0  HGB 11.9*  HCT 38.3  PLT 277    Recent Labs Lab 01/27/17 2038  NA 138  K 3.8  CL 105  CO2 25  BUN 11  CREATININE 0.50  GLUCOSE 99  CALCIUM 9.2     Pertinent Labs: - TSH undetectable - ECG - Sinus tachycardia, LVH - Troponin neg x1  Dg Chest Portable 1 View -  01/27/2017 FINDINGS: Pacer apparatus overlies the left hemithorax. Monitoring leads overlie the patient. Stable cardiomegaly. No consolidative pulmonary opacities. Grossly unchanged interstitial pulmonary opacities. No pleural effusion or pneumothorax. IMPRESSION:  Grossly unchanged interstitial opacities which  may represent mild interstitial pulmonary edema. Cardiomegaly.   Howard Pouch, MD 01/27/2017, 10:30 PM PGY-1, [facility] Family Medicine FPTS Intern pager: [phone], text pages welcome   Upper Level Addendum:  I have seen and evaluated this patient along with Dr. Mosetta Putt and reviewed the above note, making necessary revisions in red.   Kathee Delton, MD,MS,  PGY3 01/28/2017 12:42 AM

## 2017-01-27 NOTE — ED Notes (Signed)
RN called main lab to add on T4

## 2017-01-27 NOTE — ED Provider Notes (Signed)
[facility]-EMERGENCY DEPT Provider Note  CSN: [id number] Arrival date & time: 01/27/17  2016  History   Chief Complaint Chief Complaint  Patient presents with  . Chest Pain  . Palpitations    SVT   HPI Tara [name] is a 26 y.o. female.  The history is provided by the patient and medical records. No language interpreter was used.  Illness  This is a new problem. The current episode started more than 2 days ago. The problem occurs constantly. The problem has been gradually worsening. Associated symptoms include chest pain and shortness of breath. Pertinent negatives include no abdominal pain and no headaches. The symptoms are aggravated by exertion. Nothing relieves the symptoms.   Past Medical History:  Diagnosis Date  . Anemia   . CAP (community acquired pneumonia) 02/13/2016  . Chronic bronchitis (HCC)   . Graves disease   . Graves disease   . Headache   . Hypertension   . Myocardial infarction 2013  . Obesity   . Pneumonia "several times"   Patient Active Problem List   Diagnosis Date Noted  . ARDS (adult respiratory distress syndrome) (HCC)   . Acute on chronic diastolic CHF (congestive heart failure) (HCC)   . Pressure injury of skin 10/30/2016  . Sepsis due to pneumonia (HCC)   . Dilated cardiomyopathy (HCC)   . Pulmonary hypertension   . CAP (community acquired pneumonia) 10/25/2016  . Sepsis (HCC) 10/25/2016  . Acute respiratory failure (HCC) 10/25/2016  . Chronic hypertension with superimposed preeclampsia 07/21/2016  . IUGR, antenatal 07/07/2016  . Tachycardia   . HCAP (healthcare-associated pneumonia) 04/13/2016  . Hyperthyroidism in pregnancy, antepartum 04/13/2016  . Noncompliance   . Acute pulmonary edema (HCC)   . Abnormal maternal serum screening test 03/30/2016  . Anemia of mother in pregnancy, antepartum 03/01/2016  . Supervision of high risk pregnancy, antepartum 02/26/2016  . Chronic hypertension during pregnancy, antepartum 02/26/2016  . Previous  cesarean delivery affecting pregnancy, antepartum 02/26/2016  . Graves disease 02/11/2016  . Acute respiratory failure with hypoxia (HCC) 02/11/2016  . Morbid obesity due to excess calories [facility])    Past Surgical History:  Procedure Laterality Date  . CESAREAN SECTION    . CESAREAN SECTION N/A 07/21/2016   Procedure: CESAREAN SECTION;  Surgeon: Lesly Dukes, MD;  Location: [facility] BIRTHING SUITES;  Service: Obstetrics;  Laterality: N/A;  . EYE SURGERY Bilateral 2014   "for Graves Disease; eyelid retraction on left; decompression on the right""   OB History    Gravida Para Term Preterm AB Living   5 5 4 1  0 5   SAB TAB Ectopic Multiple Live Births   0 0 0 0 4     Home Medications    Prior to Admission medications   Medication Sig Start Date End Date Taking? Authorizing Provider  furosemide (LASIX) 40 MG tablet Take 1 tablet (40 mg total) by mouth daily. Starting Friday 11/05/16 11/05/16  Yes Osvaldo Shipper, MD  labetalol (NORMODYNE) 300 MG tablet Take 1 tablet (300 mg total) by mouth 2 (two) times daily. 11/03/16  Yes Osvaldo Shipper, MD  losartan (COZAAR) 50 MG tablet Take 1 tablet (50 mg total) by mouth daily. 11/04/16  Yes Osvaldo Shipper, MD  methimazole (TAPAZOLE) 5 MG tablet Take 3 tablets (15 mg total) by mouth 3 (three) times daily. 11/03/16  Yes Osvaldo Shipper, MD  spironolactone (ALDACTONE) 25 MG tablet Take 1 tablet (25 mg total) by mouth daily. 11/04/16  Yes Osvaldo Shipper, MD  Family History No family history on file.  Social History Social History  Substance Use Topics  . Smoking status: Never Smoker  . Smokeless tobacco: Never Used  . Alcohol use No   Allergies   Iodine; Shellfish allergy; and Tape  Review of Systems Review of Systems  Constitutional: Positive for fatigue.  Respiratory: Positive for shortness of breath.   Cardiovascular: Positive for chest pain and palpitations.  Gastrointestinal: Negative for abdominal pain.  Neurological: Negative for  headaches.  All other systems reviewed and are negative.  Physical Exam Updated Vital Signs BP (!) 107/41   Pulse 97   Temp 97.9 F (36.6 C) (Oral)   Resp 22   Ht 5\' 6"  (1.676 m)   Wt 127 kg   SpO2 99%   BMI 45.19 kg/m   Physical Exam  Constitutional: She is oriented to person, place, and time. No distress.  Overweight young African-American female  HENT:  Head: Normocephalic and atraumatic.  Eyes: EOM are normal. Pupils are equal, round, and reactive to light.  Neck: Normal range of motion. Neck supple.  Cardiovascular: Regular rhythm and normal heart sounds.  Tachycardia present.   Pulmonary/Chest: Effort normal and breath sounds normal.  Abdominal: Soft. Bowel sounds are normal. She exhibits no distension. There is no tenderness.  Musculoskeletal: Normal range of motion.  Neurological: She is alert and oriented to person, place, and time.  Skin: Skin is warm and dry. Capillary refill takes less than 2 seconds. She is not diaphoretic.  Nursing note and vitals reviewed.  ED Treatments / Results  Labs (all labs ordered are listed, but only abnormal results are displayed) Labs Reviewed  CBC - Abnormal; Notable for the following:       Result Value   RBC 5.38 (*)    Hemoglobin 11.9 (*)    MCV 71.2 (*)    MCH 22.1 (*)    All other components within normal limits  TSH - Abnormal; Notable for the following:    TSH <0.010 (*)    All other components within normal limits  BRAIN NATRIURETIC PEPTIDE - Abnormal; Notable for the following:    B Natriuretic Peptide 194.0 (*)    All other components within normal limits  BASIC METABOLIC PANEL  T4, FREE  RAPID URINE DRUG SCREEN, HOSP PERFORMED  I-STAT TROPOININ, ED   EKG  EKG Interpretation None      Radiology Dg Chest Portable 1 View  Result Date: 01/27/2017 CLINICAL DATA:  Patient with central chest pain and palpitations. Shortness of breath. EXAM: PORTABLE CHEST 1 VIEW COMPARISON:  Chest radiograph 11/01/2016.  FINDINGS: Pacer apparatus overlies the left hemithorax. Monitoring leads overlie the patient. Stable cardiomegaly. No consolidative pulmonary opacities. Grossly unchanged interstitial pulmonary opacities. No pleural effusion or pneumothorax. IMPRESSION: Grossly unchanged interstitial opacities which may represent mild interstitial pulmonary edema. Cardiomegaly. Electronically Signed   By: Annia Belt M.D.   On: 01/27/2017 21:30   Procedures Procedures (including critical care time)  Medications Ordered in ED Medications  sodium chloride 0.9 % bolus 500 mL (0 mLs Intravenous Stopped 01/27/17 2153)  verapamil (ISOPTIN) injection 10 mg (10 mg Intravenous Given 01/27/17 2110)  furosemide (LASIX) injection 20 mg (20 mg Intravenous Given 01/27/17 2226)   Initial Impression / Assessment and Plan / ED Course  I have reviewed the triage vital signs and the nursing notes.  26 y.o. female with above stated PMHx, HPI, and physical. Patient with past medical history of Graves' disease. Patient recently admitted over past several  months for severe sepsis during which time she was in the hospital for 10 days. Symptom onset over past week. Generalized fatigue, weakness, shortness of breath, palpitations. Symptoms worsening today. Patient has no history of tachy-dysrhythmia   patient is noted to have tachycardia to 170s on telemetry. Believe this is SVT. Attempted Valsalva maneuver and patient improved to 120s. Questionable sinus tachycardia versus SVT with matched atrial and ventricular rates. Patient given verapamil with further improvement in heart rate to 90s. Troponin not elevated. Patient with known diastolic heart failure and pulmonary edema on chest x-ray. Patient given Lasix for diuresis.  Laboratory and imaging results were personally reviewed by myself and used in the medical decision making of this patient's treatment and disposition.  Pt admitted to family medicine for further evaluation and management of  above. Pt understands and agrees with the plan and has no further questions or concerns.   Pt care discussed with and followed by my attending, Dr. Melene Plan  Angelina Ok, MD Pager [phone]  Final Clinical Impressions(s) / ED Diagnoses   Final diagnoses:  SVT (supraventricular tachycardia) (HCC)  Generalized weakness  Palpitations  SOB (shortness of breath)  History of diastolic dysfunction   New Prescriptions New Prescriptions   No medications on file     Angelina Ok, MD 01/27/17 2255    Melene Plan, DO 01/27/17 2257    Melene Plan, DO 01/27/17 2258

## 2017-01-27 NOTE — ED Notes (Signed)
Floor refusing to accept pt onto unit d/t pts 28 yo daughter; pt states she is not from here and there is no one nearby to care for the child while she is admitted to hospital; pt now considering leaving AMA d/t child care issues; Admitting MD informed of same and encourages pt to stay for admission; will discuss case with house coverage and ED charge RN

## 2017-01-27 NOTE — ED Triage Notes (Signed)
Patient reports central chest pain and palpitations onset last week with mild SOB , denies nausea or diaphoresis , HR = 158/min at arrival .

## 2017-01-28 DIAGNOSIS — I110 Hypertensive heart disease with heart failure: Secondary | ICD-10-CM | POA: Diagnosis not present

## 2017-01-28 DIAGNOSIS — E0500 Thyrotoxicosis with diffuse goiter without thyrotoxic crisis or storm: Secondary | ICD-10-CM | POA: Diagnosis not present

## 2017-01-28 DIAGNOSIS — I471 Supraventricular tachycardia: Secondary | ICD-10-CM | POA: Diagnosis not present

## 2017-01-28 DIAGNOSIS — E0590 Thyrotoxicosis, unspecified without thyrotoxic crisis or storm: Secondary | ICD-10-CM | POA: Diagnosis present

## 2017-01-28 DIAGNOSIS — I509 Heart failure, unspecified: Secondary | ICD-10-CM | POA: Diagnosis not present

## 2017-01-28 LAB — PHOSPHORUS: PHOSPHORUS: 3.6 mg/dL (ref 2.5–4.6)

## 2017-01-28 LAB — CBC
HCT: 35.4 % — ABNORMAL LOW (ref 36.0–46.0)
Hemoglobin: 11.1 g/dL — ABNORMAL LOW (ref 12.0–15.0)
MCH: 22.5 pg — AB (ref 26.0–34.0)
MCHC: 31.4 g/dL (ref 30.0–36.0)
MCV: 71.7 fL — AB (ref 78.0–100.0)
PLATELETS: 250 10*3/uL (ref 150–400)
RBC: 4.94 MIL/uL (ref 3.87–5.11)
RDW: 14.3 % (ref 11.5–15.5)
WBC: 5.6 10*3/uL (ref 4.0–10.5)

## 2017-01-28 LAB — RAPID URINE DRUG SCREEN, HOSP PERFORMED
Amphetamines: NOT DETECTED
Barbiturates: NOT DETECTED
Benzodiazepines: NOT DETECTED
Cocaine: NOT DETECTED
OPIATES: NOT DETECTED
Tetrahydrocannabinol: NOT DETECTED

## 2017-01-28 LAB — BASIC METABOLIC PANEL
Anion gap: 9 (ref 5–15)
BUN: 9 mg/dL (ref 6–20)
CHLORIDE: 107 mmol/L (ref 101–111)
CO2: 25 mmol/L (ref 22–32)
CREATININE: 0.52 mg/dL (ref 0.44–1.00)
Calcium: 9.2 mg/dL (ref 8.9–10.3)
GFR calc Af Amer: >60 mL/min (ref >60)
GFR calc non Af Amer: >60 mL/min (ref >60)
Glucose, Bld: 100 mg/dL — ABNORMAL HIGH (ref 65–99)
Potassium: 3.7 mmol/L (ref 3.5–5.1)
Sodium: 141 mmol/L (ref 135–145)

## 2017-01-28 LAB — TROPONIN I
Troponin I: <0.03 ng/mL (ref <0.03)
Troponin I: <0.03 ng/mL (ref <0.03)

## 2017-01-28 LAB — MRSA PCR SCREENING: MRSA BY PCR: NEGATIVE

## 2017-01-28 LAB — MAGNESIUM: Magnesium: 1.6 mg/dL — ABNORMAL LOW (ref 1.7–2.4)

## 2017-01-28 MED ORDER — ACETAMINOPHEN 325 MG PO TABS: 650.0000 mg | ORAL_TABLET | Freq: Four times a day (QID) | ORAL | Status: DC | PRN

## 2017-01-28 MED ORDER — METHIMAZOLE 5 MG PO TABS: 15.0000 mg | ORAL_TABLET | Freq: Three times a day (TID) | ORAL | 0 refills | Status: DC

## 2017-01-28 MED ORDER — FUROSEMIDE 40 MG PO TABS
40.0000 mg | ORAL_TABLET | Freq: Every day | ORAL | Status: DC
  Administered 2017-01-28: 40 mg via ORAL
  Filled 2017-01-28: qty 1

## 2017-01-28 MED ORDER — SODIUM CHLORIDE 0.9 % IV SOLN: 250.0000 mL | INTRAVENOUS | Status: DC | PRN

## 2017-01-28 MED ORDER — CARVEDILOL 12.5 MG PO TABS
12.5000 mg | ORAL_TABLET | Freq: Two times a day (BID) | ORAL | Status: DC
  Administered 2017-01-28: 12.5 mg via ORAL
  Filled 2017-01-28: qty 1

## 2017-01-28 MED ORDER — SODIUM CHLORIDE 0.9% FLUSH
3.0000 mL | INTRAVENOUS | Status: DC | PRN
Start: 2017-01-28 — End: 2017-01-28

## 2017-01-28 MED ORDER — SODIUM CHLORIDE 0.9% FLUSH
3.0000 mL | Freq: Two times a day (BID) | INTRAVENOUS | Status: DC
Start: 2017-01-28 — End: 2017-01-28
  Administered 2017-01-28: 3 mL via INTRAVENOUS

## 2017-01-28 MED ORDER — MAGNESIUM SULFATE 2 GM/50ML IV SOLN
2.0000 g | Freq: Once | INTRAVENOUS | Status: AC
  Administered 2017-01-28: 2 g via INTRAVENOUS
  Filled 2017-01-28: qty 50

## 2017-01-28 MED ORDER — METOPROLOL TARTRATE 5 MG/5ML IV SOLN
2.5000 mg | INTRAVENOUS | Status: DC | PRN
  Administered 2017-01-28 (×2): 2.5 mg via INTRAVENOUS
  Filled 2017-01-28 (×4): qty 5

## 2017-01-28 MED ORDER — METHIMAZOLE 10 MG PO TABS
15.0000 mg | ORAL_TABLET | Freq: Three times a day (TID) | ORAL | Status: DC
  Administered 2017-01-28 (×2): 15 mg via ORAL
  Filled 2017-01-28 (×2): qty 1.5

## 2017-01-28 MED ORDER — CARVEDILOL 12.5 MG PO TABS: 12.5000 mg | ORAL_TABLET | Freq: Two times a day (BID) | ORAL | 0 refills | Status: DC

## 2017-01-28 MED ORDER — CARVEDILOL 12.5 MG PO TABS: 12.5000 mg | ORAL_TABLET | Freq: Two times a day (BID) | ORAL | Status: DC

## 2017-01-28 MED ORDER — SODIUM CHLORIDE 0.9% FLUSH
3.0000 mL | Freq: Two times a day (BID) | INTRAVENOUS | Status: DC
  Administered 2017-01-28: 3 mL via INTRAVENOUS

## 2017-01-28 MED ORDER — ENOXAPARIN SODIUM 60 MG/0.6ML ~~LOC~~ SOLN: 60.0000 mg | SUBCUTANEOUS | Status: DC

## 2017-01-28 MED ORDER — ACETAMINOPHEN 650 MG RE SUPP: 650.0000 mg | Freq: Four times a day (QID) | RECTAL | Status: DC | PRN

## 2017-01-28 NOTE — Discharge Instructions (Signed)
Please take Methimazole 3 times a day and Coreg twice a day Follow up as scheduled on Monday with Family Medicine If you worsen over the weekend, please return to the ED.  Hyperthyroidism Hyperthyroidism is when the thyroid is too active (overactive). Your thyroid is a large gland that is located in your neck. The thyroid helps to control how your body uses food (metabolism). When your thyroid is overactive, it produces too much of a hormone called thyroxine. What are the causes? Causes of hyperthyroidism may include:  Graves disease. This is when your immune system attacks the thyroid gland. This is the most common cause.  Inflammation of the thyroid gland.  Tumor in the thyroid gland or somewhere else.  Excessive use of thyroid medicines, including:  Prescription thyroid supplement.  Herbal supplements that mimic thyroid hormones.  Solid or fluid-filled lumps within your thyroid gland (thyroid nodules).  Excessive ingestion of iodine. What increases the risk?  Being female.  Having a family history of thyroid conditions. What are the signs or symptoms? Signs and symptoms of hyperthyroidism may include:  Nervousness.  Inability to tolerate heat.  Unexplained weight loss.  Diarrhea.  Change in the texture of hair or skin.  Heart skipping beats or making extra beats.  Rapid heart rate.  Loss of menstruation.  Shaky hands.  Fatigue.  Restlessness.  Increased appetite.  Sleep problems.  Enlarged thyroid gland or nodules. How is this diagnosed? Diagnosis of hyperthyroidism may include:  Medical history and physical exam.  Blood tests.  Ultrasound tests. How is this treated? Treatment may include:  Medicines to control your thyroid.  Surgery to remove your thyroid.  Radiation therapy. Follow these instructions at home:  Take medicines only as directed by your health care provider.  Do not use any tobacco products, including cigarettes, chewing  tobacco, or electronic cigarettes. If you need help quitting, ask your health care provider.  Do not exercise or do physical activity until your health care provider approves.  Keep all follow-up appointments as directed by your health care provider. This is important. Contact a health care provider if:  Your symptoms do not get better with treatment.  You have fever.  You are taking thyroid replacement medicine and you:  Have depression.  Feel mentally and physically slow.  Have weight gain. Get help right away if:  You have decreased alertness or a change in your awareness.  You have abdominal pain.  You feel dizzy.  You have a rapid heartbeat.  You have an irregular heartbeat. This information is not intended to replace advice given to you by your health care provider. Make sure you discuss any questions you have with your health care provider. Document Released: 12/06/2005 Document Revised: 05/06/2016 Document Reviewed: 04/23/2014 Elsevier Interactive Patient Education  2017 [employer].

## 2017-01-28 NOTE — Progress Notes (Signed)
Stopped by to explain/discuss advanced directive (AD) w/ pt, but she was having med ministrations. Gave AD form to nurse, who said she would pass it on to pt to review on wkend. Chaplain available for f/u on Monday. Volunteers needed to witness form are gone for day.    01/28/17 1500  Clinical Encounter Type  Visited With Patient not available  Visit Type Follow-up;Other (Comment) (advanced directive)  Referral From Nurse   Ephraim Hamburger, Chaplain
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
## 2017-01-28 NOTE — Clinical Social Work Note (Signed)
RNCM has been working with patient today and states that patient's friend will be at the hospital around 4:00 today to pick the patient's daughter up. RNCM has also worked with patient about not being able to afford medications. CSW appreciates RNCM's work in these matters.  CSW signing off. Consult again if any other social work needs arise.  Charlynn Court, CSW [phone]

## 2017-01-28 NOTE — Progress Notes (Signed)
Transitions of Care Pharmacy Note  Plan:  Educated on carvedilol (chnaged from labetalol) and methimazole  Addressed concerns regarding thyroid medications  --------------------------------------------- Tara [name] is an 26 y.o. female who presents with a chief complaint of palpitations. In anticipation of discharge, pharmacy has reviewed this patient's prior to admission medication history, as well as current inpatient medications listed per the [facility].  Current medication indications, dosing, frequency, and notable side effects reviewed with patient. She verbalized understanding of current inpatient medication regimen and is aware that the After Visit Summary when presented, will represent the most accurate medication list at discharge.   We discussed that the patient will now be taking carvedilol instead of labetalol. We discussed that both are used to slow the heart rate and relieve symptoms of hyperthyroidism, but the carvedilol should be less expensive for her. The methimazole will continue to be important for actual treatment of her hyperthyroidism.   Assessment: Understanding of regimen: good Understanding of indications: good Potential of compliance: good Barriers to Obtaining Medications: No  Patient instructed to contact inpatient pharmacy team with further questions or concerns if needed.    Time spent preparing for discharge counseling: 10 minutes Time spent counseling patient: 10 minutes   Thank you for allowing pharmacy to be a part of this patient's care.  Carylon Perches, PharmD Acute Care Pharmacy Resident  Pager: [phone] 01/28/2017

## 2017-01-28 NOTE — Progress Notes (Signed)
Responded to consult to create advanced directive. Pt was speaking w/ dr. Stann Mainland check back later.   01/28/17 0900  Clinical Encounter Type  Visited With Patient not available  Visit Type Initial;Psychological support;Spiritual support;Social support  Referral From Nurse   Ephraim Hamburger, Chaplain

## 2017-01-28 NOTE — Progress Notes (Signed)
Patient given discharge instructions and all questions answered.  Pt. Discharged with all belongings via wheelchair.   

## 2017-01-28 NOTE — ED Notes (Signed)
Pt in agreeance to stay the night until morning when CM and SW are here to assist patient; floor RN, ED Charge RN, and House Coverage notified of same

## 2017-01-28 NOTE — Progress Notes (Signed)
Family Medicine Teaching Service Daily Progress Note Intern Pager: [phone]  Patient name: Tara [name] Medical record number: [id number] Date of birth: [birth year] Age: 26 y.o. Gender: female  Primary Care Provider: No PCP Per Patient Consultants: none Code Status: full  Pt Overview and Major Events to Date:  01/27/17- admitted to FPTS  Assessment and Plan: Tara [name] is a 26 y.o. female presenting with palpitations and dyspnea . PMH is significant for Graves disease, Pulmonary Edema, HFpEF  Tachycardia- improving - to 158 on admission, improved with verapamil, likely secondary to Graves disease in the setting of no medications for the past two weeks. TSH undetectable on admission. ACS not likely given iStat troponin negative x1, EKG sinus tachycardia. Stable cardiomegaly noted on CXR, BNP elevated at 194 (but down from 227 on 10/26/16). Other causes of tachycardia were considered: patient is not anemic (Hgb 11.9), appears euvolemic on exam and CXR, no signs of infection without fever or leukocytosis (WBC 7), other vital signs are stable to think of acute shock, and she denies acute pain.  Patient is maintaining O2 saturations at 99%-100%, denies chest pain, and has no unilateral leg swelling or recent travel history to think of PE. Concern for Thyroid Storm >> Tachy, undetectable TSH. But no mental status changes, fever, edema, or GI sxs. Will monitor for worsening sxs. - monitor on telemetry - Troponin negative x 3 - magnesium low, phosphate normal. Will replete magnesium - repeat EKG in AM with tachycardia - continue home labetalol 300 mg BID - PRN IV metoprolol 2.5 mg Q4h for sustained tachycardia >120 - Monitor mental status, fever, edema, and for GI sxs >> low threshold for transfer to ICU if Sxs show signs of worsening.  -consult care management for difficulty affording medications  Graves disease - Undetectable TSH.  At home on labetalol 300 mg BID and methimazole 5 mg  TID. Not in thyroid storm given no hyperpyrexia, no altered mentation. - restart home medications labetolol, methimazole - Previously followed with Endocrinologist at [facility], has been unable to follow up due to loss of insurance - free T4 elevated at 4.74 - free T3 pending - care management consulted for affording medications  Pulmonary Edema, stable  - CXR with grossly unchanged interstitial pulmonary opacities represent mild interstitial edema. No LE edema, lungs sound clear on exam. Patient does endorse recent 8 lb weight gain, note 280 lb this admission from 241 lb measured at discharge 10/2016. 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 CXR this admission with stable cardiomegaly. Sleeps with 4 pillows at baseline. - hold home medications spironolactone 25 mg, losartan 50 mg daily given stable blood pressures - continue home lasix as noted above - consider adding back home antihypertensive regimen as appropriate  FEN/GI: Regular diet, SLIV Prophylaxis: lovenox  Disposition: pending clinical improvement  Subjective:  Tara [name] is feeling well today, can still feel her heart beating fast. Fatigue has improved. Feels better overall. Would like to go home today if able. She denies chest pain, swelling, confusion. Has diarrhea.   Objective: Temp:  [97.9 F (36.6 C)-98 F (36.7 C)] 98 F (36.7 C) (02/09 0548) Pulse Rate:  [95-158] 96 (02/09 0548) Resp:  [16-34] 20 (02/09 0548) BP: (102-148)/(41-97) 114/50 (02/09 0548) SpO2:  [99 %-100 %] 100 % (02/09 0548) Weight:  [246 lb 1.6 oz (111.6 kg)-280  lb (127 kg)] 246 lb 1.6 oz (111.6 kg) (02/09 0600) Physical Exam: General: middle aged lady sitting up on side of bed in NAD Cardiovascular: tachycardic. Regular rhythm. No MRG Respiratory: CTAB no increased work of  breathing Abdomen: soft, non-tender, non-distended, hyperactive BS Extremities: no edema or cyanosis  Laboratory:  Recent Labs Lab 01/27/17 2038  WBC 7.0  HGB 11.9*  HCT 38.3  PLT 277    Recent Labs Lab 01/27/17 2038  NA 138  K 3.8  CL 105  CO2 25  BUN 11  CREATININE 0.50  CALCIUM 9.2  GLUCOSE 99   T4- 4.74  Imaging/Diagnostic Tests: Dg Chest Portable 1 View  Result Date: 01/27/2017 CLINICAL DATA:  Patient with central chest pain and palpitations. Shortness of breath. EXAM: PORTABLE CHEST 1 VIEW COMPARISON:  Chest radiograph 11/01/2016. FINDINGS: Pacer apparatus overlies the left hemithorax. Monitoring leads overlie the patient. Stable cardiomegaly. No consolidative pulmonary opacities. Grossly unchanged interstitial pulmonary opacities. No pleural effusion or pneumothorax. IMPRESSION: Grossly unchanged interstitial opacities which may represent mild interstitial pulmonary edema. Cardiomegaly. Electronically Signed   By: Annia Belt M.D.   On: 01/27/2017 21:30     Tillman Sers, DO 01/28/2017, 8:12 AM PGY-1, [facility] FPTS Intern pager: [phone], text pages welcome

## 2017-01-28 NOTE — Discharge Summary (Signed)
Family Medicine Teaching [facility] Discharge Summary  Patient name: Tara [name] Medical record number: [id number] Date of birth: [birth year] Age: 26 y.o. Gender: female Date of Admission: 01/27/2017  Date of Discharge: 01/28/17  Admitting Physician: Carney Living, MD  Primary Care Provider: No PCP Per Patient Consultants: none  Indication for Hospitalization: Grave's disease  Discharge Diagnoses/Problem List:  Grave's disease Tachycardia HTN Heart failure  Disposition: home  Discharge Condition: stable  Discharge Exam: see progress note from day of discharge  Brief Hospital Course:   Tara [name] is a 26 year old female with PMH of Grave's disease, HTN, and CHF who presented to [facility] ED with fatigue and palpitations. She was admitted to [facility] for management. She had run out of her home thyroid and blood pressure medications roughly 2-3 weeks prior to discharge. TSH was undetectable at time of admission. She was restarted on her home medications of methimazole and labetalol and given IV metoprolol as needed for tachycardia >120bpm. Her home blood pressure medications were held due to low blood pressures on admission. Social work and case management was consulted to help patient with affording medications and getting insurance. She was transitioned from labetalol 300 mg bid to carvedilol 12.5 mg BID. Patient was eager to leave the day after admission to get home to her other children. She was set up with medication assistance to get refills on her prescriptions and scheduled for close follow up on 01/31/17 in the [facility] clinic. She was medically stable for discharge.  Issues for Follow Up:  1. Recommend patient follow up with CSW for affording medications and insurance needs- this process was started in the hospital 2. Follow up blood pressure control, home spironolactone and losartan held on discharge, can restart as able 3. Monitor tachycardia 4. May benefit from  surgical referral for thyroidectomy  5. Patient to follow up with HF clinic  Significant Procedures: none  Significant Labs and Imaging:   Recent Labs Lab 01/27/17 2038 01/28/17 0754  WBC 7.0 5.6  HGB 11.9* 11.1*  HCT 38.3 35.4*  PLT 277 250    Recent Labs Lab 01/27/17 2038 01/28/17 0104 01/28/17 0754  NA 138  --  141  K 3.8  --  3.7  CL 105  --  107  CO2 25  --  25  GLUCOSE 99  --  100*  BUN 11  --  9  CREATININE 0.50  --  0.52  CALCIUM 9.2  --  9.2  MG  --  1.6*  --   PHOS  --  3.6  --    Free T4: 4.74 TSH <0.010  Results/Tests Pending at Time of Discharge: free T3  Discharge Medications:  Allergies as of 01/28/2017      Reactions   Iodine Hives   Shellfish Allergy Other (See Comments)   Reaction not noted   Tape Rash      Medication List    STOP taking these medications   furosemide 40 MG tablet Commonly known as:  LASIX   labetalol 300 MG tablet Commonly known as:  NORMODYNE   losartan 50 MG tablet Commonly known as:  COZAAR   spironolactone 25 MG tablet Commonly known as:  ALDACTONE     TAKE these medications   carvedilol 12.5 MG tablet Commonly known as:  COREG Take 1 tablet (12.5 mg total) by mouth 2 (two) times daily with a meal.   methimazole 5 MG tablet Commonly known as:  TAPAZOLE Take 3 tablets (15  mg total) by mouth 3 (three) times daily. What changed:  Another medication with the same name was added. Make sure you understand how and when to take each.   methimazole 5 MG tablet Commonly known as:  TAPAZOLE Take 3 tablets (15 mg total) by mouth 3 (three) times daily. What changed:  You were already taking a medication with the same name, and this prescription was added. Make sure you understand how and when to take each.       Discharge Instructions: Please refer to Patient Instructions section of EMR for full details.  Patient was counseled important signs and symptoms that should prompt return to medical care, changes in  medications, dietary instructions, activity restrictions, and follow up appointments.   Follow-Up Appointments: Follow-up Information    [name], MD. Go on 01/31/2017.   Specialty:  Family Medicine Why:  at 10 am  Contact information: [address] [place] [address] [phone]           Tillman Sers, DO 01/28/2017, 9:55 PM PGY-1, [facility]

## 2017-01-28 NOTE — Progress Notes (Signed)
Patient transferred to 3 east from Ed. Patient accompanied by 28 year old daughter. Patient is alert and oriented. Vitals stable. Belongings policy explained to patient. Patient verbalized understanding. Patient in bed with daughter at bedside. Will continue to monitor.

## 2017-01-28 NOTE — Progress Notes (Signed)
Patient's heart rate sustaining in 90s since 1623.  Ok to discharge patient per Dr. Wonda Olds.

## 2017-01-28 NOTE — Progress Notes (Signed)
Patient's heart rate elevated to 140s sustained.  Patient given prn IV metoprolol as ordered.  Patient's heart rate still elevated in 130s, Dr. Wonda Olds stated to give coreg now. Will continue to monitor.

## 2017-01-29 LAB — T3: T3 TOTAL: 494 ng/dL — AB (ref 71–180)

## 2017-01-31 ENCOUNTER — Ambulatory Visit (INDEPENDENT_AMBULATORY_CARE_PROVIDER_SITE_OTHER): Payer: Self-pay | Admitting: Family Medicine

## 2017-01-31 ENCOUNTER — Telehealth: Payer: Self-pay | Admitting: *Deleted

## 2017-01-31 VITALS — BP 130/86 | HR 101 | Temp 98.1°F | Ht 64.0 in | Wt 252.6 lb

## 2017-01-31 DIAGNOSIS — Z91199 Patient's noncompliance with other medical treatment and regimen due to unspecified reason: Secondary | ICD-10-CM

## 2017-01-31 DIAGNOSIS — Z9119 Patient's noncompliance with other medical treatment and regimen: Secondary | ICD-10-CM

## 2017-01-31 DIAGNOSIS — Z30011 Encounter for initial prescription of contraceptive pills: Secondary | ICD-10-CM

## 2017-01-31 DIAGNOSIS — I420 Dilated cardiomyopathy: Secondary | ICD-10-CM

## 2017-01-31 DIAGNOSIS — R000 Tachycardia, unspecified: Secondary | ICD-10-CM

## 2017-01-31 DIAGNOSIS — E0500 Thyrotoxicosis with diffuse goiter without thyrotoxic crisis or storm: Secondary | ICD-10-CM

## 2017-01-31 DIAGNOSIS — Z3009 Encounter for other general counseling and advice on contraception: Secondary | ICD-10-CM | POA: Insufficient documentation

## 2017-01-31 LAB — POCT URINE PREGNANCY: Preg Test, Ur: NEGATIVE

## 2017-01-31 MED ORDER — NORGESTIMATE-ETH ESTRADIOL 0.25-35 MG-MCG PO TABS: 1.0000 | ORAL_TABLET | Freq: Every day | ORAL | 11 refills | Status: DC

## 2017-01-31 MED ORDER — FUROSEMIDE 40 MG PO TABS: 40.0000 mg | ORAL_TABLET | Freq: Every day | ORAL | 1 refills | Status: DC

## 2017-01-31 MED ORDER — CARVEDILOL 12.5 MG PO TABS: 12.5000 mg | ORAL_TABLET | Freq: Two times a day (BID) | ORAL | 0 refills | Status: DC

## 2017-01-31 MED ORDER — METHIMAZOLE 5 MG PO TABS: 15.0000 mg | ORAL_TABLET | Freq: Three times a day (TID) | ORAL | 1 refills | Status: DC

## 2017-01-31 MED FILL — methIMAzole 5 MG TABS: 5 | 30 days supply | Qty: 270 | Fill #0

## 2017-01-31 MED FILL — CARVEDILOL 12.5 MG TABLET: 12.5 | 30 days supply | Qty: 60 | Fill #0

## 2017-01-31 MED FILL — FUROSEMIDE 40 MG TABLET: 40 | 30 days supply | Qty: 30 | Fill #0

## 2017-01-31 NOTE — Telephone Encounter (Signed)
Patient thought the Match program would cover cost of taxi. Spoke with Garen Lah Care Manager [phone], who states Match covers cost of meds but not taxi. Patient states she used all her money on taxi waiting at [facility] for Match to go through but had to leave for OV and was not able to get meds. Patient now has no way to get home. Cost of taxi Scientist, research (medical)) to be paid by [facility] indigent fund. Patient given [facility] # to Optician, dispensing at [facility] to learn when medicaid will be active. Kinnie Feil, RN, BSN

## 2017-01-31 NOTE — Assessment & Plan Note (Signed)
Hx of betablocker due to persistent tachycardia secondary to poor control of Graves disease. Was transitioned from labetalol to coreg at last admission. Rewrote coreg to [facility] outpatient pharmacy for $3 through [facility] program. Not tachycardic on my exam, but feels tachycardic with minimal exertion at home.

## 2017-01-31 NOTE — Telephone Encounter (Signed)
    Patient is unable to afford prescriptions (methimazole and coreg) written today by Dr. Chanetta Marshall. Patient's MCD is pending. Spoke with Thayer Ohm at [facility] regarding assistance with obtaining these prescriptions. [facility] written in comments on e-Rx by provider and voucher filled out and turned into [place] [facility]. Leward Quan, RN, BSN

## 2017-01-31 NOTE — Progress Notes (Signed)
CC: hospital follow up  HPI Patient admitted [facility], discharged 2/10. Tachycardic due to inability to afford methimazole and labetalol for almost 1 month prior to admission. Since discharge, was unable to afford the copay of these medications [facility]. Also unable to obtain cab ride to pharmacy and required voucher for appt today. Lives with FOB, 28 yr old, and 28 yr old. Understands Graves disease and this as cause of tachycardia, has some questions about HF diagnosis. Remembers discussion of why radioactive iodine was used.   CC, SH/smoking status, and VS noted  Objective: BP 130/86 (BP Location: Right Arm, Patient Position: Sitting, Cuff Size: Large)   Pulse (!) 101   Temp 98.1 F (36.7 C) (Oral)   Ht 5\' 4"  (1.626 m)   Wt 252 lb 9.6 oz (114.6 kg)   LMP 01/31/2017 (Exact Date)   SpO2 98%   Breastfeeding? No   BMI 43.36 kg/m  Gen: NAD, alert, cooperative, and pleasant obese female.  HEENT: NCAT, EOMI, PERRL. R exophthalmos. CV: RRR, no murmur Resp: CTAB, no wheezes, non-labored Abd: SNTND, BS present, no guarding or organomegaly Ext: 1+ edema, warm Neuro: Alert and oriented, Speech clear, No gross deficits  Assessment and plan:  Graves disease Diagnosed in 2011 through [facility]. Followed with endo through [facility], TSH has been persistently low and T3 persistently elevated throughout her care there. Was on PTU, but was transitioned to methimazole during pregnancy. Radioactive iodine was discussed with past endo, but this was not chosen due to proximity of small children. At most recent admission, had been without her methimazole for almost 1 month and TSH was undetectable and T3 was 494. Methimazole was restarted at 15mg  TID. Patient had not been able to afford this since her discharge on 2/10. Medicaid application is pending, will place endocrinology referral when this is obtained. No concern for thyroid storm at present - patient has been tachycardic with exertion at home, but not  tachycardic (92 manual HR on my exam) today. Due to years of lack of control with medical therapy, I wonder whether this patient needs surgical thyroidectomy. This was discussed at last endo visit in Care Everywhere records review. In terms of medication, I have sent methimazole to [facility] outpatient pharmacy through the [facility] program.   Tachycardia Hx of betablocker due to persistent tachycardia secondary to poor control of Graves disease. Was transitioned from labetalol to coreg at last admission. Rewrote coreg to [facility] outpatient pharmacy for $3 through [facility] program. Not tachycardic on my exam, but feels tachycardic with minimal exertion at home.   Noncompliance Patient has had Graves disease for >7 years and has trouble obtaining medications, even when she was on Medicaid in the past. Part of this could certainly be transportation (could not afford cab to our clinic today or home). Patient receives [phone] for food stamps monthly (has 3 children currently in her care). Multiple ICU admissions with intubation do not seem to have changed her desire to obtain or take medications. Given high burden of disease along with young age and burden of young children at home, offered integrated [facility]. Patient declined due to time, but agreed to [facility] calling her after 2pm.   Dilated cardiomyopathy (HCC) Last EF 60-65% with mild LVH 10/2016. Follows with HF clinic, next appt accelerated to 2/27 due to recent admission. Patient states that people have told her that she has a normal heart and then also told her that she has HF. Was previously on losartan, spironolactone, lasix, and labetalol.  At most recent admission, BP was low and all were discontinued. Coreg 12.5mg  BID was started. Today, BP 144/78 with repeat 130s/70s. Restarted lasix 40mg  and continued coreg. 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 Will allow HF team to  titrate additional meds given compliance and affordability concerns. Weight after aggressive diuresis during ICU admission 10/2016 241. HF f/u appt weight 246. Recent admission with weight 280, although likely spurious as a bed weight. Weight today 252. Suggestive of likely mild fluid overload.  Birth control counseling Patient has been counseled by multiple specialities regarding the danger to her and fetus if she became pregnant. Feels she does not tolerate Depo (hair loss) and would not take this today. Interested in LARC, possibly Mirena, but unable to do this today due to time constraints. UPT negative (LMP 2/11, but no UPT in recent admission), and rx'd OCPs with intent to place LARC at follow up. Patient voiced understanding that pregnancy would be dangerous for her and states that she has not been sexually active due to feeling unwell with tachycardia.    Orders Placed This Encounter  Procedures  . POCT urine pregnancy    Meds ordered this encounter  Medications  . furosemide (LASIX) 40 MG tablet    Sig: Take 1 tablet (40 mg total) by mouth daily.    Dispense:  30 tablet    Refill:  1  . methimazole (TAPAZOLE) 5 MG tablet    Sig: Take 3 tablets (15 mg total) by mouth 3 (three) times daily.    Dispense:  270 tablet    Refill:  1  . carvedilol (COREG) 12.5 MG tablet    Sig: Take 1 tablet (12.5 mg total) by mouth 2 (two) times daily with a meal.    Dispense:  60 tablet    Refill:  0  . norgestimate-ethinyl estradiol (ORTHO-CYCLEN,SPRINTEC,PREVIFEM) 0.25-35 MG-MCG tablet    Sig: Take 1 tablet by mouth daily.    Dispense:  1 Package    Refill:  11     [name], MD, PGY1 01/31/2017 3:28 PM

## 2017-01-31 NOTE — Progress Notes (Signed)
     Patient is unable to afford prescriptions (methimazole and coreg) written today by Dr. Chanetta Marshall. Patient's MCD is pending. Spoke with Thayer Ohm at [facility] regarding assistance with obtaining these prescriptions. [facility] written in comments on e-Rx by provider and voucher filled out and turned into [place].  Kinnie Feil, RN, BSN

## 2017-01-31 NOTE — Assessment & Plan Note (Signed)
Diagnosed in 2011 through [facility]. Followed with endo through [facility], TSH has been persistently low and T3 persistently elevated throughout her care there. Was on PTU, but was transitioned to methimazole during pregnancy. Radioactive iodine was discussed with past endo, but this was not chosen due to proximity of small children. At most recent admission, had been without her methimazole for almost 1 month and TSH was undetectable and T3 was 494. Methimazole was restarted at 15mg  TID. Patient had not been able to afford this since her discharge on 2/10. Medicaid application is pending, will place endocrinology referral when this is obtained. No concern for thyroid storm at present - patient has been tachycardic with exertion at home, but not tachycardic (92 manual HR on my exam) today. Due to years of lack of control with medical therapy, I wonder whether this patient needs surgical thyroidectomy. This was discussed at last endo visit in Care Everywhere records review. In terms of medication, I have sent methimazole to [facility] outpatient pharmacy through the [facility] program.

## 2017-01-31 NOTE — Assessment & Plan Note (Signed)
Patient has been counseled by multiple specialities regarding the danger to her and fetus if she became pregnant. Feels she does not tolerate Depo (hair loss) and would not take this today. Interested in LARC, possibly Mirena, but unable to do this today due to time constraints. UPT negative (LMP 2/11, but no UPT in recent admission), and rx'd OCPs with intent to place LARC at follow up. Patient voiced understanding that pregnancy would be dangerous for her and states that she has not been sexually active due to feeling unwell with tachycardia.

## 2017-01-31 NOTE — Assessment & Plan Note (Addendum)
Last EF 60-65% with mild LVH 10/2016. Follows with HF clinic, next appt accelerated to 2/27 due to recent admission. Patient states that people have told her that she has a normal heart and then also told her that she has HF. Was previously on losartan, spironolactone, lasix, and labetalol. At most recent admission, BP was low and all were discontinued. Coreg 12.5mg  BID was started. Today, BP 144/78 with repeat 130s/70s. Restarted lasix 40mg  and continued coreg. 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 Will allow HF team to titrate additional meds given compliance and affordability concerns. Weight after aggressive diuresis during ICU admission 10/2016 241. HF f/u appt weight 246. Recent admission with weight 280, although likely spurious as a bed weight. Weight today 252. Suggestive of likely mild fluid overload.

## 2017-01-31 NOTE — Telephone Encounter (Addendum)
Transitional of Care Post-Discharge Follow-Up: Done while patient in office for TOC  Admit date: 01/27/2017 Discharge date: 01/28/2017  Discharge Disposition: Home  Best patient contact number: [phone] Emergency contact(s): Asencion Gowda [facility]) [phone] PCP: Chanetta Marshall  Principal Discharge Diagnosis: Graves Disease, SVT  Reason for Chronic Case Management: Co-morbidities as follows:  Tachycardia, HTN, Heart Failure; 5 ED and 3 admissions in 6 months   Post-discharge Communication: (Clearly document all attempts clearly and date contact made)   Call Completed: Yes, with patient   Interpreter Needed: No   Please check all that apply:  X Patient is caring for self at home. "My son's father helps" ? Patient has caregiver. If so, name and best contact number:  ? Patient is knowledgeable of his/her condition(s) and/or treatment. No, patient is unsure if she has heart failure. PCP made aware prior to appt. ? Family and/or caregiver is knowledgeable of patient's condition(s) and/or treatment.   Medication Reconciliation:  X Medication list reviewed with patient. Patient will be getting meds through [facility] at [facility] ($3 per Rx) ? Patient has all discharge medications Patient has very limited ability to pay for meds. as above ? Patient has O2/CPAP ordered? If so, name of company that supplies:  Activities of Daily Living:  X Independent  ? Needs assist (describe)  ? Total Care (describe)   Community resources in place for patient:  X None  ? Home Health If so, name of agency: ? [facility] If so, name of Care Manager and contact number:   ? Assisted Living  ? Hospice  ? Support Group    Topics discussed:  Home Environment: Patient lives with son's father and her 3 children ages 65, 20 and 5 months in second floor apt with handrails.   Support System: Patient's cousin. Of note patient does not list son's father who lives with her. PCP made aware  Home DME: Ordered at  discharge? Does patient have? None  Transportation: Barriers? Patient relies on cabs to get to appts. Was unable to afford taxi to return home after OV. This was paid by [facility] Indigent fund.  Food/Nutrition: (ability to afford, access, use of any community resources) Denies food insecurity. States she receives $618 in food stamps per month  Would patient benefit from consult with Social Work? Behavioral Health? Pharm D? Yes, with LCSW for transportation issues and inability to afford meds. Patient's medicaid is pending. Also with [facility] for previous non-adherence with meds.  Identified Barriers: Transportation and inability to afford meds as above           Collaborated with in-house [employer] and message sent to in-house LCSW as above  L. Leward Quan, RN, BSN

## 2017-01-31 NOTE — Assessment & Plan Note (Addendum)
Patient has had Graves disease for >7 years and has trouble obtaining medications, even when she was on Medicaid in the past. Part of this could certainly be transportation (could not afford cab to our clinic today or home). Patient receives [phone] for food stamps monthly (has 3 children currently in her care). Multiple ICU admissions with intubation do not seem to have changed her desire to obtain or take medications. Given high burden of disease along with young age and burden of young children at home, offered integrated [facility]. Patient declined due to time, but agreed to [facility] calling her after 2pm.

## 2017-01-31 NOTE — Telephone Encounter (Signed)
[facility] not written on e-scripts. Meds processed through [facility]. Patient paid $3 per script without assistance from [facility]. Kinnie Feil, RN, BSN

## 2017-02-01 ENCOUNTER — Encounter: Payer: Self-pay | Admitting: Licensed Clinical Social Worker

## 2017-02-01 NOTE — Progress Notes (Signed)
Social work consult from [employer], Patient has transportation barriers and inability to afford medications.    Patient has food stamps & Medicaid is pending. Patient is scheduled to return to [facility] on 02/16/17.  Patient seen by Jefm Miles at [facility].  Havery Moros for coordination of resources & care. Patient has an appointment 02/15/17 at [facility]. Annice Pih will assess for barriers at 2/27 appointment and will coordinate with this LCSW for patient's needs.    Plan:   1. LCSW will speak to [place] on 02/15/17 ref. coordination of resources and care. 2. LCSW will call patient to assess for barriers to 02/16/17 appointment if needed.   Sammuel Hines, LCSW Licensed Clinical Social Worker [facility]   [phone] 10:40 AM

## 2017-02-11 ENCOUNTER — Encounter: Payer: Self-pay | Admitting: Internal Medicine

## 2017-02-11 ENCOUNTER — Ambulatory Visit (INDEPENDENT_AMBULATORY_CARE_PROVIDER_SITE_OTHER): Payer: Self-pay | Admitting: Internal Medicine

## 2017-02-11 DIAGNOSIS — R059 Cough, unspecified: Secondary | ICD-10-CM

## 2017-02-11 DIAGNOSIS — R05 Cough: Secondary | ICD-10-CM

## 2017-02-11 MED ORDER — BENZONATATE 100 MG PO CAPS: 100.0000 mg | ORAL_CAPSULE | Freq: Two times a day (BID) | ORAL | 0 refills | Status: DC | PRN

## 2017-02-11 NOTE — Patient Instructions (Addendum)
It was nice meeting you today [name]!  For your cough, you can begin taking one Tessalon perle up to every 8 hours as needed for coughing. Also, please buy Mucinex at the pharmacy and take as directed. You can also try eating a spoonful of honey, either alone or mixed into a warm beverage, 3-4 times per day. Standing in a bathroom with a warm shower running can also help break up your congestion.   If you begin having trouble breathing, or start to have fevers or chills, please call our office.   If you have any questions or concerns, please feel free to call the clinic.   Be well,  Dr. Natale Milch   Cough, Adult Introduction A cough helps to clear your throat and lungs. A cough may last only 2-3 weeks (acute), or it may last longer than 8 weeks (chronic). Many different things can cause a cough. A cough may be a sign of an illness or another medical condition. Follow these instructions at home:  Pay attention to any changes in your cough.  Take medicines only as told by your doctor.  If you were prescribed an antibiotic medicine, take it as told by your doctor. Do not stop taking it even if you start to feel better.  Talk with your doctor before you try using a cough medicine.  Drink enough fluid to keep your pee (urine) clear or pale yellow.  If the air is dry, use a cold steam vaporizer or humidifier in your home.  Stay away from things that make you cough at work or at home.  If your cough is worse at night, try using extra pillows to raise your head up higher while you sleep.  Do not smoke, and try not to be around smoke. If you need help quitting, ask your doctor.  Do not have caffeine.  Do not drink alcohol.  Rest as needed. Contact a doctor if:  You have new problems (symptoms).  You cough up yellow fluid (pus).  Your cough does not get better after 2-3 weeks, or your cough gets worse.  Medicine does not help your cough and you are not sleeping well.  You  have pain that gets worse or pain that is not helped with medicine.  You have a fever.  You are losing weight and you do not know why.  You have night sweats. Get help right away if:  You cough up blood.  You have trouble breathing.  Your heartbeat is very fast. This information is not intended to replace advice given to you by your health care provider. Make sure you discuss any questions you have with your health care provider. Document Released: 08/19/2011 Document Revised: 05/13/2016 Document Reviewed: 02/12/2015  2017 Elsevier

## 2017-02-11 NOTE — Assessment & Plan Note (Signed)
Exam most consistent with viral etiology. Lungs CTAB without wheezes or crackles. Normal WOB on RA and not in respiratory distress. Minimal coughing during encounter. Given reassuring lung exam and no reported shortness of breath, will treat symptomatically for now.  - Tessalon perles TID PRN - Mucinex - Patient told to call office of develops fevers/chills or difficulty breathing

## 2017-02-11 NOTE — Progress Notes (Signed)
   Subjective:   Patient: Tara [name]       Birthdate: 1991/10/04       MRN: [id number]      HPI  Tara [name] is a 26 y.o. female presenting for same day visit for cough.   Patient reports cough began two weeks ago. It is production of yellow sputum. She primarily produces sputum when walking or exerting herself. Denies fevers, chills, shortness of breath, nasal congestion, sore throat. Has no history of asthma or breathing issues. Denies sick contacts, but does work at [employer] and is around a lot of people. Patient also have young children. Has tried Nyquil but said this did not help.   Of note, patient has not picked up OCPs and does not intend to as she says she cannot afford this. She also has not picked up her methimazole because she said she was not able to get to the pharmacy. She reports she will walk to the [facility] as soon as she leaves her appointment today to pick up methimazole.   Smoking status reviewed. Patient is never smoker.   Review of Systems See HPI.     Objective:  Physical Exam  Constitutional: She is oriented to person, place, and time and well-developed, well-nourished, and in no distress.  HENT:  Head: Normocephalic and atraumatic.  Mouth/Throat: Oropharynx is clear and moist. No oropharyngeal exudate.  Exophthalmos present on R  Cardiovascular: Normal rate, regular rhythm and normal heart sounds.   No murmur heard. Pulmonary/Chest:  Lungs CTAB. No wheezes, crackles. Normal WOB on RA. Able to speak in full sentences. No respiratory distress. Coughed once during encounter.   Neurological: She is alert and oriented to person, place, and time. Gait normal.  Psychiatric: Affect and judgment normal.    Assessment & Plan:  Cough Exam most consistent with viral etiology. Lungs CTAB without wheezes or crackles. Normal WOB on RA and not in respiratory distress. Minimal coughing during encounter. Given reassuring lung exam and no reported  shortness of breath, will treat symptomatically for now.  - Tessalon perles TID PRN - Mucinex - Patient told to call office of develops fevers/chills or difficulty breathing  Tarri Abernethy, MD, MPH PGY-2 [facility] Pager [phone]

## 2017-02-15 ENCOUNTER — Inpatient Hospital Stay (HOSPITAL_COMMUNITY): Admit: 2017-02-15 | Payer: Self-pay

## 2017-02-15 ENCOUNTER — Ambulatory Visit: Payer: Self-pay | Admitting: Family Medicine

## 2017-02-16 ENCOUNTER — Ambulatory Visit: Payer: Self-pay | Admitting: Family Medicine

## 2017-02-16 ENCOUNTER — Telehealth: Payer: Self-pay | Admitting: Licensed Clinical Social Worker

## 2017-02-16 NOTE — Telephone Encounter (Signed)
CSW informed that patient cancelled yesterday's clinic appointment due to lack of transportation and concerns of financial hardship due to multiple copays for MD visits. CSW contacted patient to offer bus passes and discuss further options for assistance. CSW left message for return call. [name], LCSW, CCSW-MCS [phone]

## 2017-02-18 ENCOUNTER — Encounter: Payer: Self-pay | Admitting: Licensed Clinical Social Worker

## 2017-02-18 NOTE — Progress Notes (Signed)
[facility] (see Annice Pih LCSW note 02/16/17).  Patient also canceled [facility] appointment on 02/16/16.  Annice Pih at [facility] has offered to mail patient bus passes or to pick them up for her appointments, as Transportation has been a barrier in the past. Plan: LCSW will continue coordination of resources with LCSW at [facility] to assist with removing barriers to appointments.  Sammuel Hines, LCSW Licensed Clinical Social Worker [facility]   [phone] 11:02 AM

## 2017-02-23 ENCOUNTER — Encounter (HOSPITAL_COMMUNITY): Payer: Self-pay

## 2017-03-07 ENCOUNTER — Encounter (HOSPITAL_COMMUNITY): Payer: Medicaid Other

## 2017-03-08 ENCOUNTER — Encounter (HOSPITAL_COMMUNITY): Payer: Self-pay

## 2017-03-08 ENCOUNTER — Ambulatory Visit (HOSPITAL_COMMUNITY)
Admission: RE | Admit: 2017-03-08 | Discharge: 2017-03-08 | Disposition: A | Discharge status: Home / Self Care | Payer: Medicaid Other | Source: Ambulatory Visit | Attending: Cardiology | Admitting: Cardiology

## 2017-03-08 VITALS — BP 134/66 | HR 101 | Wt 267.4 lb

## 2017-03-08 DIAGNOSIS — I5032 Chronic diastolic (congestive) heart failure: Secondary | ICD-10-CM | POA: Diagnosis not present

## 2017-03-08 DIAGNOSIS — F419 Anxiety disorder, unspecified: Secondary | ICD-10-CM | POA: Insufficient documentation

## 2017-03-08 DIAGNOSIS — Z6841 Body Mass Index (BMI) 40.0 and over, adult: Secondary | ICD-10-CM | POA: Insufficient documentation

## 2017-03-08 DIAGNOSIS — R0600 Dyspnea, unspecified: Secondary | ICD-10-CM | POA: Insufficient documentation

## 2017-03-08 DIAGNOSIS — I110 Hypertensive heart disease with heart failure: Secondary | ICD-10-CM | POA: Insufficient documentation

## 2017-03-08 DIAGNOSIS — E0500 Thyrotoxicosis with diffuse goiter without thyrotoxic crisis or storm: Secondary | ICD-10-CM | POA: Diagnosis not present

## 2017-03-08 DIAGNOSIS — Z79899 Other long term (current) drug therapy: Secondary | ICD-10-CM | POA: Insufficient documentation

## 2017-03-08 DIAGNOSIS — E669 Obesity, unspecified: Secondary | ICD-10-CM | POA: Diagnosis not present

## 2017-03-08 DIAGNOSIS — Z91199 Patient's noncompliance with other medical treatment and regimen due to unspecified reason: Secondary | ICD-10-CM

## 2017-03-08 DIAGNOSIS — I420 Dilated cardiomyopathy: Secondary | ICD-10-CM | POA: Diagnosis present

## 2017-03-08 DIAGNOSIS — Z9119 Patient's noncompliance with other medical treatment and regimen: Secondary | ICD-10-CM

## 2017-03-08 DIAGNOSIS — I252 Old myocardial infarction: Secondary | ICD-10-CM | POA: Insufficient documentation

## 2017-03-08 DIAGNOSIS — Z8701 Personal history of pneumonia (recurrent): Secondary | ICD-10-CM | POA: Diagnosis not present

## 2017-03-08 LAB — CBC
HEMATOCRIT: 39.6 % (ref 36.0–46.0)
Hemoglobin: 12.6 g/dL (ref 12.0–15.0)
MCH: 22.8 pg — ABNORMAL LOW (ref 26.0–34.0)
MCHC: 31.8 g/dL (ref 30.0–36.0)
MCV: 71.7 fL — AB (ref 78.0–100.0)
Platelets: 276 10*3/uL (ref 150–400)
RBC: 5.52 MIL/uL — ABNORMAL HIGH (ref 3.87–5.11)
RDW: 15.2 % (ref 11.5–15.5)
WBC: 9.2 10*3/uL (ref 4.0–10.5)

## 2017-03-08 LAB — T4, FREE: Free T4: 2.35 ng/dL — ABNORMAL HIGH (ref 0.61–1.12)

## 2017-03-08 LAB — BASIC METABOLIC PANEL
Anion gap: 8 (ref 5–15)
BUN: 7 mg/dL (ref 6–20)
CHLORIDE: 106 mmol/L (ref 101–111)
CO2: 23 mmol/L (ref 22–32)
CREATININE: 0.52 mg/dL (ref 0.44–1.00)
Calcium: 9.4 mg/dL (ref 8.9–10.3)
GFR calc non Af Amer: >60 mL/min (ref >60)
Glucose, Bld: 98 mg/dL (ref 65–99)
Potassium: 4 mmol/L (ref 3.5–5.1)
Sodium: 137 mmol/L (ref 135–145)

## 2017-03-08 LAB — BRAIN NATRIURETIC PEPTIDE: B Natriuretic Peptide: 83.3 pg/mL (ref 0.0–100.0)

## 2017-03-08 LAB — TSH: TSH: <0.01 u[IU]/mL — ABNORMAL LOW (ref 0.350–4.500)

## 2017-03-08 MED ORDER — FUROSEMIDE 40 MG PO TABS: 80.0000 mg | ORAL_TABLET | Freq: Every day | ORAL | 1 refills | Status: DC

## 2017-03-08 MED FILL — FUROSEMIDE 40 MG TABLET: 40 | 32 days supply | Qty: 65 | Fill #0

## 2017-03-08 NOTE — Progress Notes (Signed)
SW Intern met with pt. for medicaid follow up. Pt indicated a medicaid app was done for her by a financial counsler when she was inpatient,but hasn't heard anything and "its been way far over 45 days" patient was visably irritated and stated that she feels like "no one here cares about me" I asked pt if she had made contact with [employer]. Pt indicated she had not been in contact because they "put her on hold to long" SW Intern advised pt to go to [employer] in person to talk to a caseworker about her medicaid application. Pt verbalized understanding and indicated she would try to contact social worker at [employer] as soon as possible and call clinic with results. [place] Intern Chambers, [place] [phone]

## 2017-03-08 NOTE — Progress Notes (Addendum)
Advanced Heart Failure Clinic Note   PCP: None  Primary Cardiologist: Dr [name]  HPI: Tara Slotnick [name] a 26 y.o.femalewith history of graves diease, HTN, s/p C-section 07/2016 complicated by post-partum HCAP, pregnancy complicated by pre-eclampsia.   Treated for CAP in ED 10/24/16 and sent home. Returned to ED and admitted 10/25/16 with acute respiratory failure with hypoxia. Intubated early morning 10/27/16 with pulmonary edema and anxiety. Later extubated to room air. Hospital course complicated by hypernatremia. Diuresed ~30 pounds. Discharge weight 241 pounds.   Admitted 2/8 -> 01/28/17 with SVT. Thought likely secondary to her poorly controlled Grave's disease. Had been out of her thyroid and BP medications for 2-3 weeks.  Pt presents today for follow up. Last seen 11/2016. Weight up 21 lbs from that visit. Hasn't been feeling well recently.  SOB with minimal exertion including walking up steps or walking around the house. Has been out of lasix for three days. Having orthopnea and cough as well. Has felt like this for at 1 month.  Takes her a few minutes to catch her breath. Eats a lot of fast food, Works at [employer], tries to take her own lunch (but only over past week).   SH: Lives with boyfriend and infant son.  Does not drink or smoke   Review of systems complete and found to be negative unless listed in HPI.   SH:  Social History   Social History  . Marital status: Single    Spouse name: N/A  . Number of children: N/A  . Years of education: N/A   Occupational History  . Not on file.   Social History Main Topics  . Smoking status: Never Smoker  . Smokeless tobacco: Never Used  . Alcohol use No  . Drug use: No  . Sexual activity: Not Currently   Other Topics Concern  . Not on file   Social History Narrative  . No narrative on file    FH: No family history on file.  Past Medical History:  Diagnosis Date  . Anemia   . CAP (community acquired pneumonia)  02/13/2016  . Chronic bronchitis (HCC)   . Graves disease   . Graves disease   . Headache   . Hypertension   . Myocardial infarction 2013  . Obesity   . Pneumonia "several times"    Current Outpatient Prescriptions  Medication Sig Dispense Refill  . carvedilol (COREG) 12.5 MG tablet Take 1 tablet (12.5 mg total) by mouth 2 (two) times daily with a meal. 60 tablet 0  . furosemide (LASIX) 40 MG tablet Take 1 tablet (40 mg total) by mouth daily. 30 tablet 1  . methimazole (TAPAZOLE) 5 MG tablet Take 3 tablets (15 mg total) by mouth 3 (three) times daily. 270 tablet 1  . norgestimate-ethinyl estradiol (ORTHO-CYCLEN,SPRINTEC,PREVIFEM) 0.25-35 MG-MCG tablet Take 1 tablet by mouth daily. 1 Package 11  . benzonatate (TESSALON) 100 MG capsule Take 1 capsule (100 mg total) by mouth 2 (two) times daily as needed for cough. (Patient not taking: Reported on 03/08/2017) 20 capsule 0   No current facility-administered medications for this encounter.     Vitals:   03/08/17 1348 03/08/17 1353  BP: 134/66 134/66  Pulse: (!) 101 (!) 101  SpO2: 97% 97%  Weight: 267 lb 6.4 oz (121.3 kg) 267 lb 6.4 oz (121.3 kg)   Wt Readings from Last 3 Encounters:  03/08/17 267 lb 6.4 oz (121.3 kg)  02/11/17 255 lb (115.7 kg)  01/31/17 252 lb 9.6 oz (114.6  kg)    PHYSICAL EXAM:  General:  Fatigued appearing. NAD.   HEENT: Normal except for severe exopthalmos Neck: Supple. JVP difficult, but at least 9-10 cm. Carotids 2+ bilaterally; no bruits. No lymphadenopathy. Significant full thyromegaly.  Cor: PMI normal. Regular rate. Slightly tachy. NO M/G/R noted.  Lungs: Diminished basilar sounds. Slightly dyspneic at rest.  Abdomen: Obese, soft, NT, ND, no HSM. No bruits or masses. +BS  Extremities: no cyanosis, clubbing, rash, Trace ankle edema.  Neuro: alert & orientedx3, cranial nerves grossly intact. Moves all 4 extremities w/o difficulty. Affect flat but appropriate.  EKG:  NSR 95 bpm, IRBBB   ASSESSMENT &  PLAN: Tara [name] a 26 y.o.femalewith history of graves diease, HTN, s/p C-section 07/2016 complicated by post-partum HCAP, pregnancy complicated by pre-eclampsia. Treated for CAP in ED 10/24/16 and sent home. Returned to ED and admitted 10/25/16 with acute respiratory failure with hypoxia.   1. Chronic  diastolic CHF: Echo 10/26/16 LVEF 60-65%, Mild AS, mild LAE, Mild TI, PA peak pressure 54 mmHg, normal RV.  ? Etiology. Could be do to poorly controlled hyperthyroidism vs undiagnosed OSA/OHS with body habitus vs poorly controlled HTN,  - Volume status elevated on exam. Suspect due to medical and dietary non-compliance - Take lasix 80 mg BID x 2 days and then decrease to new chronic dose of 80 mg daily. Labs today.  - Continue 25 mg spironolactone daily.  - Pt aware of the need of preventing further pregnancies.    -Reinforced fluid restriction to < 2 L daily, sodium restriction to less than 2000 mg daily, and the importance of daily weights.     2. Graves disease: - Poorly controlled. Very much needs Thyroid ablation, but has not been able to afford without medication. - Will have HFSW see today.  - Will check TFTs today.    3. Severe anxiety 4. H/O ARF:  - BMET today. Watch closely with extra diuretics.  5. HTN - Mildly elevated in clinic. Will not adjust meds at this time with diuresis.   6. Social - HFSW to see to help with insurance.   Pt orthopneic and dyspneic with NYHAIIIb-IV symptoms. Suspect somewhat due to thyroid disease, but also appears volume overloaded, with 12 lb weight gain in past month, and > 20 lbs since last clinic visit.   BMET, TFTs, BNP, and CBC today.  Follow up 2 weeks with medication adjustment.   Graciella Freer, PA-C  2:07 PM  Greater than 50% of the 25 minute visit was spent in counseling/coordination of care regarding disease state education, discussion with pharmacy and social work, and medication reconciliation.

## 2017-03-08 NOTE — Patient Instructions (Signed)
Routine lab work today. Will notify you of abnormal results, otherwise no news is good news!  Increase Lasix to 80 mg TWICE DAILY for TWO DAYS. Then reduce dose to 80 mg ONCE daily.  Follow up 2 weeks with Otilio Saber PA-C.  Do the following things EVERYDAY: 1) Weigh yourself in the morning before breakfast. Write it down and keep it in a log. 2) Take your medicines as prescribed 3) Eat low salt foods-Limit salt (sodium) to 2000 mg per day.  4) Stay as active as you can everyday 5) Limit all fluids for the day to less than 2 liters

## 2017-03-09 LAB — T3, FREE: T3 FREE: 8.2 pg/mL — AB (ref 2.0–4.4)

## 2017-03-23 ENCOUNTER — Encounter (HOSPITAL_COMMUNITY): Payer: Self-pay

## 2017-03-24 ENCOUNTER — Telehealth: Payer: Self-pay | Admitting: Licensed Clinical Social Worker

## 2017-03-24 NOTE — Telephone Encounter (Signed)
CSW referred to assist patient as she has had multiple no shows and cancelled appointments. Patient reports she was denied for medicaid and "don't want to have a lot of medical bills". CSW discussed application for the [employer] which will help with medical coverage and expenses. Patient agreeable to make application at Social Services on 03/29/17. CSW will follow up with patient after 4/10 to assist further as needed. [name], LCSW, CCSW-MCS [phone]

## 2017-03-29 ENCOUNTER — Emergency Department (HOSPITAL_COMMUNITY): Payer: Medicaid Other

## 2017-03-29 ENCOUNTER — Encounter (HOSPITAL_COMMUNITY): Payer: Self-pay | Admitting: Emergency Medicine

## 2017-03-29 ENCOUNTER — Emergency Department (HOSPITAL_COMMUNITY)
Admission: EM | Admit: 2017-03-29 | Discharge: 2017-03-29 | Disposition: A | Discharge status: Home / Self Care | Payer: Medicaid Other | Attending: Emergency Medicine | Admitting: Emergency Medicine

## 2017-03-29 DIAGNOSIS — I252 Old myocardial infarction: Secondary | ICD-10-CM | POA: Diagnosis not present

## 2017-03-29 DIAGNOSIS — Z79899 Other long term (current) drug therapy: Secondary | ICD-10-CM | POA: Insufficient documentation

## 2017-03-29 DIAGNOSIS — R102 Pelvic and perineal pain: Secondary | ICD-10-CM | POA: Diagnosis not present

## 2017-03-29 DIAGNOSIS — I110 Hypertensive heart disease with heart failure: Secondary | ICD-10-CM | POA: Diagnosis not present

## 2017-03-29 DIAGNOSIS — Z3A01 Less than 8 weeks gestation of pregnancy: Secondary | ICD-10-CM | POA: Diagnosis not present

## 2017-03-29 DIAGNOSIS — I5032 Chronic diastolic (congestive) heart failure: Secondary | ICD-10-CM | POA: Diagnosis not present

## 2017-03-29 DIAGNOSIS — Z3490 Encounter for supervision of normal pregnancy, unspecified, unspecified trimester: Secondary | ICD-10-CM

## 2017-03-29 DIAGNOSIS — R52 Pain, unspecified: Secondary | ICD-10-CM

## 2017-03-29 DIAGNOSIS — O26891 Other specified pregnancy related conditions, first trimester: Secondary | ICD-10-CM | POA: Insufficient documentation

## 2017-03-29 LAB — CBC
HEMATOCRIT: 38.2 % (ref 36.0–46.0)
Hemoglobin: 12.3 g/dL (ref 12.0–15.0)
MCH: 22.8 pg — ABNORMAL LOW (ref 26.0–34.0)
MCHC: 32.2 g/dL (ref 30.0–36.0)
MCV: 70.7 fL — AB (ref 78.0–100.0)
PLATELETS: 282 10*3/uL (ref 150–400)
RBC: 5.4 MIL/uL — AB (ref 3.87–5.11)
RDW: 14.9 % (ref 11.5–15.5)
WBC: 8.1 10*3/uL (ref 4.0–10.5)

## 2017-03-29 LAB — HCG, QUANTITATIVE, PREGNANCY: hCG, Beta Chain, Quant, S: 38212 m[IU]/mL — ABNORMAL HIGH (ref <5)

## 2017-03-29 LAB — COMPREHENSIVE METABOLIC PANEL
ALT: 10 U/L — AB (ref 14–54)
AST: 17 U/L (ref 15–41)
Albumin: 3.4 g/dL — ABNORMAL LOW (ref 3.5–5.0)
Alkaline Phosphatase: 88 U/L (ref 38–126)
Anion gap: 10 (ref 5–15)
BUN: 7 mg/dL (ref 6–20)
CHLORIDE: 101 mmol/L (ref 101–111)
CO2: 24 mmol/L (ref 22–32)
CREATININE: 0.77 mg/dL (ref 0.44–1.00)
Calcium: 8.5 mg/dL — ABNORMAL LOW (ref 8.9–10.3)
GFR calc non Af Amer: >60 mL/min (ref >60)
Glucose, Bld: 77 mg/dL (ref 65–99)
POTASSIUM: 3.4 mmol/L — AB (ref 3.5–5.1)
SODIUM: 135 mmol/L (ref 135–145)
Total Bilirubin: 0.4 mg/dL (ref 0.3–1.2)
Total Protein: 6.9 g/dL (ref 6.5–8.1)

## 2017-03-29 LAB — URINALYSIS, ROUTINE W REFLEX MICROSCOPIC
BACTERIA UA: NONE SEEN
Bilirubin Urine: NEGATIVE
GLUCOSE, UA: NEGATIVE mg/dL
Hgb urine dipstick: NEGATIVE
KETONES UR: NEGATIVE mg/dL
Leukocytes, UA: NEGATIVE
Nitrite: NEGATIVE
Protein, ur: 100 mg/dL — AB
Specific Gravity, Urine: 1.012 (ref 1.005–1.030)
pH: 7 (ref 5.0–8.0)

## 2017-03-29 LAB — I-STAT BETA HCG BLOOD, ED (MC, WL, AP ONLY): I-stat hCG, quantitative: >2000 m[IU]/mL — ABNORMAL HIGH (ref <5)

## 2017-03-29 LAB — LIPASE, BLOOD: LIPASE: 10 U/L — AB (ref 11–51)

## 2017-03-29 NOTE — ED Triage Notes (Signed)
Pt states she has been having abdominal pain/n/v/d x 1 day.

## 2017-03-29 NOTE — Discharge Instructions (Addendum)
Your pregnancy test is positive. And the ultrasound shows that the pregnancy is in your uterus. Please follow up with an obstetrician to determine the next steps.

## 2017-03-29 NOTE — ED Notes (Signed)
Patient transported to Ultrasound 

## 2017-03-29 NOTE — ED Provider Notes (Signed)
[facility]-EMERGENCY DEPT Provider Note   CSN: [id number] Arrival date & time: 03/29/17  7948     History   Chief Complaint Chief Complaint  Patient presents with  . Abdominal Pain  . Emesis    HPI Tara [name] is a 26 y.o. female.  Completed past medical history of diastolic heart failure, poorly controlled hyperthyroidism secondary to Graves' disease, and prior pregnancy, preeclampsia who presents with 1 week of right lower quadrant intermittent abdominal pain and 3 episodes of loose stool again last night. Patient reports that her abdominal pain "feels like something is full out of my body". She denies any sharp or cramping pain. She denies any radiation. She reports the pain lasted 5-10 minutes during each episode seems to resolve with rest, is not relieved by Tylenol. These episodes occur several times a day over the last week. Last night she began to have loose stools. She reports that she had 3 episodes overnight last several hours ago. She denies any blood in her stool. Patient had mild nausea, no vomiting. She denies any urinary symptoms. No fevers, chills, chest pain. Patient does endorse a chronic cough which has worsened over the last several weeks, but denies other worsening or shortness of breath. She is at her baseline activity level for her CHF patient denies any lower extremity swelling. Patient also reports good compliance with her medications recently, but has a history of poor compliance recently which resulted in multiple complications of her heart failure and hyperthyroidism.  On arrival to the ED patient's triage labs included urine pregnancy test which was positive at greater than 2000 hCG. Patient was notified of this test and is extremely concerned. This pregnancy was unplanned, unwanted, and prior notes from her cardiologist have remarked that future pregnancies would be very dangerous to her. Patient wishes to discuss termination.      Past Medical History:    Diagnosis Date  . Anemia   . CAP (community acquired pneumonia) 02/13/2016  . Chronic bronchitis (HCC)   . Graves disease   . Graves disease   . Headache   . Hypertension   . Myocardial infarction 2013  . Obesity   . Pneumonia "several times"    Patient Active Problem List   Diagnosis Date Noted  . Chronic diastolic heart failure (HCC) 03/08/2017  . Cough 02/11/2017  . Birth control counseling 01/31/2017  . Dilated cardiomyopathy (HCC)   . Pulmonary hypertension   . Tachycardia   . Noncompliance   . Graves disease 02/11/2016  . Morbid obesity due to excess calories [facility])     Past Surgical History:  Procedure Laterality Date  . CESAREAN SECTION    . CESAREAN SECTION N/A 07/21/2016   Procedure: CESAREAN SECTION;  Surgeon: Lesly Dukes, MD;  Location: [facility] BIRTHING SUITES;  Service: Obstetrics;  Laterality: N/A;  . EYE SURGERY Bilateral 2014   "for Graves Disease; eyelid retraction on left; decompression on the right""    OB History    Gravida Para Term Preterm AB Living   5 5 4 1  0 5   SAB TAB Ectopic Multiple Live Births   0 0 0 0 4       Home Medications    Prior to Admission medications   Medication Sig Start Date End Date Taking? Authorizing Provider  carvedilol (COREG) 12.5 MG tablet Take 1 tablet (12.5 mg total) by mouth 2 (two) times daily with a meal. 01/31/17  Yes Garth Bigness, MD  furosemide (LASIX) 40 MG tablet Take  2 tablets (80 mg total) by mouth daily. 03/08/17  Yes Graciella Freer, PA-C  methimazole (TAPAZOLE) 5 MG tablet Take 3 tablets (15 mg total) by mouth 3 (three) times daily. 01/31/17  Yes Garth Bigness, MD  benzonatate (TESSALON) 100 MG capsule Take 1 capsule (100 mg total) by mouth 2 (two) times daily as needed for cough. Patient not taking: Reported on 03/08/2017 02/11/17   Marquette Saa, MD  norgestimate-ethinyl estradiol (ORTHO-CYCLEN,SPRINTEC,PREVIFEM) 0.25-35 MG-MCG tablet Take 1 tablet by mouth daily. Patient not  taking: Reported on 03/29/2017 01/31/17   Garth Bigness, MD    Family History No family history on file.  Social History Social History  Substance Use Topics  . Smoking status: Never Smoker  . Smokeless tobacco: Never Used  . Alcohol use No     Allergies   Iodine; Shellfish allergy; and Tape   Review of Systems Review of Systems  Constitutional: Negative for chills, diaphoresis, fever and unexpected weight change.  HENT: Negative for congestion and sore throat.   Eyes: Negative for pain and visual disturbance.  Respiratory: Positive for cough. Negative for shortness of breath and wheezing.   Cardiovascular: Negative for chest pain, palpitations and leg swelling.  Gastrointestinal: Positive for abdominal pain, diarrhea and nausea. Negative for blood in stool, constipation, rectal pain and vomiting.  Genitourinary: Negative for difficulty urinating, dysuria, flank pain, hematuria, vaginal bleeding, vaginal discharge and vaginal pain.  Musculoskeletal: Negative for arthralgias and back pain.  Skin: Negative for color change and rash.  Neurological: Negative for dizziness, seizures and syncope.  All other systems reviewed and are negative.    Physical Exam Updated Vital Signs BP 118/72   Pulse 81   Temp 97.8 F (36.6 C) (Oral)   Resp 16   Ht 5\' 6"  (1.676 m)   Wt 119.7 kg   SpO2 97%   BMI 42.61 kg/m   Physical Exam  Constitutional: She is oriented to person, place, and time. She appears well-developed and well-nourished. No distress.  Obese female lying in the stretcher in NAD, txting on mobile phone.  HENT:  Head: Normocephalic and atraumatic.  Eyes: Conjunctivae are normal.  Marked proptosis of bilateral orbits  Neck: Neck supple.  Cardiovascular: Normal rate and regular rhythm.   No murmur heard. Pulmonary/Chest: Effort normal and breath sounds normal. No respiratory distress.  Abdominal: Soft. Bowel sounds are normal. She exhibits no distension. There is  no tenderness.  Belly is obese but non-tender to deep palpation. Lankmarks difficult to assess but uterus not palpated below the pubis.  Musculoskeletal: She exhibits no edema.  Neurological: She is alert and oriented to person, place, and time.  Skin: Skin is warm and dry.  Psychiatric: She has a normal mood and affect.  Nursing note and vitals reviewed.    ED Treatments / Results  Labs (all labs ordered are listed, but only abnormal results are displayed) Labs Reviewed  LIPASE, BLOOD - Abnormal; Notable for the following:       Result Value   Lipase 10 (*)    All other components within normal limits  COMPREHENSIVE METABOLIC PANEL - Abnormal; Notable for the following:    Potassium 3.4 (*)    Calcium 8.5 (*)    Albumin 3.4 (*)    ALT 10 (*)    All other components within normal limits  CBC - Abnormal; Notable for the following:    RBC 5.40 (*)    MCV 70.7 (*)    MCH 22.8 (*)  All other components within normal limits  URINALYSIS, ROUTINE W REFLEX MICROSCOPIC - Abnormal; Notable for the following:    Protein, ur 100 (*)    Squamous Epithelial / LPF 0-5 (*)    All other components within normal limits  I-STAT BETA HCG BLOOD, ED ([facility], [facility], [facility] ONLY) - Abnormal; Notable for the following:    I-stat hCG, quantitative >2,000.0 (*)    All other components within normal limits  HCG, QUANTITATIVE, PREGNANCY  T4, FREE  T3, FREE    EKG  EKG Interpretation None       Radiology No results found.  Procedures Procedures (including critical care time)  Medications Ordered in ED Medications - No data to display   Initial Impression / Assessment and Plan / ED Course  I have reviewed the triage vital signs and the nursing notes.  Pertinent labs & imaging results that were available during my care of the patient were reviewed by me and considered in my medical decision making (see chart for details).  Clinical Course as of Mar 29 913  Tue Mar 29, 2017  4098 Pt seen and  evaluated. Abd RLQ x 1 week. Intermittent. Now loose stool x3 episodes since last night. Mild Nausea, no vomitting. Appears well on exam.  [BS]  0734 ISTAT beta hCG positive. Unexpected, unwanted pregnancy. Asks to discuss termination here, will need [facility]. Will check serum beta hCG and transvaginal US to confirm IUP. I-stat hCG, quantitative: (!) >2,000.0 [BS]  [id number] Basic lab w/u unremarkable. Urine with significant protein, increased from last check 5mos prior.  [BS]  0749 BP mildly elevated today. Reports compliance with BP meds at home. BP: (!) 151/78 [BS]  0821 BP normalized w/o treatment. BP: 118/72 [BS]  0842 Pregnancy likely >4wks HCG, Beta Chain, [place] Longest 11,914 [BS]  [phone] Personally reviewed, demonstrates IUP. US OB Comp Less 14 Wks [BS]  0902 Pt refuses T3/4 labs.  [BS]  0902 Pt requests to leave. Pt advised to follow up with Obstetrics and contact information provided.  [BS]    Clinical Course User Index [BS] Carolynn Comment, MD   Pt presents with lower abd pain and several episodes of loose stool found to have pregnancy confirmed on ultrasound to be intrauterine. Pregnancy is high risk given comorbidities and unwanted. Pt requested to leave prior to full read from [place]. Pt was discharged with instructions to f/u with Obstetrics.  Final Clinical Impressions(s) / ED Diagnoses   Final diagnoses:  Pain  Pelvic pain in female  Pregnancy, unspecified gestational age    [place] Prescriptions New Prescriptions   No medications on file     Carolynn Comment, MD 03/29/17 5621    Cathren Laine, MD 03/29/17 [phone]

## 2017-04-06 ENCOUNTER — Telehealth: Payer: Self-pay | Admitting: Family Medicine

## 2017-04-06 NOTE — Telephone Encounter (Signed)
Called patient to discuss recent ED encounter and findings therein. No answer, did not leave VM.

## 2017-04-11 ENCOUNTER — Other Ambulatory Visit: Payer: Self-pay | Admitting: Family Medicine

## 2017-04-12 ENCOUNTER — Ambulatory Visit (INDEPENDENT_AMBULATORY_CARE_PROVIDER_SITE_OTHER): Payer: Self-pay | Admitting: Student

## 2017-04-12 ENCOUNTER — Encounter: Payer: Self-pay | Admitting: Student

## 2017-04-12 VITALS — BP 118/58 | HR 99 | Temp 98.0°F | Wt 269.8 lb

## 2017-04-12 DIAGNOSIS — O0001 Abdominal pregnancy with intrauterine pregnancy: Secondary | ICD-10-CM

## 2017-04-12 DIAGNOSIS — R05 Cough: Secondary | ICD-10-CM

## 2017-04-12 DIAGNOSIS — N912 Amenorrhea, unspecified: Secondary | ICD-10-CM

## 2017-04-12 DIAGNOSIS — R059 Cough, unspecified: Secondary | ICD-10-CM

## 2017-04-12 NOTE — Assessment & Plan Note (Signed)
Mild cough with mucous production. No red flags by history or physical exam - discussed conservative management - will follow as needed

## 2017-04-12 NOTE — Progress Notes (Signed)
   Subjective:    Patient ID: Tara [name], female    DOB: 03-Feb-1991, 26 y.o.   MRN: [id number]   CC: increased mucous production  HPI: 26 y/o F presents for increased mucous production    Increased mucous production - she has noted increased mucous for the last seevral weeks - she state that she has been spitting it up and it is bothersome to her -she has some cough associated - she denies fever, sore throat, ear pain, difficulty breathing   Pregnancy - she is approximately [redacted] weeks pregnant basedon US done on 4/10 - she does not desire the pregnancy and [facility]   Smoking status reviewed  Review of Systems  Per HPI, else denies chest pain,   Objective:  BP (!) 118/58   Pulse 99   Temp 98 F (36.7 C) (Oral)   Wt 269 lb 12.8 oz (122.4 kg)   SpO2 95%   BMI 43.55 kg/m  Vitals and nursing note reviewed  General: NAD HEENT: oropharynx non erythematous, no lesions or exudate or swelling midline uvula Cardiac: RRR Respiratory: CTAB, normal effort Skin: warm and dry, no rashes noted Neuro: alert and oriented, no focal deficits   Assessment & Plan:    Abdominal pregnancy with intrauterine pregnancy Pregnancy at approximately 10 weeks based on [place] at [redacted]w[redacted]d on 4/10.She states that she strongly does not wish to keep the pregnancy and plans [facility] but has done so yet.  - planned parent hood information given - abortion counseling given - will follow as needed  Cough Mild cough with mucous production. No red flags by history or physical exam - discussed conservative management - will follow as needed    Darlys Buis A. Kennon Rounds MD, MS Family Medicine Resident PGY-3 Pager [phone]

## 2017-04-12 NOTE — Assessment & Plan Note (Signed)
Pregnancy at approximately 10 weeks based on [place] at [redacted]w[redacted]d on 4/10.She states that she strongly does not wish to keep the pregnancy and [facility] but has done so yet.  - planned parent hood information given - abortion counseling given - will follow as needed

## 2017-04-12 NOTE — Patient Instructions (Signed)
[facility] as soon as possible. Their number is [phone] Take Mucinex or alka seltzer cold and flu for cough Call the office with questions or concerns

## 2017-04-12 NOTE — Telephone Encounter (Signed)
This patient needs to be seen if she needs a refill on this. She was recently seen in the ED for a pregnancy and we need to discuss her meds and OB care prior to any refills. Please call her and ask her to come in for this. Do not discuss pregnancy over the phone unless she brings it up, I am concerned about her relationships and not sure who knows about this.

## 2017-04-13 NOTE — Telephone Encounter (Signed)
Attempted to contact pt, however, no answer. VM was left requesting a call back, please set up an apt with pcp.

## 2017-04-15 NOTE — Telephone Encounter (Signed)
Left another message for patient to call back for appointment with PCP.

## 2017-04-18 ENCOUNTER — Ambulatory Visit (INDEPENDENT_AMBULATORY_CARE_PROVIDER_SITE_OTHER): Payer: Self-pay | Admitting: *Deleted

## 2017-04-18 DIAGNOSIS — Z111 Encounter for screening for respiratory tuberculosis: Secondary | ICD-10-CM

## 2017-04-18 NOTE — Progress Notes (Signed)
   PPD placed Left Forearm.  Pt to return 04/20/17 for reading.  Pt tolerated intradermal injection. Clovis Pu, RN

## 2017-04-20 ENCOUNTER — Encounter: Payer: Self-pay | Admitting: *Deleted

## 2017-04-20 ENCOUNTER — Ambulatory Visit (INDEPENDENT_AMBULATORY_CARE_PROVIDER_SITE_OTHER): Payer: Self-pay | Admitting: *Deleted

## 2017-04-20 DIAGNOSIS — Z111 Encounter for screening for respiratory tuberculosis: Secondary | ICD-10-CM

## 2017-04-20 LAB — TB SKIN TEST
INDURATION: 0 mm
TB SKIN TEST: NEGATIVE

## 2017-04-20 NOTE — Progress Notes (Signed)
   PPD Reading Note PPD read and results entered in EpicCare. Result: 0 mm induration. Interpretation: Negative If test not read within 48-72 hours of initial placement, patient advised to repeat in other arm 1-3 weeks after this test. Allergic reaction: no  Adesuwa Osgood L, RN  

## 2017-04-20 NOTE — Telephone Encounter (Signed)
Patient is coming in today for a nurse visit.  Placed a note regarding this on her appt note.  Jazmin Hartsell,CMA

## 2017-04-22 ENCOUNTER — Emergency Department (HOSPITAL_COMMUNITY): Payer: Medicaid Other

## 2017-04-22 ENCOUNTER — Encounter (HOSPITAL_COMMUNITY): Payer: Self-pay

## 2017-04-22 ENCOUNTER — Other Ambulatory Visit: Payer: Self-pay

## 2017-04-22 DIAGNOSIS — I420 Dilated cardiomyopathy: Secondary | ICD-10-CM | POA: Diagnosis present

## 2017-04-22 DIAGNOSIS — O99211 Obesity complicating pregnancy, first trimester: Secondary | ICD-10-CM | POA: Diagnosis present

## 2017-04-22 DIAGNOSIS — E876 Hypokalemia: Secondary | ICD-10-CM | POA: Diagnosis present

## 2017-04-22 DIAGNOSIS — Z888 Allergy status to other drugs, medicaments and biological substances status: Secondary | ICD-10-CM

## 2017-04-22 DIAGNOSIS — Z91048 Other nonmedicinal substance allergy status: Secondary | ICD-10-CM

## 2017-04-22 DIAGNOSIS — O99281 Endocrine, nutritional and metabolic diseases complicating pregnancy, first trimester: Secondary | ICD-10-CM | POA: Diagnosis present

## 2017-04-22 DIAGNOSIS — D649 Anemia, unspecified: Secondary | ICD-10-CM | POA: Diagnosis present

## 2017-04-22 DIAGNOSIS — O99011 Anemia complicating pregnancy, first trimester: Secondary | ICD-10-CM | POA: Diagnosis present

## 2017-04-22 DIAGNOSIS — O99411 Diseases of the circulatory system complicating pregnancy, first trimester: Principal | ICD-10-CM | POA: Diagnosis present

## 2017-04-22 DIAGNOSIS — Z9119 Patient's noncompliance with other medical treatment and regimen: Secondary | ICD-10-CM

## 2017-04-22 DIAGNOSIS — Z6841 Body Mass Index (BMI) 40.0 and over, adult: Secondary | ICD-10-CM

## 2017-04-22 DIAGNOSIS — I252 Old myocardial infarction: Secondary | ICD-10-CM

## 2017-04-22 DIAGNOSIS — O208 Other hemorrhage in early pregnancy: Secondary | ICD-10-CM | POA: Diagnosis present

## 2017-04-22 DIAGNOSIS — Z3A11 11 weeks gestation of pregnancy: Secondary | ICD-10-CM

## 2017-04-22 DIAGNOSIS — O0931 Supervision of pregnancy with insufficient antenatal care, first trimester: Secondary | ICD-10-CM

## 2017-04-22 DIAGNOSIS — Z79899 Other long term (current) drug therapy: Secondary | ICD-10-CM

## 2017-04-22 DIAGNOSIS — I5033 Acute on chronic diastolic (congestive) heart failure: Secondary | ICD-10-CM | POA: Diagnosis present

## 2017-04-22 DIAGNOSIS — E0500 Thyrotoxicosis with diffuse goiter without thyrotoxic crisis or storm: Secondary | ICD-10-CM | POA: Diagnosis present

## 2017-04-22 LAB — I-STAT TROPONIN, ED: Troponin i, poc: 0.01 ng/mL (ref 0.00–0.08)

## 2017-04-22 NOTE — ED Triage Notes (Signed)
Pt complaining of SOB x 1 week. Pt states hx of CHF. Pt states taking meds as rx'd. Pt sats 97% on RA.

## 2017-04-23 ENCOUNTER — Other Ambulatory Visit: Payer: Self-pay

## 2017-04-23 ENCOUNTER — Inpatient Hospital Stay (HOSPITAL_COMMUNITY)
Admission: EM | Admit: 2017-04-23 | Discharge: 2017-04-26 | DRG: 781 | Disposition: A | Discharge status: Home / Self Care | Payer: Medicaid Other | Attending: Family Medicine | Admitting: Family Medicine

## 2017-04-23 ENCOUNTER — Inpatient Hospital Stay (HOSPITAL_COMMUNITY): Payer: Medicaid Other

## 2017-04-23 ENCOUNTER — Encounter (HOSPITAL_COMMUNITY): Payer: Self-pay | Admitting: Cardiology

## 2017-04-23 DIAGNOSIS — I5033 Acute on chronic diastolic (congestive) heart failure: Secondary | ICD-10-CM

## 2017-04-23 DIAGNOSIS — Z331 Pregnant state, incidental: Secondary | ICD-10-CM

## 2017-04-23 DIAGNOSIS — D649 Anemia, unspecified: Secondary | ICD-10-CM | POA: Diagnosis present

## 2017-04-23 DIAGNOSIS — Z9119 Patient's noncompliance with other medical treatment and regimen: Secondary | ICD-10-CM | POA: Diagnosis not present

## 2017-04-23 DIAGNOSIS — R0602 Shortness of breath: Secondary | ICD-10-CM | POA: Diagnosis present

## 2017-04-23 DIAGNOSIS — I5032 Chronic diastolic (congestive) heart failure: Secondary | ICD-10-CM | POA: Diagnosis not present

## 2017-04-23 DIAGNOSIS — O208 Other hemorrhage in early pregnancy: Secondary | ICD-10-CM | POA: Diagnosis present

## 2017-04-23 DIAGNOSIS — Z3A11 11 weeks gestation of pregnancy: Secondary | ICD-10-CM | POA: Diagnosis not present

## 2017-04-23 DIAGNOSIS — Z79899 Other long term (current) drug therapy: Secondary | ICD-10-CM | POA: Diagnosis not present

## 2017-04-23 DIAGNOSIS — O99011 Anemia complicating pregnancy, first trimester: Secondary | ICD-10-CM | POA: Diagnosis present

## 2017-04-23 DIAGNOSIS — I252 Old myocardial infarction: Secondary | ICD-10-CM | POA: Diagnosis not present

## 2017-04-23 DIAGNOSIS — O99411 Diseases of the circulatory system complicating pregnancy, first trimester: Secondary | ICD-10-CM | POA: Diagnosis present

## 2017-04-23 DIAGNOSIS — O99211 Obesity complicating pregnancy, first trimester: Secondary | ICD-10-CM | POA: Diagnosis present

## 2017-04-23 DIAGNOSIS — R000 Tachycardia, unspecified: Secondary | ICD-10-CM

## 2017-04-23 DIAGNOSIS — Z91048 Other nonmedicinal substance allergy status: Secondary | ICD-10-CM | POA: Diagnosis not present

## 2017-04-23 DIAGNOSIS — O99281 Endocrine, nutritional and metabolic diseases complicating pregnancy, first trimester: Secondary | ICD-10-CM | POA: Diagnosis present

## 2017-04-23 DIAGNOSIS — O0931 Supervision of pregnancy with insufficient antenatal care, first trimester: Secondary | ICD-10-CM | POA: Diagnosis not present

## 2017-04-23 DIAGNOSIS — Z3490 Encounter for supervision of normal pregnancy, unspecified, unspecified trimester: Secondary | ICD-10-CM

## 2017-04-23 DIAGNOSIS — E0500 Thyrotoxicosis with diffuse goiter without thyrotoxic crisis or storm: Secondary | ICD-10-CM | POA: Diagnosis present

## 2017-04-23 DIAGNOSIS — E876 Hypokalemia: Secondary | ICD-10-CM | POA: Diagnosis present

## 2017-04-23 DIAGNOSIS — Z888 Allergy status to other drugs, medicaments and biological substances status: Secondary | ICD-10-CM | POA: Diagnosis not present

## 2017-04-23 DIAGNOSIS — Z6841 Body Mass Index (BMI) 40.0 and over, adult: Secondary | ICD-10-CM | POA: Diagnosis not present

## 2017-04-23 DIAGNOSIS — I420 Dilated cardiomyopathy: Secondary | ICD-10-CM | POA: Diagnosis present

## 2017-04-23 LAB — CBC
HCT: 33.3 % — ABNORMAL LOW (ref 36.0–46.0)
HCT: 33.7 % — ABNORMAL LOW (ref 36.0–46.0)
HEMOGLOBIN: 10.7 g/dL — AB (ref 12.0–15.0)
HEMOGLOBIN: 10.8 g/dL — AB (ref 12.0–15.0)
MCH: 22.7 pg — ABNORMAL LOW (ref 26.0–34.0)
MCH: 22.7 pg — ABNORMAL LOW (ref 26.0–34.0)
MCHC: 32.1 g/dL (ref 30.0–36.0)
MCHC: 32 g/dL (ref 30.0–36.0)
MCV: 70.7 fL — ABNORMAL LOW (ref 78.0–100.0)
MCV: 70.8 fL — ABNORMAL LOW (ref 78.0–100.0)
PLATELETS: 249 10*3/uL (ref 150–400)
Platelets: 209 10*3/uL (ref 150–400)
RBC: 4.71 MIL/uL (ref 3.87–5.11)
RBC: 4.76 MIL/uL (ref 3.87–5.11)
RDW: 14.4 % (ref 11.5–15.5)
RDW: 14.1 % (ref 11.5–15.5)
WBC: 7.6 10*3/uL (ref 4.0–10.5)
WBC: 6.7 10*3/uL (ref 4.0–10.5)

## 2017-04-23 LAB — BASIC METABOLIC PANEL
ANION GAP: 9 (ref 5–15)
BUN: 10 mg/dL (ref 6–20)
CALCIUM: 9.3 mg/dL (ref 8.9–10.3)
CO2: 22 mmol/L (ref 22–32)
CREATININE: 0.51 mg/dL (ref 0.44–1.00)
Chloride: 105 mmol/L (ref 101–111)
GLUCOSE: 94 mg/dL (ref 65–99)
Potassium: 3.6 mmol/L (ref 3.5–5.1)
Sodium: 136 mmol/L (ref 135–145)

## 2017-04-23 LAB — TROPONIN I

## 2017-04-23 LAB — MRSA PCR SCREENING: MRSA by PCR: NEGATIVE

## 2017-04-23 LAB — TSH
TSH: 0.001 u[IU]/mL — AB (ref 0.350–4.500)
TSH: <0.01 u[IU]/mL — ABNORMAL LOW (ref 0.350–4.500)

## 2017-04-23 LAB — HCG, QUANTITATIVE, PREGNANCY: HCG, BETA CHAIN, QUANT, S: 88972 m[IU]/mL — AB (ref <5)

## 2017-04-23 LAB — CREATININE, SERUM
CREATININE: 0.43 mg/dL — AB (ref 0.44–1.00)
GFR calc non Af Amer: >60 mL/min (ref >60)

## 2017-04-23 LAB — BRAIN NATRIURETIC PEPTIDE: B NATRIURETIC PEPTIDE 5: 133 pg/mL — AB (ref 0.0–100.0)

## 2017-04-23 LAB — MAGNESIUM: MAGNESIUM: 1.5 mg/dL — AB (ref 1.7–2.4)

## 2017-04-23 LAB — D-DIMER, QUANTITATIVE: D-Dimer, Quant: 0.85 ug/mL-FEU — ABNORMAL HIGH (ref 0.00–0.50)

## 2017-04-23 LAB — T4, FREE: FREE T4: 5.51 ng/dL — AB (ref 0.61–1.12)

## 2017-04-23 MED ORDER — FUROSEMIDE 10 MG/ML IJ SOLN
80.0000 mg | Freq: Two times a day (BID) | INTRAMUSCULAR | Status: DC
  Administered 2017-04-23 – 2017-04-25 (×5): 80 mg via INTRAVENOUS
  Filled 2017-04-23 (×5): qty 8

## 2017-04-23 MED ORDER — ENOXAPARIN SODIUM 40 MG/0.4ML ~~LOC~~ SOLN
40.0000 mg | SUBCUTANEOUS | Status: DC
  Filled 2017-04-23 (×2): qty 0.4

## 2017-04-23 MED ORDER — FUROSEMIDE 10 MG/ML IJ SOLN
80.0000 mg | Freq: Once | INTRAMUSCULAR | Status: AC
  Administered 2017-04-23: 80 mg via INTRAVENOUS
  Filled 2017-04-23: qty 8

## 2017-04-23 MED ORDER — SODIUM CHLORIDE 0.9% FLUSH: 3.0000 mL | INTRAVENOUS | Status: DC | PRN

## 2017-04-23 MED ORDER — SODIUM CHLORIDE 0.9% FLUSH
3.0000 mL | Freq: Two times a day (BID) | INTRAVENOUS | Status: DC
Start: 2017-04-23 — End: 2017-04-26
  Administered 2017-04-24 – 2017-04-25 (×4): 3 mL via INTRAVENOUS

## 2017-04-23 MED ORDER — DOCUSATE SODIUM 100 MG PO CAPS
100.0000 mg | ORAL_CAPSULE | Freq: Two times a day (BID) | ORAL | Status: DC
  Administered 2017-04-23 – 2017-04-25 (×5): 100 mg via ORAL
  Filled 2017-04-23 (×7): qty 1

## 2017-04-23 MED ORDER — SODIUM CHLORIDE 0.9 % IV SOLN: 250.0000 mL | INTRAVENOUS | Status: DC | PRN

## 2017-04-23 MED ORDER — METHIMAZOLE 10 MG PO TABS
15.0000 mg | ORAL_TABLET | Freq: Three times a day (TID) | ORAL | Status: DC
  Administered 2017-04-23 – 2017-04-25 (×7): 15 mg via ORAL
  Filled 2017-04-23 (×3): qty 2
  Filled 2017-04-23: qty 1.5
  Filled 2017-04-23: qty 2
  Filled 2017-04-23: qty 1.5
  Filled 2017-04-23: qty 2

## 2017-04-23 MED ORDER — LABETALOL HCL 200 MG PO TABS
200.0000 mg | ORAL_TABLET | Freq: Two times a day (BID) | ORAL | Status: DC
  Administered 2017-04-23 – 2017-04-26 (×6): 200 mg via ORAL
  Filled 2017-04-23 (×6): qty 1

## 2017-04-23 MED ORDER — LABETALOL HCL 100 MG PO TABS
100.0000 mg | ORAL_TABLET | Freq: Two times a day (BID) | ORAL | Status: DC
  Administered 2017-04-23: 100 mg via ORAL
  Filled 2017-04-23: qty 1

## 2017-04-23 MED ORDER — SODIUM CHLORIDE 0.9% FLUSH
3.0000 mL | Freq: Two times a day (BID) | INTRAVENOUS | Status: DC
  Administered 2017-04-23 – 2017-04-26 (×4): 3 mL via INTRAVENOUS

## 2017-04-23 NOTE — Progress Notes (Signed)
FPTS Interim Progress Note  S: Patient feels less SOB after lasix, but still nervous and tachycardic. She wants to know how big her heart is and if things are getting better with regard HF.  O: BP (!) 108/44   Pulse (!) 115   Temp 97.7 F (36.5 C) (Oral)   Resp (!) 22   Ht 5\' 6"  (1.676 m)   Wt 264 lb 4.8 oz (119.9 kg) Comment: b scale  LMP 01/31/2017 (Exact Date)   SpO2 99%   BMI 42.66 kg/m   Gen: obese female with exopthalmos resting in chair in NAD.  CV: tachycardic, regular rhythm Resp: CTAB, easy WOB, no crackles or wheezing Abd: Soft, +BS Ext: 2+ pitting edema to knees  A/P: Tachycardia: likely due to fluid overload and uncontrolled Graves disease as patient has been out of her MTX. D dimer appropriate for 1st trimester.  -continue to closely monitor -cardiology to weigh in  CHF exacerbation: patient with dialated cardiomyopathy and CHF thought to multifactorial related to Graves disease and postpartum cardiomyopathy. Follows with Dr. Milas Kocher outpatient.  -consulted [facility] Dr. Wyline Mood  -no additional lasix at this time, pending cards recs  Unintended pregnancy: unfortunate situation as patient was not desiring this pregnancy and did not pick up her OCPs due to cost (LARCs offered, patient declined at that time). For long term planning, would consider BTL given severe underlying medical conditions. Patietn would like a nexplanon for now once pregnancy is terminated. Offered depo prior to discharge regardless of LARC, patient is amenable to this.  -discussed with Dr. Emelda Fear, plan for termination  -transvaginal US pending. If pregnancy is nonviable, this will expedite D&C  -counseled patient extensively on teratogenic risks of various medications, she would like [place] to proceed with medications to stabilize her, including MTX and lasix, coreg, etc as if she were not pregnant.  Graves disease: patient currently ~[redacted] weeks pregnant. bHcG and transvaginal US pending. Technically PTU  is preferred in 1st trimester. D/w Dr. Emelda Fear who recommended either PTU or MTX. Patient informed of risks of MTX and would like to restart this.  -restart MTX   Garth Bigness, MD 04/23/2017, 11:14 AM PGY-1, [facility] Family Medicine Service pager [phone]

## 2017-04-23 NOTE — Consult Note (Signed)
Patient ID: Tara [name] MRN: [id number], DOB/AGE: 07/12/1991   Admit date: 04/23/2017  Reason for Consult: Acute on Chronic Diastolic CHF Requesting Physician:  Dr. Chanetta Marshall, Internal Medicine    Primary Physician: Garth Bigness, MD Primary Cardiologist: Dr. [name]    Pt. Profile:  Tara [name] is a 26 y.o. female who is being seen today for the evaluation of acute on chronic diastolic CHF, at the request of Dr. Chanetta Marshall, Internal Medicine.  . Problem List  Past Medical History:  Diagnosis Date  . Anemia   . CAP (community acquired pneumonia) 02/13/2016  . Chronic bronchitis (HCC)   . Graves disease   . Graves disease   . Headache   . Hypertension   . Myocardial infarction (HCC) 2013  . Obesity   . Pneumonia "several times"    Past Surgical History:  Procedure Laterality Date  . CESAREAN SECTION    . CESAREAN SECTION N/A 07/21/2016   Procedure: CESAREAN SECTION;  Surgeon: Lesly Dukes, MD;  Location: [facility] BIRTHING SUIT[facility];  Service: Obstetrics;  Laterality: N/A;  . EYE SURGERY Bilateral 2014   "for Graves Disease; eyelid retraction on left; decompression on the right""     Allergies  Allergies  Allergen Reactions  . Iodine Hives  . Shellfish Allergy Other (See Comments)    Reaction not noted  . Tape Rash    HPI  Tara [name] is a 26 y.o. female who is being seen today for the evaluation of acute on chronic diastolic CHF, at the request of Dr. Chanetta Marshall, Internal Medicine.   The patient has been followed by Dr. [name] in the HF clinic for diastolic HF. 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 EF was once 45%. Her last 2D echo 10/2016 showed normal LVEF at 60-65%, normal wall motion, and increased PA pressure at 54 mm Hg. She also has a h/o graves disease, HTN and pre-eclampsia with prior pregnancies. She is currently [redacted] weeks pregnant (unplanned pregnancy), but plan is to terminate the pregnancy given her multiple health  issues, placing her at high risk.   She presented to [facility] with a complaint of SOB with intermittent CP on exertion. She denies any resting symptoms but notes 4 pillow orthopnea. Her symptoms have been present x 1 week. On arrival, she was noted to be tachycardic but no hypoxia. HR was in the 100s-110s. Crackles were noted in admission H&P. CXR showed stable cardiomegaly with central vascular congestion consistent with mild pulmonary edema.   BNP mildy elevated to 133. D-dimer abnormal at 0.85 (per primary team, cutoff D dimer in first trimester for normal d-dimer <0.95). TSH low at <0.010. Free T3 pending. She was given a dose of IV Lasix, 80 mg, in the ED and admitted by [facility]. General cardiology asked to assist with management of her CHF. Of note, her last office weight in the AHF clinic was 246 lb. Weight today is 264 lb.   Home Medications  Prior to Admission medications   Medication Sig Start Date End Date Taking? Authorizing Provider  carvedilol (COREG) 12.5 MG tablet Take 1 tablet (12.5 mg total) by mouth 2 (two) times daily with a meal. 01/31/17  Yes Garth Bigness, MD  furosemide (LASIX) 40 MG tablet Take 2 tablets (80 mg total) by mouth daily. 03/08/17  Yes Graciella Freer, PA-C  methimazole (TAPAZOLE) 5 MG tablet Take 3 tablets (15 mg total) by mouth 3 (three) times daily. 01/31/17  Yes Garth Bigness, MD  benzonatate (TESSALON) 100 MG capsule Take 1 capsule (100 mg total) by mouth 2 (two) times daily as needed for cough. Patient not taking: Reported on 03/08/2017 02/11/17   Marquette Saa, MD  norgestimate-ethinyl estradiol (ORTHO-CYCLEN,SPRINTEC,PREVIFEM) 0.25-35 MG-MCG tablet Take 1 tablet by mouth daily. Patient not taking: Reported on 03/29/2017 01/31/17   Garth Bigness, MD    Hospital Medications  . docusate sodium  100 mg Oral BID  . enoxaparin (LOVENOX) injection  40 mg Subcutaneous Q24H  . labetalol  100 mg Oral BID  . methimazole  15 mg  Oral TID  . sodium chloride flush  3 mL Intravenous Q12H  . sodium chloride flush  3 mL Intravenous Q12H   . sodium chloride     sodium chloride, sodium chloride flush  Family History  Family History  Problem Relation Age of Onset  . Hypertension Mother     Social History  Social History   Social History  . Marital status: Single    Spouse name: N/A  . Number of children: N/A  . Years of education: N/A   Occupational History  . Not on file.   Social History Main Topics  . Smoking status: Never Smoker  . Smokeless tobacco: Never Used  . Alcohol use No  . Drug use: No  . Sexual activity: Not Currently   Other Topics Concern  . Not on file   Social History Narrative  . No narrative on file     Review of Systems General:  No chills, fever, night sweats or weight changes.  Cardiovascular:  No chest pain, dyspnea on exertion, edema, orthopnea, palpitations, paroxysmal nocturnal dyspnea. Dermatological: No rash, lesions/masses Respiratory: No cough, dyspnea Urologic: No hematuria, dysuria Abdominal:   No nausea, vomiting, diarrhea, bright red blood per rectum, melena, or hematemesis Neurologic:  No visual changes, wkns, changes in mental status. All other systems reviewed and are otherwise negative except as noted above.  Physical Exam  Blood pressure 132/72, pulse (!) 107, temperature 98.4 F (36.9 C), temperature source Oral, resp. rate 18, height 5\' 6"  (1.676 m), weight 264 lb 4.8 oz (119.9 kg), last menstrual period 01/31/2017, SpO2 98 %, not currently breastfeeding.  General: Pleasant, NAD, obese  Psych: Normal affect. Neuro: Alert and oriented X 3. Moves all extremities spontaneously. HEENT: Normal  Neck: Supple without bruits or JVD. Lungs:  Resp regular and unlabored, bibasilar crackles Heart: regular rhythm, tachycardic, 2/6 murmur noted (likely flow murmur) Abdomen: Soft, non-tender, non-distended, BS + x 4.  Extremities: No clubbing, cyanosis, 1+  bilateral pedal edema. DP/PT/Radials 2+ and equal bilaterally.  Labs  Troponin [facility] Test)  Recent Labs  04/22/17 2351  TROPIPOC 0.01    Recent Labs  04/23/17 0304 04/23/17 1028  TROPONINI <0.03 <0.03   Lab Results  Component Value Date   WBC 6.7 04/23/2017   HGB 10.8 (L) 04/23/2017   HCT 33.7 (L) 04/23/2017   MCV 70.8 (L) 04/23/2017   PLT 209 04/23/2017     Recent Labs Lab 04/22/17 2328 04/23/17 1028  NA 136  --   K 3.6  --   CL 105  --   CO2 22  --   BUN 10  --   CREATININE 0.51 0.43*  CALCIUM 9.3  --   GLUCOSE 94  --    Lab Results  Component Value Date   TRIG 162 (H) 10/30/2016   Lab Results  Component Value Date   DDIMER 0.85 (H) 04/23/2017     Radiology/Studies  Dg Chest 2 View  Result Date: 04/22/2017 CLINICAL DATA:  Midsternal chest pain and dyspnea, patient is [redacted] weeks pregnant and was double shielded for imaging. EXAM: CHEST  2 VIEW COMPARISON:  None. FINDINGS: The heart size and mediastinal contours are within normal limits. Mild central vascular congestion is noted with stable cardiomegaly. The visualized skeletal structures are unremarkable. IMPRESSION: Stable cardiomegaly with central vascular congestion consistent with mild pulmonary edema. Electronically Signed   By: Tollie Eth M.D.   On: 04/22/2017 23:59   US Ob Comp Less 14 Wks  Result Date: 04/23/2017 CLINICAL DATA:  26 year old pregnant female with tachycardia and history of CHF. EDC by first sonogram: 11/10/2017, projecting to an expected gestational age of [redacted] weeks 2 days. EXAM: OBSTETRIC <14 WK [place] AND TRANSVAGINAL OB US TECHNIQUE: Both transabdominal and transvaginal ultrasound examinations were performed for complete evaluation of the gestation as well as the maternal uterus, adnexal regions, and pelvic cul-de-sac. Transvaginal technique was performed to assess early pregnancy. COMPARISON:  03/29/2017 obstetric scan. FINDINGS: Intrauterine gestational sac: Single intrauterine  gestational sac is normal and size, shape and position. Yolk sac:  Visualized. Fetus: Visualized. Fetal anatomy not assessed at this early gestational age. Fetal Cardiac Activity: Regular rate and rhythm. Fetal Heart Rate: 144  bpm CRL:  45.4  mm   11 w   3 d                  [place] EDC: 11/09/2017 Subchorionic hemorrhage:  None visualized. Maternal uterus/adnexae: Anteverted uterus. No uterine fibroids demonstrated. Nonvisualization of the ovaries bilaterally. No adnexal masses demonstrated. No abnormal free fluid in the pelvis. IMPRESSION: 1. Single living intrauterine gestation at 11 weeks 3 days by crown-rump length, with appropriate interval fetal growth. 2. No acute first-trimester gestational abnormality. Normal fetal cardiac activity. Electronically Signed   By: Delbert Phenix M.D.   On: 04/23/2017 11:44   US Ob Comp Less 14 Wks  Result Date: 03/29/2017 CLINICAL DATA:  Abdominal cramping.  Vomiting. EXAM: OBSTETRIC <14 WK [place] AND TRANSVAGINAL OB US TECHNIQUE: Both transabdominal and transvaginal ultrasound examinations were performed for complete evaluation of the gestation as well as the maternal uterus, adnexal regions, and pelvic cul-de-sac. Transvaginal technique was performed to assess early pregnancy. COMPARISON:  None. FINDINGS: Intrauterine gestational sac: Single Yolk sac:  Es Embryo:  Yes Cardiac Activity:  yes Heart Rate: 157  bpm CRL:  13.9  mm   7 w   5 d                  [place] EDC: 11/10/2017 Subchorionic hemorrhage:  Small subchorionic hemorrhage. Maternal uterus/adnexae: Normal appearing ovaries with a corpus luteum cyst on the left ovary. No appreciable free fluid. IMPRESSION: Single intrauterine pregnancy of approximately 7 weeks 5 days gestation. Small subchorionic hemorrhage. Electronically Signed   By: Francene Boyers M.D.   On: 03/29/2017 10:04   US Ob Transvaginal  Result Date: 04/23/2017 CLINICAL DATA:  26 year old pregnant female with tachycardia and history of CHF. EDC by first  sonogram: 11/10/2017, projecting to an expected gestational age of [redacted] weeks 2 days. EXAM: OBSTETRIC <14 WK [place] AND TRANSVAGINAL OB US TECHNIQUE: Both transabdominal and transvaginal ultrasound examinations were performed for complete evaluation of the gestation as well as the maternal uterus, adnexal regions, and pelvic cul-de-sac. Transvaginal technique was performed to assess early pregnancy. COMPARISON:  03/29/2017 obstetric scan. FINDINGS: Intrauterine gestational sac: Single intrauterine gestational sac is normal and size, shape and position. Yolk sac:  Visualized.  Fetus: Visualized. Fetal anatomy not assessed at this early gestational age. Fetal Cardiac Activity: Regular rate and rhythm. Fetal Heart Rate: 144  bpm CRL:  45.4  mm   11 w   3 d                  [place] EDC: 11/09/2017 Subchorionic hemorrhage:  None visualized. Maternal uterus/adnexae: Anteverted uterus. No uterine fibroids demonstrated. Nonvisualization of the ovaries bilaterally. No adnexal masses demonstrated. No abnormal free fluid in the pelvis. IMPRESSION: 1. Single living intrauterine gestation at 11 weeks 3 days by crown-rump length, with appropriate interval fetal growth. 2. No acute first-trimester gestational abnormality. Normal fetal cardiac activity. Electronically Signed   By: Delbert Phenix M.D.   On: 04/23/2017 11:44   US Ob Transvaginal  Result Date: 03/29/2017 CLINICAL DATA:  Abdominal cramping.  Vomiting. EXAM: OBSTETRIC <14 WK [place] AND TRANSVAGINAL OB US TECHNIQUE: Both transabdominal and transvaginal ultrasound examinations were performed for complete evaluation of the gestation as well as the maternal uterus, adnexal regions, and pelvic cul-de-sac. Transvaginal technique was performed to assess early pregnancy. COMPARISON:  None. FINDINGS: Intrauterine gestational sac: Single Yolk sac:  Es Embryo:  Yes Cardiac Activity:  yes Heart Rate: 157  bpm CRL:  13.9  mm   7 w   5 d                  [place] EDC: 11/10/2017 Subchorionic hemorrhage:   Small subchorionic hemorrhage. Maternal uterus/adnexae: Normal appearing ovaries with a corpus luteum cyst on the left ovary. No appreciable free fluid. IMPRESSION: Single intrauterine pregnancy of approximately 7 weeks 5 days gestation. Small subchorionic hemorrhage. Electronically Signed   By: Francene Boyers M.D.   On: 03/29/2017 10:04    ECG  NSR 94 bpm. (computer read EKG as "atrial flutter" however this appears incorrect) -- personally reviewed  Telemetry  Sinus tach 110-- personally reviewed   Echocardiogram Previous admission 10/26/2016 Study Conclusions  - Left ventricle: The cavity size was normal. Wall thickness was   increased in a pattern of mild LVH. Systolic function was normal.   The estimated ejection fraction was in the range of 60% to 65%.   Wall motion was normal; there were no regional wall motion   abnormalities. - Aortic valve: There was very mild stenosis. There was mild   regurgitation. Peak velocity (S): 238 cm/s. Mean gradient (S): 10   mm Hg. - Mitral valve: Calcified annulus. - Left atrium: The atrium was mildly dilated. Anterior-posterior   dimension: 45 mm. - Tricuspid valve: There was mild regurgitation. - Pulmonary arteries: Systolic pressure was moderately increased.   PA peak pressure: 54 mm Hg (S). ASSESSMENT AND PLAN  Active Problems:   Shortness of breath   Pregnancy  1. Acute on Chronic Diastolic CHF:  Last echo 10/2016 showed normal LVEF. BNP 133 but this can be falsely low in the setting of obesity. Body mass index is 42.66 kg/m. CXR showed Stable cardiomegaly with central vascular congestion consistent with mild pulmonary edema. Her last OV weight was 246 lb and weight today is 264 lb, however she is [redacted] weeks pregnant, thus unsure her new baseline weight. She still has bibasilar crackles and 1+ bilateral pedal edema on exam. Continue IV lasix for diuresis. contiue strict I/Os and daily weights. Low sodium diet.  We will increase labetalol  to 200 mg BID. This will also help with HR.   2. Tachycardia: HR in the 110s. D-dimer WNL for first trimester pregnancy.  She has h/o Graves Disease. TSH is low at <0.010. Free T3 pending. We will increase labetalol to 200 mg BID. Primary team to f/u on thyroid labs and will management thyroid meds.   3. Pregnancy: [redacted] weeks pregnant. Unplanned pregnancy. Plan is to terminate given other medical problems placing her at high risk.    Signed, Robbie Lis, PA-C, [facility] 04/23/2017, 3:37 PM [facility] Pager: [phone]

## 2017-04-23 NOTE — Progress Notes (Signed)
   04/23/17 0644  Vitals  Temp 97.7 F (36.5 C)  Temp Source Oral  BP (!) 115/41  BP Location Left Arm  BP Method Automatic  Patient Position (if appropriate) Lying  Pulse Rate (!) 115  Pulse Rate Source Dinamap  Resp (!) 22  Oxygen Therapy  SpO2 99 %  O2 Device Room Air  Height and Weight  Height 5\' 6"  (1.676 m)  Weight 119.9 kg (264 lb 4.8 oz) (b scale)  Type of Scale Used Standing  Type of Weight Actual  BSA (Calculated - sq m) 2.36 sq meters  BMI (Calculated) 42.7  Weight in (lb) to have BMI = 25 154.6  Admitted pt to rm 3E27 from ED, pt alert and oriented, denied pain at this time, oriented to room, call bell placed within reach, placed on cardiac monitor, CCMD made aware.
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
## 2017-04-23 NOTE — ED Provider Notes (Signed)
[facility]-EMERGENCY DEPT Provider Note   CSN: [id number] Arrival date & time: 04/22/17  2300 By signing my name below, I, Levon Hedger, attest that this documentation has been prepared under the direction and in the presence of Aiven Kampe, Jeannett Senior, MD . Electronically Signed: Levon Hedger, Scribe. 04/23/2017. 12:49 AM.   History   Chief Complaint Chief Complaint  Patient presents with  . Shortness of Breath  . Congestive Heart Failure   HPI Tara [name] is a G6:P5, approximately 11.[redacted] week pregnant  26 y.o. female with a history of chronic diastolic heart failure, Graves disease, MI, HTN, and hypothyroidism who presents to the Emergency Department complaining of waxing and waning, gradually worsening shortness of breath onset one week ago. SOB is exacerbated by exertion and ambulation. She reports associated intermittent chest pain that is exacerbated by ambulation, headache, and productive cough. Pt expresses that her thyroid has been under control. Per pt, she is still taking all of her daily medications. Pt has not seen an OB during this pregnancy. She is not currently taking birth control. She denies any abdominal pain, vaginal bleeding, vaginal discharge, leg swelling or any other acute complaints at this time.   OBGYN: Elsie Lincoln, MD   The history is provided by the patient. No language interpreter was used.   Past Medical History:  Diagnosis Date  . Anemia   . CAP (community acquired pneumonia) 02/13/2016  . Chronic bronchitis (HCC)   . Graves disease   . Graves disease   . Headache   . Hypertension   . Myocardial infarction (HCC) 2013  . Obesity   . Pneumonia "several times"    Patient Active Problem List   Diagnosis Date Noted  . Abdominal pregnancy with intrauterine pregnancy 04/12/2017  . Chronic diastolic heart failure (HCC) 03/08/2017  . Cough 02/11/2017  . Birth control counseling 01/31/2017  . Dilated cardiomyopathy (HCC)   . Pulmonary hypertension (HCC)   .  Tachycardia   . Noncompliance   . Graves disease 02/11/2016  . Morbid obesity due to excess calories [facility])     Past Surgical History:  Procedure Laterality Date  . CESAREAN SECTION    . CESAREAN SECTION N/A 07/21/2016   Procedure: CESAREAN SECTION;  Surgeon: Lesly Dukes, MD;  Location: [facility] BIRTHING SUITES;  Service: Obstetrics;  Laterality: N/A;  . EYE SURGERY Bilateral 2014   "for Graves Disease; eyelid retraction on left; decompression on the right""    OB History    Gravida Para Term Preterm AB Living   6 5 4 1  0 5   SAB TAB Ectopic Multiple Live Births   0 0 0 0 4      Home Medications    Prior to Admission medications   Medication Sig Start Date End Date Taking? Authorizing Provider  benzonatate (TESSALON) 100 MG capsule Take 1 capsule (100 mg total) by mouth 2 (two) times daily as needed for cough. Patient not taking: Reported on 03/08/2017 02/11/17   Marquette Saa, MD  carvedilol (COREG) 12.5 MG tablet Take 1 tablet (12.5 mg total) by mouth 2 (two) times daily with a meal. 01/31/17   Garth Bigness, MD  furosemide (LASIX) 40 MG tablet Take 2 tablets (80 mg total) by mouth daily. 03/08/17   Graciella Freer, PA-C  methimazole (TAPAZOLE) 5 MG tablet Take 3 tablets (15 mg total) by mouth 3 (three) times daily. 01/31/17   Garth Bigness, MD  norgestimate-ethinyl estradiol (ORTHO-CYCLEN,SPRINTEC,PREVIFEM) 0.25-35 MG-MCG tablet Take 1 tablet by mouth daily. Patient  not taking: Reported on 03/29/2017 01/31/17   Garth Bigness, MD    Family History History reviewed. No pertinent family history.  Social History Social History  Substance Use Topics  . Smoking status: Never Smoker  . Smokeless tobacco: Never Used  . Alcohol use No    Allergies   Iodine; Shellfish allergy; and Tape  Review of Systems Review of Systems All systems reviewed and are negative for acute change except as noted in the HPI.   Physical Exam Updated Vital Signs BP  118/68 (BP Location: Left Arm)   Pulse (!) 118   Temp 98.6 F (37 C) (Oral)   Resp (!) 22   LMP 01/31/2017 (Exact Date)   SpO2 98%   Physical Exam  Constitutional: She is oriented to person, place, and time. She appears well-developed and well-nourished. No distress.  Obese  HENT:  Head: Normocephalic and atraumatic.  Mouth/Throat: Oropharynx is clear and moist. No oropharyngeal exudate.  Eyes: Conjunctivae and EOM are normal. Pupils are equal, round, and reactive to light.  Left proptosis  Neck: Normal range of motion. Neck supple.  No meningismus.  Cardiovascular: Regular rhythm, normal heart sounds and intact distal pulses.  Tachycardia present.   No murmur heard. Pulmonary/Chest: Effort normal and breath sounds normal. No respiratory distress.  Lungs with faint crackles at bases  Abdominal: Soft. There is no tenderness. There is no rebound and no guarding.  Musculoskeletal: Normal range of motion. She exhibits no edema or tenderness.  Neurological: She is alert and oriented to person, place, and time. No cranial nerve deficit. She exhibits normal muscle tone. Coordination normal.   5/5 strength throughout. CN 2-12 intact.Equal grip strength.   Skin: Skin is warm.  Psychiatric: She has a normal mood and affect. Her behavior is normal.  Nursing note and vitals reviewed.  ED Treatments / Results  DIAGNOSTIC STUDIES:  Oxygen Saturation is 98% on RA, normal by my interpretation.    COORDINATION OF CARE:  12:49 AM Discussed treatment plan with pt at bedside and pt agreed to plan.   Labs (all labs ordered are listed, but only abnormal results are displayed) Labs Reviewed  CBC - Abnormal; Notable for the following:       Result Value   Hemoglobin 10.7 (*)    HCT 33.3 (*)    MCV 70.7 (*)    MCH 22.7 (*)    All other components within normal limits  BASIC METABOLIC PANEL  I-STAT TROPOININ, ED    EKG  EKG Interpretation None       Radiology Dg Chest 2  View  Result Date: 04/22/2017 CLINICAL DATA:  Midsternal chest pain and dyspnea, patient is [redacted] weeks pregnant and was double shielded for imaging. EXAM: CHEST  2 VIEW COMPARISON:  None. FINDINGS: The heart size and mediastinal contours are within normal limits. Mild central vascular congestion is noted with stable cardiomegaly. The visualized skeletal structures are unremarkable. IMPRESSION: Stable cardiomegaly with central vascular congestion consistent with mild pulmonary edema. Electronically Signed   By: Tollie Eth M.D.   On: 04/22/2017 23:59    Procedures Procedures (including critical care time)  Medications Ordered in ED Medications - No data to display   Initial Impression / Assessment and Plan / ED Course  I have reviewed the triage vital signs and the nursing notes.  Pertinent labs & imaging results that were available during my care of the patient were reviewed by me and considered in my medical decision making (see chart for details).  G6 P5 at [redacted] weeks gestation presenting with 1 week of exertional shortness of breath, PND and orthopnea. History of diastolic failure on chronic Lasix.  Denies any vaginal bleeding or abdominal pain.  On exam she has bibasilar crackles with edema on x-ray. States compliance with her Lasix. EF normal on last echo.  Discussed with Dr. Emelda Fear on call for obstetrics. He feels benefits of Lasix outweigh pregnancy risks at this point and recommends treating volume overload with IV Lasix despite pregnancy status.  If The patient needs admission she is not viable and will need to be admitted to [facility]. It is too early for preeclampsia.  Patient does become tachypneic with attempted ambulation. No hypoxia. She is tachycardic to the 120s with ambulation into The 30s. No hypoxia. D-dimer 0.85.  patient is [redacted] weeks gestation. Pregnancy corrected cut off is 1.  Workup more consistent with CHF exacerbation given lungs and Xray finding.  PE  considered but less likely at this time.  If no improvement with diuresis may need CTPE imaging.  Admission d/w family medicine residents.  Final Clinical Impressions(s) / ED Diagnoses   Final diagnoses:  Acute on chronic diastolic congestive heart failure (HCC)  Pregnancy, unspecified gestational age    New Prescriptions New Prescriptions   No medications on file   I personally performed the services described in this documentation, which was scribed in my presence. The recorded information has been reviewed and is accurate.    Glynn Octave, MD 04/23/17 [phone]

## 2017-04-23 NOTE — Consult Note (Signed)
Reason for Consult: Undesired pregnancy in patient with diastolic heart failure, fluid overload, severe activity restriction, and recent history of postpartum cardiomyopathy. Referring Physician: Phill Myron MD.  Tara [name] is an 26 y.o. female. She is a gravida 6 para 4105 now 8 months since her last delivery. She is now 11 weeks 2 days by ultrasound, with a pregnancy Nelda Marse[place], that she does not desire to continue. She is currently admitted with fluid overload, with baseline tachycardia of 120 at rest, with associated tachypnea. She has significant exertional dyspnea and is not able to work in her current condition. She has not been taking her medicines for her hyperthyroidism due to cost considerations. She is not on Medicaid. She is followed at the advanced heart failure clinic by Dr. Jeffie Pollock, most recently his pain 03/08/2017. Please see her notes for details which include chronic diastolic CHF which is managed by 2 L daily fluid restriction 2000 mg sodium restriction, daily weights.. She was assessed at that time and considered NYHA IIIB -IV based on symptoms. Her thyroid dysfunction is considered part of the problem, and she is not taking her medications that she cannot afford. At the time of evidence that she spoke with a LCSW social worker phone number [phone].  Patient states that she desires to have pregnancy termination but has not initiated contact with abortion facility again due to cost considerations. Unfortunately she is unable to work sufficiently to gain finances   Pertinent Gynecological History: Menses: LMP 2/12/ 18 Bleeding:  Contraception: Pregnant DES exposure: unknown Blood transfusions: none Sexually transmitted diseases: no past history Previous GYN Procedures: Cesarean section 2017 August    Last pap: normal Date: 02/26/2016 OB History: G 6, P 4105   Menstrual History: Menarche age:  Patient's last menstrual period was 01/31/2017 (exact date).     Past Medical History:  Diagnosis Date  . Anemia   . CAP (community acquired pneumonia) 02/13/2016  . Chronic bronchitis ([place])   . Graves disease   . Graves disease   . Headache   . Hypertension   . Myocardial infarction ([place]) 2013  . Obesity   . Pneumonia "several times"    Past Surgical History:  Procedure Laterality Date  . CESAREAN SECTION    . CESAREAN SECTION N/A 07/21/2016   Procedure: CESAREAN SECTION;  Surgeon: Guss Bunde, MD;  Location: [place];  Service: Obstetrics;  Laterality: N/A;  . EYE SURGERY Bilateral 2014   "for Graves Disease; eyelid retraction on left; decompression on the right""    History reviewed. No pertinent family history.  Social History:  reports that she has never smoked. She has never used smokeless tobacco. She reports that she does not drink alcohol or use drugs.  Allergies:  Allergies  Allergen Reactions  . Iodine Hives  . Shellfish Allergy Other (See Comments)    Reaction not noted  . Tape Rash    Medications: I have reviewed the patient's current medications.  ROS  Blood pressure (!) 108/44, pulse (!) 115, temperature 97.7 F (36.5 C), temperature source Oral, resp. rate (!) 22, height '5\' 6"'$  (1.676 m), weight 119.9 kg (264 lb 4.8 oz), last menstrual period 01/31/2017, SpO2 99 %, not currently breastfeeding. Physical Exam  Constitutional: She is oriented to person, place, and time. She appears well-developed.  Cardiovascular:  Tachycardic baseline heart rate 115 -120  Respiratory:  Tachypnea and respiratoryrate24  GI: Soft.  Genitourinary:  Genitourinary Comments: Pelvic exam deferred at this time  Neurological: She is alert and  oriented to person, place, and time.  Skin: Skin is warm. She is diaphoretic.  Psychiatric: She has a normal mood and affect. Her behavior is normal. Judgment and thought content normal.  Anxious and concerned about her health and her ability to tolerate pregnancy    Results for  orders placed or performed during the hospital encounter of 04/23/17 (from the past 48 hour(s))  Basic metabolic panel     Status: None   Collection Time: 04/22/17 11:28 PM  Result Value Ref Range   Sodium 136 135 - 145 mmol/L   Potassium 3.6 3.5 - 5.1 mmol/L   Chloride 105 101 - 111 mmol/L   CO2 22 22 - 32 mmol/L   Glucose, Bld 94 65 - 99 mg/dL   BUN 10 6 - 20 mg/dL   Creatinine, Ser 0.51 0.44 - 1.00 mg/dL   Calcium 9.3 8.9 - 10.3 mg/dL   GFR calc non Af Amer >60 >60 mL/min   GFR calc Af Amer >60 >60 mL/min    Comment: (NOTE) The eGFR has been calculated using the CKD EPI equation. This calculation has not been validated in all clinical situations. eGFR's persistently <60 mL/min signify possible Chronic Kidney Disease.    Anion gap 9 5 - 15  CBC     Status: Abnormal   Collection Time: 04/22/17 11:28 PM  Result Value Ref Range   WBC 7.6 4.0 - 10.5 K/uL   RBC 4.71 3.87 - 5.11 MIL/uL   Hemoglobin 10.7 (L) 12.0 - 15.0 g/dL   HCT 33.3 (L) 36.0 - 46.0 %   MCV 70.7 (L) 78.0 - 100.0 fL   MCH 22.7 (L) 26.0 - 34.0 pg   MCHC 32.1 30.0 - 36.0 g/dL   RDW 14.4 11.5 - 15.5 %   Platelets 249 150 - 400 K/uL  Brain natriuretic peptide     Status: Abnormal   Collection Time: 04/22/17 11:28 PM  Result Value Ref Range   B Natriuretic Peptide 133.0 (H) 0.0 - 100.0 pg/mL  I-stat troponin, ED     Status: None   Collection Time: 04/22/17 11:51 PM  Result Value Ref Range   Troponin i, poc 0.01 0.00 - 0.08 ng/mL   Comment 3            Comment: Due to the release kinetics of cTnI, a negative result within the first hours of the onset of symptoms does not rule out myocardial infarction with certainty. If myocardial infarction is still suspected, repeat the test at appropriate intervals.   D-dimer, quantitative (not at [facility])     Status: Abnormal   Collection Time: 04/23/17  1:57 AM  Result Value Ref Range   D-Dimer, Quant 0.85 (H) 0.00 - 0.50 ug/mL-FEU    Comment: (NOTE) At the manufacturer  cut-off of 0.50 ug/mL FEU, this assay has been documented to exclude PE with a sensitivity and negative predictive value of 97 to 99%.  At this time, this assay has not been approved by the FDA to exclude DVT/VTE. Results should be correlated with clinical presentation.   Troponin I     Status: None   Collection Time: 04/23/17  3:04 AM  Result Value Ref Range   Troponin I <0.03 <0.03 ng/mL  MRSA PCR Screening     Status: None   Collection Time: 04/23/17  6:52 AM  Result Value Ref Range   MRSA by PCR NEGATIVE NEGATIVE    Comment:        The GeneXpert MRSA Assay (FDA  approved for NASAL specimens only), is one component of a comprehensive MRSA colonization surveillance program. It is not intended to diagnose MRSA infection nor to guide or monitor treatment for MRSA infections.     Dg Chest 2 View  Result Date: 04/22/2017 CLINICAL DATA:  Midsternal chest pain and dyspnea, patient is [redacted] weeks pregnant and was double shielded for imaging. EXAM: CHEST  2 VIEW COMPARISON:  None. FINDINGS: The heart size and mediastinal contours are within normal limits. Mild central vascular congestion is noted with stable cardiomegaly. The visualized skeletal structures are unremarkable. IMPRESSION: Stable cardiomegaly with central vascular congestion consistent with mild pulmonary edema. Electronically Signed   By: Ashley Royalty M.D.   On: 04/22/2017 23:59    Assessment/Plan: 1. Undesired intrauterine pregnancy 11 weeks 2 days 2. Diastolic heart failure, NYHA class IIIB-IV  I believe this patient is at significant risk of morbidity or mortality if the pregnancy is continued.  The case has been reviewed with Dr [name] who will be working to see if termination service can be arranged thru our facility next week after [facility] [place] requirements for 72  Hour informed consent have  been met. I would appreciate cardiology's input as to whether case should be done here, or pt transferred to [facility] hospital for  completion. u/s for confirmation that pregnancy is still living is ordered. We are available at ext [phone].  Thank you Jonnie Kind 04/23/2017 [phone]

## 2017-04-23 NOTE — H&P (Signed)
Family Medicine Teaching [facility] Admission History and Physical Service Pager: [phone]  Patient name: Tara [name] Medical record number: [id number] Date of birth: July 11, 1991 Age: 26 y.o. Gender: female  Primary Care Provider: Garth Bigness, MD Consultants: OB  Code Status: FULL   Chief Complaint: Shortness of breath in pregnancy    Assessment and Plan: Tara [name] is a 26 y.o. female presenting with shortness of breath x1 week . PMH is significant for confirmed IUP at 11 weeks, CHF, obesity, and hyperthyroidism with Graves disease.    Shortness of breath x 1 week.  With worsening shortness of breath some intermittent CP on exertion that is not present at rest.  On arrival to ED, no hypoxia but tachycardic to 100-110.  Patient is a G6P5 with an IUP at 11 weeks confirmed by ultrasound at [redacted]w[redacted]d and has not yet seen OB for this pregnancy.  EKG shows sinus rhythm with tachycardia (this is reported by ED provider, not available for review in EMR).  On exam with mild crackles appreciated at lung bases and lower extremity non-pitting edema.  CXR with stable cardiomegaly with central vascular congestion consistent with mild pulmonary edema.   BNP mildy elevated to 133. Labs otherwise unremarkable.   IV Lasix 80 mg x 1 given in ED.  Patient at home on po Lasix 80 mg daily and she reports she has been taking this.   Endorses 4-pillow orthopnea. Weight is 264 lbs at admission. Unable to determine patient's dry weight. Has been between 240-280 lbs over the past several months but does have signs of volume overload on exam.  Symptoms and findings likely 2/2 CHF exacerbation.  However, PE must also be considered given shortness of breath in pregnancy. No calf tenderness or evidence of DVT on exam. Patient is not hypoxic and did not have acute SOB. No pleuritic chest pain or frank hemoptysis.  D-dimer obtained and was 0.85 (cutoff D dimer in first trimester for normal d-dimer <0.95; however  d-dimer is not the most reliable test during pregnancy).  ACS also unlikely with negative troponin and normal EKG.  Can also consider dyspnea 2/2 uncontrolled hyperthyroidism.  Given CXR findings more consistent with pulmonary edema due to CHF, did not perform further imaging for PE work up.  Of note, difficult to assess dry weight, however after aggressive diuresis during an ICU admission in November 2017 she was 241 lbs.  Weight in Feb 2017 was 280 lbs when admitted for palpitations and dyspnea.  She was not treated for CHF exacerbation at that time and Lasix was restarted.  Currently weighs less than at that admission, 269 lbs. Per chart review, heart failure recently increased Lasix to 80 mg daily in March during outpatient visit however weight has not changed (was 267 lbs at that time).   -Admit to FPTS tele, attending Dr. Lum Babe  -Heart failure consulted  -OB consulted, appreciate recs -Continuous cardiac monitoring  -Trend troponins -AM EKG -s/p IV Lasix 80 mg x1 in ED.  Will re-assess fluid status for further diuresis.  -Daily weights -strict I's and O's  -AM CBC, BMET  -Mag level  -Continuous pulse ox -Vitals per unit routine  -if patient's SOB significantly worsened or she had new O2 requirement, would have low threshold to obtain imaging to evaluate for PE   G6P5 at 11 weeks Confirmed IUP by ultrasound on 4/10. Reports has not received prenatal care yet.  This is an unplanned pregnancy and she does not wish to keep it.  History of preeclampsia with her last pregnancy but reports no other complications. No pregnancy related symptoms on this admission -- denies abdominal pain, vaginal bleeding, discharge.  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 Last echo 10/2016 with EF 60-65% and mild LVH without wall motion abnormalities.  Takes po Lasix 80 mg at home and reports good compliance  with this.   Bilateral LE swelling is non-pitting and mild crackles appreciated on lung exam.   -s/p IV Lasix 80 mg x 1 given in ED -Will reassess for further diuresis -heart failure consult pending   Tachycardia On admission tachycardic to 100-110.  Likely 2/2 to uncontrolled hyperthyroidism.  At home on Coreg 12.5 mg BID but given she is pregnant will switch to Labetalol.  -Labetalol 100 mg BID; can titrate as needed  -Monitor HR and blood pressures   Hyperthyroidism with Graves disease Uncontrolled with last TSH <0.010.  At home on Methimazole 5 mg TID. Per chart review, history of noncompliance with this medication due to cost.  Last taken 2 weeks ago.  Patient should switch to PTU in setting of pregnancy as MMI not safe.  Discussed with Dr. Emelda Fear (OB) and agree that we can select a dose of PTU.  -OB consulted -Will need to add on PTU pending Dr. Rayna Sexton recs -TSH level -Free T3 -Free T4  Recent negative PPD Placed in left forearm on 4/30 and read on 5/2 as 0 mm induration.    Social  Patient states this is an unplanned pregnancy that she does not wish to keep.  -Consult to CSW    FEN/GI: SLIV, Regular diet Prophylaxis: Lovenox   Disposition: Admit to telemetry, attending Dr. Lum Babe   History of Present Illness:  Tara [name] is a 26 y.o. female presenting with progressively worsening of her SOB over the past three days. Says it has been "building up" but did not start acutely.  Does not pain with deep inspiration. Has midline chest pain with exertion. Non-radiating.  Has been taking her Lasix 80 mg daily. Sensation of heart racing with movement and somewhat at rest, but significantly less so.  Has trouble affording her Methimazole and last took it about 2 weeks ago. Is currently taking Coreg at home.  History of growth restriction in prior pregnancy with pre-eclampsia. LE edema has been present with her SOB. It has been equal and non-painful. Denies calf pain at rest or  with walking. Did not start the OCPs prescribed to her prior to this pregnancy.   Review Of Systems: Per HPI with the following additions:  Review of Systems  Constitutional: Negative for chills, diaphoresis and fever.  HENT: Negative for congestion.   Respiratory: Positive for shortness of breath. Negative for wheezing.   Cardiovascular: Positive for chest pain, palpitations, orthopnea and leg swelling. Negative for claudication.  Gastrointestinal: Negative for abdominal pain, blood in stool, diarrhea, nausea and vomiting.  Genitourinary: Negative for dysuria, frequency and urgency.  Neurological: Negative for weakness.   Patient Active Problem List   Diagnosis Date Noted  . Shortness of breath 04/23/2017  . Pregnancy   . Abdominal pregnancy with intrauterine pregnancy 04/12/2017  . Acute on chronic diastolic congestive heart failure (HCC) 03/08/2017  . Cough 02/11/2017  . Birth control counseling 01/31/2017  . Dilated cardiomyopathy (HCC)   . Pulmonary hypertension (HCC)   . Tachycardia   . Noncompliance   . Graves disease 02/11/2016  .  Morbid obesity due to excess calories [facility])    Past Medical History: Past Medical History:  Diagnosis Date  . Anemia   . CAP (community acquired pneumonia) 02/13/2016  . Chronic bronchitis (HCC)   . Graves disease   . Graves disease   . Headache   . Hypertension   . Myocardial infarction (HCC) 2013  . Obesity   . Pneumonia "several times"   Past Surgical History: Past Surgical History:  Procedure Laterality Date  . CESAREAN SECTION    . CESAREAN SECTION N/A 07/21/2016   Procedure: CESAREAN SECTION;  Surgeon: Lesly Dukes, MD;  Location: [facility] BIRTHING SUITES;  Service: Obstetrics;  Laterality: N/A;  . EYE SURGERY Bilateral 2014   "for Graves Disease; eyelid retraction on left; decompression on the right""   Social History: Social History  Substance Use Topics  . Smoking status: Never Smoker  . Smokeless tobacco: Never Used  .  Alcohol use No   Additional social history: Please also refer to relevant sections of EMR.  Family History: History reviewed. No pertinent family history.  Allergies and Medications: Allergies  Allergen Reactions  . Iodine Hives  . Shellfish Allergy Other (See Comments)    Reaction not noted  . Tape Rash   No current facility-administered medications on file prior to encounter.    Current Outpatient Prescriptions on File Prior to Encounter  Medication Sig Dispense Refill  . carvedilol (COREG) 12.5 MG tablet Take 1 tablet (12.5 mg total) by mouth 2 (two) times daily with a meal. 60 tablet 0  . furosemide (LASIX) 40 MG tablet Take 2 tablets (80 mg total) by mouth daily. 65 tablet 1  . methimazole (TAPAZOLE) 5 MG tablet Take 3 tablets (15 mg total) by mouth 3 (three) times daily. 270 tablet 1  . benzonatate (TESSALON) 100 MG capsule Take 1 capsule (100 mg total) by mouth 2 (two) times daily as needed for cough. (Patient not taking: Reported on 03/08/2017) 20 capsule 0  . norgestimate-ethinyl estradiol (ORTHO-CYCLEN,SPRINTEC,PREVIFEM) 0.25-35 MG-MCG tablet Take 1 tablet by mouth daily. (Patient not taking: Reported on 03/29/2017) 1 Package 11  . [DISCONTINUED] metoprolol (LOPRESSOR) 50 MG tablet Take 50 mg by mouth 2 (two) times daily.     Objective: BP (!) 108/44   Pulse (!) 115   Temp 97.7 F (36.5 C) (Oral)   Resp (!) 22   Ht 5\' 6"  (1.676 m)   Wt 264 lb 4.8 oz (119.9 kg) Comment: b scale  LMP 01/31/2017 (Exact Date)   SpO2 99%   BMI 42.66 kg/m  Exam: General: 26 yo female lying in hospital bed, in NAD  Eyes: EOMI, PERRL, exopthalmos present (R>L)  ENTM: MMM, o/p clear  Neck: supple, no JVD, no thyromegaly  Cardiovascular: tachycardic, regular rhythm, palpable pulses Respiratory: CTA B/L with normal WOB  Gastrointestinal: soft, NTND, +bs  MSK: normal ROM  Derm: warm, dry  Neuro: AAOx3, no focal deficits, speech normal Psych: normal mood and affect   Labs and  Imaging: CBC BMET   Recent Labs Lab 04/22/17 2328  WBC 7.6  HGB 10.7*  HCT 33.3*  PLT 249    Recent Labs Lab 04/22/17 2328  NA 136  K 3.6  CL 105  CO2 22  BUN 10  CREATININE 0.51  GLUCOSE 94  CALCIUM 9.3      Freddrick March, MD 04/23/2017, 8:30 AM PGY-1, [facility] FPTS Intern pager: [phone], text pages welcome  Upper Level Addendum:  I have seen and evaluated this  patient along with Dr. Nelson Chimes and reviewed the above note, making necessary revisions in green.   Marcy Siren, D.O. 04/23/2017, 8:45 AM PGY-2, [facility]

## 2017-04-23 NOTE — ED Notes (Signed)
Pt O2 stayed between 93-95 while ambulating with her pulse between 120-130.

## 2017-04-24 DIAGNOSIS — E0590 Thyrotoxicosis, unspecified without thyrotoxic crisis or storm: Secondary | ICD-10-CM

## 2017-04-24 DIAGNOSIS — Z3A11 11 weeks gestation of pregnancy: Secondary | ICD-10-CM

## 2017-04-24 DIAGNOSIS — I509 Heart failure, unspecified: Secondary | ICD-10-CM

## 2017-04-24 LAB — CBC
HCT: 32.2 % — ABNORMAL LOW (ref 36.0–46.0)
Hemoglobin: 10.2 g/dL — ABNORMAL LOW (ref 12.0–15.0)
MCH: 22.3 pg — AB (ref 26.0–34.0)
MCHC: 31.7 g/dL (ref 30.0–36.0)
MCV: 70.5 fL — AB (ref 78.0–100.0)
PLATELETS: 224 10*3/uL (ref 150–400)
RBC: 4.57 MIL/uL (ref 3.87–5.11)
RDW: 14 % (ref 11.5–15.5)
WBC: 6.8 10*3/uL (ref 4.0–10.5)

## 2017-04-24 LAB — BASIC METABOLIC PANEL
Anion gap: 9 (ref 5–15)
BUN: 9 mg/dL (ref 6–20)
CHLORIDE: 104 mmol/L (ref 101–111)
CO2: 24 mmol/L (ref 22–32)
CREATININE: 0.52 mg/dL (ref 0.44–1.00)
Calcium: 8.6 mg/dL — ABNORMAL LOW (ref 8.9–10.3)
GFR calc Af Amer: >60 mL/min (ref >60)
GFR calc non Af Amer: >60 mL/min (ref >60)
GLUCOSE: 96 mg/dL (ref 65–99)
Potassium: 3.2 mmol/L — ABNORMAL LOW (ref 3.5–5.1)
Sodium: 137 mmol/L (ref 135–145)

## 2017-04-24 LAB — T3, FREE: T3, Free: 29.6 pg/mL — ABNORMAL HIGH (ref 2.0–4.4)

## 2017-04-24 MED ORDER — POTASSIUM CHLORIDE CRYS ER 20 MEQ PO TBCR
40.0000 meq | EXTENDED_RELEASE_TABLET | Freq: Two times a day (BID) | ORAL | Status: AC
  Administered 2017-04-24: 40 meq via ORAL
  Filled 2017-04-24 (×3): qty 2

## 2017-04-24 MED ORDER — MAGNESIUM SULFATE 2 GM/50ML IV SOLN
2.0000 g | Freq: Once | INTRAVENOUS | Status: AC
Start: 2017-04-24 — End: 2017-04-24
  Administered 2017-04-24: 2 g via INTRAVENOUS
  Filled 2017-04-24: qty 50

## 2017-04-24 NOTE — Progress Notes (Signed)
Subjective: Patient reports improved breathing, has lost 4 pounds in 24 hours. Able to lie flat in bed unlabored. Pt desires to proceed with termination of pregnancy due to significant life risk to her of continuing pregnancy . In the future she will need long acting sterilization such as IUD, which could be placed at time of  Medically indicated termination...    Objective: I have reviewed patient's vital signs, intake and output and medications.  General: alert, cooperative and appears older than stated age Resp: clear to auscultation bilaterally GI: soft, non-tender; bowel sounds normal; no masses,  no organomegaly u/s confirms fetal cardiac activity at 11+ [redacted] wk gestation.   Assessment/Plan: Will work with Faculty Practice team to provide option of pregnancy termination to patient  In accordance with applicable rules and regs. Faculty practice attending today is Dr Jolayne Panther,; we are always available on [phone]. We will need cardiology clearance and anesthesia support for Suction Dilation and Evacuation of pregnancy , with indication being Class IIIb-IV heart failure.  LOS: 1 day    Tara [name] V 04/24/2017, 10:22 AM  [phone]

## 2017-04-24 NOTE — Progress Notes (Signed)
Family Medicine Teaching Service Daily Progress Note Intern Pager: [phone]  Patient name: Tara [name] Medical record number: [id number] Date of birth: August 18, 1991 Age: 26 y.o. Gender: female  Primary Care Provider: Garth Bigness, MD Consultants: Cardiology, OBGYN Code Status: Full  Pt Overview and Major Events to Date:   Assessment and Plan: Tara [name] is a 26 y.o. female presenting with shortness of breath x1 week . PMH is significant for confirmed IUP at 11 weeks, CHF, obesity, and hyperthyroidism with Graves disease.    CHF exacerbation: Euvolemic on examination. 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 Follows with Dr. Milas Kocher outpatient.  Patient is a G6P5 with an IUP at 11 weeks confirmed by ultrasound at [redacted]w[redacted]d and has not yet seen OB for this pregnancy. CXR with stable cardiomegaly with central vascular congestion consistent with mild pulmonary edema. BNP mildy elevated to 133. Home lasix 80 po. D-dimer obtained and was 0.85 (cutoff D dimer in first trimester for normal d-dimer <0.95; however d-dimer is not the most reliable test during pregnancy).  ACS r/u with troponin negative x 3 and EKG without ST elevation/depression.  Dry weight between 240- 250 lbs  -Telemetry  -Cardiology following, per cardiology lasix 80 IV BID  -Daily weights 264 lbs> 260 lbs  -strict I's and O's  > Net negative - 1.58 -AM CBC, BMET  -Mag 1.5 repleted, K 3.2 (KDUR 40 x 2)    G6P5 at 11 weeks Confirmed IUP by ultrasound on 4/10. Reports has not received prenatal care yet.  This is an unplanned pregnancy and she does not wish to keep it.  History of preeclampsia with her last pregnancy but reports no other complications. No pregnancy related symptoms on this admission -- denies abdominal pain, vaginal bleeding, discharge.  -bHCG ordered  -CSW consult for resources -OBGYN following, plans for D&C   Tachycardia, Resolved  On admission  tachycardic to 100-110, improved over the last 24 hours. Likely 2/2 to uncontrolled hyperthyroidism.  -Labetalol 200 mg BID;  -Monitor HR and blood pressures   Hyperthyroidism with Graves disease, uncontrolled. TSH 0.0001, T4 5.51, T3 29.6. Does not take home Methimazole 5 mg TID.Due to plans for [facility] will continue Methimazole.  - Continue Methimazole 5 mg TID  - OB consulted  Social  Patient states this is an unplanned pregnancy that she does not wish to keep.  -Consult to CSW   FEN/GI: SLIV, Regular diet Prophylaxis: Lovenox   Disposition: Home   Subjective:  Improved breathing, no complaints. Concerned about missing school.   Objective: Temp:  [98 F (36.7 C)-98.4 F (36.9 C)] 98.3 F (36.8 C) (05/06 0429) Pulse Rate:  [90-107] 95 (05/06 0429) Resp:  [17-18] 18 (05/06 0429) BP: (128-132)/(56-72) 128/60 (05/06 0429) SpO2:  [97 %-98 %] 98 % (05/06 0429) Weight:  [260 lb 1.6 oz (118 kg)] 260 lb 1.6 oz (118 kg) (05/06 0429) Physical Exam: General: Sitting in chair, NAD, room air Cardiovascular: RRR, no murmurs, rubs or gallops Respiratory: CTAB, no crackles noted  Abdomen: BS+ no ttp Extremities: No lower extremities edema, no hip edema noted either   Laboratory:  Recent Labs Lab 04/22/17 2328 04/23/17 1028 04/24/17 0347  WBC 7.6 6.7 6.8  HGB 10.7* 10.8* 10.2*  HCT 33.3* 33.7* 32.2*  PLT 249 209 224    Recent Labs Lab 04/22/17 2328 04/23/17 1028 04/24/17 0347  NA 136  --  137  K 3.6  --  3.2*  CL 105  --  104  CO2  22  --  24  BUN 10  --  9  CREATININE 0.51 0.43* 0.52  CALCIUM 9.3  --  8.6*  GLUCOSE 94  --  96     Imaging/Diagnostic Tests: Dg Chest 2 View  Result Date: 04/22/2017 CLINICAL DATA:  Midsternal chest pain and dyspnea, patient is [redacted] weeks pregnant and was double shielded for imaging. EXAM: CHEST  2 VIEW COMPARISON:  None. FINDINGS: The heart size and mediastinal contours are within normal limits. Mild central vascular congestion is noted  with stable cardiomegaly. The visualized skeletal structures are unremarkable. IMPRESSION: Stable cardiomegaly with central vascular congestion consistent with mild pulmonary edema. Electronically Signed   By: Tollie Eth M.D.   On: 04/22/2017 23:59   US Ob Comp Less 14 Wks  Result Date: 04/23/2017 CLINICAL DATA:  26 year old pregnant female with tachycardia and history of CHF. EDC by first sonogram: 11/10/2017, projecting to an expected gestational age of [redacted] weeks 2 days. EXAM: OBSTETRIC <14 WK [place] AND TRANSVAGINAL OB US TECHNIQUE: Both transabdominal and transvaginal ultrasound examinations were performed for complete evaluation of the gestation as well as the maternal uterus, adnexal regions, and pelvic cul-de-sac. Transvaginal technique was performed to assess early pregnancy. COMPARISON:  03/29/2017 obstetric scan. FINDINGS: Intrauterine gestational sac: Single intrauterine gestational sac is normal and size, shape and position. Yolk sac:  Visualized. Fetus: Visualized. Fetal anatomy not assessed at this early gestational age. Fetal Cardiac Activity: Regular rate and rhythm. Fetal Heart Rate: 144  bpm CRL:  45.4  mm   11 w   3 d                  [place] EDC: 11/09/2017 Subchorionic hemorrhage:  None visualized. Maternal uterus/adnexae: Anteverted uterus. No uterine fibroids demonstrated. Nonvisualization of the ovaries bilaterally. No adnexal masses demonstrated. No abnormal free fluid in the pelvis. IMPRESSION: 1. Single living intrauterine gestation at 11 weeks 3 days by crown-rump length, with appropriate interval fetal growth. 2. No acute first-trimester gestational abnormality. Normal fetal cardiac activity. Electronically Signed   By: Delbert Phenix M.D.   On: 04/23/2017 11:44   US Ob Transvaginal  Result Date: 04/23/2017 CLINICAL DATA:  26 year old pregnant female with tachycardia and history of CHF. EDC by first sonogram: 11/10/2017, projecting to an expected gestational age of [redacted] weeks 2 days. EXAM:  OBSTETRIC <14 WK [place] AND TRANSVAGINAL OB US TECHNIQUE: Both transabdominal and transvaginal ultrasound examinations were performed for complete evaluation of the gestation as well as the maternal uterus, adnexal regions, and pelvic cul-de-sac. Transvaginal technique was performed to assess early pregnancy. COMPARISON:  03/29/2017 obstetric scan. FINDINGS: Intrauterine gestational sac: Single intrauterine gestational sac is normal and size, shape and position. Yolk sac:  Visualized. Fetus: Visualized. Fetal anatomy not assessed at this early gestational age. Fetal Cardiac Activity: Regular rate and rhythm. Fetal Heart Rate: 144  bpm CRL:  45.4  mm   11 w   3 d                  [place] EDC: 11/09/2017 Subchorionic hemorrhage:  None visualized. Maternal uterus/adnexae: Anteverted uterus. No uterine fibroids demonstrated. Nonvisualization of the ovaries bilaterally. No adnexal masses demonstrated. No abnormal free fluid in the pelvis. IMPRESSION: 1. Single living intrauterine gestation at 11 weeks 3 days by crown-rump length, with appropriate interval fetal growth. 2. No acute first-trimester gestational abnormality. Normal fetal cardiac activity. Electronically Signed   By: Delbert Phenix M.D.   On: 04/23/2017 11:44     Earlean Fidalgo,  Antionette Poles, MD 04/24/2017, 8:30 AM PGY-2, [facility] FPTS Intern pager: [phone], text pages welcome

## 2017-04-24 NOTE — Progress Notes (Signed)
This RN has been notified by central telemetry that patient has had two short bursts of SVT in the last hour then immediately back down to around 100.  Cardiology notified.  Patient asymptomatic, says she does not feel that her heart is racing.

## 2017-04-24 NOTE — Progress Notes (Signed)
Advanced Heart Failure Rounding Note   Subjective:    Weight down 4 pounds overnight with IV diuresis. Continues to feel SOB and tachycardic with any activity. + orthopnea.   TV u/s with 21 week old fetus. Plan is for therapeutic abortion. She has not been using birth control.  Objective:   Weight Range:  Vital Signs:   Temp:  [98 F (36.7 C)-98.5 F (36.9 C)] 98.5 F (36.9 C) (05/06 1205) Pulse Rate:  [88-108] 88 (05/06 1205) Resp:  [17-18] 18 (05/06 1205) BP: (108-131)/(56-69) 110/69 (05/06 1205) SpO2:  [96 %-98 %] 98 % (05/06 1205) Weight:  [118 kg (260 lb 1.6 oz)] 118 kg (260 lb 1.6 oz) (05/06 0429) Last BM Date: 04/23/17  Weight change: Filed Weights   04/23/17 0644 04/24/17 0429  Weight: 119.9 kg (264 lb 4.8 oz) 118 kg (260 lb 1.6 oz)    Intake/Output:   Intake/Output Summary (Last 24 hours) at 04/24/17 1315 Last data filed at 04/24/17 1023  Gross per 24 hour  Intake             1182 ml  Output             4100 ml  Net            -2918 ml     Physical Exam: General:  Obese young female. SOB with any activity HEENT: normal + exopthalmosis ? Mild ptosis on left Neck: supple. JVP hard to see due to massive thyromegaly  . Carotids 2+ bilat; no bruits.  Cor: PMI nondisplaced. Tachy regular  Lungs: crackles at bases Abdomen: obese soft, nontender, nondistended. No hepatosplenomegaly. No bruits or masses. Good bowel sounds. Extremities: no cyanosis, clubbing, rash, mild edema Neuro: alert & orientedx3, cranial nerves grossly intact. moves all 4 extremities w/o difficulty. Affect pleasant  Telemetry: Sinus 80s at rest. 110-120 with ambulating room. Personally reviewed   Labs: Basic Metabolic Panel:  Recent Labs Lab 04/22/17 2328 04/23/17 1028 04/24/17 0347  NA 136  --  137  K 3.6  --  3.2*  CL 105  --  104  CO2 22  --  24  GLUCOSE 94  --  96  BUN 10  --  9  CREATININE 0.51 0.43* 0.52  CALCIUM 9.3  --  8.6*  MG  --  1.5*  --     Liver  Function Tests: No results for input(s): AST, ALT, ALKPHOS, BILITOT, PROT, ALBUMIN in the last 168 hours. No results for input(s): LIPASE, AMYLASE in the last 168 hours. No results for input(s): AMMONIA in the last 168 hours.  CBC:  Recent Labs Lab 04/22/17 2328 04/23/17 1028 04/24/17 0347  WBC 7.6 6.7 6.8  HGB 10.7* 10.8* 10.2*  HCT 33.3* 33.7* 32.2*  MCV 70.7* 70.8* 70.5*  PLT 249 209 224    Cardiac Enzymes:  Recent Labs Lab 04/23/17 0304 04/23/17 1028 04/23/17 1901  TROPONINI <0.03 <0.03 <0.03    BNP: BNP (last 3 results)  Recent Labs  01/27/17 2047 03/08/17 1426 04/22/17 2328  BNP 194.0* 83.3 133.0*    ProBNP (last 3 results) No results for input(s): PROBNP in the last 8760 hours.    Other results:  Imaging: Dg Chest 2 View  Result Date: 04/22/2017 CLINICAL DATA:  Midsternal chest pain and dyspnea, patient is [redacted] weeks pregnant and was double shielded for imaging. EXAM: CHEST  2 VIEW COMPARISON:  None. FINDINGS: The heart size and mediastinal contours are within normal limits. Mild central vascular congestion  is noted with stable cardiomegaly. The visualized skeletal structures are unremarkable. IMPRESSION: Stable cardiomegaly with central vascular congestion consistent with mild pulmonary edema. Electronically Signed   By: Tollie Eth M.D.   On: 04/22/2017 23:59   US Ob Comp Less 14 Wks  Result Date: 04/23/2017 CLINICAL DATA:  26 year old pregnant female with tachycardia and history of CHF. EDC by first sonogram: 11/10/2017, projecting to an expected gestational age of [redacted] weeks 2 days. EXAM: OBSTETRIC <14 WK [place] AND TRANSVAGINAL OB US TECHNIQUE: Both transabdominal and transvaginal ultrasound examinations were performed for complete evaluation of the gestation as well as the maternal uterus, adnexal regions, and pelvic cul-de-sac. Transvaginal technique was performed to assess early pregnancy. COMPARISON:  03/29/2017 obstetric scan. FINDINGS: Intrauterine  gestational sac: Single intrauterine gestational sac is normal and size, shape and position. Yolk sac:  Visualized. Fetus: Visualized. Fetal anatomy not assessed at this early gestational age. Fetal Cardiac Activity: Regular rate and rhythm. Fetal Heart Rate: 144  bpm CRL:  45.4  mm   11 w   3 d                  [place] EDC: 11/09/2017 Subchorionic hemorrhage:  None visualized. Maternal uterus/adnexae: Anteverted uterus. No uterine fibroids demonstrated. Nonvisualization of the ovaries bilaterally. No adnexal masses demonstrated. No abnormal free fluid in the pelvis. IMPRESSION: 1. Single living intrauterine gestation at 11 weeks 3 days by crown-rump length, with appropriate interval fetal growth. 2. No acute first-trimester gestational abnormality. Normal fetal cardiac activity. Electronically Signed   By: Delbert Phenix M.D.   On: 04/23/2017 11:44   US Ob Transvaginal  Result Date: 04/23/2017 CLINICAL DATA:  26 year old pregnant female with tachycardia and history of CHF. EDC by first sonogram: 11/10/2017, projecting to an expected gestational age of [redacted] weeks 2 days. EXAM: OBSTETRIC <14 WK [place] AND TRANSVAGINAL OB US TECHNIQUE: Both transabdominal and transvaginal ultrasound examinations were performed for complete evaluation of the gestation as well as the maternal uterus, adnexal regions, and pelvic cul-de-sac. Transvaginal technique was performed to assess early pregnancy. COMPARISON:  03/29/2017 obstetric scan. FINDINGS: Intrauterine gestational sac: Single intrauterine gestational sac is normal and size, shape and position. Yolk sac:  Visualized. Fetus: Visualized. Fetal anatomy not assessed at this early gestational age. Fetal Cardiac Activity: Regular rate and rhythm. Fetal Heart Rate: 144  bpm CRL:  45.4  mm   11 w   3 d                  [place] EDC: 11/09/2017 Subchorionic hemorrhage:  None visualized. Maternal uterus/adnexae: Anteverted uterus. No uterine fibroids demonstrated. Nonvisualization of the ovaries  bilaterally. No adnexal masses demonstrated. No abnormal free fluid in the pelvis. IMPRESSION: 1. Single living intrauterine gestation at 11 weeks 3 days by crown-rump length, with appropriate interval fetal growth. 2. No acute first-trimester gestational abnormality. Normal fetal cardiac activity. Electronically Signed   By: Delbert Phenix M.D.   On: 04/23/2017 11:44      Medications:     Scheduled Medications: . docusate sodium  100 mg Oral BID  . enoxaparin (LOVENOX) injection  40 mg Subcutaneous Q24H  . furosemide  80 mg Intravenous BID  . labetalol  200 mg Oral BID  . methimazole  15 mg Oral TID  . potassium chloride  40 mEq Oral BID  . sodium chloride flush  3 mL Intravenous Q12H  . sodium chloride flush  3 mL Intravenous Q12H     Infusions: . sodium chloride  PRN Medications:  sodium chloride, sodium chloride flush   Assessment:   1. Acute HF - echo pending to assess EF.     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 EF 60-65% 2. Hyperthyroidism, untreated. 3. H/o pre-eclampsia 4. 11-week intrauterine pregnancy 5. Non-compliance with medical therapy 6. Morbid obesity 7. Hypokalemia/hypomagnesemia  Plan/Discussion:     She remains quite symptomatic. Unclear how much of this is from her HF versus untreated hyperthyroidism.   Would continue IV diuresis. Continue b-blocker and methimazole for hyperthyroidism. Will get echo to reassess EF. No ACE/ARB with pregnancy.  Agree with need for therapeutic abortion when more stable as she as at high high for morbidity/mortality to herself and fetus given h/o peripartum CM, untreated Graves dz and pre-eclampsia in past. Likely best to do this as an inpatient given h/o noncompliance with medical therapy.   Will supp K+.  Length of Stay: 1   Gelene Recktenwald 04/24/2017, 1:15 PM  Advanced Heart Failure Team Pager [phone] (M-F; 7a - 4p)  Please contact [facility] for night-coverage after  hours (4p -7a ) and weekends on [email or web address]

## 2017-04-24 NOTE — Progress Notes (Signed)
Patient stable during 7 a to 7 p shift.  Initially denied shortness of breath with exertion such as walking to the bathroom but admitted to becoming short of breath when showering earlier in the shift.  Continues to diurese.

## 2017-04-24 NOTE — Plan of Care (Signed)
Problem: Health Behavior/Discharge Planning: Goal: Ability to manage health-related needs will improve Outcome: Progressing Rn educated on low sodium foods, went over living with heart failure booklet   Problem: Physical Regulation: Goal: Ability to maintain clinical measurements within normal limits will improve Outcome: Progressing Oxygen saturation maintaining on room air, BP stable, HR remains tachy   Problem: Activity: Goal: Risk for activity intolerance will decrease Outcome: Progressing States her shortness of breath is much improved

## 2017-04-25 ENCOUNTER — Inpatient Hospital Stay (HOSPITAL_COMMUNITY): Payer: Medicaid Other

## 2017-04-25 DIAGNOSIS — I351 Nonrheumatic aortic (valve) insufficiency: Secondary | ICD-10-CM

## 2017-04-25 LAB — ECHOCARDIOGRAM COMPLETE
AVPHT: 359 ms
CHL CUP MV DEC (S): 176
CHL CUP TV REG PEAK VELOCITY: 332 cm/s
E/e' ratio: 5.11
EWDT: 176 ms
FS: 27 % — AB (ref 28–44)
Height: 66 in
IV/PV OW: 0.82
LA diam end sys: 37 mm
LA diam index: 1.67 cm/m2
LASIZE: 37 mm
LAVOLA4C: 62.7 mL
LDCA: 4.15 cm2
LV E/e'average: 5.11
LV TDI E'MEDIAL: 13.4
LV e' LATERAL: 27 cm/s
LVDIAVOL: 144 mL — AB (ref 46–106)
LVDIAVOLIN: 65 mL/m2
LVEEMED: 5.11
LVOT diameter: 23 mm
Lateral S' vel: 20.3 cm/s
MV Peak grad: 8 mmHg
MV pk A vel: 81.7 m/s
MVPKEVEL: 138 m/s
PW: 11 mm — AB (ref 0.6–1.1)
RV TAPSE: 28.7 mm
RV sys press: 47 mmHg
TDI e' lateral: 27
TR max vel: 332 cm/s
Weight: 4112 oz

## 2017-04-25 LAB — RENAL FUNCTION PANEL
Albumin: 2.9 g/dL — ABNORMAL LOW (ref 3.5–5.0)
Anion gap: 9 (ref 5–15)
BUN: 10 mg/dL (ref 6–20)
CHLORIDE: 102 mmol/L (ref 101–111)
CO2: 24 mmol/L (ref 22–32)
Calcium: 8.8 mg/dL — ABNORMAL LOW (ref 8.9–10.3)
Creatinine, Ser: 0.49 mg/dL (ref 0.44–1.00)
GFR calc Af Amer: >60 mL/min (ref >60)
GFR calc non Af Amer: >60 mL/min (ref >60)
GLUCOSE: 105 mg/dL — AB (ref 65–99)
POTASSIUM: 3.4 mmol/L — AB (ref 3.5–5.1)
Phosphorus: 4.4 mg/dL (ref 2.5–4.6)
Sodium: 135 mmol/L (ref 135–145)

## 2017-04-25 LAB — CBC
HCT: 33.6 % — ABNORMAL LOW (ref 36.0–46.0)
Hemoglobin: 10.6 g/dL — ABNORMAL LOW (ref 12.0–15.0)
MCH: 22.2 pg — AB (ref 26.0–34.0)
MCHC: 31.5 g/dL (ref 30.0–36.0)
MCV: 70.3 fL — AB (ref 78.0–100.0)
PLATELETS: 229 10*3/uL (ref 150–400)
RBC: 4.78 MIL/uL (ref 3.87–5.11)
RDW: 13.8 % (ref 11.5–15.5)
WBC: 7.6 10*3/uL (ref 4.0–10.5)

## 2017-04-25 LAB — MAGNESIUM: Magnesium: 1.8 mg/dL (ref 1.7–2.4)

## 2017-04-25 IMAGING — CR DG CHEST 1V PORT
1 series · 1 of 1 positions shown · non-contrast
Comparison: Study obtained earlier in the day

CLINICAL DATA: Hypoxia.  Central catheter placement

EXAM:
PORTABLE CHEST 1 VIEW

[portable]
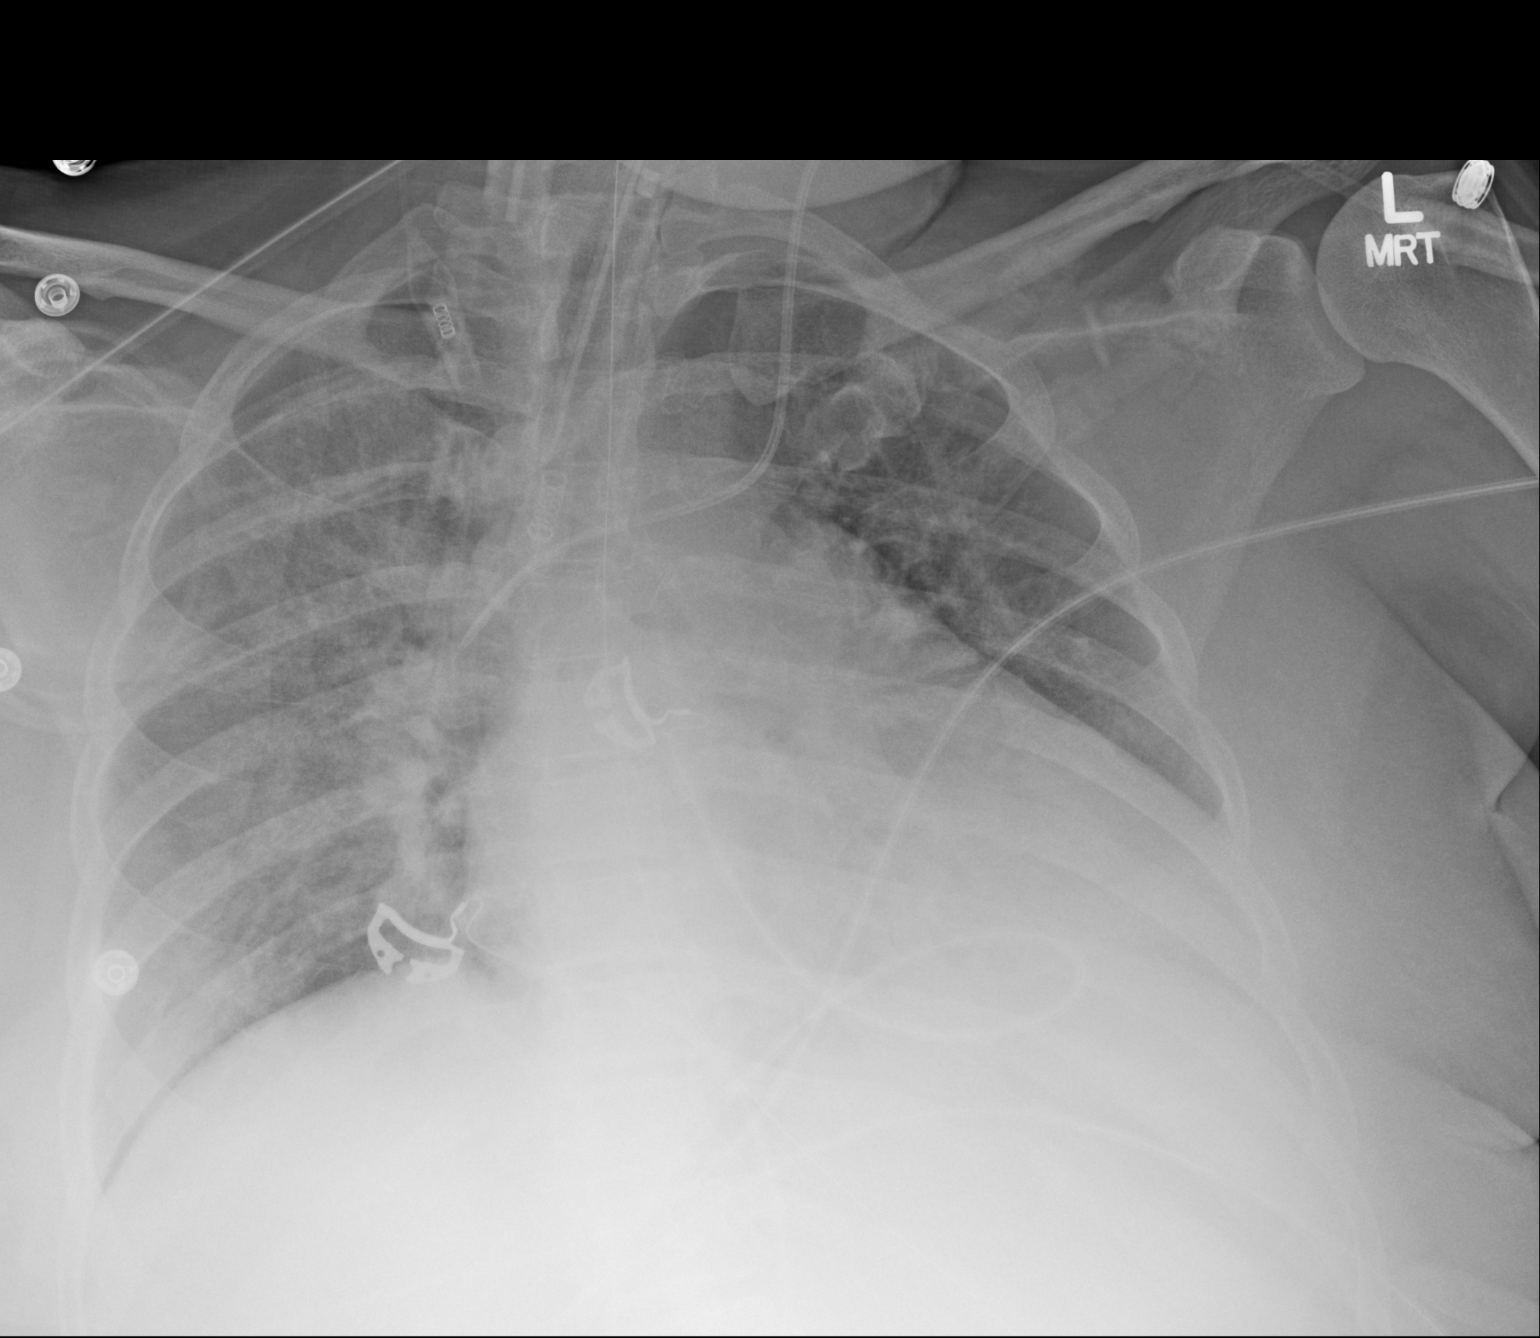

[1 of 1 positions shown; findings below may reference images not displayed]

FINDINGS: Central catheter tip is in the superior vena cava. Endotracheal tube
tip is 3.1 cm above the carina. Nasogastric tube tip and side port
are below the diaphragm. No pneumothorax. There is patchy
interstitial and alveolar edema, essentially stable. There is
cardiomegaly with pulmonary venous hypertension. No adenopathy
evident.
IMPRESSION: Tube and catheter positions as described without pneumothorax.
Persistent interstitial and patchy alveolar opacity, felt to be
consistent with edema/in chest of heart failure. Stable cardiac
silhouette. Should be noted that pneumonia superimposed on edema
cannot be excluded radiographically.

## 2017-04-25 MED ORDER — POTASSIUM CHLORIDE CRYS ER 20 MEQ PO TBCR: 40.0000 meq | EXTENDED_RELEASE_TABLET | Freq: Once | ORAL | Status: DC

## 2017-04-25 MED ORDER — POTASSIUM CHLORIDE CRYS ER 20 MEQ PO TBCR
40.0000 meq | EXTENDED_RELEASE_TABLET | Freq: Once | ORAL | Status: AC
  Administered 2017-04-25: 40 meq via ORAL
  Filled 2017-04-25: qty 2

## 2017-04-25 MED ORDER — MAGNESIUM SULFATE IN D5W 1-5 GM/100ML-% IV SOLN
1.0000 g | Freq: Once | INTRAVENOUS | Status: AC
  Administered 2017-04-25: 1 g via INTRAVENOUS
  Filled 2017-04-25: qty 100
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
## 2017-04-25 NOTE — Progress Notes (Signed)
Family Medicine Teaching Service Daily Progress Note Intern Pager: [phone]  Patient name: Tara [name] Medical record number: [id number] Date of birth: August 07, 1991 Age: 26 y.o. Gender: female  Primary Care Provider: Garth Bigness, MD Consultants: Cardiology, OBGYN Code Status: Full  Pt Overview and Major Events to Date:  5/5 admitted with tachycardia and fluid overload 5/6 -4 lbs after IV lasix 80mg  BID   Assessment and Plan: Tara [name] is a 26 y.o. female presenting with shortness of breath x1 week . PMH is significant for confirmed IUP at 11 weeks, CHF, obesity, and hyperthyroidism with Graves disease.    CHF exacerbation: Euvolemic on examination. 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 D-dimer obtained and was 0.85 (cutoff D dimer in first trimester for normal d-dimer <0.95; however d-dimer is not the most reliable test during pregnancy).  ACS r/o with troponin negative x 3 and EKG without ST elevation/depression.  Dry weight between 240- 250 lbs. Weight down 7 lbs from admission. I/o -3.5L since admission.  -Telemetry  -Cardiology following, per cardiology lasix 80 IV BID  -Daily weights -strict I's and O's -AM CBC, BMET  -replete Mag, K x 1   G6P5 at 11 weeks: Transvag US with 11w fetus. bHcg consistent with 11w.  -CSW consult for resources -OBGYN following, plans for [facility], earliest likely 5/8 due to needing cardiac clearance and echo  Tachycardia: On admission tachycardic to 100-110, improved over the last 24 hours. Likely 2/2 to uncontrolled hyperthyroidism.  -Labetalol 200 mg BID -Monitor HR and blood pressures   Hyperthyroidism with Graves disease, uncontrolled. TSH 0.0001, T4 5.51, T3 29.6. Does not take home Methimazole 15 mg TID.Due to plans for [facility] will continue Methimazole.  - Continue Methimazole 15 mg TID  - OB consulted  Social  Patient states this is an unplanned pregnancy that she  does not wish to keep. Explained situation to CM, who said financial aid should see her while she is hospitalized. She thought if EF is worsened she may qualify for Medicaid.  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 Patient feels better in terms of respiratory status, endorses frequent urination.   Objective: Temp:  [98.1 F (36.7 C)-98.5 F (36.9 C)] 98.1 F (36.7 C) (05/07 0506) Pulse Rate:  [88-110] 101 (05/07 0506) Resp:  [18-20] 18 (05/07 0506) BP: (108-137)/(64-69) 137/64 (05/07 0506) SpO2:  [94 %-98 %] 95 % (05/07 0506) Weight:  [257 lb (116.6 kg)] 257 lb (116.6 kg) (05/07 0506) Physical Exam: General: Sitting in chair, NAD, room air Cardiovascular: RRR, no murmurs, rubs or gallops Respiratory: CTAB, no crackles noted  Abdomen: BS+ no ttp Extremities: 1+ lower extremity edema, no hip edema noted either   Laboratory:  Recent Labs Lab 04/23/17 1028 04/24/17 0347 04/25/17 0342  WBC 6.7 6.8 7.6  HGB 10.8* 10.2* 10.6*  HCT 33.7* 32.2* 33.6*  PLT 209 224 229    Recent Labs Lab 04/22/17 2328 04/23/17 1028 04/24/17 0347 04/25/17 0342  NA 136  --  137 135  K 3.6  --  3.2* 3.4*  CL 105  --  104 102  CO2 22  --  24 24  BUN 10  --  9 10  CREATININE 0.51 0.43* 0.52 0.49  CALCIUM 9.3  --  8.6* 8.8*  GLUCOSE 94  --  96 105*  Imaging/Diagnostic Tests: US Ob Comp Less 14 Wks  Result Date: 04/23/2017 CLINICAL DATA:  26 year old pregnant female with tachycardia and history of CHF. EDC by first sonogram: 11/10/2017, projecting to an expected gestational age of [redacted] weeks 2 days. EXAM: OBSTETRIC <14 WK [place] AND TRANSVAGINAL OB US TECHNIQUE: Both transabdominal and transvaginal ultrasound examinations were performed for complete evaluation of the gestation as well as the maternal uterus, adnexal regions,  and pelvic cul-de-sac. Transvaginal technique was performed to assess early pregnancy. COMPARISON:  03/29/2017 obstetric scan. FINDINGS: Intrauterine gestational sac: Single intrauterine gestational sac is normal and size, shape and position. Yolk sac:  Visualized. Fetus: Visualized. Fetal anatomy not assessed at this early gestational age. Fetal Cardiac Activity: Regular rate and rhythm. Fetal Heart Rate: 144  bpm CRL:  45.4  mm   11 w   3 d                  [place] EDC: 11/09/2017 Subchorionic hemorrhage:  None visualized. Maternal uterus/adnexae: Anteverted uterus. No uterine fibroids demonstrated. Nonvisualization of the ovaries bilaterally. No adnexal masses demonstrated. No abnormal free fluid in the pelvis. IMPRESSION: 1. Single living intrauterine gestation at 11 weeks 3 days by crown-rump length, with appropriate interval fetal growth. 2. No acute first-trimester gestational abnormality. Normal fetal cardiac activity. Electronically Signed   By: Delbert Phenix M.D.   On: 04/23/2017 11:44   US Ob Transvaginal  Result Date: 04/23/2017 CLINICAL DATA:  26 year old pregnant female with tachycardia and history of CHF. EDC by first sonogram: 11/10/2017, projecting to an expected gestational age of [redacted] weeks 2 days. EXAM: OBSTETRIC <14 WK [place] AND TRANSVAGINAL OB US TECHNIQUE: Both transabdominal and transvaginal ultrasound examinations were performed for complete evaluation of the gestation as well as the maternal uterus, adnexal regions, and pelvic cul-de-sac. Transvaginal technique was performed to assess early pregnancy. COMPARISON:  03/29/2017 obstetric scan. FINDINGS: Intrauterine gestational sac: Single intrauterine gestational sac is normal and size, shape and position. Yolk sac:  Visualized. Fetus: Visualized. Fetal anatomy not assessed at this early gestational age. Fetal Cardiac Activity: Regular rate and rhythm. Fetal Heart Rate: 144  bpm CRL:  45.4  mm   11 w   3 d                  [place] EDC: 11/09/2017  Subchorionic hemorrhage:  None visualized. Maternal uterus/adnexae: Anteverted uterus. No uterine fibroids demonstrated. Nonvisualization of the ovaries bilaterally. No adnexal masses demonstrated. No abnormal free fluid in the pelvis. IMPRESSION: 1. Single living intrauterine gestation at 11 weeks 3 days by crown-rump length, with appropriate interval fetal growth. 2. No acute first-trimester gestational abnormality. Normal fetal cardiac activity. Electronically Signed   By: Delbert Phenix M.D.   On: 04/23/2017 11:44    Garth Bigness, MD 04/25/2017, 7:00 AM PGY-1, [facility] Family Medicine FPTS Intern pager: [phone], text pages welcome

## 2017-04-25 NOTE — Progress Notes (Signed)
Advanced Heart Failure Rounding Note   Subjective:    Weight down another 3 pounds with IV lasix.   SOB with exertion but feeling better. Still tachycardic. Frustrated wants to leave to take nurse tech exam.   TV u/s with 49 week old fetus. Plan is for therapeutic abortion. She has not been using birth control.  Objective:   Weight Range:  Vital Signs:   Temp:  [98.1 F (36.7 C)-98.5 F (36.9 C)] 98.1 F (36.7 C) (05/07 0506) Pulse Rate:  [88-110] 101 (05/07 0506) Resp:  [18-20] 18 (05/07 0506) BP: (108-137)/(64-69) 137/64 (05/07 0506) SpO2:  [94 %-98 %] 95 % (05/07 0506) Weight:  [257 lb (116.6 kg)] 257 lb (116.6 kg) (05/07 0506) Last BM Date: 04/23/17  Weight change: Filed Weights   04/23/17 0644 04/24/17 0429 04/25/17 0506  Weight: 264 lb 4.8 oz (119.9 kg) 260 lb 1.6 oz (118 kg) 257 lb (116.6 kg)    Intake/Output:   Intake/Output Summary (Last 24 hours) at 04/25/17 0749 Last data filed at 04/25/17 0505  Gross per 24 hour  Intake              720 ml  Output             2701 ml  Net            -1981 ml     Physical Exam: General:  Obese, in bed.  HEENT: normal + exopthalmosis. Mild ptosis on left.  Neck: supple. JVP to jaw Carotids 2+ bilat; no bruits. Massive thryomegaly Cor: PMI nondisplaced. Tachy  rate & rhythm. No rubs, gallops or murmurs. Lungs: clear Abdomen: obese soft, nontender, nondistended. No hepatosplenomegaly. No bruits or masses. Good bowel sounds. Extremities: no cyanosis, clubbing, rash, trace edema Neuro: alert & orientedx3, cranial nerves grossly intact. moves all 4 extremities w/o difficulty. Affect pleasant   Telemetry: Sinus Tach 110s. Personally reviewed   Labs: Basic Metabolic Panel:  Recent Labs Lab 04/22/17 2328 04/23/17 1028 04/24/17 0347 04/25/17 0342  NA 136  --  137 135  K 3.6  --  3.2* 3.4*  CL 105  --  104 102  CO2 22  --  24 24  GLUCOSE 94  --  96 105*  BUN 10  --  9 10  CREATININE 0.51 0.43* 0.52 0.49    CALCIUM 9.3  --  8.6* 8.8*  MG  --  1.5*  --  1.8  PHOS  --   --   --  4.4    Liver Function Tests:  Recent Labs Lab 04/25/17 0342  ALBUMIN 2.9*   No results for input(s): LIPASE, AMYLASE in the last 168 hours. No results for input(s): AMMONIA in the last 168 hours.  CBC:  Recent Labs Lab 04/22/17 2328 04/23/17 1028 04/24/17 0347 04/25/17 0342  WBC 7.6 6.7 6.8 7.6  HGB 10.7* 10.8* 10.2* 10.6*  HCT 33.3* 33.7* 32.2* 33.6*  MCV 70.7* 70.8* 70.5* 70.3*  PLT 249 209 224 229    Cardiac Enzymes:  Recent Labs Lab 04/23/17 0304 04/23/17 1028 04/23/17 1901  TROPONINI <0.03 <0.03 <0.03    BNP: BNP (last 3 results)  Recent Labs  01/27/17 2047 03/08/17 1426 04/22/17 2328  BNP 194.0* 83.3 133.0*    ProBNP (last 3 results) No results for input(s): PROBNP in the last 8760 hours.    Other results:  Imaging: US Ob Comp Less 14 Wks  Result Date: 04/23/2017 CLINICAL DATA:  26 year old pregnant female with tachycardia and history  of CHF. EDC by first sonogram: 11/10/2017, projecting to an expected gestational age of [redacted] weeks 2 days. EXAM: OBSTETRIC <14 WK [place] AND TRANSVAGINAL OB US TECHNIQUE: Both transabdominal and transvaginal ultrasound examinations were performed for complete evaluation of the gestation as well as the maternal uterus, adnexal regions, and pelvic cul-de-sac. Transvaginal technique was performed to assess early pregnancy. COMPARISON:  03/29/2017 obstetric scan. FINDINGS: Intrauterine gestational sac: Single intrauterine gestational sac is normal and size, shape and position. Yolk sac:  Visualized. Fetus: Visualized. Fetal anatomy not assessed at this early gestational age. Fetal Cardiac Activity: Regular rate and rhythm. Fetal Heart Rate: 144  bpm CRL:  45.4  mm   11 w   3 d                  [place] EDC: 11/09/2017 Subchorionic hemorrhage:  None visualized. Maternal uterus/adnexae: Anteverted uterus. No uterine fibroids demonstrated. Nonvisualization of the  ovaries bilaterally. No adnexal masses demonstrated. No abnormal free fluid in the pelvis. IMPRESSION: 1. Single living intrauterine gestation at 11 weeks 3 days by crown-rump length, with appropriate interval fetal growth. 2. No acute first-trimester gestational abnormality. Normal fetal cardiac activity. Electronically Signed   By: Delbert Phenix M.D.   On: 04/23/2017 11:44   US Ob Transvaginal  Result Date: 04/23/2017 CLINICAL DATA:  26 year old pregnant female with tachycardia and history of CHF. EDC by first sonogram: 11/10/2017, projecting to an expected gestational age of [redacted] weeks 2 days. EXAM: OBSTETRIC <14 WK [place] AND TRANSVAGINAL OB US TECHNIQUE: Both transabdominal and transvaginal ultrasound examinations were performed for complete evaluation of the gestation as well as the maternal uterus, adnexal regions, and pelvic cul-de-sac. Transvaginal technique was performed to assess early pregnancy. COMPARISON:  03/29/2017 obstetric scan. FINDINGS: Intrauterine gestational sac: Single intrauterine gestational sac is normal and size, shape and position. Yolk sac:  Visualized. Fetus: Visualized. Fetal anatomy not assessed at this early gestational age. Fetal Cardiac Activity: Regular rate and rhythm. Fetal Heart Rate: 144  bpm CRL:  45.4  mm   11 w   3 d                  [place] EDC: 11/09/2017 Subchorionic hemorrhage:  None visualized. Maternal uterus/adnexae: Anteverted uterus. No uterine fibroids demonstrated. Nonvisualization of the ovaries bilaterally. No adnexal masses demonstrated. No abnormal free fluid in the pelvis. IMPRESSION: 1. Single living intrauterine gestation at 11 weeks 3 days by crown-rump length, with appropriate interval fetal growth. 2. No acute first-trimester gestational abnormality. Normal fetal cardiac activity. Electronically Signed   By: Delbert Phenix M.D.   On: 04/23/2017 11:44     Medications:     Scheduled Medications: . docusate sodium  100 mg Oral BID  . enoxaparin (LOVENOX)  injection  40 mg Subcutaneous Q24H  . furosemide  80 mg Intravenous BID  . labetalol  200 mg Oral BID  . methimazole  15 mg Oral TID  . potassium chloride  40 mEq Oral BID  . potassium chloride  40 mEq Oral Once  . sodium chloride flush  3 mL Intravenous Q12H  . sodium chloride flush  3 mL Intravenous Q12H    Infusions: . sodium chloride    . magnesium sulfate 1 - 4 g bolus IVPB      PRN Medications: sodium chloride, sodium chloride flush   Assessment:   1. Acute HF - echo pending to assess EF.     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 EF 60-65% 2. Hyperthyroidism, untreated. 3. H/o pre-eclampsia 4. 11-week intrauterine pregnancy 5. Non-compliance with medical therapy 6. Morbid obesity 7. Hypokalemia/hypomagnesemia  Plan/Discussion:    HF meds limited with pregnancy (no arb, spiro). Continue labetalol 200 mg twice a day. ECHO pending.  Renal function stable. Give 40 meq potassium daily.   Continue hyperthyroid treatment with methimazole. Needs treatment for hyperthyroidism.   Planning pregnancy termination. ? Inpatient versus outpatient.   I am concerned about follow up due to transportation. She has minimal resources. I called her Nurse Tech Instructor, Ms Dimple Casey at [facility] request to let her know she is in the hospital. Carlye Grippe is very frustrated and wants to leave today and take Nurse Tech exam. I dont think that is in her best interest.    Length of Stay: 2   Amy Clegg NP-C 04/25/2017, 7:49 AM  Advanced Heart Failure Team Pager [phone] (M-F; 7a - 4p)  Please contact [facility] for night-coverage after hours (4p -7a ) and weekends on [email or web address]  Patient seen and examined with Tonye Becket, NP. We discussed all aspects of the encounter. I agree with the assessment and plan as stated above.   Volume status better but still with some overload. Renal function stable.   She remains tachycardic and I am concerned that EF may be down but may also  be due to hyperthyroidism. Have called ECHO lab to move her up on schedule.  Continue labetalol and methimazole.   Agree with need for therapeutic abortion given risk to her life and the fetuses if she continues pregnancy.   Supp K+. Discussed with pharmD.  Arvilla Meres, MD  8:19 AM

## 2017-04-25 NOTE — Progress Notes (Signed)
  Echocardiogram 2D Echocardiogram has been performed.  Tara [name] 04/25/2017, 10:15 AM

## 2017-04-25 NOTE — Progress Notes (Signed)
Pt. Requesting work excuse for family member caring for her children tonight. On call MD, Turner, paged to make aware. RN able to locate letter in pt. Chart to confirm pts. Status as inpatient at pt. Request. Letter given to pt.

## 2017-04-25 NOTE — Clinical Social Work Note (Signed)
CSW acknowledges consult "Admission likely 2/2 inability to afford meds. Needs endocrinology as an outpatient due to severe Graves disease likely exacerbating her CHF. She is unable to get in with an endocrinologist due to lack of insurance. Additionally, she likely needs thyroidectomy with general surgery, but needs to see Endo first." Will notify RNCM.  CSW signing off. Consult again if any social work needs arise.  Charlynn Court, CSW [phone]

## 2017-04-26 DIAGNOSIS — I5031 Acute diastolic (congestive) heart failure: Secondary | ICD-10-CM

## 2017-04-26 DIAGNOSIS — O99281 Endocrine, nutritional and metabolic diseases complicating pregnancy, first trimester: Secondary | ICD-10-CM

## 2017-04-26 LAB — CBC
HEMATOCRIT: 32.9 % — AB (ref 36.0–46.0)
HEMOGLOBIN: 10.5 g/dL — AB (ref 12.0–15.0)
MCH: 22.3 pg — ABNORMAL LOW (ref 26.0–34.0)
MCHC: 31.9 g/dL (ref 30.0–36.0)
MCV: 70 fL — AB (ref 78.0–100.0)
Platelets: 233 10*3/uL (ref 150–400)
RBC: 4.7 MIL/uL (ref 3.87–5.11)
RDW: 13.8 % (ref 11.5–15.5)
WBC: 6.8 10*3/uL (ref 4.0–10.5)

## 2017-04-26 LAB — RENAL FUNCTION PANEL
ALBUMIN: 2.9 g/dL — AB (ref 3.5–5.0)
Anion gap: 9 (ref 5–15)
BUN: 10 mg/dL (ref 6–20)
CALCIUM: 8.7 mg/dL — AB (ref 8.9–10.3)
CHLORIDE: 102 mmol/L (ref 101–111)
CO2: 24 mmol/L (ref 22–32)
CREATININE: 0.51 mg/dL (ref 0.44–1.00)
GFR calc Af Amer: >60 mL/min (ref >60)
Glucose, Bld: 92 mg/dL (ref 65–99)
PHOSPHORUS: 4.4 mg/dL (ref 2.5–4.6)
Potassium: 3.4 mmol/L — ABNORMAL LOW (ref 3.5–5.1)
SODIUM: 135 mmol/L (ref 135–145)

## 2017-04-26 LAB — MAGNESIUM: MAGNESIUM: 1.8 mg/dL (ref 1.7–2.4)

## 2017-04-26 IMAGING — CR DG CHEST 1V PORT
1 series · 1 of 1 positions shown · non-contrast
Comparison: 10/28/2016

CLINICAL DATA: Endotracheal tube.  Shortness of breath.

EXAM:
PORTABLE CHEST 1 VIEW

[AP]
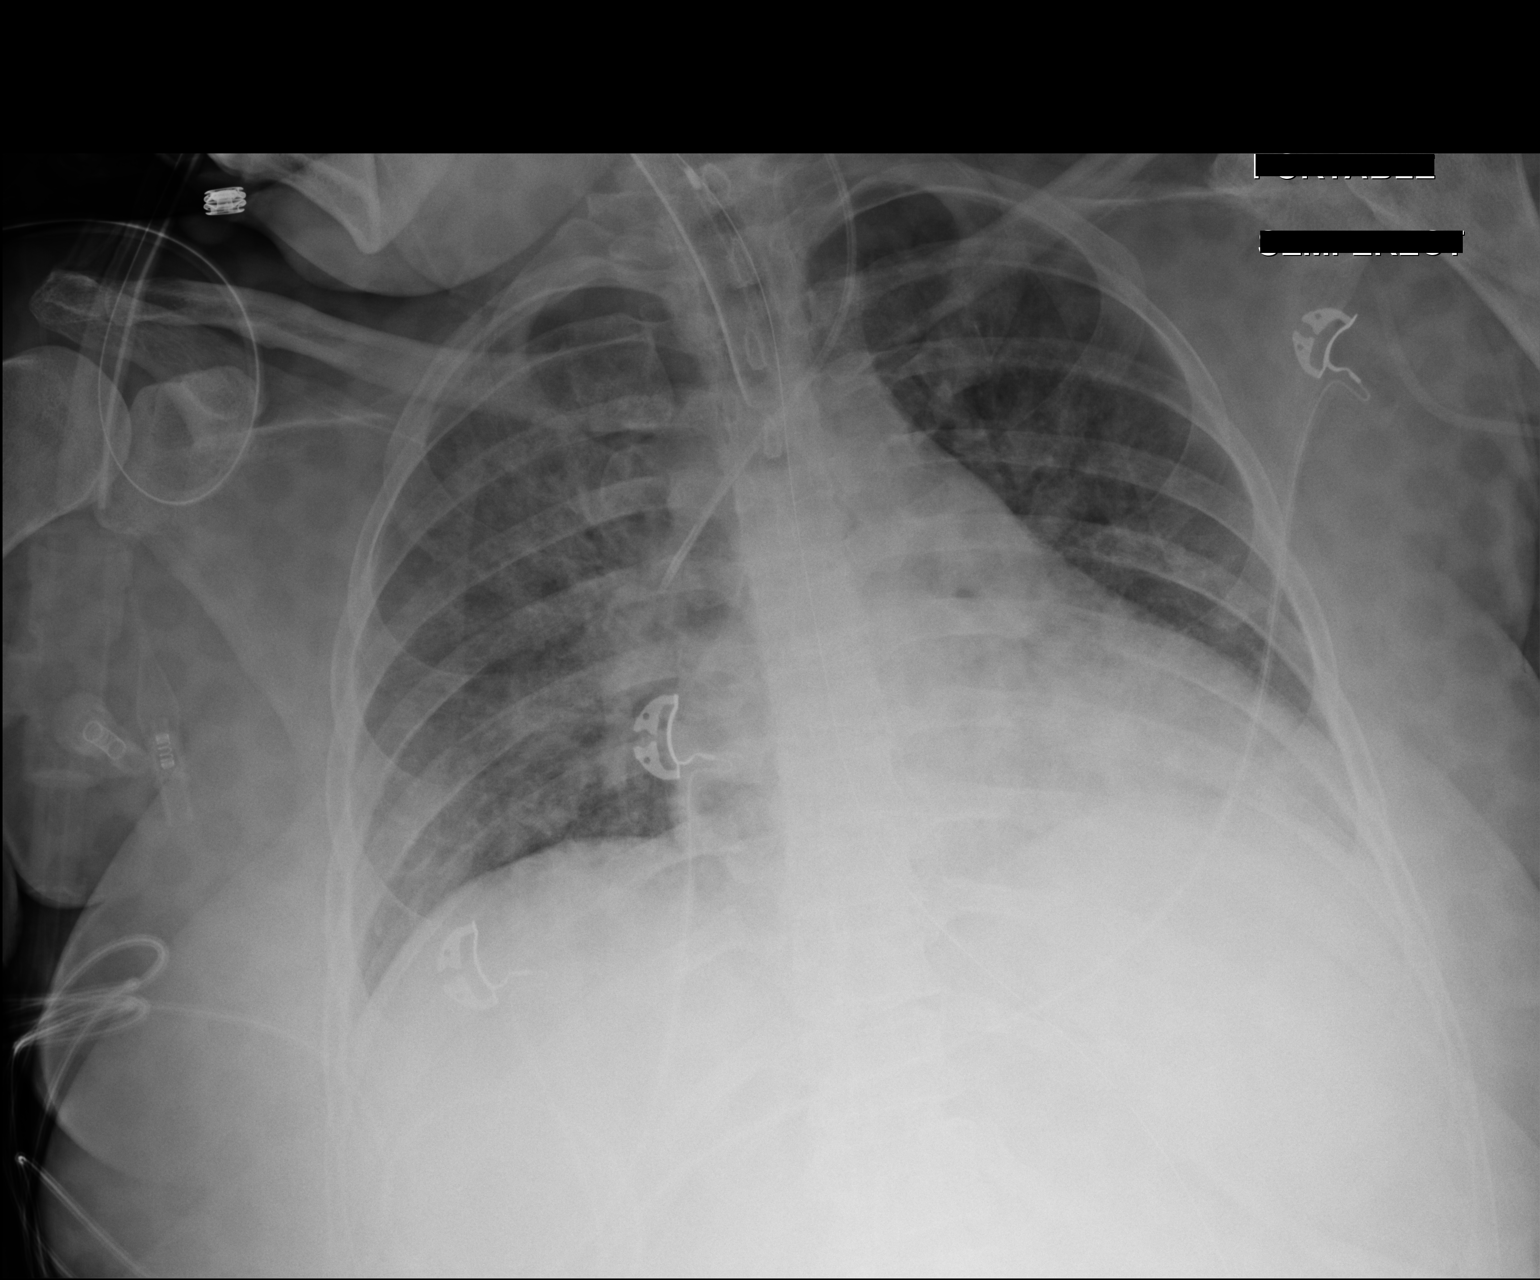

[1 of 1 positions shown; findings below may reference images not displayed]

FINDINGS: Endotracheal tube remains in place with tip 2.5 cm above the carina.
The left jugular venous catheter remains in place with tip overlying
the SVC. Enteric tube courses into the left upper abdomen with tip
not imaged. The cardiomediastinal silhouette is unchanged. Perihilar
predominant bilateral airspace opacities have minimally improved. No
sizable pleural effusion or pneumothorax is identified.
IMPRESSION: Slight further improvement of bilateral airspace disease.

## 2017-04-26 MED ORDER — POTASSIUM CHLORIDE CRYS ER 20 MEQ PO TBCR
20.0000 meq | EXTENDED_RELEASE_TABLET | Freq: Every day | ORAL | Status: DC
  Administered 2017-04-26: 20 meq via ORAL
  Filled 2017-04-26: qty 1

## 2017-04-26 MED ORDER — POTASSIUM CHLORIDE CRYS ER 20 MEQ PO TBCR
40.0000 meq | EXTENDED_RELEASE_TABLET | Freq: Once | ORAL | Status: AC
  Administered 2017-04-26: 40 meq via ORAL
  Filled 2017-04-26: qty 2

## 2017-04-26 MED ORDER — FUROSEMIDE 40 MG PO TABS: 40.0000 mg | ORAL_TABLET | Freq: Every day | ORAL | 1 refills | Status: DC

## 2017-04-26 MED ORDER — POTASSIUM CHLORIDE CRYS ER 20 MEQ PO TBCR: 20.0000 meq | EXTENDED_RELEASE_TABLET | Freq: Every day | ORAL | 0 refills | Status: DC

## 2017-04-26 MED ORDER — LABETALOL HCL 200 MG PO TABS: 200.0000 mg | ORAL_TABLET | Freq: Two times a day (BID) | ORAL | Status: DC

## 2017-04-26 MED ORDER — FUROSEMIDE 40 MG PO TABS
40.0000 mg | ORAL_TABLET | Freq: Every day | ORAL | Status: DC
  Administered 2017-04-26: 40 mg via ORAL
  Filled 2017-04-26: qty 1

## 2017-04-26 MED FILL — FUROSEMIDE 40 MG TABLET: 40 | 65 days supply | Qty: 65 | Fill #0

## 2017-04-26 NOTE — Progress Notes (Signed)
Advanced Heart Failure Rounding Note   Subjective:    Feeling better. States she has to go home today.   Denies dyspnea or orthopnea. Weight stable.   Echo reviewed personally EF 50-55%   Objective:   Weight Range:  Vital Signs:   Temp:  [98.2 F (36.8 C)-98.6 F (37 C)] 98.2 F (36.8 C) (05/08 0430) Pulse Rate:  [91-102] 91 (05/08 0430) Resp:  [18] 18 (05/08 0430) BP: (109-130)/(56-72) 109/56 (05/08 0430) SpO2:  [98 %-100 %] 100 % (05/08 0430) Weight:  [117 kg (258 lb)] 117 kg (258 lb) (05/08 0430) Last BM Date: 04/25/17  Weight change: Filed Weights   04/24/17 0429 04/25/17 0506 04/26/17 0430  Weight: 118 kg (260 lb 1.6 oz) 116.6 kg (257 lb) 117 kg (258 lb)    Intake/Output:   Intake/Output Summary (Last 24 hours) at 04/26/17 1610 Last data filed at 04/26/17 0430  Gross per 24 hour  Intake              480 ml  Output             2650 ml  Net            -2170 ml     Physical Exam: General:  Obese young woman, in bed NAD.  HEENT: normal + prominent exopthalmosis. Mild ptosis on left.  Neck: supple. JVP hard to see with thyroid  Carotids 2+ bilat; no bruits. Massive thryomegaly Cor: PMI nondisplaced. RRR No rubs, gallops or murmurs. Lungs: clear Abdomen: obese soft, NT/ND good BS  Extremities: no cyanosis, clubbing, rash, warm. No edema  Neuro: alert & orientedx3, cranial nerves grossly intact. moves all 4 extremities w/o difficulty. Affect pleasant   Telemetry: Sinus 90s. Personally reviewed    Labs: Basic Metabolic Panel:  Recent Labs Lab 04/22/17 2328 04/23/17 1028 04/24/17 0347 04/25/17 0342 04/26/17 0353  NA 136  --  137 135 135  K 3.6  --  3.2* 3.4* 3.4*  CL 105  --  104 102 102  CO2 22  --  24 24 24   GLUCOSE 94  --  96 105* 92  BUN 10  --  9 10 10   CREATININE 0.51 0.43* 0.52 0.49 0.51  CALCIUM 9.3  --  8.6* 8.8* 8.7*  MG  --  1.5*  --  1.8 1.8  PHOS  --   --   --  4.4 4.4    Liver Function Tests:  Recent Labs Lab  04/25/17 0342 04/26/17 0353  ALBUMIN 2.9* 2.9*   No results for input(s): LIPASE, AMYLASE in the last 168 hours. No results for input(s): AMMONIA in the last 168 hours.  CBC:  Recent Labs Lab 04/22/17 2328 04/23/17 1028 04/24/17 0347 04/25/17 0342 04/26/17 0353  WBC 7.6 6.7 6.8 7.6 6.8  HGB 10.7* 10.8* 10.2* 10.6* 10.5*  HCT 33.3* 33.7* 32.2* 33.6* 32.9*  MCV 70.7* 70.8* 70.5* 70.3* 70.0*  PLT 249 209 224 229 233    Cardiac Enzymes:  Recent Labs Lab 04/23/17 0304 04/23/17 1028 04/23/17 1901  TROPONINI <0.03 <0.03 <0.03    BNP: BNP (last 3 results)  Recent Labs  01/27/17 2047 03/08/17 1426 04/22/17 2328  BNP 194.0* 83.3 133.0*    ProBNP (last 3 results) No results for input(s): PROBNP in the last 8760 hours.    Other results:  Imaging: No results found.   Medications:     Scheduled Medications: . docusate sodium  100 mg Oral BID  . enoxaparin (LOVENOX) injection  40 mg Subcutaneous Q24H  . furosemide  80 mg Intravenous BID  . labetalol  200 mg Oral BID  . sodium chloride flush  3 mL Intravenous Q12H  . sodium chloride flush  3 mL Intravenous Q12H    Infusions: . sodium chloride      PRN Medications: sodium chloride, sodium chloride flush   Assessment:   1. Acute diastolic HF - echo pending to assess EF.     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 EF 60-65% 2. Hyperthyroidism, untreated. 3. H/o pre-eclampsia 4. 11-week intrauterine pregnancy 5. Non-compliance with medical therapy 6. Morbid obesity 7. Hypokalemia/hypomagnesemia  Plan/Discussion:    Echo reviewed personally EF well preserved 50-55% HR coming down with treatment of hyperthyroidism. Volume status improved today. She is adamant that she wants to go home today.    HF meds limited with pregnancy (no arb, spiro).   Continue labetalol 200 mg twice a day. Lasix 40 mg daily with KCL 20   Continue hyperthyroid treatment with methimazole. Needs  treatment for hyperthyroidism.   OK to proceed with therapeutic abortion. Can do as outpatient, if needed   We will sign off. Will arrange f/u in HF Clinic.   Length of Stay: 3   Arvilla Meres MD 04/26/2017, 6:19 AM  Advanced Heart Failure Team Pager [phone] (M-F; 7a - 4p)  Please contact [facility] for night-coverage after hours (4p -7a ) and weekends on [email or web address]

## 2017-04-26 NOTE — Progress Notes (Signed)
Paged MD regarding pt requesting to leave AMA Awaiting call back   Tara [name] Elige Radon

## 2017-04-26 NOTE — Progress Notes (Signed)
Transitions of Care Pharmacy Note  Plan:  Educated on labetalol, furosemide, and potassium  Addressed need to call PCP outpatient to resume methimazole  Addressed concerns related to cost of medications  Outpatient follow-up: resumption of methimazole, confirmation of OCP method  --------------------------------------------- Karel Jarvis is an 26 y.o. female who presents with a chief complaint of SOB. In anticipation of discharge, pharmacy has reviewed this patient's prior to admission medication history, as well as current inpatient medications listed per the [facility].  Current medication indications, dosing, frequency, and notable side effects reviewed with patient. patient verbalized understanding of current inpatient medication regimen and is aware that the After Visit Summary when presented, will represent the most accurate medication list at discharge.   Karel Jarvis expressed concerns regarding costs of her medications. We discussed the changes to her furosemide and potential need for potassium. She felt confident she could afford her furosemide and endorsed she was not taking potassium PTA due to the size of the tablets. Additionally, we spoke about the change in her BP medications from coreg to labetalol. The patient reports she has been on labetalol previously, and has some of her prescription remaining. She would prefer to take the prescription she has at home (so she doesn't need to pay for additional supply) while waiting to resume her carvedilol pending discussion with PCP outpatient. She is aware she will need to touch base with her PCP outpatient to resume her methimazole, discuss OCP plans, and ensure she can afford her medications.   She reports no further questions or concerns regarding her medications at the conclusion of our visit.    Assessment: Understanding of regimen: good Understanding of indications: good Potential of compliance: good Barriers to Obtaining  Medications: Yes (cost)  Patient instructed to contact inpatient pharmacy team with further questions or concerns if needed.    Time spent preparing for discharge counseling: 15 min  Time spent counseling patient: 15 min    York Cerise, PharmD Pharmacy Resident  Pager [phone] 04/26/17 9:23 AM

## 2017-04-26 NOTE — Progress Notes (Signed)
MD called and stated pt might possibly have surgery at [facility] hospital today. If not today will be tomorrow. Asked MD if pt will go by Sharin Mons, MD stated pt needs 3 bus passes to go to hospital, home and follow up apt. Social worker aware.   Jeramie Scogin [employer]

## 2017-04-26 NOTE — Progress Notes (Signed)
Paged MD regarding pt stating she cannot have procedure today, needs to be discharged so she can attend class. Pt stated she has told MD about this. Pt also states she need letter for work.  Awaiting call back  Tara [name]

## 2017-04-26 NOTE — Progress Notes (Signed)
Resident at bedside states she will discharge pt and provide note excusing her from work. Called social work and asked if she could deliver the taxi passes.   Myan Locatelli Elige Radon

## 2017-04-26 NOTE — Progress Notes (Signed)
Family Medicine Teaching Service Daily Progress Note Intern Pager: [phone]  Patient name: Tara [name] Medical record number: [id number] Date of birth: 1991/12/06 Age: 26 y.o. Gender: female  Primary Care Provider: Garth Bigness, MD Consultants: Cardiology, OBGYN Code Status: Full  Pt Overview and Major Events to Date:  5/5 admitted with tachycardia and fluid overload 5/6 -4 lbs after IV lasix 80mg  BID  5/8 cards cleared for D&C  Assessment and Plan: AMADIS FUHRER is a 26 y.o. female presenting with shortness of breath x1 week . PMH is significant for confirmed IUP at 11 weeks, CHF, obesity, and hyperthyroidism with Graves disease.    CHF exacerbation: Euvolemic on examination. 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 Weight down 7 lbs from admission. I/o -3.5L since admission.  -Telemetry  -Cardiology following: continue labetalol, lasix 40mg  KCl daily -Daily weights -strict I's and O's -AM CBC, BMET    G6P5 at 11 weeks: [place] with 11w fetus. bHcg consistent with 11w.  -OBGYN following, plans for D&C, Dr. Jolayne Panther to do 5/9 afternoon  Tachycardia: On admission tachycardic to 100-110, improved over the last 24 hours. Likely 2/2 to uncontrolled hyperthyroidism.  -Labetalol 200 mg BID -Monitor HR and blood pressures   Hyperthyroidism with Graves disease, uncontrolled. TSH 0.0001, T4 5.51, T3 29.6. Does not take home Methimazole 15 mg TID.Due to plans for D&C.  - OB consulted - plan to restart Methimazole once d&c completed  Social  Patient states this is an unplanned pregnancy that she does not wish to keep. Explained situation to CM, who said financial aid should see her while she is hospitalized. She thought if EF is worsened she may qualify for Medicaid.  -Consult to CSW   FEN/GI: SLIV, Regular diet Prophylaxis: Lovenox   Disposition: possible discharge home today  Subjective:  Patient  wants to go home today to care for her children. She says that she will be able to get an Benedetto Goad to her procedure tomorrow afternoon at [facility] because she just got paid.   Objective: Temp:  [98.2 F (36.8 C)-98.6 F (37 C)] 98.2 F (36.8 C) (05/08 0430) Pulse Rate:  [91-102] 91 (05/08 0430) Resp:  [18] 18 (05/08 0430) BP: (109-130)/(56-72) 109/56 (05/08 0430) SpO2:  [98 %-100 %] 100 % (05/08 0430) Weight:  [258 lb (117 kg)] 258 lb (117 kg) (05/08 0430) Physical Exam: General: Sitting in chair, NAD, room air Cardiovascular: RRR, no murmurs, rubs or gallops Respiratory: CTAB, no crackles noted  Abdomen: BS+ no ttp Extremities: no lower extremity edema  Laboratory:  Recent Labs Lab 04/24/17 0347 04/25/17 0342 04/26/17 0353  WBC 6.8 7.6 6.8  HGB 10.2* 10.6* 10.5*  HCT 32.2* 33.6* 32.9*  PLT 224 229 233    Recent Labs Lab 04/24/17 0347 04/25/17 0342 04/26/17 0353  NA 137 135 135  K 3.2* 3.4* 3.4*  CL 104 102 102  CO2 24 24 24   BUN 9 10 10   CREATININE 0.52 0.49 0.51  CALCIUM 8.6* 8.8* 8.7*  GLUCOSE 96 105* 92    Imaging/Diagnostic Tests: No results found.  Garth Bigness, MD 04/26/2017, 7:14 AM PGY-1, [facility] Family Medicine FPTS Intern pager: [phone], text pages welcome

## 2017-04-26 NOTE — Progress Notes (Addendum)
MD stated pt has labetalol at home.  Pt IV discontinued, catheter intact and telemetry removed. Pt has all belongings. Pt discharge education provided at bedside. Pt has all discharge paper work, Loss adjuster, chartered from work and three bus passes. Pt discharged via wheelchair with staff member  Emmalynn Pinkham Elige Radon

## 2017-04-27 ENCOUNTER — Telehealth: Payer: Self-pay | Admitting: Licensed Clinical Social Worker

## 2017-04-27 ENCOUNTER — Telehealth: Payer: Self-pay | Admitting: *Deleted

## 2017-04-27 IMAGING — CR DG ABD PORTABLE 1V
1 series · 1 of 1 positions shown · non-contrast
Comparison: 10/27/2016

CLINICAL DATA: Cardiomyopathy, pulmonary edema and respiratory
failure. Status post nasogastric tube placement.

EXAM:
PORTABLE ABDOMEN - 1 VIEW

[AP]
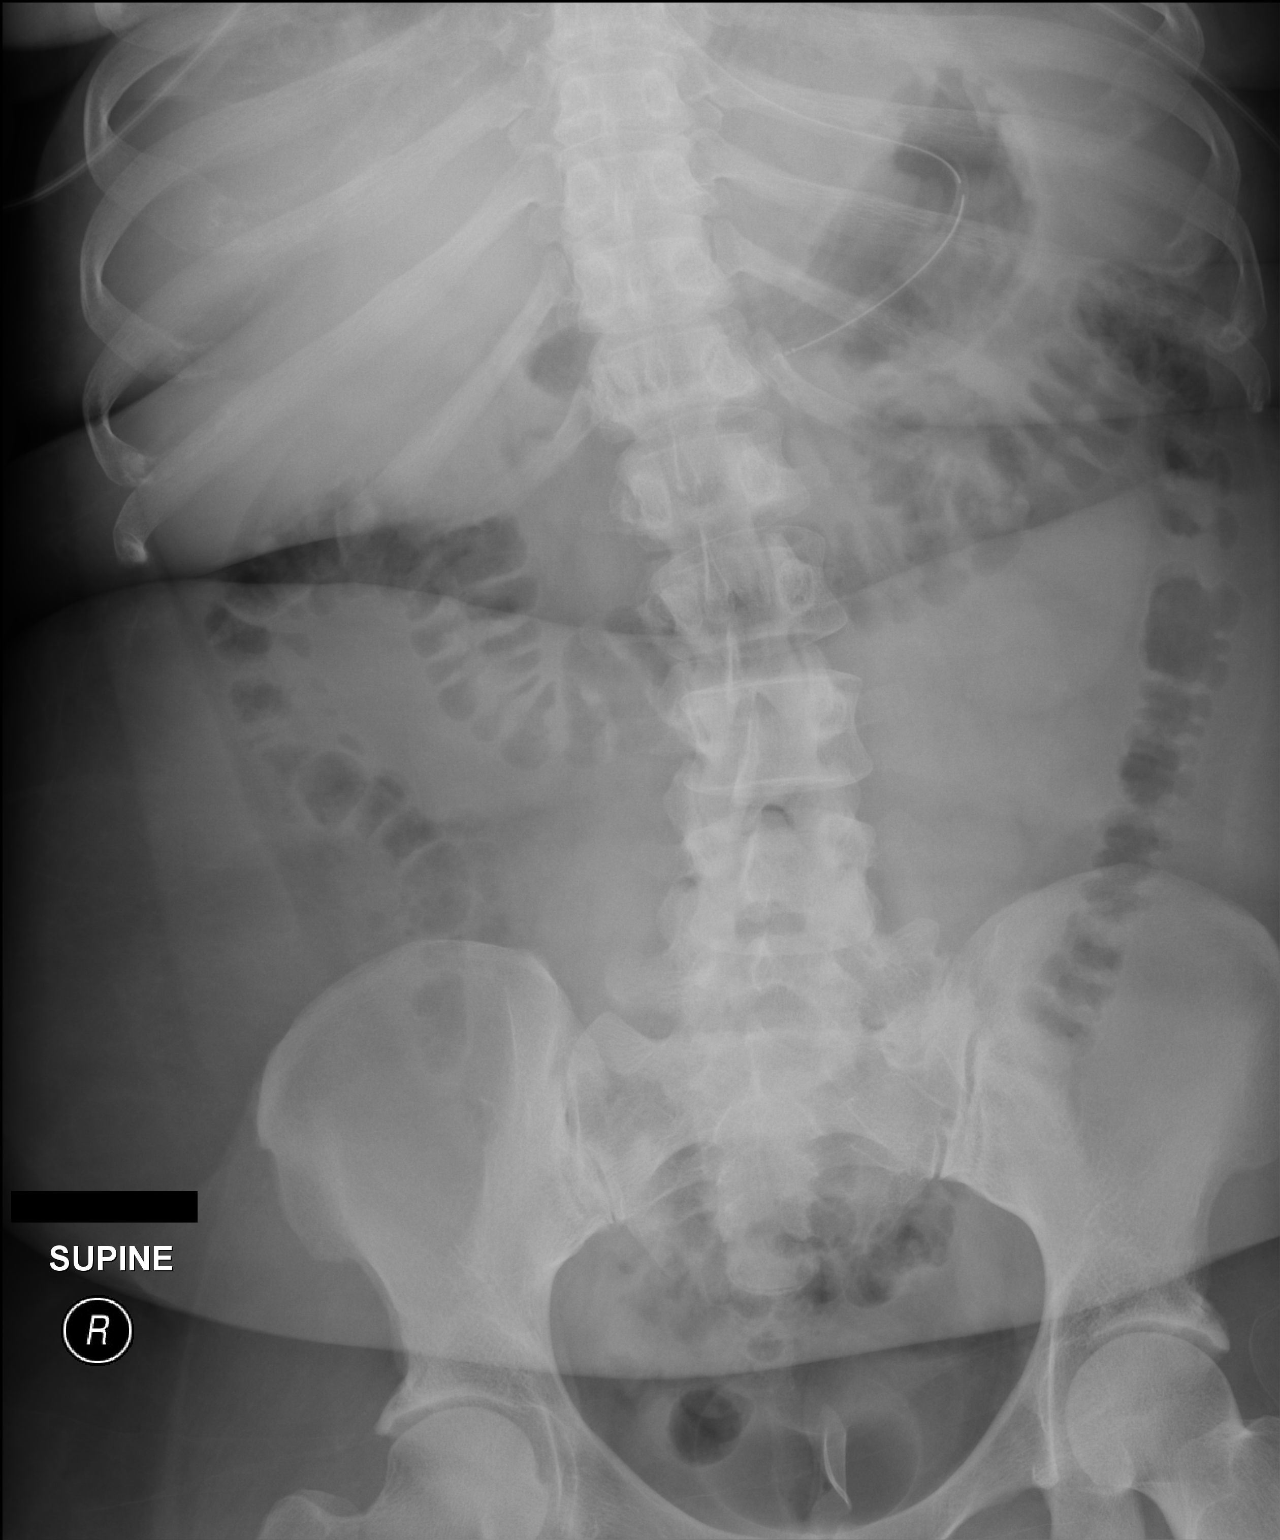

[1 of 1 positions shown; findings below may reference images not displayed]

FINDINGS: Gastric decompression tube visualized with the tip in the distal
body of the stomach. Bowel gas pattern is normal without evidence of
obstruction or ileus. No abnormal calcifications identified. Bony
and soft tissue structures are unremarkable.
IMPRESSION: Gastric decompression tube extends into the body of the stomach.
Unremarkable bowel gas pattern.

## 2017-04-27 NOTE — Telephone Encounter (Signed)
So glad we were able to get in touch with her, thank you Lauren!

## 2017-04-27 NOTE — Telephone Encounter (Signed)
Second attempt to reach patient. Home # is not in service. No answer and no VM set up on mobile number. No answer and no VM set up on Emergency contact, Asencion Gowda either.  Also received call from [facility] Short stay) that time for arrival tomorrow will be 1:45 pm and no liquids after 1000. They are attempting to reach patient without success as well. Kinnie Feil, RN, BSN

## 2017-04-27 NOTE — Telephone Encounter (Signed)
Attempted to reach patient to inform her of procedure time for tomorrow and ensure transpo has been set up. Patient needs to arrive at main entrance to [facility] at 2 pm tomorrow, 5/10, and sign in on clip board at front desk per Leotis Shames at [facility] [phone]). Must be NPO after midnight today. May have clear liquids only up to 1100 tomorrow if needs to. Home number is not in service and no answer and no VM set up on mobile number. Will make another attempt later. Kinnie Feil, RN, BSN

## 2017-04-27 NOTE — Telephone Encounter (Signed)
Patient called in. States home number was incorrect. Number in chart is now correct. Cell phone without minutes currently.  All information for procedure tomorrow given including phone number for short stay. Patient has transportation lined up for procedure tomorrow as well as HFU on 5/14 with Dr. Wende Mott Medications reviewed. Patient has not yet picked up lasix or potassium but is planning on today. Aware that these are at [facility]. Patient states she was not aware that she needed to take labetalol but will begin today. Patient also spoke with in-house LCSW regarding assistance with rent. Kinnie Feil, RN, BSN

## 2017-04-27 NOTE — Telephone Encounter (Signed)
Called [employer] for welfare check - they will be visiting her home within the hour and will let me know. I gave them the clinic number to ask her to call us if she is found.

## 2017-04-27 NOTE — Progress Notes (Signed)
We have made several unsuccessful attempts to reach Tara [name].  There are several notes in [facility] where providers have not been able to reach this patient.  I spoke with Lauren at [facility] to inform her to let the patient know to arrive at 1:45PM, follow fluid restrictions per CHF CLEAR UNTIL 10 AM ONLY

## 2017-04-27 NOTE — Telephone Encounter (Signed)
Sent message to patient's listed e-mail address ([email or web address]) with information below and request to return call. Kinnie Feil, RN, BSN

## 2017-04-27 NOTE — Progress Notes (Signed)
Call from patient requesting community resources to assist with rent due to not being able to work. LCSW reviewed community resources with patient explained that there are very limited community resources to assist with rent.    Plan: Patient will make calls resources provided.  Sammuel Hines, LCSW Licensed Clinical Social Worker [facility]   [phone] 4:39 PM

## 2017-04-27 NOTE — Telephone Encounter (Signed)
She gave me that home phone number yesterday saying that her cell was having issues. I hope she is able to answer later today. Please keep me updated.

## 2017-04-27 NOTE — Telephone Encounter (Signed)
2nd attempt for call. No answer on mobile, home not in service. I am calling [employer] for a wellfare check.

## 2017-04-27 NOTE — Discharge Summary (Signed)
Family Medicine Teaching [facility] Discharge Summary  Patient name: Tara [name] Medical record number: [id number] Date of birth: Nov 14, 1991 Age: 26 y.o. Gender: female Date of Admission: 04/23/2017  Date of Discharge: 04/26/17 Admitting Physician: Doreene Eland, MD  Primary Care Provider: Garth Bigness, MD Consultants: Sheral Apley, heart failure  Indication for Hospitalization: SOB  Discharge Diagnoses/Problem List:  Patient Active Problem List   Diagnosis Date Noted  . Shortness of breath 04/23/2017  . Pregnancy   . Abdominal pregnancy with intrauterine pregnancy 04/12/2017  . Acute on chronic diastolic congestive heart failure (HCC) 03/08/2017  . Cough 02/11/2017  . Birth control counseling 01/31/2017  . Dilated cardiomyopathy (HCC)   . Pulmonary hypertension (HCC)   . Tachycardia with heart rate 100-120 beats per minute   . Noncompliance   . Graves disease 02/11/2016  . Morbid obesity due to excess calories (HCC)    Disposition: home  Discharge Condition: stable  Discharge Exam: see progress note from day of discharge  Brief Hospital Course:  Patient presents with tachycardia, shortness of breath, that were worsening over the 3 days prior to admission. No pain with deep inspiration, decreased suspicion for PE based on d-dimer that was normal for first trimester pregnancy. She endorsed taking her home Lasix dose. Shortness of breath thought to be secondary to CHF exacerbation. Weights were not reliable given concomitant 11 week pregnancy. Troponin EKG normal. Chest x-ray consistent with pulmonary edema. OB was consulted, felt that medical termination of pregnancy was appropriate given severity of underlying disease. Consent was signed for this. Heart failure was consulted, and the patient was diuresed with IV Lasix 80 twice a day for 3 days with good response. She was transitioned to by mouth diuresis on day of discharge with 20 mEq of potassium daily at discharge. Primary  team worked closely with OB to coordinate outpatient D and E scheduled for 04/28/2017. Tachycardia improved with diuresis as well as increase of labetalol from 100 twice a day to 200 twice a day. Patient was discharged with 40 mg of Lasix, 20 mEq of potassium, 200 mg of labetalol twice a day. She was instructed to give our clinic a call after the procedure is complete and we will adjust her medications to optimize her heart failure and Graves' disease once she is no longer pregnant. She endorsed desiring birth control, and was open to Depo, Nexplanon, or Mirena. She also suggested on the day of discharge that she would be going to the [facility] office with her paced up in order to reapply.  Issues for Follow Up:  1. Termination of pregnancy - patient scheduled for D&E 5/10 afternoon. Womens to call patient with timing and NPO instructions.  2. HF - patient needs continued HF follow up. She was discharged on lasix 40mg  daily and Kdur daily. She says she has a hard time taking the Kdur due to the size, please evaluate adherence to this regimen and recheck BMP.  3. Barriers to care: Patient has previously not been able to obtain Medicaid, and this has prevented her from being referred to endocrinology as well as general surgery for treatment of her uncontrolled Graves' disease. Additionally, this causes her to be unable to afford her methimazole as well as labetalol. Patient states that she was going to reapply for Medicaid, please follow-up on whether this was successful. She also states that she has not applied for the orange card, which may be helpful as well. 4. Graves' disease: Prior to pregnancy, patient was on  methimazole 15 mg 3 times a day and labetalol 100 mg twice a day. Patient has not seen an endocrinologist due to lack of insurance. Ultimately, I believe she needs a thyroidectomy by general surgery or ENT, although I'm not sure whether she needs to be evaluated by endocrinology first. If she  obtains insurance, we can place with these referrals. What she is no longer pregnant, it would be appropriate to restart her methimazole.  Significant Procedures: none  Significant Labs and Imaging:   Recent Labs Lab 04/24/17 0347 04/25/17 0342 04/26/17 0353  WBC 6.8 7.6 6.8  HGB 10.2* 10.6* 10.5*  HCT 32.2* 33.6* 32.9*  PLT 224 229 233    Recent Labs Lab 04/22/17 2328 04/23/17 1028 04/24/17 0347 04/25/17 0342 04/26/17 0353  NA 136  --  137 135 135  K 3.6  --  3.2* 3.4* 3.4*  CL 105  --  104 102 102  CO2 22  --  24 24 24   GLUCOSE 94  --  96 105* 92  BUN 10  --  9 10 10   CREATININE 0.51 0.43* 0.52 0.49 0.51  CALCIUM 9.3  --  8.6* 8.8* 8.7*  MG  --  1.5*  --  1.8 1.8  PHOS  --   --   --  4.4 4.4  ALBUMIN  --   --   --  2.9* 2.9*   Dg Chest 2 View  Result Date: 04/22/2017 CLINICAL DATA:  Midsternal chest pain and dyspnea, patient is [redacted] weeks pregnant and was double shielded for imaging. EXAM: CHEST  2 VIEW COMPARISON:  None. FINDINGS: The heart size and mediastinal contours are within normal limits. Mild central vascular congestion is noted with stable cardiomegaly. The visualized skeletal structures are unremarkable. IMPRESSION: Stable cardiomegaly with central vascular congestion consistent with mild pulmonary edema. Electronically Signed   By: Tollie Eth M.D.   On: 04/22/2017 23:59   US Ob Comp Less 14 Wks  Result Date: 04/23/2017 CLINICAL DATA:  26 year old pregnant female with tachycardia and history of CHF. EDC by first sonogram: 11/10/2017, projecting to an expected gestational age of [redacted] weeks 2 days. EXAM: OBSTETRIC <14 WK [place] AND TRANSVAGINAL OB US TECHNIQUE: Both transabdominal and transvaginal ultrasound examinations were performed for complete evaluation of the gestation as well as the maternal uterus, adnexal regions, and pelvic cul-de-sac. Transvaginal technique was performed to assess early pregnancy. COMPARISON:  03/29/2017 obstetric scan. FINDINGS: Intrauterine  gestational sac: Single intrauterine gestational sac is normal and size, shape and position. Yolk sac:  Visualized. Fetus: Visualized. Fetal anatomy not assessed at this early gestational age. Fetal Cardiac Activity: Regular rate and rhythm. Fetal Heart Rate: 144  bpm CRL:  45.4  mm   11 w   3 d                  [place] EDC: 11/09/2017 Subchorionic hemorrhage:  None visualized. Maternal uterus/adnexae: Anteverted uterus. No uterine fibroids demonstrated. Nonvisualization of the ovaries bilaterally. No adnexal masses demonstrated. No abnormal free fluid in the pelvis. IMPRESSION: 1. Single living intrauterine gestation at 11 weeks 3 days by crown-rump length, with appropriate interval fetal growth. 2. No acute first-trimester gestational abnormality. Normal fetal cardiac activity. Electronically Signed   By: Delbert Phenix M.D.   On: 04/23/2017 11:44   US Ob Comp Less 14 Wks  Result Date: 03/29/2017 CLINICAL DATA:  Abdominal cramping.  Vomiting. EXAM: OBSTETRIC <14 WK [place] AND TRANSVAGINAL OB US TECHNIQUE: Both transabdominal and transvaginal ultrasound examinations were  performed for complete evaluation of the gestation as well as the maternal uterus, adnexal regions, and pelvic cul-de-sac. Transvaginal technique was performed to assess early pregnancy. COMPARISON:  None. FINDINGS: Intrauterine gestational sac: Single Yolk sac:  Es Embryo:  Yes Cardiac Activity:  yes Heart Rate: 157  bpm CRL:  13.9  mm   7 w   5 d                  [place] EDC: 11/10/2017 Subchorionic hemorrhage:  Small subchorionic hemorrhage. Maternal uterus/adnexae: Normal appearing ovaries with a corpus luteum cyst on the left ovary. No appreciable free fluid. IMPRESSION: Single intrauterine pregnancy of approximately 7 weeks 5 days gestation. Small subchorionic hemorrhage. Electronically Signed   By: Francene Boyers M.D.   On: 03/29/2017 10:04   US Ob Transvaginal  Result Date: 04/23/2017 CLINICAL DATA:  26 year old pregnant female with tachycardia and  history of CHF. EDC by first sonogram: 11/10/2017, projecting to an expected gestational age of [redacted] weeks 2 days. EXAM: OBSTETRIC <14 WK [place] AND TRANSVAGINAL OB US TECHNIQUE: Both transabdominal and transvaginal ultrasound examinations were performed for complete evaluation of the gestation as well as the maternal uterus, adnexal regions, and pelvic cul-de-sac. Transvaginal technique was performed to assess early pregnancy. COMPARISON:  03/29/2017 obstetric scan. FINDINGS: Intrauterine gestational sac: Single intrauterine gestational sac is normal and size, shape and position. Yolk sac:  Visualized. Fetus: Visualized. Fetal anatomy not assessed at this early gestational age. Fetal Cardiac Activity: Regular rate and rhythm. Fetal Heart Rate: 144  bpm CRL:  45.4  mm   11 w   3 d                  [place] EDC: 11/09/2017 Subchorionic hemorrhage:  None visualized. Maternal uterus/adnexae: Anteverted uterus. No uterine fibroids demonstrated. Nonvisualization of the ovaries bilaterally. No adnexal masses demonstrated. No abnormal free fluid in the pelvis. IMPRESSION: 1. Single living intrauterine gestation at 11 weeks 3 days by crown-rump length, with appropriate interval fetal growth. 2. No acute first-trimester gestational abnormality. Normal fetal cardiac activity. Electronically Signed   By: Delbert Phenix M.D.   On: 04/23/2017 11:44   US Ob Transvaginal  Result Date: 03/29/2017 CLINICAL DATA:  Abdominal cramping.  Vomiting. EXAM: OBSTETRIC <14 WK [place] AND TRANSVAGINAL OB US TECHNIQUE: Both transabdominal and transvaginal ultrasound examinations were performed for complete evaluation of the gestation as well as the maternal uterus, adnexal regions, and pelvic cul-de-sac. Transvaginal technique was performed to assess early pregnancy. COMPARISON:  None. FINDINGS: Intrauterine gestational sac: Single Yolk sac:  Es Embryo:  Yes Cardiac Activity:  yes Heart Rate: 157  bpm CRL:  13.9  mm   7 w   5 d                  [place] EDC:  11/10/2017 Subchorionic hemorrhage:  Small subchorionic hemorrhage. Maternal uterus/adnexae: Normal appearing ovaries with a corpus luteum cyst on the left ovary. No appreciable free fluid. IMPRESSION: Single intrauterine pregnancy of approximately 7 weeks 5 days gestation. Small subchorionic hemorrhage. Electronically Signed   By: Francene Boyers M.D.   On: 03/29/2017 10:04   Echo with EF 50-55%, normal wall motion, PA peak pressure .   Results/Tests Pending at Time of Discharge: none  Discharge Medications:  Allergies as of 04/26/2017      Reactions   Iodine Hives   Shellfish Allergy Other (See Comments)   Reaction not noted   Tape Rash      Medication  List    STOP taking these medications   benzonatate 100 MG capsule Commonly known as:  TESSALON   carvedilol 12.5 MG tablet Commonly known as:  COREG   methimazole 5 MG tablet Commonly known as:  TAPAZOLE   norgestimate-ethinyl estradiol 0.25-35 MG-MCG tablet Commonly known as:  ORTHO-CYCLEN,SPRINTEC,PREVIFEM     TAKE these medications   furosemide 40 MG tablet Commonly known as:  LASIX Take 1 tablet (40 mg total) by mouth daily. What changed:  how much to take   labetalol 200 MG tablet Commonly known as:  NORMODYNE Take 1 tablet (200 mg total) by mouth 2 (two) times daily.   potassium chloride SA 20 MEQ tablet Commonly known as:  K-DUR,KLOR-CON Take 1 tablet (20 mEq total) by mouth daily.       Discharge Instructions: Please refer to Patient Instructions section of EMR for full details.  Patient was counseled important signs and symptoms that should prompt return to medical care, changes in medications, dietary instructions, activity restrictions, and follow up appointments.   Follow-Up Appointments: Follow-up Information    [facility] Follow up on 05/05/2017.   Specialty:  Cardiology Why:  at 1400 for post hospital follow up.  Please call if you need to reschedule  or would prefer a female provider.   Code for parking is [phone]. Contact information: [address] [id number] mc [place] [address] [phone]       Leland Her, DO. Go on 05/02/2017.   Why:  3pm hospital follow up with Dr. Artist Pais, please arrive by 2:45pm. This is important for medications and we may be able to do your Nexplanon.  Contact information: [address] [place] [address] [phone]           Garth Bigness, MD 04/27/2017, 8:47 PM PGY-1, [facility]

## 2017-04-28 ENCOUNTER — Encounter (HOSPITAL_COMMUNITY): Payer: Self-pay | Admitting: Anesthesiology

## 2017-04-28 ENCOUNTER — Telehealth: Payer: Self-pay | Admitting: Licensed Clinical Social Worker

## 2017-04-28 ENCOUNTER — Telehealth: Payer: Self-pay | Admitting: Family Medicine

## 2017-04-28 ENCOUNTER — Encounter (HOSPITAL_COMMUNITY): Admission: RE | Payer: Self-pay | Source: Ambulatory Visit

## 2017-04-28 ENCOUNTER — Ambulatory Visit (HOSPITAL_COMMUNITY)
Admission: RE | Admit: 2017-04-28 | Payer: Medicaid Other | Source: Ambulatory Visit | Admitting: Obstetrics & Gynecology

## 2017-04-28 IMAGING — CR DG CHEST 1V PORT
1 series · 1 of 1 positions shown · non-contrast
Comparison: 10/30/2016

CLINICAL DATA: Acute hypoxemic respiratory failure

EXAM:
PORTABLE CHEST 1 VIEW

[AP]
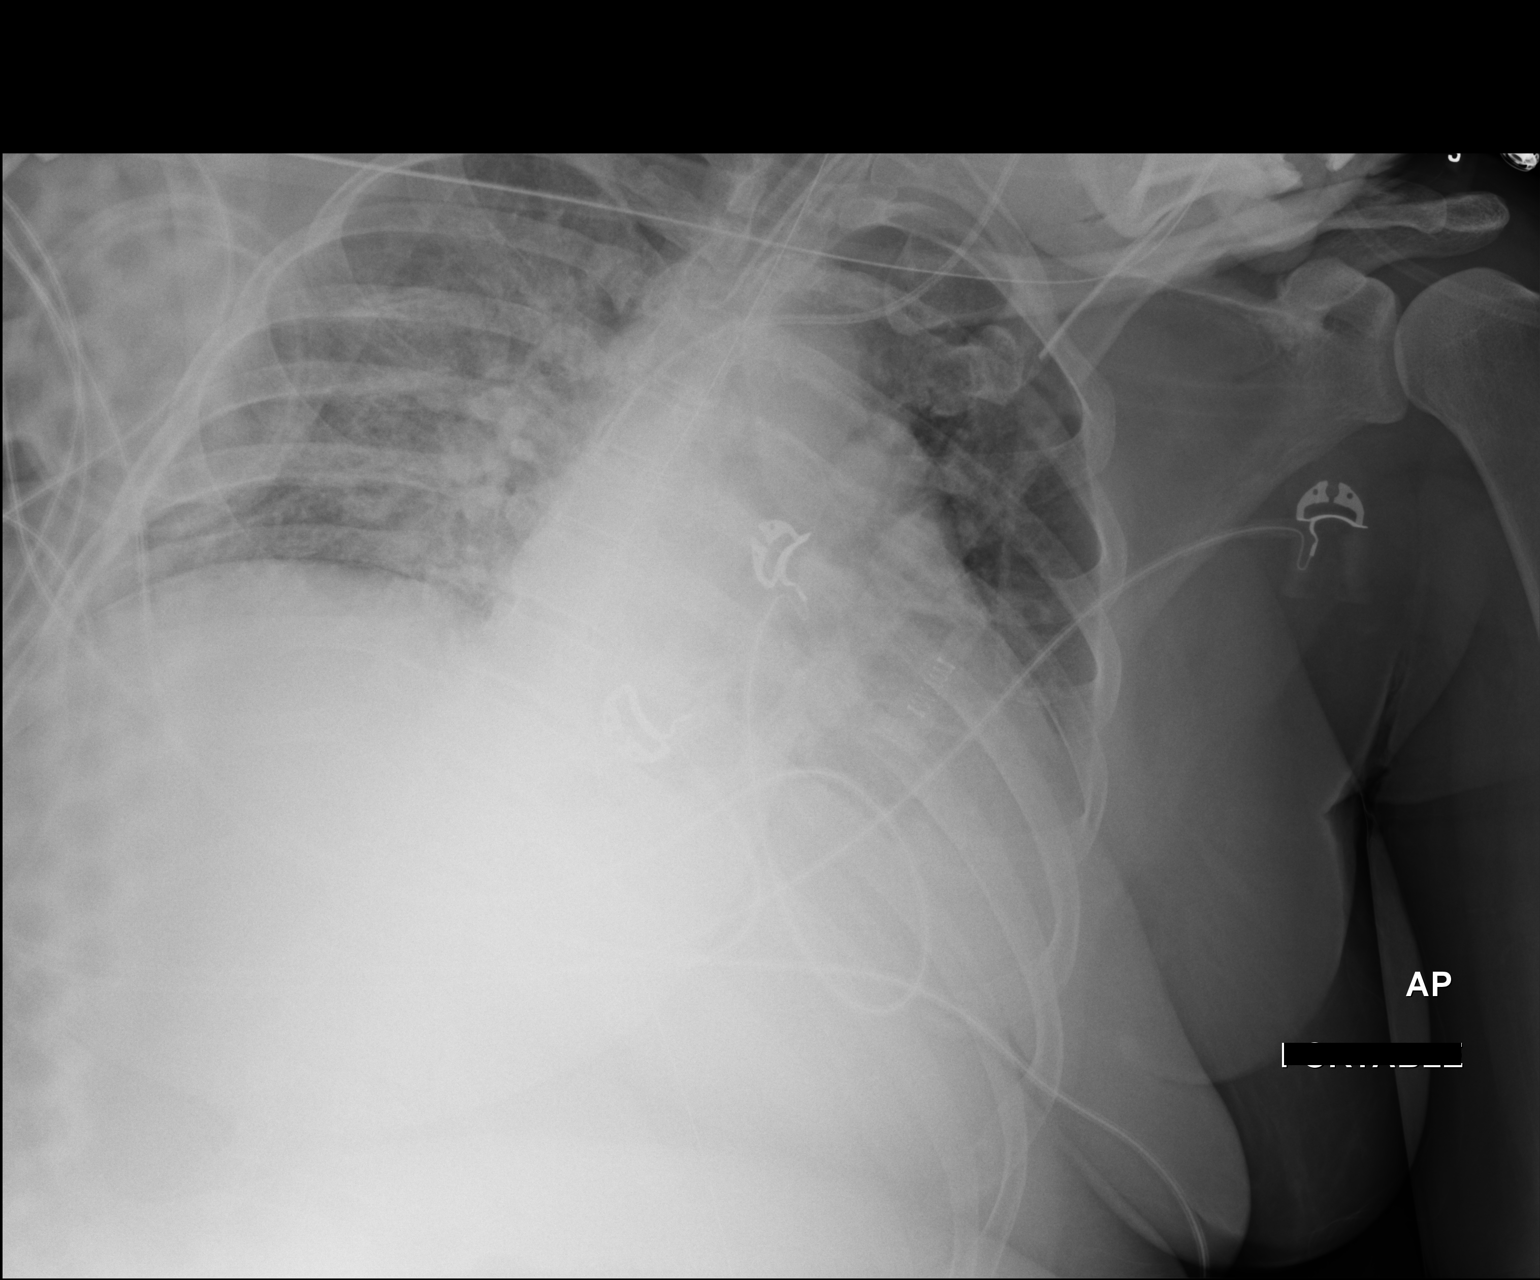

[1 of 1 positions shown; findings below may reference images not displayed]

FINDINGS: Endotracheal tube tip is 2 cm above the carina. Nasogastric tube
enters the stomach left internal jugular central line has its tip in
the SVC just above the right atrium. Bilateral airspace filling has
worsened, most likely worsen pneumonia. Some of this may relate to
ventilatory timing. I think there is worsened volume loss in the
left lower lobe
IMPRESSION: Lines and tubes well positioned. Worsened pneumonia and left lower
lobe volume loss.

## 2017-04-28 SURGERY — DILATION AND EVACUATION, UTERUS
Anesthesia: General

## 2017-04-28 NOTE — OR Nursing (Signed)
In contact c patient at home.  Was planning on getting in cab to come have procedure done and get in cab/uber to go home.  Explained to patient we couldn't send her home in a cab/bus and needed to have someone c her at home.  She was upset because was instructed by "Dr. Chanetta Marshall to use the bus/uber  Passes" to get home.  Apologized and explained to her she needed to call Dr. Olivia Mackie office in AM to reschedule case ASAP.  Pt verbalized understanding.

## 2017-04-28 NOTE — Telephone Encounter (Signed)
Patient contacted CSW to inquire about resources for rental assistance. Patient reports she has been hospitalized and unable to work therefore is now behind in her rent. CSW provided multiple housing resources to assist with funds to help pay back rent. Patient will follow up and return call to CSW if further assistance needed. Patient grateful for assistance. [name], LCSW, CCSW-MCS [phone]

## 2017-04-28 NOTE — Telephone Encounter (Signed)
Received a call from patient that she was told by short stay that she cannot get her procedure done today due to her inability to have someone drive her home. I called short stay, and this is their policy. She was given bus passes at discharge as she has no car, her partner does not drive, and she uses either the bus or [employer] for her transportation. I understand the policy and explained this to the patient. I asked her if her partner can ride with her in the Uber/bus and supervise her on the way home. She states this is possible if she can find someone to watch her kids, which she cannot today. I am calling short stay back to ask them to please help her reschedule for tomorrow, as they offered. She states that she will work on developing a plan for childcare for tomorrow so that her partner can accompany her and he can supervise her on the ride home. Discussed this with Chastity: she and Carlye Grippe made a plan for Carlye Grippe to call her tomorrow morning by 8:30am with her ride and the procedure will be rescheduled. I sincerely appreciate the care of the [facility], anesthesia team, and Dr. Debroah Loop in trying to work with this patient despite many social barriers.

## 2017-04-29 IMAGING — CR DG CHEST 1V PORT
1 series · 1 of 1 positions shown · non-contrast
Comparison: Portable chest x-ray October 31, 2016

CLINICAL DATA: Acute hypoxemia, respiratory failure, sepsis and
community-acquired pneumonia.

EXAM:
PORTABLE CHEST 1 VIEW

[AP]
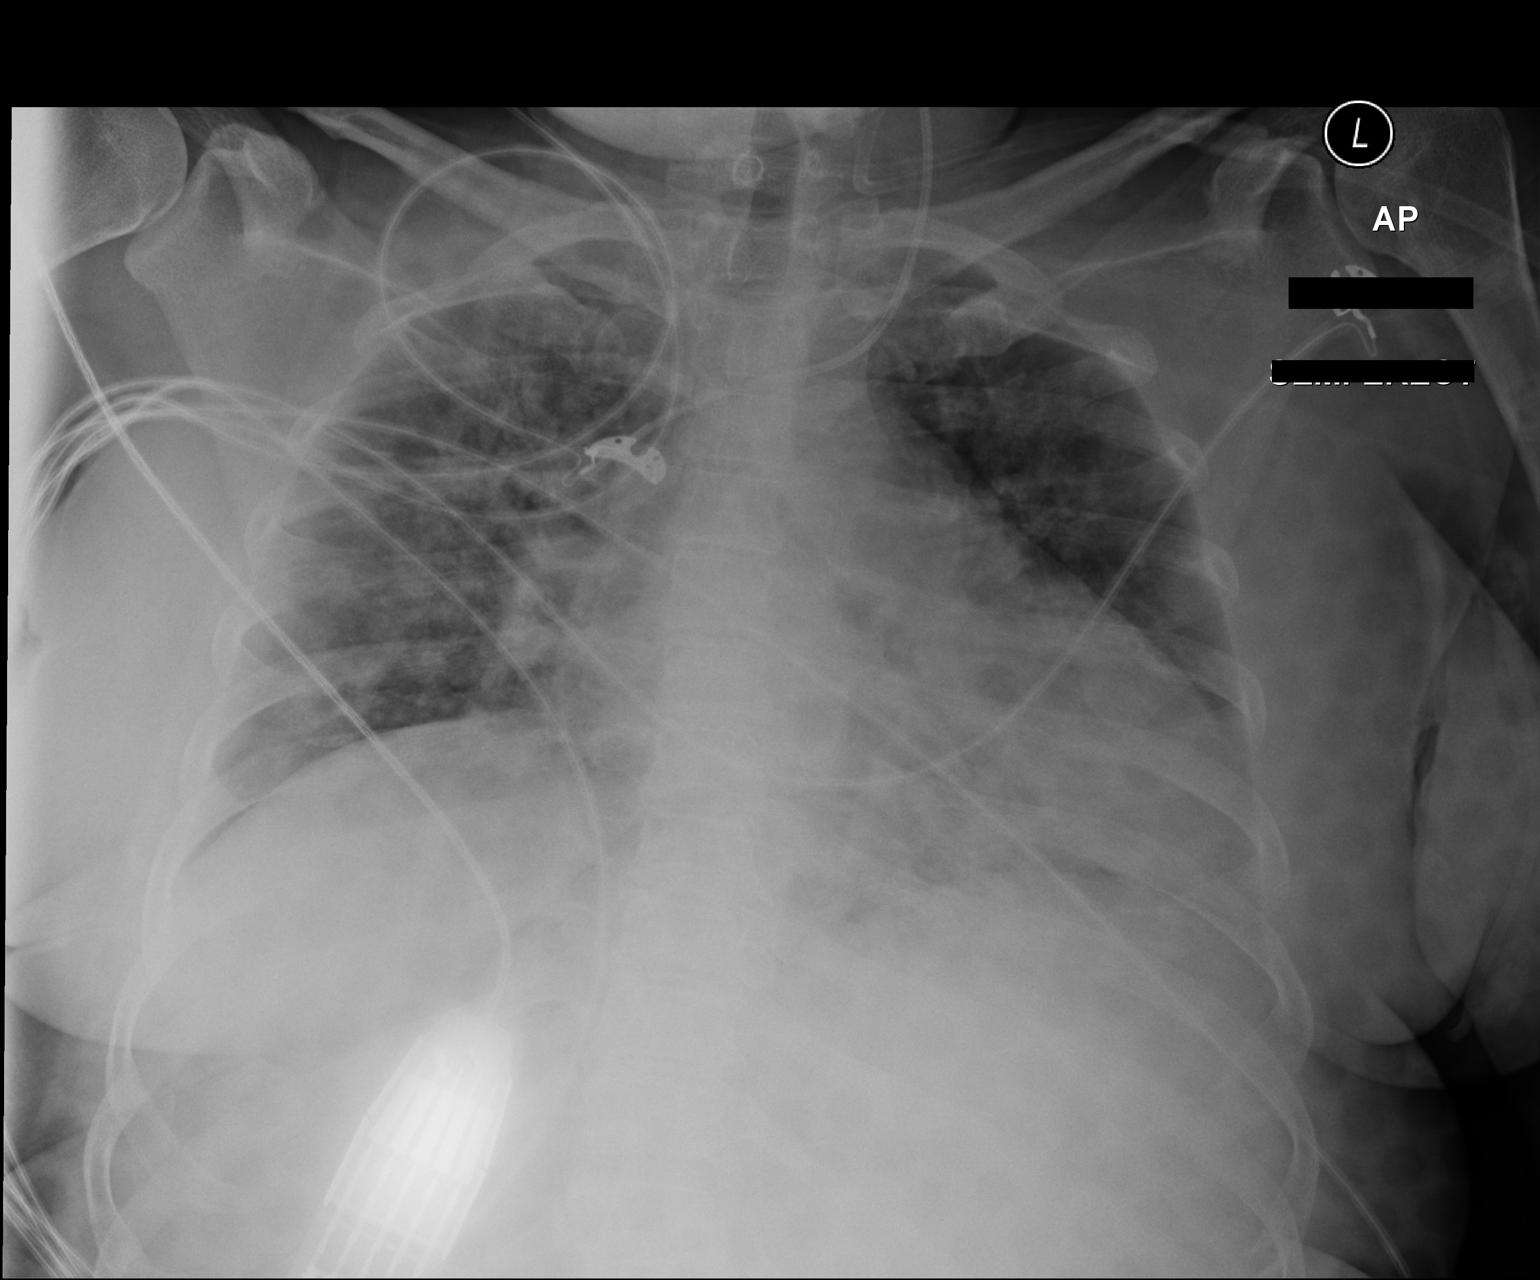

[1 of 1 positions shown; findings below may reference images not displayed]

FINDINGS: The trachea and esophagus have been extubated. The lungs remain
hypoinflated. The interstitial markings are coarse especially on the
right and in the retrocardiac region on the left. The cardiac
silhouette is enlarged. The pulmonary vascularity is prominent
centrally but less congested overall than on the previous study. The
mediastinum is normal in width. The left internal jugular venous
catheter tip projects over the proximal SVC.
IMPRESSION: Improved appearance of the pulmonary interstitium suggesting
decreasing interstitial edema or pneumonia. Persistent hypo
inflation following extubation. Stable cardiomegaly with central
pulmonary vascularity.

## 2017-05-02 ENCOUNTER — Inpatient Hospital Stay: Payer: Medicaid Other | Admitting: Family Medicine

## 2017-05-02 ENCOUNTER — Telehealth: Payer: Self-pay | Admitting: *Deleted

## 2017-05-02 NOTE — Telephone Encounter (Signed)
Attempted to reach patient to see if procedure has been done and to remind her of today's appt at 1:30 PM with Dr. Wende Mott. There is no answer and no VM set-up on either home or mobile phone. Kinnie Feil, RN, BSN

## 2017-05-05 ENCOUNTER — Inpatient Hospital Stay (HOSPITAL_COMMUNITY): Admit: 2017-05-05 | Payer: Medicaid Other

## 2017-05-05 ENCOUNTER — Inpatient Hospital Stay (HOSPITAL_COMMUNITY): Admission: RE | Admit: 2017-05-05 | Payer: Medicaid Other | Source: Ambulatory Visit

## 2017-05-06 ENCOUNTER — Encounter (HOSPITAL_COMMUNITY): Payer: Self-pay | Admitting: Anesthesiology

## 2017-05-06 ENCOUNTER — Encounter (HOSPITAL_COMMUNITY): Payer: Self-pay | Admitting: *Deleted

## 2017-05-06 ENCOUNTER — Ambulatory Visit (HOSPITAL_COMMUNITY)
Admission: AD | Admit: 2017-05-06 | Payer: Medicaid Other | Source: Ambulatory Visit | Admitting: Obstetrics & Gynecology

## 2017-05-06 ENCOUNTER — Encounter: Payer: Self-pay | Admitting: Obstetrics & Gynecology

## 2017-05-06 ENCOUNTER — Encounter (HOSPITAL_COMMUNITY): Admission: AD | Payer: Self-pay | Source: Ambulatory Visit

## 2017-05-06 SURGERY — DILATION AND EVACUATION, UTERUS
Anesthesia: General

## 2017-05-06 MED ORDER — SCOPOLAMINE 1 MG/3DAYS TD PT72
MEDICATED_PATCH | TRANSDERMAL | Status: AC
  Filled 2017-05-06: qty 1

## 2017-05-06 MED ORDER — DOXYCYCLINE HYCLATE 100 MG IV SOLR
200.0000 mg | INTRAVENOUS | Status: DC
  Filled 2017-05-06: qty 200

## 2017-05-09 ENCOUNTER — Ambulatory Visit (HOSPITAL_COMMUNITY)
Admission: RE | Admit: 2017-05-09 | Payer: Medicaid Other | Source: Ambulatory Visit | Admitting: Obstetrics & Gynecology

## 2017-05-09 HISTORY — DX: Other seasonal allergic rhinitis: J30.2

## 2017-05-09 HISTORY — DX: Dyspnea, unspecified: R06.00

## 2017-05-09 HISTORY — DX: Heart failure, unspecified: I50.9

## 2017-05-09 HISTORY — DX: Type 2 diabetes mellitus without complications: E11.9

## 2017-05-09 HISTORY — DX: Anxiety disorder, unspecified: F41.9

## 2017-05-09 SURGERY — DILATION AND EVACUATION, UTERUS
Anesthesia: General

## 2017-05-17 ENCOUNTER — Telehealth: Payer: Self-pay | Admitting: Family Medicine

## 2017-05-17 NOTE — Telephone Encounter (Signed)
Pt had an appt to have D&E done last week and womens and missed it.  She was told Family Practice had to re schedule the appt.  Please advise

## 2017-05-19 ENCOUNTER — Telehealth: Payer: Self-pay | Admitting: Licensed Clinical Social Worker

## 2017-05-19 NOTE — Telephone Encounter (Signed)
CSW attempted multiple times to reach patient to assist due to multiple no shows and cancellations. CSW unable to leave message. [name], LCSW, CCSW-MCS [phone]

## 2017-05-20 NOTE — Telephone Encounter (Signed)
Attempted to reach patient on both home and mobile numbers to give her info from PCP below. No answer and no VM set-up. Will make second attempt later.  Kinnie Feil, RN, BSN

## 2017-05-20 NOTE — Telephone Encounter (Signed)
She needs to call the [facility] at [facility]. The clinic number is [phone]. Dr. Macon Large was who scheduled the procedure last time. I am not able to schedule D&Es.

## 2017-05-23 NOTE — Telephone Encounter (Signed)
Second attempt to relay info from PCP. No answer on either number and no VM set up. Kinnie Feil, RN, BSN

## 2017-05-25 NOTE — Telephone Encounter (Signed)
Third attempt to relay info from PCP. No answer on either number and no VM set up. Kinnie Feil, RN, BSN

## 2017-06-04 ENCOUNTER — Other Ambulatory Visit: Payer: Self-pay

## 2017-06-04 ENCOUNTER — Encounter (HOSPITAL_COMMUNITY): Payer: Self-pay | Admitting: Emergency Medicine

## 2017-06-04 ENCOUNTER — Inpatient Hospital Stay (HOSPITAL_COMMUNITY)
Admission: EM | Admit: 2017-06-04 | Discharge: 2017-06-04 | DRG: 781 | Disposition: A | Discharge status: Home / Self Care | Payer: Medicaid Other | Attending: Family Medicine | Admitting: Family Medicine

## 2017-06-04 ENCOUNTER — Emergency Department (HOSPITAL_COMMUNITY): Payer: Medicaid Other

## 2017-06-04 DIAGNOSIS — Z79899 Other long term (current) drug therapy: Secondary | ICD-10-CM

## 2017-06-04 DIAGNOSIS — O99342 Other mental disorders complicating pregnancy, second trimester: Secondary | ICD-10-CM | POA: Diagnosis present

## 2017-06-04 DIAGNOSIS — I509 Heart failure, unspecified: Secondary | ICD-10-CM

## 2017-06-04 DIAGNOSIS — I5023 Acute on chronic systolic (congestive) heart failure: Secondary | ICD-10-CM | POA: Diagnosis present

## 2017-06-04 DIAGNOSIS — R0602 Shortness of breath: Secondary | ICD-10-CM | POA: Diagnosis not present

## 2017-06-04 DIAGNOSIS — O99212 Obesity complicating pregnancy, second trimester: Secondary | ICD-10-CM | POA: Diagnosis present

## 2017-06-04 DIAGNOSIS — Z6841 Body Mass Index (BMI) 40.0 and over, adult: Secondary | ICD-10-CM | POA: Diagnosis not present

## 2017-06-04 DIAGNOSIS — I110 Hypertensive heart disease with heart failure: Secondary | ICD-10-CM | POA: Diagnosis present

## 2017-06-04 DIAGNOSIS — I420 Dilated cardiomyopathy: Secondary | ICD-10-CM | POA: Diagnosis present

## 2017-06-04 DIAGNOSIS — O24112 Pre-existing diabetes mellitus, type 2, in pregnancy, second trimester: Secondary | ICD-10-CM | POA: Diagnosis present

## 2017-06-04 DIAGNOSIS — E0500 Thyrotoxicosis with diffuse goiter without thyrotoxic crisis or storm: Secondary | ICD-10-CM | POA: Diagnosis present

## 2017-06-04 DIAGNOSIS — I272 Pulmonary hypertension, unspecified: Secondary | ICD-10-CM | POA: Diagnosis present

## 2017-06-04 DIAGNOSIS — I252 Old myocardial infarction: Secondary | ICD-10-CM | POA: Diagnosis not present

## 2017-06-04 DIAGNOSIS — E119 Type 2 diabetes mellitus without complications: Secondary | ICD-10-CM | POA: Diagnosis present

## 2017-06-04 DIAGNOSIS — Z3A17 17 weeks gestation of pregnancy: Secondary | ICD-10-CM

## 2017-06-04 DIAGNOSIS — O10112 Pre-existing hypertensive heart disease complicating pregnancy, second trimester: Principal | ICD-10-CM | POA: Diagnosis present

## 2017-06-04 DIAGNOSIS — F419 Anxiety disorder, unspecified: Secondary | ICD-10-CM | POA: Diagnosis present

## 2017-06-04 DIAGNOSIS — Z3492 Encounter for supervision of normal pregnancy, unspecified, second trimester: Secondary | ICD-10-CM

## 2017-06-04 DIAGNOSIS — E0590 Thyrotoxicosis, unspecified without thyrotoxic crisis or storm: Secondary | ICD-10-CM

## 2017-06-04 LAB — BASIC METABOLIC PANEL
ANION GAP: 9 (ref 5–15)
BUN: 7 mg/dL (ref 6–20)
CO2: 23 mmol/L (ref 22–32)
Calcium: 8.9 mg/dL (ref 8.9–10.3)
Chloride: 105 mmol/L (ref 101–111)
Creatinine, Ser: 0.31 mg/dL — ABNORMAL LOW (ref 0.44–1.00)
Glucose, Bld: 93 mg/dL (ref 65–99)
Potassium: 3.5 mmol/L (ref 3.5–5.1)
Sodium: 137 mmol/L (ref 135–145)

## 2017-06-04 LAB — CBC
HEMATOCRIT: 32.1 % — AB (ref 36.0–46.0)
Hemoglobin: 10.5 g/dL — ABNORMAL LOW (ref 12.0–15.0)
MCH: 22.4 pg — ABNORMAL LOW (ref 26.0–34.0)
MCHC: 32.7 g/dL (ref 30.0–36.0)
MCV: 68.4 fL — ABNORMAL LOW (ref 78.0–100.0)
Platelets: 185 10*3/uL (ref 150–400)
RBC: 4.69 MIL/uL (ref 3.87–5.11)
RDW: 13.8 % (ref 11.5–15.5)
WBC: 11.8 10*3/uL — ABNORMAL HIGH (ref 4.0–10.5)

## 2017-06-04 LAB — I-STAT TROPONIN, ED: TROPONIN I, POC: 0.02 ng/mL (ref 0.00–0.08)

## 2017-06-04 LAB — TSH

## 2017-06-04 LAB — T4: T4, Total: <0.4 ug/dL — ABNORMAL LOW (ref 4.5–12.0)

## 2017-06-04 MED ORDER — NITROGLYCERIN 0.4 MG SL SUBL: 0.4000 mg | SUBLINGUAL_TABLET | SUBLINGUAL | Status: DC | PRN

## 2017-06-04 MED ORDER — FUROSEMIDE 10 MG/ML IJ SOLN
40.0000 mg | Freq: Once | INTRAMUSCULAR | Status: AC
  Administered 2017-06-04: 40 mg via INTRAVENOUS
  Filled 2017-06-04: qty 4

## 2017-06-04 NOTE — ED Triage Notes (Signed)
Patient arrives by EMS in acute respiratory distress with hx CHF. Patient developed CHF from pre-ecclampsia. Infant is 72 months old. Onset 2 hours ago. Nausea and vomiting. Patient unable to walk to bathroom due to shortness of breath.

## 2017-06-04 NOTE — ED Notes (Signed)
Bed: RESB Expected date:  Expected time:  Means of arrival:  Comments: EMS CHF/acute respiratory distress

## 2017-06-04 NOTE — ED Provider Notes (Signed)
[facility]-EMERGENCY DEPT Provider Note   CSN: [id number] Arrival date & time: 06/04/17  9562  By signing my name below, I, Ny'Kea Lewis, attest that this documentation has been prepared under the direction and in the presence of Melene Plan, DO. Electronically Signed: Karren Cobble, ED Scribe. 06/04/17. 3:18 AM.  History   Chief Complaint Chief Complaint  Patient presents with  . Respiratory Distress   The history is provided by the patient. No language interpreter was used.    HPI HPI Comments: Tara [name] is a 26 y.o. female with a history of CHF, MI, HTN, DM, and graves disease, brought in by ambulance, who presents to the Emergency Department complaining of sudden onset, gradually worsening shortness of breath that began tonight. She has some associated coughing. Pt reports chronic respiratory distress but notes tonight she had increased shortness of breath. Notes increased shortness of breath with mild exertion. She reports normally she is able to resolve her shortness of breath with deep breathing but she was not able to do this time. At this time is not currently on her thyroid medication due to financial constraints.  Pt reports she is currently [redacted] weeks pregnant and was scheduled for a D&C on June 1st, for termination of pregnancy due to it  being high risk. She has not had the procedure at this time.  No additional treatment tried. Denies fever, vaginal bleeding or discharge, or any other acute symptoms noted at this time.   Past Medical History:  Diagnosis Date  . Anemia   . Anxiety   . CAP (community acquired pneumonia) 02/13/2016  . CHF (congestive heart failure) (HCC)   . Chronic bronchitis (HCC)   . Diabetes mellitus without complication (HCC)   . Dyspnea    with exertion  . Graves disease   . Graves disease   . Headache   . Hypertension   . Myocardial infarction (HCC) 2013  . Obesity   . Pneumonia "several times"  . Seasonal allergies     Patient Active Problem  List   Diagnosis Date Noted  . CHF exacerbation (HCC) 06/04/2017  . Shortness of breath 04/23/2017  . Pregnancy   . Acute on chronic diastolic congestive heart failure (HCC) 03/08/2017  . Cough 02/11/2017  . Birth control counseling 01/31/2017  . Dilated cardiomyopathy (HCC)   . Pulmonary hypertension (HCC)   . Tachycardia with heart rate 100-120 beats per minute   . Noncompliance   . Graves disease 02/11/2016  . Morbid obesity due to excess calories [facility])     Past Surgical History:  Procedure Laterality Date  . CESAREAN SECTION  2008, 2013   x 2  . CESAREAN SECTION N/A 07/21/2016   Procedure: CESAREAN SECTION;  Surgeon: Lesly Dukes, MD;  Location: [facility] BIRTHING SUITES;  Service: Obstetrics;  Laterality: N/A;  . EYE SURGERY Bilateral 2014   "for Graves Disease; eyelid retraction on left; decompression on the right""    OB History    Gravida Para Term Preterm AB Living   6 5 4 1  0 5   SAB TAB Ectopic Multiple Live Births   0 0 0 0 4     Home Medications    Prior to Admission medications   Medication Sig Start Date End Date Taking? Authorizing Provider  Carboxymethylcellul-Glycerin (LUBRICATING EYE DROPS OP) Place 1 drop into the right eye 3 (three) times daily as needed (dry eyes).   Yes [provider]  furosemide (LASIX) 40 MG tablet Take 1 tablet (40  mg total) by mouth daily. Patient taking differently: Take 80 mg by mouth daily.  04/26/17  Yes Garth Bigness, MD  potassium chloride SA (K-DUR,KLOR-CON) 20 MEQ tablet Take 1 tablet (20 mEq total) by mouth daily. 04/27/17  Yes Garth Bigness, MD  labetalol (NORMODYNE) 200 MG tablet Take 1 tablet (200 mg total) by mouth 2 (two) times daily. Patient not taking: Reported on 06/04/2017 04/26/17   Garth Bigness, MD   Family History Family History  Problem Relation Age of Onset  . Hypertension Mother    Social History Social History  Substance Use Topics  . Smoking status: Never Smoker  . Smokeless  tobacco: Never Used  . Alcohol use No   Allergies   Iodine; Shellfish allergy; and Tape  Review of Systems Review of Systems  Constitutional: Negative for chills and fever.  HENT: Negative for congestion and rhinorrhea.   Eyes: Negative for redness and visual disturbance.  Respiratory: Positive for cough and shortness of breath. Negative for wheezing.   Cardiovascular: Negative for chest pain and palpitations.  Gastrointestinal: Negative for nausea and vomiting.  Genitourinary: Negative for dysuria, urgency, vaginal bleeding and vaginal discharge.  Musculoskeletal: Negative for arthralgias and myalgias.  Skin: Negative for pallor and wound.  Neurological: Negative for dizziness and headaches.   Physical Exam Updated Vital Signs BP (!) 137/109   Pulse (!) 111   Temp 99.2 F (37.3 C) (Oral)   Resp (!) 30   LMP 01/31/2017 (Exact Date)   SpO2 97%   Physical Exam  Constitutional: She is oriented to person, place, and time. She appears well-developed and well-nourished. No distress.  HENT:  Head: Normocephalic and atraumatic.  Eyes: EOM are normal. Pupils are equal, round, and reactive to light.  Neck: Normal range of motion. Neck supple.  Cardiovascular: Regular rhythm.  Tachycardia present.  Exam reveals no gallop and no friction rub.   No murmur heard. Pulmonary/Chest: Effort normal. Tachypnea noted. She has no wheezes. She has rales.  Rales bilaterally to the lower lobes.   Abdominal: Soft. She exhibits no distension. There is no tenderness.  Gravid.   Musculoskeletal: She exhibits edema. She exhibits no tenderness.  Trace edema bilaterally.   Neurological: She is alert and oriented to person, place, and time.  Skin: Skin is warm and dry. She is not diaphoretic.  Psychiatric: She has a normal mood and affect. Her behavior is normal.  Nursing note and vitals reviewed.  ED Treatments / Results  DIAGNOSTIC STUDIES: Oxygen Saturation is 97% on RA, low by my  interpretation.   COORDINATION OF CARE: 2:41 AM-Discussed next steps with pt. Pt verbalized understanding and is agreeable with the plan.   Labs (all labs ordered are listed, but only abnormal results are displayed) Labs Reviewed  BASIC METABOLIC PANEL - Abnormal; Notable for the following:       Result Value   Creatinine, Ser 0.31 (*)    All other components within normal limits  CBC - Abnormal; Notable for the following:    WBC 11.8 (*)    Hemoglobin 10.5 (*)    HCT 32.1 (*)    MCV 68.4 (*)    MCH 22.4 (*)    All other components within normal limits  TSH - Abnormal; Notable for the following:    TSH <0.010 (*)    All other components within normal limits  T4  I-STAT TROPOININ, ED  I-STAT CHEM 8, ED    EKG  EKG Interpretation None  Radiology Dg Chest 2 View  Result Date: 06/04/2017 CLINICAL DATA:  Acute respiratory distress EXAM: CHEST  2 VIEW COMPARISON:  04/22/2017 FINDINGS: Low lung volumes. Cardiomegaly with bilateral upper lobe and left lower lobe pulmonary infiltrates. No pneumothorax. IMPRESSION: 1. Low lung volumes. 2. Right greater than left upper lobe and patchy left lower lobe pulmonary infiltrates 3. Cardiomegaly.  Possible tiny left pleural effusion. Electronically Signed   By: Jasmine Pang M.D.   On: 06/04/2017 02:41    Procedures Procedures (including critical care time)  Medications Ordered in ED Medications  nitroGLYCERIN (NITROSTAT) SL tablet 0.4 mg (not administered)  furosemide (LASIX) injection 40 mg (40 mg Intravenous Given 06/04/17 0320)     Initial Impression / Assessment and Plan / ED Course  I have reviewed the triage vital signs and the nursing notes.  Pertinent labs & imaging results that were available during my care of the patient were reviewed by me and considered in my medical decision making (see chart for details).     26 yo F with a cc of sob.  Going on for months, but worsening over the past couple of hours.  Only  able to make it a few steps.  Has had a few coughing spells, denies fever.  Likely fluid overloaded on my exam. Some improvement with IV Lasix. Was unable to tolerate BiPAP but tachypnea improved. We'll discuss with family practice.  Discussed with Dr. Jolayne Panther from OB.  Willing to do procedure when able.  Discussed with fam practice accept in transfer to [facility].   CRITICAL CARE Performed by: Rae Roam   Total critical care time: 35 minutes  Critical care time was exclusive of separately billable procedures and treating other patients.  Critical care was necessary to treat or prevent imminent or life-threatening deterioration.  Critical care was time spent personally by me on the following activities: development of treatment plan with patient and/or surrogate as well as nursing, discussions with consultants, evaluation of patient's response to treatment, examination of patient, obtaining history from patient or surrogate, ordering and performing treatments and interventions, ordering and review of laboratory studies, ordering and review of radiographic studies, pulse oximetry and re-evaluation of patient's condition.   Final Clinical Impressions(s) / ED Diagnoses   Final diagnoses:  Acute on chronic systolic congestive heart failure (HCC)  Hyperthyroidism  Second trimester pregnancy    New Prescriptions New Prescriptions   No medications on file  I personally performed the services described in this documentation, which was scribed in my presence. The recorded information has been reviewed and is accurate.      Melene Plan, DO 06/04/17 [id number]

## 2017-06-04 NOTE — ED Notes (Signed)
  Pt left hospital AMA. Pt advised that medical staff including physician did not feel that patient was physically stable enough to leave hospital, advised pt to remain in hospital for further treatment and monitoring. Pt advised that heart failure and hyperthyroidism placed patient at serious physical risk for health deterioration up to and including death of patient. Pt reports she is aware, verbalizes understanding of severity of condition. Pt states she must leave to go to work. Pt appears to be mentally competent, appears fully cognizant of risks involved in leaving, needs to work to prevent homelessness. MD made aware. Pt to visit outpatient clinic on Monday, has appointment at 0900. Pt states intent to make appointment.

## 2017-06-04 NOTE — Care Management Note (Signed)
Case Management Note  Patient Details  Name: Tara [name] MRN: [id number] Date of Birth: Sep 02, 1991  Subjective/Objective:     CHF, pregnant               Action/Plan: Discharge Planning:  NCM down to room to speak to pt and she signed out AMA.    Expected Discharge Date:                  Expected Discharge Plan:  Against Medical Advice  In-House Referral:  NA  Discharge planning Services  CM Consult  Post Acute Care Choice:  NA Choice offered to:  NA  DME Arranged:  N/A DME Agency:  NA  HH Arranged:  NA HH Agency:  NA  Status of Service:  Completed, signed off  If discussed at Long Length of Stay Meetings, dates discussed:    Additional Comments:  Elliot Cousin, RN 06/04/2017, 3:40 PM

## 2017-06-04 NOTE — ED Triage Notes (Signed)
Patient is also [redacted] weeks pregnant and was due for a D&C the first of June due to her CHF and patient missed her appointment.

## 2017-06-06 ENCOUNTER — Telehealth: Payer: Self-pay | Admitting: *Deleted

## 2017-06-06 ENCOUNTER — Ambulatory Visit: Payer: Medicaid Other | Admitting: Student

## 2017-06-06 NOTE — Telephone Encounter (Signed)
-----   Message from Garth Bigness, MD sent at 06/06/2017  3:45 PM EDT ----- Tara [name],  This patient presented to the ED this weekend and was to be transferred to [facility] but left AMA. She really needs her procedure and is likely in a HF exacerbation. Would you mind trying to give her a call? If you get her, could you again try to confirm phone numbers? And if you get her send her to the ED if she's still SOB.  Thanks,  Jae Dire
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
## 2017-06-07 ENCOUNTER — Inpatient Hospital Stay (HOSPITAL_COMMUNITY): Payer: Medicaid Other

## 2017-06-07 ENCOUNTER — Encounter (HOSPITAL_COMMUNITY): Payer: Self-pay | Admitting: *Deleted

## 2017-06-07 ENCOUNTER — Encounter: Payer: Self-pay | Admitting: Family Medicine

## 2017-06-07 ENCOUNTER — Ambulatory Visit (INDEPENDENT_AMBULATORY_CARE_PROVIDER_SITE_OTHER): Payer: Self-pay | Admitting: Family Medicine

## 2017-06-07 ENCOUNTER — Inpatient Hospital Stay (HOSPITAL_COMMUNITY)
Admission: AD | Admit: 2017-06-07 | Discharge: 2017-06-09 | DRG: 781 | Disposition: A | Discharge status: Skilled Nursing Facility | Payer: Medicaid Other | Source: Ambulatory Visit | Attending: Family Medicine | Admitting: Family Medicine

## 2017-06-07 VITALS — BP 103/70 | HR 100 | Temp 97.7°F | Ht 66.0 in | Wt 248.6 lb

## 2017-06-07 DIAGNOSIS — O99412 Diseases of the circulatory system complicating pregnancy, second trimester: Secondary | ICD-10-CM | POA: Diagnosis present

## 2017-06-07 DIAGNOSIS — E0789 Other specified disorders of thyroid: Secondary | ICD-10-CM | POA: Diagnosis present

## 2017-06-07 DIAGNOSIS — Z9119 Patient's noncompliance with other medical treatment and regimen: Secondary | ICD-10-CM | POA: Diagnosis not present

## 2017-06-07 DIAGNOSIS — I5033 Acute on chronic diastolic (congestive) heart failure: Secondary | ICD-10-CM | POA: Diagnosis present

## 2017-06-07 DIAGNOSIS — Z3A17 17 weeks gestation of pregnancy: Secondary | ICD-10-CM

## 2017-06-07 DIAGNOSIS — I110 Hypertensive heart disease with heart failure: Secondary | ICD-10-CM | POA: Diagnosis present

## 2017-06-07 DIAGNOSIS — Z889 Allergy status to unspecified drugs, medicaments and biological substances status: Secondary | ICD-10-CM | POA: Diagnosis not present

## 2017-06-07 DIAGNOSIS — O99512 Diseases of the respiratory system complicating pregnancy, second trimester: Secondary | ICD-10-CM | POA: Diagnosis present

## 2017-06-07 DIAGNOSIS — Z3490 Encounter for supervision of normal pregnancy, unspecified, unspecified trimester: Secondary | ICD-10-CM

## 2017-06-07 DIAGNOSIS — R0602 Shortness of breath: Secondary | ICD-10-CM

## 2017-06-07 DIAGNOSIS — E079 Disorder of thyroid, unspecified: Secondary | ICD-10-CM

## 2017-06-07 DIAGNOSIS — Z79899 Other long term (current) drug therapy: Secondary | ICD-10-CM

## 2017-06-07 DIAGNOSIS — O4402 Placenta previa specified as without hemorrhage, second trimester: Secondary | ICD-10-CM | POA: Diagnosis present

## 2017-06-07 DIAGNOSIS — O162 Unspecified maternal hypertension, second trimester: Secondary | ICD-10-CM | POA: Diagnosis present

## 2017-06-07 DIAGNOSIS — O99212 Obesity complicating pregnancy, second trimester: Secondary | ICD-10-CM | POA: Diagnosis present

## 2017-06-07 DIAGNOSIS — O99282 Endocrine, nutritional and metabolic diseases complicating pregnancy, second trimester: Secondary | ICD-10-CM | POA: Diagnosis present

## 2017-06-07 DIAGNOSIS — O24912 Unspecified diabetes mellitus in pregnancy, second trimester: Secondary | ICD-10-CM | POA: Diagnosis present

## 2017-06-07 DIAGNOSIS — Z91013 Allergy to seafood: Secondary | ICD-10-CM | POA: Diagnosis not present

## 2017-06-07 LAB — CBC
HCT: 28.7 % — ABNORMAL LOW (ref 36.0–46.0)
HEMOGLOBIN: 9.2 g/dL — AB (ref 12.0–15.0)
MCH: 22.3 pg — ABNORMAL LOW (ref 26.0–34.0)
MCHC: 32.1 g/dL (ref 30.0–36.0)
MCV: 69.7 fL — AB (ref 78.0–100.0)
Platelets: 200 10*3/uL (ref 150–400)
RBC: 4.12 MIL/uL (ref 3.87–5.11)
RDW: 13.8 % (ref 11.5–15.5)
WBC: 9.5 10*3/uL (ref 4.0–10.5)

## 2017-06-07 LAB — BASIC METABOLIC PANEL
ANION GAP: 7 (ref 5–15)
BUN: 7 mg/dL (ref 6–20)
CALCIUM: 8.9 mg/dL (ref 8.9–10.3)
CO2: 21 mmol/L — AB (ref 22–32)
Chloride: 107 mmol/L (ref 101–111)
Creatinine, Ser: 0.36 mg/dL — ABNORMAL LOW (ref 0.44–1.00)
GFR calc Af Amer: >60 mL/min (ref >60)
GFR calc non Af Amer: >60 mL/min (ref >60)
Glucose, Bld: 122 mg/dL — ABNORMAL HIGH (ref 65–99)
POTASSIUM: 3.4 mmol/L — AB (ref 3.5–5.1)
Sodium: 135 mmol/L (ref 135–145)

## 2017-06-07 LAB — MRSA PCR SCREENING: MRSA by PCR: NEGATIVE

## 2017-06-07 LAB — MAGNESIUM: Magnesium: 1.6 mg/dL — ABNORMAL LOW (ref 1.7–2.4)

## 2017-06-07 LAB — BRAIN NATRIURETIC PEPTIDE: B Natriuretic Peptide: 166.1 pg/mL — ABNORMAL HIGH (ref 0.0–100.0)

## 2017-06-07 MED ORDER — ENOXAPARIN SODIUM 40 MG/0.4ML ~~LOC~~ SOLN: 40.0000 mg | SUBCUTANEOUS | Status: DC

## 2017-06-07 MED ORDER — POTASSIUM CHLORIDE CRYS ER 20 MEQ PO TBCR
20.0000 meq | EXTENDED_RELEASE_TABLET | Freq: Once | ORAL | Status: AC
Start: 2017-06-07 — End: 2017-06-07
  Administered 2017-06-07: 20 meq via ORAL
  Filled 2017-06-07: qty 1

## 2017-06-07 MED ORDER — SODIUM CHLORIDE 0.9% FLUSH
3.0000 mL | Freq: Two times a day (BID) | INTRAVENOUS | Status: DC
  Administered 2017-06-07 – 2017-06-09 (×4): 3 mL via INTRAVENOUS

## 2017-06-07 MED ORDER — FUROSEMIDE 10 MG/ML IJ SOLN
40.0000 mg | Freq: Every day | INTRAMUSCULAR | Status: DC
  Administered 2017-06-07 – 2017-06-08 (×2): 40 mg via INTRAVENOUS
  Filled 2017-06-07 (×2): qty 4

## 2017-06-07 MED ORDER — LABETALOL HCL 200 MG PO TABS
200.0000 mg | ORAL_TABLET | Freq: Two times a day (BID) | ORAL | Status: DC
  Administered 2017-06-07 – 2017-06-09 (×4): 200 mg via ORAL
  Filled 2017-06-07 (×4): qty 1

## 2017-06-07 NOTE — Telephone Encounter (Signed)
Reached patient via home phone number below. She still feeling short of breath, and she is amenable to coming in to see me today at 3:30pm. She thinks she will need a taxi voucher to be able to make this appointment. I have left a message with Lauren about this and also called Jeanette. Para March was able to get a taxi which will pick her up at 2:30pm. Called patient and let her know about the taxi at 2:30pm. She voiced understanding. Patient will likely need direct admission this afternoon, will notify inpatient team. Will need HF team consult, as well as OB.

## 2017-06-07 NOTE — Progress Notes (Signed)
   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 Went to ED this weekend with SOB and was recommended for admission, but she left AMA. She presents today after I called her and urged that she come in to be evaluated again. Felt much better - can now go up the steps. Found labetalol - restarted it Sunday she thinks, but still feels less SOB now. Has been taking Lasix 40mg  BID at home and does note increased urination after each dose. Has also been coughing a lot. Does not weigh herself at home. Notes that she does not come to the doctor because it continues to add to her debt and credit score. She is very hesitant to go to the hospital because she is worried if she misses work she will be evicted.   CC, SH/smoking status, and VS noted  Objective: BP 103/70 (BP Location: Left Arm, Patient Position: Sitting, Cuff Size: Large)   Pulse 100   Temp 97.7 F (36.5 C) (Oral)   Ht 5\' 6"  (1.676 m)   Wt 112.8 kg (248 lb 9.6 oz)   LMP 01/31/2017 (Exact Date)   SpO2 97%   BMI 40.13 kg/m  Gen: NAD, alert, cooperative, and tearful obese female.  HEENT: NCAT, EOMI, PERRL. Exophthalmos R > L CV: tachycardic, no murmur Resp: tachypneic, taking shallow frequent breaths, decreased breath sounds in bilateral bases Abd: SNTND, BS present, no guarding or organomegaly, gravid Ext: No edema, warm Neuro: Alert and oriented, Speech clear, No gross deficits  Assessment and plan:  Tachypnea, shortness of breath: discussed with Dr. Deirdre Priest and urged the patient to be directly admitted for IV diuresis and OB consult for possible D&E. She agreed. We offered to pay a small amount toward her rent from the indigent fund (~$100) to help waylay her fears about being evicted. See admission H&P for additional plans.   [name], MD, PGY1 06/09/2017 2:11 PM

## 2017-06-07 NOTE — H&P (Signed)
Family Medicine Teaching [facility] Admission History and Physical Service Pager: [phone]  Patient name: Tara [name] Medical record number: [id number] Date of birth: [birth year] Age: 26 y.o. Gender: female  Primary Care Provider: Garth Bigness, MD Consultants: none Code Status: FULL   Chief Complaint: SOB   Assessment and Plan: Tara [name] is a 26 y.o. female presenting with shortness of breath . PMH is significant for dilated cardiomyopathy (last EF 50-55% 04/2017), Graves' disease (poorly controlled), obesity, pregnancy at 17 weeks 5 days, tachycardia likely due to Graves' disease.   Shortness of breath: Likely due to fluid overload. Patient also reporting dry cough. Additional differential includes poorly controlled hyperthyroidism, low long volumes from persistent tachypnea. No wheezing making asthma less likely. SOB was improved over the weekend with IV lasix in ED. Could also consider PE although normal O2 sats.  -admit to tele, inpatient status -IV lasix 40mg  daily -BMP, CBC, BNP -CXR -consult cardiology for both HF and surgical clearance (Trish will have her seen first thing tomorrow morning)  CHF: last EF 50-55% in May, has been seen by Dr. Milas Kocher. Did not make HF follow up after last admission. Clinically appears volume overloaded on exam.  -IV lasix as above -optimize HF medications after termination of pregnancy if completed (add arb vs spiro)   Pregnancy: this pregnancy is not desired. Patient has been extensively counseled on the cardiac risk involved with carrying this pregnancy to term and delivery. OB has scheduled multiple D&E appointments, which the patient has not been able to keep. Discussed case with Dr. Adrian Blackwater. He is going to call me back with possible OR plans during this admission at [facility] vs transporting to [facility]. Anesthesia would like cardiac clearance for surgery.  -consult cardiology as above -OB following, discussed with Dr.  Adrian Blackwater -OB US to assess viability and anatomy  Graves Disease: poorly controlled for many years. During pregnancy, has been using only labetalol to control tachycardia. Could consider methimazole for control of hyperthyroidism, but will hold on this given possible termination of pregnancy during this admission.  -continue labetalol as above   FEN/GI: regular diet Prophylaxis: lovenox  Disposition: admit to inpatient   History of Present Illness:  Tara [name] is a 26 y.o. female presenting with worsening SOB x 3 days. Presented to ED on 6/16 with this complaint, improved on IV lasix, but left AMA. Has since found her labetalol at home and thinks this is helping her SOB somewhat. She is tachypneic. She is able to climb a few stairs, but SOB limits her from doing more. Increasing SOB with exertion. She still wants D&E performed. She is able to tell me that she could die if she carries the pregnancy to term. She is concerned about having a Advertising account planner in the hospital and needing to go to work and class, but she has agreed to admitted.   In clinic, O2 sat 97% but tachypneic. BP 103/70.   Review Of Systems: Per HPI with the following additions: denies rhinorrhea, CP, Abd pain  ROS  Patient Active Problem List   Diagnosis Date Noted  . CHF exacerbation (HCC) 06/04/2017  . Shortness of breath 04/23/2017  . Pregnancy   . Acute on chronic diastolic congestive heart failure (HCC) 03/08/2017  . Cough 02/11/2017  . Birth control counseling 01/31/2017  . Dilated cardiomyopathy (HCC)   . Pulmonary hypertension (HCC)   . Tachycardia with heart rate 100-120 beats per minute   . Noncompliance   . Graves disease 02/11/2016  .  Morbid obesity due to excess calories [facility])     Past Medical History: Past Medical History:  Diagnosis Date  . Anemia   . Anxiety   . CAP (community acquired pneumonia) 02/13/2016  . CHF (congestive heart failure) (HCC)   . Chronic bronchitis (HCC)   . Diabetes  mellitus without complication (HCC)   . Dyspnea    with exertion  . Graves disease   . Graves disease   . Headache   . Hypertension   . Myocardial infarction (HCC) 2013  . Obesity   . Pneumonia "several times"  . Seasonal allergies     Past Surgical History: Past Surgical History:  Procedure Laterality Date  . CESAREAN SECTION  2008, 2013   x 2  . CESAREAN SECTION N/A 07/21/2016   Procedure: CESAREAN SECTION;  Surgeon: Lesly Dukes, MD;  Location: [facility] BIRTHING SUITES;  Service: Obstetrics;  Laterality: N/A;  . EYE SURGERY Bilateral 2014   "for Graves Disease; eyelid retraction on left; decompression on the right""    Social History: Social History  Substance Use Topics  . Smoking status: Never Smoker  . Smokeless tobacco: Never Used  . Alcohol use No   Additional social history: lives with partner, Jeralyn Bennett, and children   Please also refer to relevant sections of EMR.  Family History: Family History  Problem Relation Age of Onset  . Hypertension Mother     Allergies and Medications: Allergies  Allergen Reactions  . Iodine Hives  . Shellfish Allergy Hives  . Tape Rash   No current facility-administered medications on file prior to encounter.    Current Outpatient Prescriptions on File Prior to Encounter  Medication Sig Dispense Refill  . Carboxymethylcellul-Glycerin (LUBRICATING EYE DROPS OP) Place 1 drop into the right eye 3 (three) times daily as needed (dry eyes).    . furosemide (LASIX) 40 MG tablet Take 1 tablet (40 mg total) by mouth daily. (Patient taking differently: Take 80 mg by mouth daily. ) 65 tablet 1  . labetalol (NORMODYNE) 200 MG tablet Take 1 tablet (200 mg total) by mouth 2 (two) times daily. (Patient not taking: Reported on 06/04/2017)    . potassium chloride SA (K-DUR,KLOR-CON) 20 MEQ tablet Take 1 tablet (20 mEq total) by mouth daily. 30 tablet 0  . [DISCONTINUED] metoprolol (LOPRESSOR) 50 MG tablet Take 50 mg by mouth 2 (two) times daily.       Objective: LMP 01/31/2017 (Exact Date)  Exam: General: 26 yo female, tachypneic in NAD  Eyes: EOMI, PERRL, exopthalmos present (R>L)  ENTM: MMM, o/p clear  Neck: supple, no JVD, no thyromegaly  Cardiovascular: tachycardic, regular rhythm, palpable pulses, 1+ pitting edema bilaterally  Respiratory: CTA B/L with normal WOB  Gastrointestinal: soft, NTND, +bs  MSK: normal ROM  Derm: warm, dry  Neuro: AAOx3, no focal deficits, speech normal Psych: normal mood and affect   Labs and Imaging: CBC BMET   Recent Labs Lab 06/04/17 0222  WBC 11.8*  HGB 10.5*  HCT 32.1*  PLT 185    Recent Labs Lab 06/04/17 0222  NA 137  K 3.5  CL 105  CO2 23  BUN 7  CREATININE 0.31*  GLUCOSE 93  CALCIUM 8.9      Garth Bigness, MD 06/07/2017, 4:47 PM PGY-1, [facility] FPTS Intern pager: [phone], text pages welcome

## 2017-06-07 NOTE — Progress Notes (Signed)
Patient arrived at 1720 via wheelchair.  Patient crying and in obvious distress.  Admitted to room 13.  Patient refused admission assessment and IV insertion.  Nurse explained need for IV due to medication.  Patient still refused IV insertion.  Patient stated she wanted to be left alone.  Patient was sending text messages with her phone and would not look at the nurse.  Patient refused to order dinner also.  Nurse reassured patient and left patient with call light and phone in reach. Elnita Maxwell, RN

## 2017-06-07 NOTE — Progress Notes (Signed)
Dr. Chanetta Marshall requested a Behavioral Health Consult.   Presenting Issue:  Acute stress with needing to go to hospital today  Issues discussed: Patient saw PCP today and will be admitted directly to the hospital. Patient agreed to go but opposes this decision and would prefer to wait 2 days so that she doesn't have to miss work and class. I reflected her concerns and emphasized the importance of prioritizing her health and safety. Patient understands her medical needs are serious but also feels that if she goes to the hospital today, she will have lost her job and failed her class by the time she is discharged.   Assessment / Plan / Recommendations: Patient would rather wait to go to the hospital because of work and school responsibilities but agreed to go today. 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 She asked if we can help her with paying her rent and bills given that she and her partner will have to miss work and may lose their jobs, and I let her know that I would talk to Dr. Chanetta Marshall about this, who plans to discuss with Dr. Deirdre Priest. I offered to discuss some coping skills to help her with relaxation or distraction to get through today but she declined.   Warmhandoff:    Warm Hand Off Completed.

## 2017-06-08 ENCOUNTER — Inpatient Hospital Stay (HOSPITAL_COMMUNITY): Payer: Medicaid Other

## 2017-06-08 DIAGNOSIS — R0602 Shortness of breath: Secondary | ICD-10-CM

## 2017-06-08 DIAGNOSIS — I5033 Acute on chronic diastolic (congestive) heart failure: Secondary | ICD-10-CM

## 2017-06-08 DIAGNOSIS — E079 Disorder of thyroid, unspecified: Secondary | ICD-10-CM

## 2017-06-08 DIAGNOSIS — Z3A17 17 weeks gestation of pregnancy: Secondary | ICD-10-CM

## 2017-06-08 LAB — ECHOCARDIOGRAM COMPLETE
AVLVOTPG: 9 mmHg
AVPHT: 228 ms
CHL CUP STROKE VOLUME: 108 mL
EERAT: 9.39
EWDT: 134 ms
FS: 37 % (ref 28–44)
Height: 66 in
IV/PV OW: 1.19
LA ID, A-P, ES: 45 mm
LA vol index: 37.7 mL/m2
LADIAMINDEX: 1.92 cm/m2
LAVOL: 88.3 mL
LAVOLA4C: 87.5 mL
LEFT ATRIUM END SYS DIAM: 45 mm
LV E/e' medial: 9.39
LV E/e'average: 9.39
LV PW d: 8.63 mm — AB (ref 0.6–1.1)
LV SIMPSON'S DISK: 59
LV TDI E'LATERAL: 14.7
LV TDI E'MEDIAL: 11.3
LV dias vol index: 78 mL/m2
LV dias vol: 183 mL — AB (ref 46–106)
LV e' LATERAL: 14.7 cm/s
LV sys vol index: 32 mL/m2
LV sys vol: 75 mL — AB (ref 14–42)
LVOT SV: 95 mL
LVOT VTI: 27.6 cm
LVOT area: 3.46 cm2
LVOT diameter: 21 mm
LVOT peak vel: 151 cm/s
MV Dec: 134
MV Peak grad: 8 mmHg
MVPKAVEL: 66.7 m/s
MVPKEVEL: 138 m/s
RV LATERAL S' VELOCITY: 19.4 cm/s
RV sys press: 66 mmHg
Reg peak vel: 358 cm/s
TAPSE: 32.3 mm
TRMAXVEL: 358 cm/s
VTI: 124 cm
WEIGHTICAEL: 3926.4 [oz_av]

## 2017-06-08 MED ORDER — CHOLESTYRAMINE 4 G PO PACK
4.0000 g | PACK | Freq: Two times a day (BID) | ORAL | Status: DC
  Administered 2017-06-08 – 2017-06-09 (×2): 4 g via ORAL
  Filled 2017-06-08 (×3): qty 1

## 2017-06-08 MED ORDER — METHIMAZOLE 10 MG PO TABS
20.0000 mg | ORAL_TABLET | Freq: Three times a day (TID) | ORAL | Status: DC
  Administered 2017-06-08 – 2017-06-09 (×4): 20 mg via ORAL
  Filled 2017-06-08 (×4): qty 2

## 2017-06-08 MED ORDER — ONDANSETRON 4 MG PO TBDP
4.0000 mg | ORAL_TABLET | Freq: Three times a day (TID) | ORAL | Status: DC | PRN
  Administered 2017-06-08 – 2017-06-09 (×2): 4 mg via ORAL
  Filled 2017-06-08 (×2): qty 1

## 2017-06-08 MED ORDER — ENOXAPARIN SODIUM 40 MG/0.4ML ~~LOC~~ SOLN
40.0000 mg | SUBCUTANEOUS | Status: DC
  Filled 2017-06-08: qty 0.4
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
## 2017-06-08 NOTE — Progress Notes (Signed)
  Echocardiogram 2D Echocardiogram has been performed.  Tara [name] M 06/08/2017, 10:05 AM

## 2017-06-08 NOTE — Consult Note (Addendum)
Advanced Heart Failure Team Consult Note  Primary Cardiologist:  Dr. [name]   Reason for Consultation: A/C diastolic CHF/ Pre-op clearance  HPI:    Tara [name] is seen today for evaluation of A/C diastolic CHF at the request of Dr. Lum Babe with family medicine service. In addition, they are requesting pre-op clearance for therapeutic abortion with h/o peripartum CM with EF 45% in 07/2016.  Tara [name] is a 26 y.o. female with chronic diastolic CHF, h/o peripartum cardiomyopathy, Poorly treated hyperthyroidism, h/o pre-eclampsia, non-compliance with medical therapy, and morbid obesity. Pt currently pregnant, confirmed by [place] 04/23/17 with 11 week 3 days gestation by crown-rump length.  Pt seen at [facility] last month with SOB with exertion, noted to have untreated hyperthyroidism. This was treated. Echo 04/25/17 50-55% and pt was cleared for therapeutic abortion with intrauterine pregnancy confirmed with [place]. She was discharged to have done as outpatient, and since then has missed several appointments and has not had this performed. Pt no showed for HF clinic follow up 05/05/17  Pt presented to [facility] 05/07/17 with worsening SOB. Pertinent labs on admission include K 3.4, Creatinine 0.36, BNP 166.  Recent TSH < 0.010 and T4 <0.4 in ED 06/04/17 for similar presentation. Admitted for further evaluation and possible therapeutic abortion this admission. Cardiology consulted for clearance.   No labs ordered for this am. Labs now pending. Weight down 2 lbs.  Of note, weight down 13 lbs from previous admission.   Feeling better. Want to go home. Denies SOB currently.   Echo today with LVEF 65-70%, likely in setting of hyperthyroid state. RV mildly reduced.  Review of Systems: [y] = yes, [ ]  = no   General: Weight gain [ ] ; Weight loss [ ] ; Anorexia [ ] ; Fatigue [y]; Fever [ ] ; Chills [ ] ; Weakness [ ]   Cardiac: Chest pain/pressure [ ] ; Resting SOB [ ] ; Exertional SOB [y]; Orthopnea [ ] ; Pedal Edema  [y]; Palpitations [y]; Syncope [ ] ; Presyncope [ ] ; Paroxysmal nocturnal dyspnea[ ]   Pulmonary: Cough [ ] ; Wheezing[ ] ; Hemoptysis[ ] ; Sputum [ ] ; Snoring [ ]   GI: Vomiting[ ] ; Dysphagia[ ] ; Melena[ ] ; Hematochezia [ ] ; Heartburn[ ] ; Abdominal pain [ ] ; Constipation [ ] ; Diarrhea [ ] ; BRBPR [ ]   GU: Hematuria[ ] ; Dysuria [ ] ; Nocturia[ ]   Vascular: Pain in legs with walking [ ] ; Pain in feet with lying flat [ ] ; Non-healing sores [ ] ; Stroke [ ] ; TIA [ ] ; Slurred speech [ ] ;  Neuro: Headaches[ ] ; Vertigo[ ] ; Seizures[ ] ; Paresthesias[ ] ;Blurred vision [ ] ; Diplopia [ ] ; Vision changes [ ]   Ortho/Skin: Arthritis [ ] ; Joint pain [ ] ; Muscle pain [ ] ; Joint swelling [ ] ; Back Pain [ ] ; Rash [ ]   Psych: Depression[y]; Anxiety[y]  Heme: Bleeding problems [ ] ; Clotting disorders [ ] ; Anemia [ ]   Endocrine: Diabetes [ ] ; Thyroid dysfunction[y]  Home Medications Prior to Admission medications   Medication Sig Start Date End Date Taking? Authorizing Provider  Carboxymethylcellul-Glycerin (LUBRICATING EYE DROPS OP) Place 1 drop into the right eye 3 (three) times daily as needed (dry eyes).   Yes [provider]  furosemide (LASIX) 40 MG tablet Take 1 tablet (40 mg total) by mouth daily. Patient taking differently: Take 80 mg by mouth daily.  04/26/17  Yes Garth Bigness, MD  labetalol (NORMODYNE) 200 MG tablet Take 1 tablet (200 mg total) by mouth 2 (two) times daily. 04/26/17  Yes Garth Bigness, MD  potassium chloride SA (  K-DUR,KLOR-CON) 20 MEQ tablet Take 1 tablet (20 mEq total) by mouth daily. Patient not taking: Reported on 06/08/2017 04/27/17   Garth Bigness, MD    Past Medical History: Past Medical History:  Diagnosis Date  . Anemia   . Anxiety   . CAP (community acquired pneumonia) 02/13/2016  . CHF (congestive heart failure) (HCC)   . Chronic bronchitis (HCC)   . Diabetes mellitus without complication (HCC)   . Dyspnea    with exertion  . Graves disease   . Graves  disease   . Headache   . Hypertension   . Myocardial infarction (HCC) 2013  . Obesity   . Pneumonia "several times"  . Seasonal allergies     Past Surgical History: Past Surgical History:  Procedure Laterality Date  . CESAREAN SECTION  2008, 2013   x 2  . CESAREAN SECTION N/A 07/21/2016   Procedure: CESAREAN SECTION;  Surgeon: Lesly Dukes, MD;  Location: [facility] BIRTHING SUITES;  Service: Obstetrics;  Laterality: N/A;  . EYE SURGERY Bilateral 2014   "for Graves Disease; eyelid retraction on left; decompression on the right""    Family History: Family History  Problem Relation Age of Onset  . Hypertension Mother     Social History: Social History   Social History  . Marital status: Single    Spouse name: N/A  . Number of children: N/A  . Years of education: N/A   Social History Main Topics  . Smoking status: Never Smoker  . Smokeless tobacco: Never Used  . Alcohol use No  . Drug use: No  . Sexual activity: Not Currently     Comment: approx 13 - [redacted] wks gestation   Other Topics Concern  . None   Social History Narrative  . None    Allergies:  Allergies  Allergen Reactions  . Iodine Hives  . Shellfish Allergy Hives  . Tape Rash    Objective:    Vital Signs:   Temp:  [97.8 F (36.6 C)-98.6 F (37 C)] 98.3 F (36.8 C) (06/20 0621) Pulse Rate:  [93-103] 102 (06/20 0621) Resp:  [20-22] 20 (06/20 0621) BP: (118-152)/(68-90) 118/90 (06/20 0621) SpO2:  [92 %-100 %] 92 % (06/20 0621) Weight:  [245 lb 6.4 oz (111.3 kg)-247 lb 2 oz (112.1 kg)] 245 lb 6.4 oz (111.3 kg) (06/20 0621) Last BM Date: 06/07/17  Weight change: Filed Weights   06/07/17 1718 06/08/17 0621  Weight: 247 lb 2 oz (112.1 kg) 245 lb 6.4 oz (111.3 kg)    Intake/Output:   Intake/Output Summary (Last 24 hours) at 06/08/17 0926 Last data filed at 06/08/17 1610  Gross per 24 hour  Intake              480 ml  Output             2200 ml  Net            -1720 ml     Physical  Exam: General:  Well appearing. NAD at rest.  HEENT: Normal x/for marked R exopthalmos  Neck: supple. JVP difficult due to body habitus, but does not appear overly elevated. Carotids 2+ bilat; no bruits. No lymphadenopathy or thyromegaly appreciated. Cor: PMI nondisplaced. Regular, tachy. No rubs, gallops or murmurs. Lungs: Clear Abdomen: Obese. Soft, nontender, nondistended. No hepatosplenomegaly. No bruits or masses. Good bowel sounds. Extremities: no cyanosis, clubbing, rash, or edema. Warm.  Neuro: alert & orientedx3, cranial nerves grossly intact. moves all 4 extremities w/o difficulty. Affect pleasant  Telemetry: Personally reviewed, NSR/ST 90-110s  Labs: Basic Metabolic Panel:  Recent Labs Lab 06/04/17 0222 06/07/17 2029  NA 137 135  K 3.5 3.4*  CL 105 107  CO2 23 21*  GLUCOSE 93 122*  BUN 7 7  CREATININE 0.31* 0.36*  CALCIUM 8.9 8.9  MG  --  1.6*    Liver Function Tests: No results for input(s): AST, ALT, ALKPHOS, BILITOT, PROT, ALBUMIN in the last 168 hours. No results for input(s): LIPASE, AMYLASE in the last 168 hours. No results for input(s): AMMONIA in the last 168 hours.  CBC:  Recent Labs Lab 06/04/17 0222 06/07/17 2029  WBC 11.8* 9.5  HGB 10.5* 9.2*  HCT 32.1* 28.7*  MCV 68.4* 69.7*  PLT 185 200    Cardiac Enzymes: No results for input(s): CKTOTAL, CKMB, CKMBINDEX, TROPONINI in the last 168 hours.  BNP: BNP (last 3 results)  Recent Labs  03/08/17 1426 04/22/17 2328 06/07/17 2029  BNP 83.3 133.0* 166.1*    ProBNP (last 3 results) No results for input(s): PROBNP in the last 8760 hours.   CBG: No results for input(s): GLUCAP in the last 168 hours.  Coagulation Studies: No results for input(s): LABPROT, INR in the last 72 hours.  Other results: EKG: Sinus tachycardia 115 bpm 06/04/17, Personally reviewed,   Imaging: Dg Chest 2 View  Result Date: 06/07/2017 CLINICAL DATA:  Acute onset of shortness of breath and productive cough.  Initial encounter. EXAM: CHEST  2 VIEW COMPARISON:  Chest radiograph performed 06/04/2017 FINDINGS: The lungs are well-aerated. Vascular congestion is noted. Left basilar and right perihilar airspace opacities may reflect pulmonary edema or pneumonia, perhaps slightly improved from the prior study. There is no evidence of pleural effusion or pneumothorax. The heart is mildly enlarged. No acute osseous abnormalities are seen. IMPRESSION: Vascular congestion and mild cardiomegaly. Left basilar and right perihilar airspace opacity may reflect pulmonary edema or pneumonia, perhaps slightly improved from the prior study. Electronically Signed   By: Roanna Raider M.D.   On: 06/07/2017 19:58      Medications:     Current Medications: . furosemide  40 mg Intravenous Daily  . labetalol  200 mg Oral BID  . sodium chloride flush  3 mL Intravenous Q12H     Infusions:    Assessment/Plan   1. 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 T4 < 0.4. - Think this is most pressing issue. Need endocrine involvement for surgical clearance.  - Per primary. 2. Acute on chronic diastolic CHF with RV strain  - Most recent echo 04/25/17 with LVEF 50-55%, Mild/Mod AR, Mild MR, Mild PI, PA peak pressure 47 mm Hg. - STAT repeat echo with thyroid storm and h/o peripartum CM. LVEF 65-70% in setting of hyperthyroid state.  Should not limit her from operative standpoint, but as above, needs endocrine for clearance with thyroid storm.  - Limited in HF meds with pregnancy.  Needs close HF follow up, but has so far been non-compliant with follow up. Will reschedule.  3. Intrauterine pregnancy - [place] this am with single viable intrauterine pregnancy with estimated 17 weeks 4 days gestational age. Complete placenta previa noted.  4. Non-compliance with medical therapy - Stressed need to keep appointments.  5. Morbid obesity - Needs to limit portions and increase activity once stable.   She  needs to be cleared from Endocrine perspective with ongoing severe hypothyroidism.   We recommended AGAINST sending patient for therapeutic abortion as outpatient, as she has  already missed several appointments.  If unable to have done here, would transfer inpatient to inpatient.   Length of Stay: 1  Luane School  06/08/2017, 9:26 AM  Advanced Heart Failure Team Pager [phone] (M-F; 7a - 4p)  Please contact [facility] for night-coverage after hours (4p -7a ) and weekends on [email or web address]  Patient seen and examined with the above-signed Advanced Practice Provider and/or Housestaff. I personally reviewed laboratory data, imaging studies and relevant notes. I independently examined the patient and formulated the important aspects of the plan. I have edited the note to reflect any of my changes or salient points. I have personally discussed the plan with the patient and/or family.  Echo reviewed personally and discussed with housestaff.  26 y/o woman with h/o peripartum CM with recovered EF, obesity and hyperthyroidism. Admitted recently with SOB/HF EF in setting of persistent hyperthyroidism and pregnancy. Was scheduled for TA but missed those appointments. She is now [redacted] weeks pregnant and is readmitted with recurrent SOB. Echo shows hyperdynamic EF ~70% with at least moderate pulmonary HTN and right-sided strain due most likely to high output HF.  Given her previous postpartum CM and ongoing hyperthyroidism with right heart strain, I feel that cotinuing pregnancy may be life-threatening for her and her fetus. Agree with plans for TA however I think she is likely too unstable for surgery at this point with the real potential to precipitate thyroid storm. Endocrine has seen and appreciate their recommendations. It would be fine to give steroids from our standpoint. Although time is certainly of the issue here, I think it would be prudent to get some stabilization of her thyroid disease  and high-output status prior to going to OR.   We will diurese gently as tolerated. Would recommend transfer to tertiary care center where abortion can be performed and her care can be coorinated with OB, cardiology and Endocrine.   Arvilla Meres, MD  5:48 PM

## 2017-06-08 NOTE — Consult Note (Addendum)
Reason for Consult:  Graves' disease, hyperthyroidism Referring Physician: [facility] Teaching Service  Tara [name] is an 26 y.o. female.   HPI: Tara [name]' disease was diagnosed in 2011 when she developed thyroid storm.  Her rapid heartbeat and associated symptoms have never been well controlled despite propylthiouracil or methimazole up to 3 tablets three times a day--"even when I was taking it".  She acknowledges non-adherence to the prescribed medicines.  She did not obtain or receive thyroid surgery or radioactive iodine therapy.  She does not recall corticosteroid therapy, but had orbit decompression for her right eye and left eyelid retraction.  Review of systems:  Weight loss of 30 pounds in recent months.    Past Medical History:  Diagnosis Date  . Anemia   . Anxiety   . CAP (community acquired pneumonia) 02/13/2016  . CHF (congestive heart failure) (HCC)   . Chronic bronchitis (HCC)   . Diabetes mellitus without complication (HCC)   . Dyspnea    with exertion  . Graves disease   . Graves disease   . Headache   . Hypertension   . Myocardial infarction (HCC) 2013  . Obesity   . Pneumonia "several times"  . Seasonal allergies     Past Surgical History:  Procedure Laterality Date  . CESAREAN SECTION  2008, 2013   x 2  . CESAREAN SECTION N/A 07/21/2016   Procedure: CESAREAN SECTION;  Surgeon: Lesly Dukes, MD;  Location: [facility] BIRTHING SUITES;  Service: Obstetrics;  Laterality: N/A;  . EYE SURGERY Bilateral 2014   "for Graves Disease; eyelid retraction on left; decompression on the right""    Family History  Problem Relation Age of Onset  . Hypertension Mother   Maternal grandfather's family had thyroid disorders.   Social History:  reports that she has never smoked. She has never used smokeless tobacco. She reports that she does not drink alcohol or use drugs.  Allergies:  Allergies  Allergen Reactions  . Iodine Hives  . Shellfish  Allergy Hives  . Tape Rash    Medications:  I have reviewed the patient's current medications. Scheduled: . enoxaparin (LOVENOX) injection  40 mg Subcutaneous Q24H  . furosemide  40 mg Intravenous Daily  . labetalol  200 mg Oral BID  . sodium chloride flush  3 mL Intravenous Q12H    ROS  General: No weight gain, no fever except Friday. Endocrine:  No cold intolerance, but heat intolerance. Eyes:  Bulging of eyes. Neck:  No hoarseness, no difficulty swallowing. Cardiovascular: Palpitations, bilateral pedal edema. Respiratory: Dyspnea. Gastrointestinal: Nausea, diarrhea, no constipation. Neurologic: No tremor, no confusion, no hallucinations, no seizure. Psychiatric: Insomnia, anxiety. Skin: No rash, no jaundice. Hematologic/lymphatic: No abnormal bleeding, no bruising. Reproductive:  Now pregnant at estimated gestational age of [redacted] weeks.  Blood pressure 119/77, pulse 90, temperature 98.2 F (36.8 C), temperature source Oral, resp. rate 20, height 5\' 6"  (1.676 m), weight 111.3 kg (245 lb 6.4 oz), last menstrual period 01/31/2017, SpO2 96 %, not currently breastfeeding. Physical Exam  General: No apparent distress. Eyes: Anicteric, scleral show and lid lag in right eye, proptosis. Neck: Supple, trachea midline. Thyroid: diffuse symmetric enlargement with thyroid bruit, no discrete nodule. Cardiovascular: Regular rhythm and rate, no murmur, normal radial pulses. Respiratory: Normal respiratory effort, coarse sounds in lower lung fields. Gastrointestinal: Normal pitch active bowel sounds, nontender abdomen without distention or appreciable hepatomegaly. Neurologic: Cranial nerves normal as tested, deep tendon reflexes 1+ and symmetric, minimal or no  tremor. Musculoskeletal: Normal muscle tone, no muscle atrophy. Skin: Mildly excessive warmth, no visible rash. Mental status: Alert, conversant, speech clear, thought logical, mildly anxious affect, no hallucinations or delusions  evident. Hematologic/lymphatic: No cervical adenopathy, no jaundice.  Lab Results  Component Value Date   TSH <0.010 (L) 06/04/2017   WBC 9.5 06/07/2017   FREET4 5.51 (H) 04/23/2017   EOSABS 0.0 10/26/2016   NEUTROABS 8.0 (H) 10/26/2016   PLT 200 06/07/2017   MCV 69.7 (L) 06/07/2017   NA 135 06/07/2017   K 3.4 (L) 06/07/2017   GLUCOSE 122 (H) 06/07/2017   CALCIUM 8.9 06/07/2017   ALBUMIN 2.9 (L) 04/26/2017   AST 17 03/29/2017   ALT 10 (L) 03/29/2017   ALKPHOS 88 03/29/2017   BILITOT 0.4 03/29/2017    Dg Chest 2 View  Result Date: 06/07/2017 CLINICAL DATA:  Acute onset of shortness of breath and productive cough. Initial encounter. EXAM: CHEST  2 VIEW COMPARISON:  Chest radiograph performed 06/04/2017 FINDINGS: The lungs are well-aerated. Vascular congestion is noted. Left basilar and right perihilar airspace opacities may reflect pulmonary edema or pneumonia, perhaps slightly improved from the prior study. There is no evidence of pleural effusion or pneumothorax. The heart is mildly enlarged. No acute osseous abnormalities are seen. IMPRESSION: Vascular congestion and mild cardiomegaly. Left basilar and right perihilar airspace opacity may reflect pulmonary edema or pneumonia, perhaps slightly improved from the prior study. Electronically Signed   By: Roanna Raider M.D.   On: 06/07/2017 19:58   US Ob Limited  Result Date: 06/08/2017 CLINICAL DATA:  Maternal hypoxia. EXAM: LIMITED OBSTETRIC ULTRASOUND AND TRANSVAGINAL OBSTETRIC ULTRASOUND COMPARISON:  Ultrasound 04/23/2017. FINDINGS: Number of Fetuses: 1 Heart Rate:  169 bpm Movement: Present Presentation: Transverse Placental Location: Posterior Previa: Complete placenta previa noted. Amniotic Fluid (Subjective): Within normal limits. BPD:  3.8cm 17w  4d MATERNAL FINDINGS: Cervix:  Appears closed. Uterus/Adnexae:  No abnormality visualized. IMPRESSION: 1.  Single viable injury pregnancy at 17 weeks 4 days. 2.  Complete placenta previa  noted.  Follow-up exam suggested. This exam is performed on an emergent basis and does not comprehensively evaluate fetal size, dating, or anatomy; follow-up complete OB US should be considered if further fetal assessment is warranted. Electronically Signed   By: Maisie Fus  Register   On: 06/08/2017 11:13   CT scan of chest on January 10, 2010:  "The visualized thyroid is within normal limits",  "There appears to be a small amount of residual thymic tissue".  Assessment/Plan: 1.  Graves' disease with:      A.  Severe hyperthyroidism (not in thyroid storm at this time)      B.  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  Goiter 2.  Thyroid disorder during second trimester of pregnancy 3.  History of non-adherence to medicines  Today I spoke with her attending physician team and a Registered Nurse in the hospital.  Today I reviewed lab reports, chest X-ray reports, chest X-ray images, and office notes.    Summary of outside records:  History of thyroid storm with report of non-ST segment elevation myocardial infarction attributed to thyroid storm.  Free T4 consistently high-normal or high.  Free and Total T3 consistently high.    For an elective surgery, recommend better control of hyperthyroidism before surgery (usually three to eight weeks to achieve this).  For an urgent procedure, we  discussed how hyperthyroidism may worsen after acute illnesses, major injuries, or surgeries--with a potential risk of life threatening thyroid storm.  Recommendations: 1.  Restart methimazole 20 mg by mouth THREE times a day (such as with TWO 10 mg tablets).   2.  Start cholestyramine 4 grams by mouth twice a day (adjunctive therapy which may clear thyroid hormone from enterohepatic circulation). 3.  Consider prednisone 10 mg one pill by mouth once a day--or a similar corticosteroid to reduce conversion of T4 to T3  (the active thyroid hormone)--but only after discussion with cardiologist since corticosteroids may promote edema and worsen heart failure. 4.  Consider daily check of Free T4 during hospital stay.   5.  Check complete blood count with differential and comprehensive metabolic panel in one week and on day before invasive procedures.  6.  Continue beta-blocker therapy.  We discussed the option of potassium iodide 1 to 2 drops by mouth three times a day 1 hour after methimazole doses.  Tara [name] is concerned about that due to her iodine allergy.    After pregnancy, consider definitive therapy for hyperthyroidism (either thyroid surgery or radioactive iodine therapy for hyperthyroidism).   After pregnancy, start selenium 100 micrograms by mouth twice a day for 6 months to address thyroid eye disease. After pregnancy, consider adding lithium 600 mg per day by mouth as an adjunct to help her prepare for definitive therapy for hyperthyroidism.   If radioactive iodine therapy is obtained, then recommend two months of prednisone to reduce the risk of worsening thyroid orbitopathy.    Informed consent obtained after discussion of anticipated benefits, alternatives, nature of therapy, and risks--including but not limited to rash, allergic reaction, , liver damage, etc. from methimazole; lowering of cholesterol levels, etc. from cholestyramine; edema, worsening of heart failure, etc. from corticosteroids.    Education provided, including patient handouts from the [employer] on "Pregnancy and Thyroid Diseases" and "Graves' Disease".    Tara [name] 06/08/2017, 4:36 PM   Addendum:  Therapeutic plasmapheresis can be effective for thyroid storm, but may increase the risk of bleeding.  The anticipated benefits might not outweigh the risk at this time.  However, that should be considered if her thyrotoxicosis worsens despite the recommended medical therapies.

## 2017-06-08 NOTE — Progress Notes (Signed)
Lab staff notified nurse that patient refused lab draw.  Doctor at bedside and made aware. Elnita Maxwell, RN

## 2017-06-08 NOTE — Progress Notes (Signed)
Family Medicine Teaching Service Daily Progress Note Intern Pager: [phone]  Patient name: Tara [name] Medical record number: [id number] Date of birth: November 29, 1991 Age: 26 y.o. Gender: female  Primary Care Provider: Garth Bigness, MD Consultants: ObGyn Code Status: Full  Assessment and Plan: Tara [name] is a 26 y.o. female presenting with shortness of breath . PMH is significant for dilated cardiomyopathy (last EF 50-55% 04/2017), Graves' disease (poorly controlled), obesity, pregnancy at 17 weeks 5 days, tachycardia likely due to Graves' disease.   #Shortness of breath 2/2 volume overload Overnight patient had 2200 ml for urine output and weight has decreased from 247 to 245 lbs.Likely due to fluid overload in the setting of mild CHF exacerbation. BNP is 166.1. Patient also reporting dry cough, pulmonary edema has improved on CXR could possibly be early sign of  Pneumonia though less likely. Patient is currently on 2L [place] just for comfort but endorses improvement in respiratory status.  --Will continue IV lasix 40mg  daily --Follow up on cardiology consult for CHF and surgical clearance. --Will wean off from [place]  #CHF, mild exacerbation Last EF 50-55% in May, has been seen by Dr. Milas Kocher. Did not make HF follow up after last admission. Clinically appears volume overloaded on exam. BNP is mildly elevated at 166.1 and urine output is 2200 ml.  --Will continue IV lasix 40 mg daily --Follow up on I/o --Daily weights --Outpatient CHF optimization with PCP  #Pregnancy, high risk This pregnancy is not desired. Patient has been extensively counseled on the cardiac risk involved with carrying this pregnancy to term and delivery. OB has scheduled multiple D&E appointments, which the patient has not been able to keep. Will follow up with OB team to arrange D&E.  --Will follow up on cardiology consult,appreciate recs. --Will also consult endocrinology given uncontrolled graves  disease --Will follow up on OB US to assess viability and anatomy  #Graves Disease, uncontrolled, Poorly controlled due to medication non adherence. Patient is unable to afford her medication. Patient is on Methimazole. During pregnancy, has been using only labetalol to control tachycardia. Could consider methimazole for control of hyperthyroidism. TSH<0.010 and T4 <0.4. Lab results not consistent with hyperthyroidism. --Would consider endocrine consult --Follow on T3 level --Consider TSH receptor antibodies given equivocal results.   FEN/GI: Heart Healthy diet PPx: Lovenox  Disposition: Pending clinical improvement  Subjective:  Patient is feeling better this morning, breathing has improved though she is currently on 2L [place] for comfort. Patient is slightly anxious about procedure but understand the reason why she is getting it.  Objective: Temp:  [97.7 F (36.5 C)-98.6 F (37 C)] 98.3 F (36.8 C) (06/20 0621) Pulse Rate:  [93-103] 102 (06/20 0621) Resp:  [20-22] 20 (06/20 0621) BP: (103-152)/(68-90) 118/90 (06/20 0621) SpO2:  [92 %-100 %] 92 % (06/20 0621) Weight:  [245 lb 6.4 oz (111.3 kg)-248 lb 9.6 oz (112.8 kg)] 245 lb 6.4 oz (111.3 kg) (06/20 1478)  Physical Exam: General: NAD, able to participate in exam Cardiac: RRR, normal heart sounds, no murmurs. 2+ radial and PT pulses bilaterally Respiratory:  normal effort, No wheezes, decrease air movement at the bases bilaterally.   Abdomen: soft, nontender, nondistended, no hepatic or splenomegaly, +BS Extremities: Mild pitting edema bilaterally Skin: warm and dry, no rashes noted Neuro: alert and oriented x4, no focal deficits Psych: Normal affect and mood  Laboratory:  Recent Labs Lab 06/04/17 0222 06/07/17 2029  WBC 11.8* 9.5  HGB 10.5* 9.2*  HCT 32.1* 28.7*  PLT 185 200  Recent Labs Lab 06/04/17 0222 06/07/17 2029  NA 137 135  K 3.5 3.4*  CL 105 107  CO2 23 21*  BUN 7 7  CREATININE 0.31* 0.36*   CALCIUM 8.9 8.9  GLUCOSE 93 122*   06/19 0701 - 06/20 0700 In: 480 [P.O.:480] Out: 2200 [Urine:2200]  Imaging/Diagnostic Tests:   Lovena Neighbours, MD 06/08/2017, 9:05 AM PGY-1, [facility] FPTS Intern pager: [phone], text pages welcome

## 2017-06-08 NOTE — Progress Notes (Signed)
PCP Note:   I have had ongoing discussions with Dr. Adrian Blackwater (OB) regarding possible D&E. We sincerely appreciate OB's care for this patient. None of the faculty OBs are comfortable with a D&E at this gestational age. Dr. Adrian Blackwater discussed case with Dr. Ezzard Standing of MFM, who reports that Dr. Casimer Bilis of [facility] is comfortable with doing the procedure at this gestational age. This would be an outpatient procedure over at [facility] once she has cardiac clearance and is optimized from CHF perspective. Importantly, there is state required paperwork that must be completed 72 hours prior to this procedure. I have shared all of this with Dr. Sydnee Cabal, who will follow up with MFMs about who will complete this paperwork and when we could schedule the procedure. We will need help from CSW to determine if Medicaid will cover transportation for this procedure or if we need to work on getting transportation via our clinic funds.   [name], MD

## 2017-06-09 DIAGNOSIS — I5083 High output heart failure: Secondary | ICD-10-CM

## 2017-06-09 DIAGNOSIS — E0590 Thyrotoxicosis, unspecified without thyrotoxic crisis or storm: Secondary | ICD-10-CM

## 2017-06-09 LAB — BASIC METABOLIC PANEL
ANION GAP: 7 (ref 5–15)
CHLORIDE: 104 mmol/L (ref 101–111)
CO2: 24 mmol/L (ref 22–32)
Calcium: 8.6 mg/dL — ABNORMAL LOW (ref 8.9–10.3)
Creatinine, Ser: 0.36 mg/dL — ABNORMAL LOW (ref 0.44–1.00)
GFR calc Af Amer: >60 mL/min (ref >60)
GFR calc non Af Amer: >60 mL/min (ref >60)
Glucose, Bld: 81 mg/dL (ref 65–99)
POTASSIUM: 3.2 mmol/L — AB (ref 3.5–5.1)
SODIUM: 135 mmol/L (ref 135–145)

## 2017-06-09 LAB — CBC WITH DIFFERENTIAL/PLATELET
BASOS PCT: 0 %
Basophils Absolute: 0 10*3/uL (ref 0.0–0.1)
EOS ABS: 0.1 10*3/uL (ref 0.0–0.7)
Eosinophils Relative: 1 %
HCT: 27.9 % — ABNORMAL LOW (ref 36.0–46.0)
HEMOGLOBIN: 8.9 g/dL — AB (ref 12.0–15.0)
LYMPHS PCT: 23 %
Lymphs Abs: 1.8 10*3/uL (ref 0.7–4.0)
MCH: 22.2 pg — AB (ref 26.0–34.0)
MCHC: 31.9 g/dL (ref 30.0–36.0)
MCV: 69.6 fL — ABNORMAL LOW (ref 78.0–100.0)
Monocytes Absolute: 0.6 10*3/uL (ref 0.1–1.0)
Monocytes Relative: 8 %
NEUTROS PCT: 68 %
Neutro Abs: 5.5 10*3/uL (ref 1.7–7.7)
Platelets: 208 10*3/uL (ref 150–400)
RBC: 4.01 MIL/uL (ref 3.87–5.11)
RDW: 13.7 % (ref 11.5–15.5)
WBC: 8 10*3/uL (ref 4.0–10.5)

## 2017-06-09 LAB — T4, FREE: FREE T4: 4.86 ng/dL — AB (ref 0.61–1.12)

## 2017-06-09 MED ORDER — CHOLESTYRAMINE 4 G PO PACK: 4.0000 g | PACK | Freq: Two times a day (BID) | ORAL | 12 refills | Status: DC

## 2017-06-09 MED ORDER — FUROSEMIDE 40 MG PO TABS
40.0000 mg | ORAL_TABLET | Freq: Every day | ORAL | Status: DC
  Administered 2017-06-09: 40 mg via ORAL
  Filled 2017-06-09: qty 1

## 2017-06-09 MED ORDER — POTASSIUM CHLORIDE CRYS ER 20 MEQ PO TBCR
40.0000 meq | EXTENDED_RELEASE_TABLET | Freq: Two times a day (BID) | ORAL | Status: DC
  Administered 2017-06-09: 40 meq via ORAL
  Filled 2017-06-09: qty 2

## 2017-06-09 MED ORDER — FUROSEMIDE 10 MG/ML IJ SOLN
40.0000 mg | Freq: Once | INTRAMUSCULAR | Status: AC
  Administered 2017-06-09: 40 mg via INTRAVENOUS
  Filled 2017-06-09: qty 4

## 2017-06-09 MED ORDER — METHIMAZOLE 10 MG PO TABS: 20.0000 mg | ORAL_TABLET | Freq: Three times a day (TID) | ORAL | Status: DC

## 2017-06-09 MED ORDER — PREDNISONE 10 MG PO TABS: 10.0000 mg | ORAL_TABLET | Freq: Every day | ORAL | Status: DC

## 2017-06-09 MED ORDER — PREDNISONE 10 MG PO TABS
10.0000 mg | ORAL_TABLET | Freq: Every day | ORAL | Status: DC
  Administered 2017-06-09: 10 mg via ORAL
  Filled 2017-06-09: qty 1

## 2017-06-09 NOTE — Progress Notes (Signed)
Subjective: Tara [name] feels "okay" this morning.  Her tremors have not recurred.    Review of systems:   Gastrointestinal: diarrhea Cardiology: no palpitations overnight  Objective: Vital signs in last 24 hours: Temp:  [97.5 F (36.4 C)-98.2 F (36.8 C)] 98.2 F (36.8 C) (06/21 4098) Pulse Rate:  [88-102] 90 (06/21 0633) Resp:  [18-20] 18 (06/21 0633) BP: (119-129)/(47-77) 122/47 (06/21 0633) SpO2:  [94 %-100 %] 100 % (06/21 1191) Weight:  [111 kg (244 lb 12.8 oz)] 111 kg (244 lb 12.8 oz) (06/21 4782) Weight change: -1.055 kg (-2 lb 5.2 oz) Last BM Date: 06/08/17  Intake/Output from previous day: 06/20 0701 - 06/21 0700 In: 1200 [P.O.:1200] Out: 1100 [Urine:1100] Intake/Output this shift: No intake/output data recorded.  General: No apparent distress. Eyes: Anicteric, scleral show, lid lag, L[place]de exophthalmometer:  right eye 24 mm, left eye 23 mm Neck: Supple, trachea midline. Thyroid: Diffuse symmetric enlargement, nontender, thyroid bruit, no discrete thyroid nodule. Cardiovascular: Regular rhythm and rate, normal radial pulses. Respiratory: Normal respiratory effort, relatively clear to auscultation. Gastrointestinal: Normal pitch bowel sounds, nontender abdomen without appreciable hepatomegaly. Neurologic: Cranial nerves normal as tested, biceps deep tendon reflexes 1+, no tremor. Musculoskeletal: Normal muscle tone, no muscle atrophy. Skin: Mildly excessive warmth, no visible rash. Mental status: Alert, conversant, speech clear but quiet, thought logical, no hallucinations or delusions evident. Hematologic/lymphatic: No cervical adenopathy, no jaundice.  Lab Results: Lab Results  Component Value Date   WBC PENDING 06/09/2017   FREET4 4.86 (H) 06/09/2017   FREET4 5.51 (H) 04/23/2017   NEUTROABS PENDING 06/09/2017   HGB 8.9 (L) 06/09/2017   PLT PENDING 06/09/2017   NA 135 06/09/2017   K 3.2 (L) 06/09/2017   GLUCOSE 81 06/09/2017   CALCIUM 8.6 (L) 06/09/2017   ALBUMIN 2.9 (L) 04/26/2017   AST 17 03/29/2017   ALT 10 (L) 03/29/2017   ALKPHOS 88 03/29/2017   BILITOT 0.4 03/29/2017    Medications:  Scheduled: . cholestyramine  4 g Oral BID  . enoxaparin (LOVENOX) injection  40 mg Subcutaneous Q24H  . furosemide  40 mg Intravenous Daily  . labetalol  200 mg Oral BID  . methimazole  20 mg Oral TID  . sodium chloride flush  3 mL Intravenous Q12H   Continuous:  [id number]  Assessment/Plan: 1.  Graves' disease with   A.  Overt hyperthyroidism  B.  Goiter  C.  Thyroid orbitopathy 2.  Microcytic anemia--hyperthyroidism may contribute to anemia, thionamides may cause anemia, but does she have iron deficiency or a hemoglobinopathy?  (consider checking ferritin level and hemoglobin electrophoresis with solubility--if not already done) 3.  Hypokalemia--furosemide may be contributing.  I will leave this to the management of her attending physician service or cardiologist. 4.  Low measured value of TOTAL T4 though this should have been elevated--sometimes interfering antibodies may cause false results (such as human anti-mouse antibodies, rheumatoid factor, or anti-T4 autoantibodies).  Consider checking a rheumatoid factor level.  Clinically and biochemically she seems a bit better since initiating medical therapy with methimazole, beta-blocker, and cholestyramine.    Please review my initial consult note regarding:  1. other adjunctive therapies such as prednisone or lithium 2. definitive therapies such as radioactive iodine therapy (concurrent with prednisone) or thyroid surgery 3. therapeutic plasmapheresis as a potential option if her thyrotoxicosis worsen  If possible, avoid iodinated contrast exposure such as from a CT scan with contrast or cardiac catheterization.    As we discussed by telephone yesterday: If she receives a procedure  at another hospital, then I recommend inpatient to inpatient transfer with preoperative endocrinology  consultation.     LOS: 2 days   Tara [name] 06/09/2017, 7:45 AM

## 2017-06-09 NOTE — Progress Notes (Signed)
Spoke with consulting Endocrinologist Dr. [name] who agrees with Heart Failure team that patient's care would be safest under supervision of a consulting Endocrinology team with D&E procedure but that procedure could be performed. He felt discharge to outpatient care would decrease likelihood of improved control of hyperthyroidism prior to procedure and would be helpful if an Endocrinologist were familiar with her case at location where procedure will be performed.   Spoke with OB Dr. Ezzard Standing who is to perform D&E. He stated this is typically an outpatient procedure but that he will contact resident team at [facility] for possibility of transfer.   Consent paperwork signed 6/20.   Dani Gobble, MD [facility], PGY-2

## 2017-06-09 NOTE — Discharge Summary (Signed)
Family Medicine Teaching [facility] Discharge Summary  Patient name: Tara [name] Medical record number: [id number] Date of birth: 25-Feb-1991 Age: 26 y.o. Gender: female Date of Admission: 06/07/2017  Date of Discharge: 06/09/2017 Admitting Physician: Doreene Eland, MD  Primary Care Provider: Garth Bigness, MD Consultants: Endocrinology, Cardiology   Indication for Hospitalization: Shortness of Breath   Discharge Diagnoses/Problem List:  CHF Grave's disease Pregnancy   Disposition: Transfer to [facility] for D&E and endocrine management  Discharge Condition: Stable  Discharge Exam:  General: Woman in no acute distress, able to participate in exam and answer questions appropriately HEENT:  marked exopthalmos  Cardiac: Tachycardia, normal heart sounds, no murmurs. 2+ radial and PT pulses bilaterally Respiratory: Improved air movement, mild crackles noted at the bases bilaterally,  normal effort Abdomen: soft, nontender, nondistended, no hepatic or splenomegaly, +BS Extremities: lower extremities edema impoving. Skin: warm and dry, no rashes noted Neuro: alert and oriented x4, no focal deficits Psych: Normal affect and mood  Brief Hospital Course:  Tara [name] is a 26 y.o. female presenting with shortness of breath . Patient is a [id number] with a past medical history significant for dilated cardiomyopathy (last EF 50-55% 04/2017), Graves' disease (poorly controlled), obesity, pregnancy at 17 weeks 5 days, tachycardia likely due to Graves' disease. Patient was directly admitted from clinic after complaining of shortness of breath likely secondary to CHF exacerbation with a BNP of 166.1. Patient also had bilateral lower extremities edema and crackles bilaterally on lung exam. Patient was started on  IV lasix and diuresed well with >2L output. On Hospital Day 2, patient was seen by Cardiology to follow up on CHF as well as medical clearance prior to D&E with Dr  Ezzard Standing [facility]). Patient had an echocardiogram that showed normal EF 65-70% with normal wall motion. Cardiology recommended endocrinology consult as they felt patient cardiac problems were likely secondary to uncontrolled Grave's disease. Patient also had an [place] that showed complete placenta previa which Dr.Newman was made aware of. Patient was seen by endocrinology who felt that patient was not in thyroid storm during this hospitalization but clearly uncontrolled due to medications non adherence.Patient's was restarted on regimen consisting of methimazole 20 mg po tid, cholestyramine 4 g bid, prednisone 10 mg daily and labetalol 200 mg bid. Given the increased risk of thyroid storm post surgery and impending D&E procedure, it was endocrinology and cardiology recommendations in agreement with primary team that patient should be admitted to an inpatient service. Patient would benefit from close follow up and coordination of care between Endocrinology, OB and Cardiology prior and post D&E. Dr. Ezzard Standing [facility] was made aware and he helped facilitate transfer to [facility] inpatient OB services. Patient was consented for D&E on 6/20.   Issues for Follow Up:  1. Patient will be transferred to [facility] for a D&E in the setting of high risk pregnancy( History of post partum Cardiomyopathy). Complete placenta previa seen on ultrasound this admission. 2. Patient has Grave's Disease that has been uncontrolled due to patient not able to afford her medications. Seen by endocrine while inpatient and started on current regimen: Methimazole 20 mg po tid, cholestyramine 4 g bid, prednisone 10 mg daily, labetalol 200 mg bid. Patient will need close Endocrinology follow up after D&E procedure due to concern for thyroid storm. 3. Patient initially admitted for dyspnea 2/2 volume overload in the setting of CHF exacerbation. Currently getting 40 mg IV lasix, planning to transition to 40 mg  po lasix tomorrow (6/22). Monitor electrolytes (hypokalemia) 4. D&E consent faxed to [facility] with hard copy in patient chart.  Significant Procedures: Echocardiogram  Significant Labs and Imaging:   Recent Labs Lab 06/04/17 0222 06/07/17 2029 06/09/17 0324  WBC 11.8* 9.5 8.0  HGB 10.5* 9.2* 8.9*  HCT 32.1* 28.7* 27.9*  PLT 185 200 208    Recent Labs Lab 06/04/17 0222 06/07/17 2029 06/09/17 0324  NA 137 135 135  K 3.5 3.4* 3.2*  CL 105 107 104  CO2 23 21* 24  GLUCOSE 93 122* 81  BUN 7 7 <5*  CREATININE 0.31* 0.36* 0.36*  CALCIUM 8.9 8.9 8.6*  MG  --  1.6*  --    TSH: <0.010 Free T4: 4.86 BNP: 166.1  Dg Chest 2 View  Result Date: 06/07/2017 CLINICAL DATA:  Acute onset of shortness of breath and productive cough. Initial encounter. EXAM: CHEST  2 VIEW COMPARISON:  Chest radiograph performed 06/04/2017 FINDINGS: The lungs are well-aerated. Vascular congestion is noted. Left basilar and right perihilar airspace opacities may reflect pulmonary edema or pneumonia, perhaps slightly improved from the prior study. There is no evidence of pleural effusion or pneumothorax. The heart is mildly enlarged. No acute osseous abnormalities are seen. IMPRESSION: Vascular congestion and mild cardiomegaly. Left basilar and right perihilar airspace opacity may reflect pulmonary edema or pneumonia, perhaps slightly improved from the prior study. Electronically Signed   By: Roanna Raider M.D.   On: 06/07/2017 19:58   US Ob Limited  Result Date: 06/08/2017 CLINICAL DATA:  Maternal hypoxia. EXAM: LIMITED OBSTETRIC ULTRASOUND AND TRANSVAGINAL OBSTETRIC ULTRASOUND COMPARISON:  Ultrasound 04/23/2017. FINDINGS: Number of Fetuses: 1 Heart Rate:  169 bpm Movement: Present Presentation: Transverse Placental Location: Posterior Previa: Complete placenta previa noted. Amniotic Fluid (Subjective): Within normal limits. BPD:  3.8cm 17w  4d MATERNAL FINDINGS: Cervix:  Appears closed. Uterus/Adnexae:   No abnormality visualized. IMPRESSION: 1.  Single viable injury pregnancy at 17 weeks 4 days. 2.  Complete placenta previa noted.  Follow-up exam suggested. This exam is performed on an emergent basis and does not comprehensively evaluate fetal size, dating, or anatomy; follow-up complete OB US should be considered if further fetal assessment is warranted. Electronically Signed   By: Maisie Fus  Register   On: 06/08/2017 11:13   Echocardiogram - Left ventricle: The cavity size was normal. Wall thickness was   normal. Systolic function was vigorous. The estimated ejection   fraction was in the range of 65% to 70%. Wall motion was normal;   there were no regional wall motion abnormalities. Left   ventricular diastolic function parameters were normal. - Ventricular septum: The contour showed diastolic flattening and   systolic flattening. - Aortic valve: There was mild regurgitation. - Right ventricle: Systolic function was mildly reduced. - Pulmonary arteries: Systolic pressure was moderately to severely   increased. PA peak pressure: 66 mm Hg (S). - Pericardium, extracardiac: A trivial pericardial effusion was   identified.   Results/Tests Pending at Time of Discharge: None  Discharge Medications:  Allergies as of 06/09/2017      Reactions   Iodine Hives   Shellfish Allergy Hives   Tape Rash      Medication List    STOP taking these medications   potassium chloride SA 20 MEQ tablet Commonly known as:  K-DUR,KLOR-CON     TAKE these medications   cholestyramine 4 g packet Commonly known as:  QUESTRAN Take 1 packet (4 g total) by mouth 2 (two) times daily.  furosemide 40 MG tablet Commonly known as:  LASIX Take 1 tablet (40 mg total) by mouth daily. What changed:  how much to take   labetalol 200 MG tablet Commonly known as:  NORMODYNE Take 1 tablet (200 mg total) by mouth 2 (two) times daily.   LUBRICATING EYE DROPS OP Place 1 drop into the right eye 3 (three) times daily  as needed (dry eyes).   methimazole 10 MG tablet Commonly known as:  TAPAZOLE Take 2 tablets (20 mg total) by mouth 3 (three) times daily.   predniSONE 10 MG tablet Commonly known as:  DELTASONE Take 1 tablet (10 mg total) by mouth daily with breakfast. Start taking on:  06/10/2017       Discharge Instructions: Please refer to Patient Instructions section of EMR for full details.  Patient was counseled important signs and symptoms that should prompt return to medical care, changes in medications, dietary instructions, activity restrictions, and follow up appointments.   Follow-Up Appointments: Follow-up Information    [facility] Follow up on 06/23/2017.   Specialty:  Cardiology Why:  at 1030 for post hospital follow up in Heart Failure Clinic. Code for parking is 7000. Contact information: [address] [id number] mc [address] [place] [address] [phone]          Lovena Neighbours, MD 06/09/2017, 1:45 PM PGY-1, [facility], [facility]

## 2017-06-09 NOTE — Progress Notes (Signed)
Doing stable on current treatment regimen. Appreciate Cardiology and Endocrinology's input. I agree that she will do well if her D& E of coordinated by OB and Endo at the same facility. We will work on her transfer today if possible.

## 2017-06-09 NOTE — Progress Notes (Signed)
Advanced Heart Failure Rounding Note  PCP: [facility] Primary Cardiologist: Dr. [name]   Subjective:    Admitted 06/07/17 with SOB, acute on chronic diastolic CHF. Also with Graves disease.   Started on IV lasix 40mg  daily. Weight down 3 pounds.   Feels fatigued today, seems anxious. Denies SOB and palpitations.    Objective:   Weight Range: 244 lb 12.8 oz (111 kg) Body mass index is 39.51 kg/m.   Vital Signs:   Temp:  [97.5 F (36.4 C)-98.2 F (36.8 C)] 98.2 F (36.8 C) (06/21 6433) Pulse Rate:  [88-102] 90 (06/21 0633) Resp:  [18-20] 18 (06/21 2951) BP: (119-129)/(47-77) 122/47 (06/21 0633) SpO2:  [94 %-100 %] 100 % (06/21 8841) Weight:  [244 lb 12.8 oz (111 kg)] 244 lb 12.8 oz (111 kg) (06/21 0633) Last BM Date: 06/08/17  Weight change: Filed Weights   06/07/17 1718 06/08/17 0621 06/09/17 6606  Weight: 247 lb 2 oz (112.1 kg) 245 lb 6.4 oz (111.3 kg) 244 lb 12.8 oz (111 kg)    Intake/Output:   Intake/Output Summary (Last 24 hours) at 06/09/17 0840 Last data filed at 06/09/17 3016  Gross per 24 hour  Intake             1200 ml  Output             1100 ml  Net              100 ml     Physical Exam: General:  Well appearing.  HEENT: normal, marked exopthalmos  Neck: supple. JVP 8cm . Carotids 2+ bilat; no bruits. No lymphadenopathy or thyromegaly appreciated. Cor: PMI nondisplaced. Tachy, regular rhythm. No rubs, gallops or murmurs. Lungs: clear bilaterally. Normal effort.  Abdomen: soft, nontender, nondistended. No hepatosplenomegaly. No bruits or masses. Good bowel sounds. Extremities: no cyanosis, clubbing, rash, edema. Warm.  Neuro: alert & orientedx3, cranial nerves grossly intact. moves all 4 extremities w/o difficulty. Affect pleasant   Telemetry: NSR/Sinus tach. 100's. Personally reviewed   Labs: CBC  Recent Labs  06/07/17 2029 06/09/17 0324  WBC 9.5 8.0  NEUTROABS  --  5.5  HGB 9.2* 8.9*  HCT 28.7* 27.9*    MCV 69.7* 69.6*  PLT 200 208   Basic Metabolic Panel  Recent Labs  06/07/17 2029 06/09/17 0324  NA 135 135  K 3.4* 3.2*  CL 107 104  CO2 21* 24  GLUCOSE 122* 81  BUN 7 <5*  CREATININE 0.36* 0.36*  CALCIUM 8.9 8.6*  MG 1.6*  --     BNP: BNP (last 3 results)  Recent Labs  03/08/17 1426 04/22/17 2328 06/07/17 2029  BNP 83.3 133.0* 166.1*    Transthoracic Echocardiography 06/08/17 Study Conclusions  - Left ventricle: The cavity size was normal. Wall thickness was   normal. Systolic function was vigorous. The estimated ejection   fraction was in the range of 65% to 70%. Wall motion was normal;   there were no regional wall motion abnormalities. Left   ventricular diastolic function parameters were normal. - Ventricular septum: The contour showed diastolic flattening and   systolic flattening. - Aortic valve: There was mild regurgitation. - Right ventricle: Systolic function was mildly reduced. - Pulmonary arteries: Systolic pressure was moderately to severely   increased. PA peak pressure: 66 mm Hg (S). - Pericardium, extracardiac: A trivial pericardial effusion was   identified.  Impressions:  - Normal LV function   Mod - severe pulmonary HTN  Imaging/Studies:  US Ob Limited  Result Date: 06/08/2017 CLINICAL DATA:  Maternal hypoxia. EXAM: LIMITED OBSTETRIC ULTRASOUND AND TRANSVAGINAL OBSTETRIC ULTRASOUND COMPARISON:  Ultrasound 04/23/2017. FINDINGS: Number of Fetuses: 1 Heart Rate:  169 bpm Movement: Present Presentation: Transverse Placental Location: Posterior Previa: Complete placenta previa noted. Amniotic Fluid (Subjective): Within normal limits. BPD:  3.8cm 17w  4d MATERNAL FINDINGS: Cervix:  Appears closed. Uterus/Adnexae:  No abnormality visualized. IMPRESSION: 1.  Single viable injury pregnancy at 17 weeks 4 days. 2.  Complete placenta previa noted.  Follow-up exam suggested. This exam is performed on an emergent basis and does not  comprehensively evaluate fetal size, dating, or anatomy; follow-up complete OB US should be considered if further fetal assessment is warranted. Electronically Signed   By: Maisie Fus  Register   On: 06/08/2017 11:13       Medications:     Scheduled Medications: . cholestyramine  4 g Oral BID  . enoxaparin (LOVENOX) injection  40 mg Subcutaneous Q24H  . furosemide  40 mg Intravenous Daily  . labetalol  200 mg Oral BID  . methimazole  20 mg Oral TID  . sodium chloride flush  3 mL Intravenous Q12H     Infusions:   PRN Medications:  ondansetron   Assessment/Plan   1. Graves disease with overt hyperthyroidism -> Thyroid storm - TSH undetectable at < 0.010. T4 < 0.4. - Dr. [name] following, on methimazole, labetalol and cholestyramine.   - T4 4.86.  2. Acute on chronic diastolic CHF with high-output HF and RV strain  - Echo 06/09/17 with EF 65-70%, PA pressure 66 mm Hg.  - Needs close HF follow up, but has so far been non-compliant with follow up. Will reschedule.  - BP stable on labetalol.  - Volume status stable. Can transition to po Lasix 40mg  daily.  3. Intrauterine pregnancy - [place] this am with single viable intrauterine pregnancy with estimated 17 weeks 4 days gestational age. Complete placenta previa noted.  - no D&E at this gestational age per Dr. Adrian Blackwater 4. Non-compliance with medical therapy - Stressed importance of follow up.  5. Morbid obesity - Needs to limit portions and increase activity once stable - Increase activity as tolerated.  6. Hypokalemia - K 3.2 - Give KCl BID today.  Length of Stay: 2   Little Ishikawa, NP  06/09/2017, 8:40 AM  Advanced Heart Failure Team Pager [phone] (M-F; 7a - 4p)  Please contact [facility] for night-coverage after hours (4p -7a ) and weekends on [email or web address]  Patient seen and examined with Suzzette Righter, NP. We discussed all aspects of the encounter. I agree with the assessment and plan as stated above.   Overall appears  more stable since initiating therapy. Volume status improving. Would give IV lasix today and can switch to po tomorrow. Supp K+  Continue to recommend transfer to tertiary care center for TA where care can be coordinated well between OB, Endocrine and Cardiology.   Arvilla Meres, MD  10:22 AM

## 2017-06-09 NOTE — Telephone Encounter (Signed)
Letter mailed to pt. Tara [name], CMA  

## 2017-06-10 ENCOUNTER — Ambulatory Visit (HOSPITAL_COMMUNITY): Payer: MEDICAID

## 2017-06-13 ENCOUNTER — Telehealth: Payer: Self-pay | Admitting: *Deleted

## 2017-06-13 NOTE — Telephone Encounter (Signed)
Patient called in from her hospital room at [facility] [phone]). States she is scheduled to have a right heart cath today and D& E tomorrow. States they also want to remove her thyroid during this hospitalization. Patient is very concerned about her bills as they are due at the end of the month and she has been out of work for so long. Patient's significant other Jeralyn Bennett) is also out of work as he has to watch the children while patient is hospitalized. They have no family in the area. Patient would like Dr. Chanetta Marshall to call her to discuss. Kinnie Feil, RN, BSN

## 2017-06-14 ENCOUNTER — Telehealth: Payer: Self-pay | Admitting: Licensed Clinical Social Worker

## 2017-06-14 NOTE — Progress Notes (Signed)
Phone Assessment  SUBJECTIVE: Tara [name] is a 26 y.o. female  referred by Leotis Shames, RN for:  Financial Stresses and assistance with paying bills.   Patient reports the following concerns: Patient is in [facility] having two procedures done and a third one this week.  She has been in and out of work due to chronic health conditions.  Patient is behind in her rent and facing eviction soon. They have exhausted all community resource for financial assistance and she does not qualify for [employer].   LIFE CONTEXT:  Family & Social: Patient lives with her three children ( 9,4,11 months) and her female friend. No support sytem all family and friends live out of town.   School/ Work: patient works at [employer] during the day and female friend works at night. Received foodstamps and children receive Medicaid.  Life changes: female friend is unable to work as he is home at night caring for the children, patient has chronic medical conditions  And three new medical procedures that required her to be admitted to the hospital.   INTERVENTIONS:Community Resource, Problem-solving teaching/coping strategies and Supportive Counseling as well as Reflective listening ASSESSMENT:  Patient currently experiencing stress.  Symptoms exacerbated by recently medical procedures and financial hardship leading to possible eviction.    PLAN: LCSW will contact [employer] to verify amount needed to avoid eviction and look for community resources.  Sammuel Hines, LCSW Licensed Clinical Social Worker [facility]   [phone] 2:10 PM

## 2017-06-14 NOTE — Telephone Encounter (Signed)
Patient left message on nurse line from hospital room at [facility] requesting return call regarding financial assistance. In-house LCSW made aware and will reach out to patient today. Kinnie Feil, RN, BSN

## 2017-06-17 ENCOUNTER — Telehealth: Payer: Self-pay | Admitting: Licensed Clinical Social Worker

## 2017-06-17 NOTE — Progress Notes (Signed)
Call from patient.  She is unable to contact her land lord.  Informed patient, LCSW located a resource that would assist with her rent.  LCSW also unable to contact [employer], several messages left.  Patient will be discharged from the [facility]) today.   Plan:  1. Patient will continue to try to contact Megan Salon and ask him to contact LCSW. 2. LCSW will f/u with patient in 3 to 5 days.  Sammuel Hines, LCSW Licensed Clinical Social Worker [facility]   [phone] 3:17 PM

## 2017-06-21 ENCOUNTER — Telehealth: Payer: Self-pay | Admitting: *Deleted

## 2017-06-21 ENCOUNTER — Telehealth: Payer: Self-pay | Admitting: Licensed Clinical Social Worker

## 2017-06-21 ENCOUNTER — Telehealth (HOSPITAL_COMMUNITY): Payer: Self-pay | Admitting: Cardiology

## 2017-06-21 NOTE — Telephone Encounter (Signed)
Attempted to reach patient to relay HFU with PCP scheduled for 06/23/2017 at 1125 directly following HFU at Heart Failure Clinic. LCSWs Annice Pih and [name] will coordinate transfer from HF clinic to [facility]. There was no answer and no VM set up. Will make second attempt AM of 7/5. Kinnie Feil, RN, BSN

## 2017-06-21 NOTE — Telephone Encounter (Signed)
ATTEMPTED TO CONTACT PATIENT REGARDING HFU APPOINTMENT REMINDER. NO ANSWER UNABLE TO LEAVE MESSAGE    CHF Clinic appointment reminder call placed to patient for upcoming post-hospital follow up.  Does understand purpose of this appointment and where CHF Clinic is located?   How is patient feeling?   Does patient have all of their medications since their recent discharge?   Patient also reminded to take all medications as prescribed on the day of his/her appointment and to bring all medications to this appointment.  Advised to call our office for tardiness or cancellations/rescheduling needs.  Stefanny Pieri,CMA

## 2017-06-21 NOTE — Progress Notes (Signed)
  06/20/17 LCSW received call from patient, reports she has spoken with her Megan Salon, he will call LCSW, also reminded patient to schedule a F/U visit at [facility] and that Lauren RN, reach out to schedule the appointment.    06/21/17 LCSW received return call from patient's Megan Salon at [employer] [phone] .  He will e-mailed invoice for amount due on patient's rent.    Called Jackie LCSW at [facility] Clinic for coordination of services/care. She will assist patient with transportation to her appointment Thursday at the [facility] Clinic. Also discussed coordination of assisting patient with removing barriers to her health care.    LCSW received call from patient, provided an update on assistance with rent.  Reviewed all information she needs for her [facility].    F/U call to patient's Megan Salon, left message that invoice was not received today.  Plan: LCSW will continue to assist patient with resources for rent, and coordinating other supportive services with Transitional care and [facility].   Sammuel Hines, LCSW Licensed Clinical Social Worker [facility]   [phone] 4:13 PM

## 2017-06-23 ENCOUNTER — Ambulatory Visit (HOSPITAL_COMMUNITY)
Admit: 2017-06-23 | Discharge: 2017-06-23 | Disposition: A | Discharge status: Home / Self Care | Payer: Medicaid Other | Source: Ambulatory Visit | Attending: Internal Medicine | Admitting: Internal Medicine

## 2017-06-23 ENCOUNTER — Encounter: Payer: Self-pay | Admitting: Licensed Clinical Social Worker

## 2017-06-23 ENCOUNTER — Encounter: Payer: Self-pay | Admitting: Family Medicine

## 2017-06-23 ENCOUNTER — Encounter (HOSPITAL_COMMUNITY): Payer: Self-pay

## 2017-06-23 ENCOUNTER — Ambulatory Visit (INDEPENDENT_AMBULATORY_CARE_PROVIDER_SITE_OTHER): Payer: Self-pay | Admitting: Family Medicine

## 2017-06-23 ENCOUNTER — Inpatient Hospital Stay: Payer: Medicaid Other | Admitting: Family Medicine

## 2017-06-23 VITALS — BP 152/80 | HR 88 | Wt 241.6 lb

## 2017-06-23 VITALS — BP 128/62 | HR 68 | Temp 98.2°F | Ht 66.0 in | Wt 241.0 lb

## 2017-06-23 DIAGNOSIS — E669 Obesity, unspecified: Secondary | ICD-10-CM | POA: Insufficient documentation

## 2017-06-23 DIAGNOSIS — F329 Major depressive disorder, single episode, unspecified: Secondary | ICD-10-CM

## 2017-06-23 DIAGNOSIS — Z6838 Body mass index (BMI) 38.0-38.9, adult: Secondary | ICD-10-CM | POA: Insufficient documentation

## 2017-06-23 DIAGNOSIS — I252 Old myocardial infarction: Secondary | ICD-10-CM | POA: Diagnosis not present

## 2017-06-23 DIAGNOSIS — I272 Pulmonary hypertension, unspecified: Secondary | ICD-10-CM

## 2017-06-23 DIAGNOSIS — F4321 Adjustment disorder with depressed mood: Secondary | ICD-10-CM

## 2017-06-23 DIAGNOSIS — E0500 Thyrotoxicosis with diffuse goiter without thyrotoxic crisis or storm: Secondary | ICD-10-CM

## 2017-06-23 DIAGNOSIS — I110 Hypertensive heart disease with heart failure: Secondary | ICD-10-CM | POA: Insufficient documentation

## 2017-06-23 DIAGNOSIS — R300 Dysuria: Secondary | ICD-10-CM | POA: Insufficient documentation

## 2017-06-23 DIAGNOSIS — Z8249 Family history of ischemic heart disease and other diseases of the circulatory system: Secondary | ICD-10-CM | POA: Insufficient documentation

## 2017-06-23 DIAGNOSIS — Z79899 Other long term (current) drug therapy: Secondary | ICD-10-CM | POA: Insufficient documentation

## 2017-06-23 DIAGNOSIS — E119 Type 2 diabetes mellitus without complications: Secondary | ICD-10-CM | POA: Diagnosis not present

## 2017-06-23 DIAGNOSIS — F32A Depression, unspecified: Secondary | ICD-10-CM | POA: Insufficient documentation

## 2017-06-23 DIAGNOSIS — I10 Essential (primary) hypertension: Secondary | ICD-10-CM

## 2017-06-23 DIAGNOSIS — Z9119 Patient's noncompliance with other medical treatment and regimen: Secondary | ICD-10-CM

## 2017-06-23 DIAGNOSIS — I5032 Chronic diastolic (congestive) heart failure: Secondary | ICD-10-CM

## 2017-06-23 DIAGNOSIS — Z91199 Patient's noncompliance with other medical treatment and regimen due to unspecified reason: Secondary | ICD-10-CM

## 2017-06-23 DIAGNOSIS — I420 Dilated cardiomyopathy: Secondary | ICD-10-CM

## 2017-06-23 DIAGNOSIS — Z3009 Encounter for other general counseling and advice on contraception: Secondary | ICD-10-CM

## 2017-06-23 LAB — POCT URINALYSIS DIP (MANUAL ENTRY)
GLUCOSE UA: NEGATIVE mg/dL
Ketones, POC UA: NEGATIVE mg/dL
NITRITE UA: NEGATIVE
Spec Grav, UA: 1.02 (ref 1.010–1.025)
UROBILINOGEN UA: 2 U/dL — AB
pH, UA: 6.5 (ref 5.0–8.0)

## 2017-06-23 LAB — CBC
HEMATOCRIT: 28.6 % — AB (ref 36.0–46.0)
Hemoglobin: 9 g/dL — ABNORMAL LOW (ref 12.0–15.0)
MCH: 23.1 pg — AB (ref 26.0–34.0)
MCHC: 31.5 g/dL (ref 30.0–36.0)
MCV: 73.3 fL — AB (ref 78.0–100.0)
Platelets: 319 10*3/uL (ref 150–400)
RBC: 3.9 MIL/uL (ref 3.87–5.11)
RDW: 15.5 % (ref 11.5–15.5)
WBC: 10.1 10*3/uL (ref 4.0–10.5)

## 2017-06-23 LAB — POCT UA - MICROSCOPIC ONLY

## 2017-06-23 LAB — BASIC METABOLIC PANEL
Anion gap: 6 (ref 5–15)
CHLORIDE: 108 mmol/L (ref 101–111)
CO2: 25 mmol/L (ref 22–32)
Calcium: 8.9 mg/dL (ref 8.9–10.3)
Creatinine, Ser: 0.41 mg/dL — ABNORMAL LOW (ref 0.44–1.00)
GFR calc Af Amer: >60 mL/min (ref >60)
GFR calc non Af Amer: >60 mL/min (ref >60)
GLUCOSE: 100 mg/dL — AB (ref 65–99)
POTASSIUM: 3.3 mmol/L — AB (ref 3.5–5.1)
Sodium: 139 mmol/L (ref 135–145)

## 2017-06-23 MED ORDER — CITALOPRAM HYDROBROMIDE 10 MG PO TABS
10.0000 mg | ORAL_TABLET | Freq: Every day | ORAL | 3 refills | Status: DC
Start: 2017-06-23 — End: 2017-06-23

## 2017-06-23 MED ORDER — CITALOPRAM HYDROBROMIDE 10 MG PO TABS: 10.0000 mg | ORAL_TABLET | Freq: Every day | ORAL | 3 refills | Status: DC

## 2017-06-23 MED ORDER — LABETALOL HCL 300 MG PO TABS: 600.0000 mg | ORAL_TABLET | Freq: Two times a day (BID) | ORAL | 3 refills | Status: DC

## 2017-06-23 MED FILL — LABETALOL HCL 300 MG TABLET: 300 | 30 days supply | Qty: 120 | Fill #0

## 2017-06-23 MED FILL — CITALOPRAM HBR 10 MG TABLET: 10 | 30 days supply | Qty: 30 | Fill #0 | Status: TO

## 2017-06-23 MED FILL — MONO-LINYAH 28 TABLET: 0.25-35 | 28 days supply | Qty: 28 | Fill #0 | Status: TO

## 2017-06-23 NOTE — Assessment & Plan Note (Signed)
The majority of her issues with adherence result from financial struggles. The patient works part-time at [employer], and is not able to work if she is at [employer] or hospitalized. She reports that she makes too much to get Medicaid. Social work has been intimately involved in her care, and the hospital is assisting with getting her rent covered. Appreciate the help of multiple social workers in this case. Medications covered today through [employer]. Patient repeatedly reports that she will take her medicines if she is able to afford them.

## 2017-06-23 NOTE — Assessment & Plan Note (Signed)
Given scale to measure home weights by [name], CSW.

## 2017-06-23 NOTE — Assessment & Plan Note (Signed)
Patient tearful, depressed as a result of her recent medical termination of pregnancy. Counseled her that this reaction is very normal. Started on Celexa 10 mg by Dr. [name], we will titrate this as able. Given suicide return precautions by myself, Dr. [name], [name].

## 2017-06-23 NOTE — Progress Notes (Signed)
Advanced Heart Failure Clinic Note   PCP: None  Primary Cardiologist: Dr Haroldine Laws  HPI: Tara Tatlock Powellis a 26 y.o.femalewith history of graves diease, HTN, s/p C-section 04/3645 complicated by post-partum HCAP, pregnancy complicated by pre-eclampsia.   Treated for CAP in ED 10/24/16 and sent home. Returned to ED and admitted 10/25/16 with acute respiratory failure with hypoxia. Intubated early morning 10/27/16 with pulmonary edema and anxiety. Later extubated to room air. Hospital course complicated by hypernatremia. Diuresed ~30 pounds. Discharge weight 241 pounds.   Admitted 2/8 -> 01/28/17 with SVT. Thought likely secondary to her poorly controlled Grave's disease. Had been out of her thyroid and BP medications for 2-3 weeks.  Admitted 6/19-6/21/18 with SOB, hyperthyroidism (had not been taking methimazole). Hospital course complicated as she was pregnant and required therapeutic abortion. She was transferred to another facility for TA. Also had right heart cath done. Results below.   She returns today for follow up. She feels SOB with very little activity, SOB with talking, can no longer work due to SOB. She says her weight at home are stable, no peripheral edema. She has an appointment with Dr. Buddy Duty at the end of July. She is depressed, does not feel like she can engage in any activity, wants to isolate herself. She lives with her boyfriend who does support her in some ways, but she still feels like she cannot take care of her 48 month old son. Says " I don't even care about holding him". Social worker called into appointment.   Right heart cath 06/13/17 at [facility] Hemodynamics: BP: 116/79, mean 98 RA: A- wave 20, V-wave 19, mean 17 RV: 60/19, RVEDP 26 PA: 49/31, mean 39 Wedge: A-wave 27, V-wave 28, mean 26 Oximetry: Pulmonary artery saturation 71.20, Aortic saturation 96% Cardiac Output via the Fick 8.40 L per minute with cardiac index of 3.68 L per minute per square meter PVR:  1.74WU  Pharmacology: - Versed 3 mg - Fentanyl 125 mcg  Impressions: 1.Biventricular elevation of intracardiac filling pressures. 2.Borderline criteria for high output heart failure, reassuring from a pulmonary hypertension perspective with normal pulmonary vascular resistance.    SH: Lives with boyfriend and infant son.  Does not drink or smoke   Review of systems complete and found to be negative unless listed in HPI.   SH:  Social History   Social History  . Marital status: Single    Spouse name: N/A  . Number of children: N/A  . Years of education: N/A   Occupational History  . Not on file.   Social History Main Topics  . Smoking status: Never Smoker  . Smokeless tobacco: Never Used  . Alcohol use No  . Drug use: No  . Sexual activity: Not Currently     Comment: approx 13 - [redacted] wks gestation   Other Topics Concern  . Not on file   Social History Narrative  . No narrative on file    FH:  Family History  Problem Relation Age of Onset  . Hypertension Mother     Past Medical History:  Diagnosis Date  . Anemia   . Anxiety   . CAP (community acquired pneumonia) 02/13/2016  . CHF (congestive heart failure) ([place])   . Chronic bronchitis ([place])   . Diabetes mellitus without complication ([place])   . Dyspnea    with exertion  . Graves disease   . Graves disease   . Headache   . Hypertension   . Myocardial infarction ([place]) 2013  .  Obesity   . Pneumonia "several times"  . Seasonal allergies     Current Outpatient Prescriptions  Medication Sig Dispense Refill  . Carboxymethylcellul-Glycerin (LUBRICATING EYE DROPS OP) Place 1 drop into the right eye 3 (three) times daily as needed (dry eyes).    . cholestyramine (QUESTRAN) 4 g packet Take 1 packet (4 g total) by mouth 2 (two) times daily. 60 each 12  . furosemide (LASIX) 40 MG tablet Take 1 tablet (40 mg total) by mouth daily. (Patient taking differently: Take 80 mg by mouth daily. ) 65 tablet 1  .  labetalol (NORMODYNE) 200 MG tablet Take 200 mg by mouth 3 (three) times daily.    . methimazole (TAPAZOLE) 10 MG tablet Take 40 mg by mouth 2 (two) times daily.    . predniSONE (DELTASONE) 10 MG tablet Take 1 tablet (10 mg total) by mouth daily with breakfast. (Patient not taking: Reported on 06/23/2017)     No current facility-administered medications for this encounter.     Vitals:   06/23/17 1050  BP: (!) 152/80  Pulse: 88  SpO2: 94%  Weight: 241 lb 9.6 oz (109.6 kg)   Wt Readings from Last 3 Encounters:  06/23/17 241 lb 9.6 oz (109.6 kg)  06/09/17 244 lb 12.8 oz (111 kg)  06/07/17 248 lb 9.6 oz (112.8 kg)    PHYSICAL EXAM:  General: Fatigued appearing female, tearful.  HEENT: Severe exhalamos.  Neck: Supple. JVP 7-8 cm, prominent CV wave. Carotids 2+ bilaterally; no bruits. No lymphadenopathy. Significant full thyromegaly.  Cor: PMI normal. Tachy, rhythm regular. No M/G/R noted.  Lungs: Clear in all lobes bilaterally, increased effort with talking.  Abdomen: Soft, non tender, non distended.  no HSM. No bruits or masses. +BS  Extremities: no cyanosis, clubbing, rash. 1+ pedal edema bilaterally Neuro: alert & orientedx3, cranial nerves grossly intact. Moves all 4 extremities w/o difficulty. Affect tearful  ASSESSMENT & PLAN  1. Chronic  diastolic CHF: Echo 03/980, EF 65-70%, mild aortic regurgitation, PA pressure 66 mm Hg.  - NYHA III, limited by symptoms from hyperthyroidism - Volume status stable.  - Continue lasix 40 mg daily.  - BMET today.  - Reinforced dietary restrictions and fluid restrictions.   2. Graves disease: - Seen by Dr. Buddy Duty while hospitalized.  -  Per his consult note: After pregnancy, consider definitive therapy for hyperthyroidism (either thyroid surgery or radioactive iodine therapy for hyperthyroidism).   After pregnancy, start selenium 100 micrograms by mouth twice a day for 6 months to address thyroid eye disease. After pregnancy, consider adding  lithium 600 mg per day by mouth as an adjunct to help her prepare for definitive therapy for hyperthyroidism.   If radioactive iodine therapy is obtained, then recommend two months of prednisone to reduce the risk of worsening thyroid orbitopathy - She will see Dr. Buddy Duty this month.   3. Situational depression - Patient is depressed, denies suicidal ideation. Discussed with social worker who will further assess needs and provide resources.  - Start Celexa 10 mg daily. She has an appointment with her PCP today.   4. HTN - Hypertensive. Increase labetalol to 600 mg BID.    BMET, CBC today. Follow up in 2 weeks.     Arbutus Leas, NP  10:54 AM  Patient seen and examined with Jettie Booze, NP. We discussed all aspects of the encounter. I agree with the assessment and plan as stated above.   [facility] cath and care notes from [facility] reviewed. She had  moderate PAH with normal PVR and high output.   Now s/p therapeutic abortion. Being treated by Dr. Buddy Duty for hyperthyroidism. VOlume status ok. BP remains high. Will increase labetalol.   She is extremely depressed and tearful. Feels disconnected to her 21-monthold son. Long talk with her about situation and that we are always a resource if she feels she can no longer take care of herself or her baby. [facility] Celexa 11mdaily.   I spoke to Dr. TmNathen Mayher PCP) who knows her well and we coordinated care.   Total time spent 45 minutes. Over half that time spent discussing above.   BeGlori BickersMD  11:02 PM

## 2017-06-23 NOTE — Patient Instructions (Signed)
INCREASE Labetalol to 600 mg, (two tabs) twice a day START Celexa 10 mg, one tab daily  Labs today We will only contact you if something comes back abnormal or we need to make some changes. Otherwise no news is good news!   Your physician recommends that you schedule a follow-up appointment in: 2 weeks  Do the following things EVERYDAY: 1) Weigh yourself in the morning before breakfast. Write it down and keep it in a log. 2) Take your medicines as prescribed 3) Eat low salt foods-Limit salt (sodium) to 2000 mg per day.  4) Stay as active as you can everyday 5) Limit all fluids for the day to less than 2 liters

## 2017-06-23 NOTE — Progress Notes (Signed)
CSW referred to assist patient with [facility] card application and community resources and coordinating care with CSW from [place], [place].  Patient is known to this CSW from previous visits. CSW arranged for taxi transportation for today's visit as patient has missed appointments in the past due to transportation. Patient arrived today with flat affect, not making eye contact and at times tearful.  At first, patient was reluctant to share but as the visit continued she began to open up about depressive symptoms over illness and ongoing needs. Patient was seen by NP and MD who discussed heart failure symptoms, duration of illness as well as depressive symptoms and support from HF team. Patient admits she is depressed and struggling with illness and young children. Patient's only support is the father of her youngest who resides in the home. Patient was prescribed an anti depressant and prescription sent to outpatient pharmacy for pick up today. Patient denies any suicidal or homicidal ideation and is open to some counseling to help her through this time. Patient has appointment at [facility] and CSW there has already established relationship with mental health support. CSW completed orange card application and discussed paramedicine program for additional support services for patient in the home environment. CSW assisted patient to [facility] and met with CSW to coordinate care and assist with obtaining medications from outpatient pharmacy for patient to take in hand after today's visit. Patient appeared grateful for support and verbalizes understanding of support and services put in place to assist her in the community. CSW continues to be available and will coordinate with paramedicine program for patient care. [name], [place], [place]

## 2017-06-23 NOTE — Patient Instructions (Signed)
It was a pleasure to see you today! Thank you for choosing [facility] for your primary care. Tara [name] was seen for hospital follow up.   Our plans for today were:  I will call you if there are any concerns with your urine. Be sure to mention your symptoms when you follow up with [facility] if they are still bothering you.   We are filling and picking up your prescriptions today.   Call us, the heart failure clinic, or go to the ED if you have thoughts of hurting yourself or if you cannot take care of your children. We want to help you.    You should return to our clinic to see Dr. Chanetta Marshall in 1 month  for birth control .   Best,  Dr. Chanetta Marshall  Etonogestrel implant What is this medicine? ETONOGESTREL (et oh noe JES trel) is a contraceptive (birth control) device. It is used to prevent pregnancy. It can be used for up to 3 years. This medicine may be used for other purposes; ask your health care provider or pharmacist if you have questions. COMMON BRAND NAME(S): Implanon, Nexplanon What should I tell my health care provider before I take this medicine? They need to know if you have any of these conditions: -abnormal vaginal bleeding -blood vessel disease or blood clots -cancer of the breast, cervix, or liver -depression -diabetes -gallbladder disease -headaches -heart disease or recent heart attack -high blood pressure -high cholesterol -kidney disease -liver disease -renal disease -seizures -tobacco smoker -an unusual or allergic reaction to etonogestrel, other hormones, anesthetics or antiseptics, medicines, foods, dyes, or preservatives -pregnant or trying to get pregnant -breast-feeding How should I use this medicine? This device is inserted just under the skin on the inner side of your upper arm by a health care professional. Talk to your pediatrician regarding the use of this medicine in children. Special care may be needed. Overdosage: If you think  you have taken too much of this medicine contact a poison control center or emergency room at once. NOTE: This medicine is only for you. Do not share this medicine with others. What if I miss a dose? This does not apply. What may interact with this medicine? Do not take this medicine with any of the following medications: -amprenavir -bosentan -fosamprenavir This medicine may also interact with the following medications: -barbiturate medicines for inducing sleep or treating seizures -certain medicines for fungal infections like ketoconazole and itraconazole -grapefruit juice -griseofulvin -medicines to treat seizures like carbamazepine, felbamate, oxcarbazepine, phenytoin, topiramate -modafinil -phenylbutazone -rifampin -rufinamide -some medicines to treat HIV infection like atazanavir, indinavir, lopinavir, nelfinavir, tipranavir, ritonavir -St. John's wort This list may not describe all possible interactions. Give your health care provider a list of all the medicines, herbs, non-prescription drugs, or dietary supplements you use. Also tell them if you smoke, drink alcohol, or use illegal drugs. Some items may interact with your medicine. What should I watch for while using this medicine? This product does not protect you against HIV infection (AIDS) or other sexually transmitted diseases. You should be able to feel the implant by pressing your fingertips over the skin where it was inserted. Contact your doctor if you cannot feel the implant, and use a non-hormonal birth control method (such as condoms) until your doctor confirms that the implant is in place. If you feel that the implant may have broken or become bent while in your arm, contact your healthcare provider. What side effects may I notice from  receiving this medicine? Side effects that you should report to your doctor or health care professional as soon as possible: -allergic reactions like skin rash, itching or hives,  swelling of the face, lips, or tongue -breast lumps -changes in emotions or moods -depressed mood -heavy or prolonged menstrual bleeding -pain, irritation, swelling, or bruising at the insertion site -scar at site of insertion -signs of infection at the insertion site such as fever, and skin redness, pain or discharge -signs of pregnancy -signs and symptoms of a blood clot such as breathing problems; changes in vision; chest pain; severe, sudden headache; pain, swelling, warmth in the leg; trouble speaking; sudden numbness or weakness of the face, arm or leg -signs and symptoms of liver injury like dark yellow or brown urine; general ill feeling or flu-like symptoms; light-colored stools; loss of appetite; nausea; right upper belly pain; unusually weak or tired; yellowing of the eyes or skin -unusual vaginal bleeding, discharge -signs and symptoms of a stroke like changes in vision; confusion; trouble speaking or understanding; severe headaches; sudden numbness or weakness of the face, arm or leg; trouble walking; dizziness; loss of balance or coordination Side effects that usually do not require medical attention (report to your doctor or health care professional if they continue or are bothersome): -acne -back pain -breast pain -changes in weight -dizziness -general ill feeling or flu-like symptoms -headache -irregular menstrual bleeding -nausea -sore throat -vaginal irritation or inflammation This list may not describe all possible side effects. Call your doctor for medical advice about side effects. You may report side effects to FDA at 1-800-FDA-1088. Where should I keep my medicine? This drug is given in a hospital or clinic and will not be stored at home. NOTE: This sheet is a summary. It may not cover all possible information. If you have questions about this medicine, talk to your doctor, pharmacist, or health care provider.  2018 Elsevier/Gold Standard (2016-06-24  11:19:22)   Levonorgestrel intrauterine device (IUD) What is this medicine? LEVONORGESTREL IUD (LEE voe nor jes trel) is a contraceptive (birth control) device. The device is placed inside the uterus by a healthcare professional. It is used to prevent pregnancy. This device can also be used to treat heavy bleeding that occurs during your period. This medicine may be used for other purposes; ask your health care provider or pharmacist if you have questions. COMMON BRAND NAME(S): Cameron Ali What should I tell my health care provider before I take this medicine? They need to know if you have any of these conditions: -abnormal Pap smear -cancer of the breast, uterus, or cervix -diabetes -endometritis -genital or pelvic infection now or in the past -have more than one sexual partner or your partner has more than one partner -heart disease -history of an ectopic or tubal pregnancy -immune system problems -IUD in place -liver disease or tumor -problems with blood clots or take blood-thinners -seizures -use intravenous drugs -uterus of unusual shape -vaginal bleeding that has not been explained -an unusual or allergic reaction to levonorgestrel, other hormones, silicone, or polyethylene, medicines, foods, dyes, or preservatives -pregnant or trying to get pregnant -breast-feeding How should I use this medicine? This device is placed inside the uterus by a health care professional. Talk to your pediatrician regarding the use of this medicine in children. Special care may be needed. Overdosage: If you think you have taken too much of this medicine contact a poison control center or emergency room at once. NOTE: This medicine is only for you.  Do not share this medicine with others. What if I miss a dose? This does not apply. Depending on the brand of device you have inserted, the device will need to be replaced every 3 to 5 years if you wish to continue using this type of  birth control. What may interact with this medicine? Do not take this medicine with any of the following medications: -amprenavir -bosentan -fosamprenavir This medicine may also interact with the following medications: -aprepitant -armodafinil -barbiturate medicines for inducing sleep or treating seizures -bexarotene -boceprevir -griseofulvin -medicines to treat seizures like carbamazepine, ethotoin, felbamate, oxcarbazepine, phenytoin, topiramate -modafinil -pioglitazone -rifabutin -rifampin -rifapentine -some medicines to treat HIV infection like atazanavir, efavirenz, indinavir, lopinavir, nelfinavir, tipranavir, ritonavir -St. John's wort -warfarin This list may not describe all possible interactions. Give your health care provider a list of all the medicines, herbs, non-prescription drugs, or dietary supplements you use. Also tell them if you smoke, drink alcohol, or use illegal drugs. Some items may interact with your medicine. What should I watch for while using this medicine? Visit your doctor or health care professional for regular check ups. See your doctor if you or your partner has sexual contact with others, becomes HIV positive, or gets a sexual transmitted disease. This product does not protect you against HIV infection (AIDS) or other sexually transmitted diseases. You can check the placement of the IUD yourself by reaching up to the top of your vagina with clean fingers to feel the threads. Do not pull on the threads. It is a good habit to check placement after each menstrual period. Call your doctor right away if you feel more of the IUD than just the threads or if you cannot feel the threads at all. The IUD may come out by itself. You may become pregnant if the device comes out. If you notice that the IUD has come out use a backup birth control method like condoms and call your health care provider. Using tampons will not change the position of the IUD and are okay to  use during your period. This IUD can be safely scanned with magnetic resonance imaging (MRI) only under specific conditions. Before you have an MRI, tell your healthcare provider that you have an IUD in place, and which type of IUD you have in place. What side effects may I notice from receiving this medicine? Side effects that you should report to your doctor or health care professional as soon as possible: -allergic reactions like skin rash, itching or hives, swelling of the face, lips, or tongue -fever, flu-like symptoms -genital sores -high blood pressure -no menstrual period for 6 weeks during use -pain, swelling, warmth in the leg -pelvic pain or tenderness -severe or sudden headache -signs of pregnancy -stomach cramping -sudden shortness of breath -trouble with balance, talking, or walking -unusual vaginal bleeding, discharge -yellowing of the eyes or skin Side effects that usually do not require medical attention (report to your doctor or health care professional if they continue or are bothersome): -acne -breast pain -change in sex drive or performance -changes in weight -cramping, dizziness, or faintness while the device is being inserted -headache -irregular menstrual bleeding within first 3 to 6 months of use -nausea This list may not describe all possible side effects. Call your doctor for medical advice about side effects. You may report side effects to FDA at 1-800-FDA-1088. Where should I keep my medicine? This does not apply. NOTE: This sheet is a summary. It may not cover all possible information.  If you have questions about this medicine, talk to your doctor, pharmacist, or health care provider.  2018 Elsevier/Gold Standard (2016-09-17 14:14:56)

## 2017-06-23 NOTE — Progress Notes (Signed)
CC: Hospital follow up  HPI Recently hospitalized at [facility], and then transferred to [name] for MFM to perform medical termination of pregnancy.  both endocrine and heart failure teams evaluated her at both hospitals. D and E was successful, the patient is having emotional side effects due to this.   When to her heart failure follow-up this morning, where they drew labs and increased her labetalol to 600 twice a day. Reviewed this with her, she understands this change. Heart failure team continued her Lasix at 40 mg per day. She reported to Dr. Milas Kocher that she has been crying a lot, and feels like she cannot interact with her 27-month-old child. She lives at home with Tara [name], the father of her most recent child, and her 3 children. To me, she also notes struggles with "mental health." She tells me that she has nightmares of babies screaming during her procedure. She denies suicidal intent or plan. She reports that she would like to just be by herself. She says that Tara [name] is helping, but she has not talked to him about these issues.  Regarding endocrinology, she knows that she should follow-up with Dr. [name] as she is able, however she reports she has to get insurance to do this.  For birth control, she reports that she would like to take pills. She says she cannot do double, because it made her hair fall out, made her gain weight, and made her feel crazy.  She says she does not feel like having sex.   CC, SH/smoking status, and VS noted  Objective: BP 128/62   Pulse 68   Temp 98.2 F (36.8 C) (Oral)   Ht 5\' 6"  (1.676 m)   Wt 241 lb (109.3 kg)   LMP 01/31/2017 (Exact Date)   SpO2 96%   BMI 38.90 kg/m  Gen: NAD, alert, cooperative, and tearful obese female.  HEENT: NCAT, EOMI, PERRL CV: RRR, no murmur Resp: CTAB, no wheezes, non-labored Abd: SNTND, BS present, no guarding or organomegaly Ext: No edema, warm Neuro: Alert and oriented, Speech clear, No gross deficits  Assessment  and plan:  Birth control counseling Reviewed options for birth control, patient declined Depo, nexplanon, and Mirena. She was willing to read about nexplanon and Mirena. She reports that she has had financial struggles with obtaining pills in the past. Prescribed combined OCPs, and paid for this through the [employer]. [name], CSW, assisted patient picking up all of her medicines from the outpatient pharmacy today. Requested that the patient follow-up with me in one month to discuss additional birth control options.  Noncompliance The majority of her issues with adherence result from financial struggles. The patient works part-time at [employer], and is not able to work if she is at [employer] or hospitalized. She reports that she makes too much to get Medicaid. Social work has been intimately involved in her care, and the hospital is assisting with getting her rent covered. Appreciate the help of multiple social workers in this case. Medications covered today through [employer]. Patient repeatedly reports that she will take her medicines if she is able to afford them.   Depression Patient tearful, depressed as a result of her recent medical termination of pregnancy. Counseled her that this reaction is very normal. Started on Celexa 10 mg by Dr. [name], we will titrate this as able. Given suicide return precautions by myself, Dr. [name], [name].  Dilated cardiomyopathy (HCC) Given scale to measure home weights by [name], CSW.  Orders Placed This Encounter  Procedures  . Urine Culture  . POCT urinalysis dipstick  . POCT UA - Microscopic Only    No orders of the defined types were placed in this encounter.   [name], MD, PGY2 06/23/2017 2:54 PM

## 2017-06-23 NOTE — Assessment & Plan Note (Signed)
Patient reports painful sensation when urinating since procedure. States that it is not burning, might be described as cramping. UA today suggestive of possible infection. Will culture. Patient reports that she has follow-up with [facility] next week, I encouraged her to discuss these symptoms with the OB/GYN's, should this not turn out to be an infection.

## 2017-06-23 NOTE — Telephone Encounter (Signed)
Reached patient to make her aware of today's HFU with PCP following HF appt. Patient asked if taxi could pick her up at 1045 instead of 0945 2/2 feeling tired after being up with son during night. Explained that HF appt is at 1030. Patient understood and agreed to 0945 arrival of taxi. Kinnie Feil, RN, BSN

## 2017-06-23 NOTE — Progress Notes (Signed)
u

## 2017-06-23 NOTE — Assessment & Plan Note (Addendum)
Reviewed options for birth control, patient declined Depo, nexplanon, and Mirena. She was willing to read about nexplanon and Mirena. She reports that she has had financial struggles with obtaining pills in the past. Prescribed combined OCPs, and paid for this through the [employer]. Considered the risk of estrogen containing OCPs increasing fluid retention, but given patient's history of easily becoming pregnancy and trouble with running out of medications, OCPs were favored over POPs for increased effectiveness should a dose be missed.  [name], CSW, assisted patient picking up all of her medicines from the outpatient pharmacy today. Requested that the patient follow-up with me in one month to discuss additional birth control options.

## 2017-06-23 NOTE — Progress Notes (Signed)
  Total time:30 minutes Type of Service: Integrated Behavioral Health- Individual Interpretor:No.  Interpreter Name and Language:NA  SUBJECTIVE: Tara [name] is a 26 y.o. female  Patient was referred by Dr. Chanetta Marshall for:  Postpartum depression   Patient reports the following symptoms/concerns: depression and feeling down Patient has hx of depression since she was 25 years old and has been in and out of therapy most of her life. Last therapist seen was 4 years ago.  Saw therapist for about 1 year.   Hx of SI at age 33.  No prior attempts. Patient denies SI & HI at this time.  Main stressors are financial ( rent) and her health.  Reason for follow-up: provide community resources, coordination of care with LCSW at [facility] and ongoing supportive counseling. GOALS: Patient will reduce symptoms of: depression and stress, , and increase knowledge and/or ability OF:HQRFXJ skills, self-management skills and stress reduction,  Intervention: Supportive Counseling and Link to [facility], Reflective listening,  Referral to [facility] provider    ASSESSMENT:Patient currently experiencing Postpartum depression.  Symptoms exacerbated by recent termination of pregnancy, financial stressors and chronic health conditions. LCSW provided resources for therapy and discussed other community resources.  Patient may benefit from, and is in agreement to receive further assessment and therapeutic interventions to assist with managing her symptoms. Provided patient a scale to monitor her weight, picked up medication from outpatient pharmacy due to concern with compliance.   PLAN: 1. Patient will make appointment with LCSW at [facility] or Heart failure clinic if needed for brief interventions  2. Behavioral recommendations: take Celexa as prescribed by MD.  3. Referral: DSS for Medicaid, Family Services for on going therapy; information for Postpartum group 4.  LCSW will F/U via phone call  in 3 business days and continue to assist with resources for rent  Sammuel Hines, LCSW Licensed Clinical Social Worker [facility]   [phone] 2:34 PM

## 2017-06-25 LAB — URINE CULTURE

## 2017-06-27 ENCOUNTER — Telehealth: Payer: Self-pay | Admitting: Family Medicine

## 2017-06-27 ENCOUNTER — Telehealth: Payer: Self-pay | Admitting: Licensed Clinical Social Worker

## 2017-06-27 MED ORDER — AMOXICILLIN-POT CLAVULANATE 875-125 MG PO TABS: 1.0000 | ORAL_TABLET | Freq: Two times a day (BID) | ORAL | 0 refills | Status: DC

## 2017-06-27 NOTE — Telephone Encounter (Signed)
Attempted to call patient to check on her urinary symptoms (urine culture with pan-sensitive E coli) and to review the risk of increased fluid retention with OCPs, but no answer on either line. I will send an antibiotic over to the pharmacy, should she call back. Precepted with Dr. McDiarmid, elected for augmentin x 5 days to minimize cost. Please try to call her again today.

## 2017-06-27 NOTE — Telephone Encounter (Signed)
CSW contacted patient by phone to follow up on last week's visit. Patient reports she is doing better today and has all her medications. Patient states Jeralyn Bennett is home and supportive with care of the baby. Patient states she is struggling with food this month due to reduced income. CSW provided patient with some [employer] gift cards and she will pick up this afternoon. Patient grateful for the support.  CSW offered supportive intervention and will continue to be available for support and resources. [name], LCSW, CCSW-MCS [phone]

## 2017-06-28 ENCOUNTER — Telehealth: Payer: Self-pay | Admitting: Licensed Clinical Social Worker

## 2017-06-28 MED ORDER — AMOXICILLIN-POT CLAVULANATE 875-125 MG PO TABS: 1.0000 | ORAL_TABLET | Freq: Two times a day (BID) | ORAL | 0 refills | Status: AC

## 2017-06-28 MED FILL — AMOX-CLAV 875-125 MG TABLET: 875-125 | 5 days supply | Qty: 10 | Fill #0

## 2017-06-28 NOTE — Addendum Note (Signed)
Addended by: Shon Hale on: 06/28/2017 02:10 PM   Modules accepted: Orders

## 2017-06-28 NOTE — Progress Notes (Signed)
F/U to patient to inform her  PCP called in Rx for an antibiotic at [facility] pharmacy.  Phone rang, no answer or voice mail.    Plan: Will continue to call   Sammuel Hines, LCSW Licensed Clinical Social Worker [facility]   [phone] 10:23 AM

## 2017-06-28 NOTE — Progress Notes (Signed)
Call patient to inform her Tara [name] provided invoice for rent and that PCP called in Rx for an antibiotic at [facility] pharmacy.   Discussed barriers to picking up the medication.    LCSW spoke to [place] with the Paramedic program.  She will pick up patient's medication and deliver it tomorrow during her visit.  Patient appreciative of all the assistance.  Update provided to PCP.   Sammuel Hines, LCSW Licensed Clinical Social Worker [facility]   [phone] 2:15 PM

## 2017-06-28 NOTE — Telephone Encounter (Signed)
Rx mistakenly sent to [facility]. Resent to [facility] outpatient pharmacy under indigent fund. CSW from HF to pick up today and HF paramedicine team to take it to patient tomorrow. Appreciate coordination of care.  [name], MD

## 2017-06-29 ENCOUNTER — Other Ambulatory Visit (HOSPITAL_COMMUNITY): Payer: Self-pay

## 2017-06-29 NOTE — Progress Notes (Signed)
Paramedicine Encounter    Patient ID: Tara [name], female    DOB: 06/26/1991, 26 y.o.   MRN: [id number]    Patient Care Team: Garth Bigness, MD as PCP - General (Family Medicine)  Patient Active Problem List   Diagnosis Date Noted  . Depression 06/23/2017  . Dysuria 06/23/2017  . Thyroid disease   . Acute on chronic diastolic congestive heart failure (HCC) 03/08/2017  . Birth control counseling 01/31/2017  . Dilated cardiomyopathy (HCC)   . Pulmonary hypertension (HCC)   . Tachycardia with heart rate 100-120 beats per minute   . Noncompliance   . Graves disease 02/11/2016  . Morbid obesity due to excess calories [facility])     Current Outpatient Prescriptions:  .  amoxicillin-clavulanate (AUGMENTIN) 875-125 MG tablet, Take 1 tablet by mouth 2 (two) times daily., Disp: 10 tablet, Rfl: 0 .  furosemide (LASIX) 40 MG tablet, Take 1 tablet (40 mg total) by mouth daily. (Patient taking differently: Take 80 mg by mouth daily. ), Disp: 65 tablet, Rfl: 1 .  labetalol (NORMODYNE) 300 MG tablet, Take 2 tablets (600 mg total) by mouth 2 (two) times daily., Disp: 120 tablet, Rfl: 3 .  predniSONE (DELTASONE) 10 MG tablet, Take 1 tablet (10 mg total) by mouth daily with breakfast., Disp: , Rfl:  .  Carboxymethylcellul-Glycerin (LUBRICATING EYE DROPS OP), Place 1 drop into the right eye 3 (three) times daily as needed (dry eyes)., Disp: , Rfl:  .  cholestyramine (QUESTRAN) 4 g packet, Take 1 packet (4 g total) by mouth 2 (two) times daily., Disp: 60 each, Rfl: 12 .  citalopram (CELEXA) 10 MG tablet, Take 1 tablet (10 mg total) by mouth daily., Disp: 30 tablet, Rfl: 3 .  methimazole (TAPAZOLE) 10 MG tablet, Take 40 mg by mouth 2 (two) times daily., Disp: , Rfl:  Allergies  Allergen Reactions  . Iodine Hives  . Shellfish Allergy Hives  . Tape Rash     Social History   Social History  . Marital status: Single    Spouse name: N/A  . Number of children: N/A  . Years of education: N/A    Occupational History  . Not on file.   Social History Main Topics  . Smoking status: Never Smoker  . Smokeless tobacco: Never Used  . Alcohol use No  . Drug use: No  . Sexual activity: Not Currently     Comment: approx 13 - [redacted] wks gestation   Other Topics Concern  . Not on file   Social History Narrative  . No narrative on file    Physical Exam  Pulmonary/Chest: No respiratory distress. She has no wheezes. She has no rales.  Abdominal: She exhibits no distension. There is no guarding.  Musculoskeletal: She exhibits no edema.  Skin: Skin is warm and dry. She is not diaphoretic.        Future Appointments Date Time Provider Department Center  07/07/2017 11:00 AM [facility] PA/NP [facility] None  07/20/2017 3:35 PM Garth Bigness, MD [facility]    ATF pt CAO x4 sitting in the living room with no complaints today.  Pt is currently weighing herself daily but hasn't been recording her readings therefore I gave her a calendar to keep track of her weights.  She stated that she has hx of being noncompliant but she's "eager to get this under control so she can move forward with her life".  She has been taking her medications but prefers to use a pill box.  I  gave her a pill box and showed her how to use it and fill it.  We also talked about heart failure and I advised her that I will give her some liteture on the subject for her to read at home.  She stated that she is still having a hard time managing depression and the celexa has given her bad headaches.  Pt stopped taking the celexa due to the headaches because they would not go away after she took tylenol.  We discussed her diet.    Pt and I discussed the [facility] program and how it could benefit her.  She agreed to [facility] visits on Friday due to her school and work schedule.  rx bottles verified and pill box filled.    LMP 01/31/2017 (Exact Date)   Weight yesterday-243     Rawleigh Rode, EMT  Paramedic 06/29/2017    ACTION: Home visit completed

## 2017-06-30 ENCOUNTER — Telehealth: Payer: Self-pay | Admitting: Licensed Clinical Social Worker

## 2017-06-30 NOTE — Telephone Encounter (Signed)
CSW received call from [facility] paramedicine program asking for resources for daycare for patient. CSW provided number for [facility] [phone]. Geraldine Contras will follow up with patient and provide resource number. CSW continues to follow for care coordination. [name], LCSW, CCSW-MCS [phone]

## 2017-07-01 ENCOUNTER — Telehealth: Payer: Self-pay | Admitting: Licensed Clinical Social Worker

## 2017-07-01 NOTE — Telephone Encounter (Signed)
Called patient as Tara [name] let me know she had good news for me. She has been approved for full medicaid, which is excellent news! I will place referral for endocrinology now. She states she is having trouble sleeping with her thyroid medicine, which has been a problem for her in the past with this medicine. She thinks she is getting migraines from the Celexa, she stopped taking this. Her nightmares are getting better, and she is able to play with her infant again. HF paramedicine came to the house this week, that went well. She didn't go to the follow up appointment with [facility] due to transportation and fear of medical bills. Burning with urination is better. She thinks she needs a sleep study from past appointments. I will have staff get her an appointment with me soon.

## 2017-07-01 NOTE — Progress Notes (Signed)
Patient contacted LCSW for assistance with her power bill. She provided copy of bill, LCSW called to verify amount needed to avoid disconnect.   Resources obtained and assisted patient with bill.    Return call to patient to provide an update that the bill has been paid. Patient very appreciative, also informed LCSW that she has been approved for Medicaid effective Aug 1st.  Would like LCSW to provide PCP an update.  Patient is ready for a referral to the endocrinologist and to address other medical and health related concerns. Update provided to PCP via in-basket.  Sammuel Hines, LCSW Licensed Clinical Social Worker [facility]   [phone] 2:26 PM

## 2017-07-04 ENCOUNTER — Telehealth: Payer: Self-pay | Admitting: Licensed Clinical Social Worker

## 2017-07-04 ENCOUNTER — Telehealth (HOSPITAL_COMMUNITY): Payer: Self-pay | Admitting: Surgery

## 2017-07-04 NOTE — Telephone Encounter (Signed)
Patient called to let us know that her weight is up 5 lbs from yesterday's weight.  She has taken an extra Lasix tablet today.  She denies any shortness of breath currently.  She does say that her "feet hurt" and are swollen.  She has an appointment already scheduled with the [facility] on Thursday July 19th.  I have advised her that I will forward her information on to a provider and call her back with any new recommendations.

## 2017-07-04 NOTE — Telephone Encounter (Signed)
Attempted to call pt, no answer and no VM

## 2017-07-04 NOTE — Telephone Encounter (Signed)
Patient called to say she was approved for Medicaid beginning August 1st. Patient relieved with the news of Medicaid and hopeful to get back on track with her finances and other medical appointments. CSW will continue to follow and coordinate with paramedicine. [name], LCSW, CCSW-MCS [phone]

## 2017-07-04 NOTE — Telephone Encounter (Signed)
If she is taking lasix 40 mg daily, increase to 40 mg bid until her appointment on Thursday.  Will need BMET that day.  Add additional 20 mEq KCl daily.

## 2017-07-04 NOTE — Telephone Encounter (Signed)
CSW attempted to contact patient to follow up on financial needs. No answer and unable to leave message. [name], LCSW, CCSW-MCS [phone]

## 2017-07-07 ENCOUNTER — Other Ambulatory Visit (HOSPITAL_COMMUNITY): Payer: Self-pay | Admitting: *Deleted

## 2017-07-07 ENCOUNTER — Inpatient Hospital Stay (HOSPITAL_COMMUNITY): Admission: RE | Admit: 2017-07-07 | Payer: Medicaid Other | Source: Ambulatory Visit

## 2017-07-07 ENCOUNTER — Telehealth (HOSPITAL_COMMUNITY): Payer: Self-pay | Admitting: *Deleted

## 2017-07-07 ENCOUNTER — Ambulatory Visit: Payer: Medicaid Other | Admitting: Family Medicine

## 2017-07-07 ENCOUNTER — Other Ambulatory Visit (HOSPITAL_COMMUNITY): Payer: Self-pay

## 2017-07-07 MED ORDER — FUROSEMIDE 40 MG PO TABS: 40.0000 mg | ORAL_TABLET | Freq: Every day | ORAL | 1 refills | Status: DC

## 2017-07-07 MED FILL — FUROSEMIDE 40 MG TABLET: 40 | 45 days supply | Qty: 45 | Fill #0

## 2017-07-07 NOTE — Progress Notes (Signed)
Paramedicine Encounter    Patient ID: Tara [name], female    DOB: [birth year], 26 y.o.   MRN: [id number]    Patient Care Team: Garth Bigness, MD as PCP - General (Family Medicine)  Patient Active Problem List   Diagnosis Date Noted  . Depression 06/23/2017  . Dysuria 06/23/2017  . Thyroid disease   . Acute on chronic diastolic congestive heart failure (HCC) 03/08/2017  . Birth control counseling 01/31/2017  . Dilated cardiomyopathy (HCC)   . Pulmonary hypertension (HCC)   . Tachycardia with heart rate 100-120 beats per minute   . Noncompliance   . Graves disease 02/11/2016  . Morbid obesity due to excess calories (HCC)     Current Outpatient Prescriptions:  .  Carboxymethylcellul-Glycerin (LUBRICATING EYE DROPS OP), Place 1 drop into the right eye 3 (three) times daily as needed (dry eyes)., Disp: , Rfl:  .  cholestyramine (QUESTRAN) 4 g packet, Take 1 packet (4 g total) by mouth 2 (two) times daily., Disp: 60 each, Rfl: 12 .  citalopram (CELEXA) 10 MG tablet, Take 1 tablet (10 mg total) by mouth daily., Disp: 30 tablet, Rfl: 3 .  furosemide (LASIX) 40 MG tablet, Take 1 tablet (40 mg total) by mouth daily., Disp: 45 tablet, Rfl: 1 .  labetalol (NORMODYNE) 300 MG tablet, Take 2 tablets (600 mg total) by mouth 2 (two) times daily., Disp: 120 tablet, Rfl: 3 .  methimazole (TAPAZOLE) 10 MG tablet, Take 40 mg by mouth 2 (two) times daily., Disp: , Rfl:  .  predniSONE (DELTASONE) 10 MG tablet, Take 1 tablet (10 mg total) by mouth daily with breakfast., Disp: , Rfl:  Allergies  Allergen Reactions  . Iodine Hives  . Shellfish Allergy Hives  . Tape Rash     Social History   Social History  . Marital status: Single    Spouse name: N/A  . Number of children: N/A  . Years of education: N/A   Occupational History  . Not on file.   Social History Main Topics  . Smoking status: Never Smoker  . Smokeless tobacco: Never Used  . Alcohol use No  . Drug use: No  . Sexual  activity: Not Currently     Comment: approx 13 - [redacted] wks gestation   Other Topics Concern  . Not on file   Social History Narrative  . No narrative on file    Physical Exam  Pulmonary/Chest: No respiratory distress. She has no wheezes. She has no rales.  Abdominal: There is no tenderness. There is no guarding.  Musculoskeletal: She exhibits edema.  +2 both feet and ankles  Skin: She is not diaphoretic.        Future Appointments Date Time Provider Department Center  07/07/2017 2:20 PM Garth Bigness, MD [facility]  07/20/2017 3:35 PM Garth Bigness, MD [facility]    ATF pt CAO x4 working.  Pt called [facility] on Monday and reported weight gain. She emailed me yesterday and advised me that she has been out of her lasix for about 2 days and she has fluid retention.  Today upon my arrival pt is sob after walking to my unit.  She stated that she has had sob while at work today.  Her lung sounds are clear/ 02 sat @ 94% RA.  She also has edema in both feet and ankles +2.  She denies pain.  [facility] and [facility] to her.  She was advised to double  up on her dose today and return to her norm of 40mg  tomorrow per Tonye Becket.  Pt repeated the instructions to me and stated that she understands.  She stated that she will be leaving work early today so she can take her meds.  Pt denies chest pain and dizziness.  Either Florentina Addison or Suzan Slick will f/u with her Monday to see if her cc improved.    Double today; reg dose tom  BP 132/72 (BP Location: Left Arm, Patient Position: Sitting, Cuff Size: Normal)   Pulse 94   Resp 16   Wt 249 lb (112.9 kg)   LMP 01/31/2017 (Exact Date)   SpO2 98%   BMI 40.19 kg/m   Weight yesterday-248 Last visit weight-239    Lougenia Morrissey, EMT Paramedic 07/07/2017    ACTION: Home visit completed

## 2017-07-07 NOTE — Telephone Encounter (Signed)
Received call from [facility] w/paramedicine, she states pt emailed her and said she had to work today and would not be able to come to her appt.  Advised Dee of pt's call earlier in the week w/wt gain and that we had not been able to reach her since to advise on increasing med.  She states pt's email did not mention any issues or concerns, pt only has a home phone and does not have answering machine and since pt works daily she is hard to get in contact with.  Geraldine Contras states she is going by pt's work later today to take her meds and will do a full assessment at that time and call us back with how pt is doing.

## 2017-07-07 NOTE — Telephone Encounter (Signed)
Dee with paramedicine called to cancel patients appt today because patient was called into work. Pt had some swelling in ankles and feet, shortness of breath with exertion, and weight gain.  She has been out of her Lasix for a few days. I sent a refill and Dee picked it up for patient.  Patient advised to double lasix dose today only and then resume normal dose tomorrow. Paramedicine will see patient again Monday if no improvement we will work patient in for an appt with Dr.McLean. Per Herbert Seta.

## 2017-07-13 ENCOUNTER — Telehealth: Payer: Self-pay | Admitting: Licensed Clinical Social Worker

## 2017-07-13 NOTE — Telephone Encounter (Signed)
CSW attempted to contact patient with no answer. Paramedic suggested email patient to communicate. CSW will email ([email or web address] )and request return call to follow up on cancelled appointment and provide supportive intervention. [name], LCSW, CCSW-MCS [phone]

## 2017-07-20 ENCOUNTER — Ambulatory Visit: Payer: Medicaid Other | Admitting: Family Medicine

## 2017-07-21 ENCOUNTER — Telehealth: Payer: Self-pay | Admitting: Licensed Clinical Social Worker

## 2017-07-21 MED FILL — methIMAzole 5 MG TABS: 5 | 30 days supply | Qty: 270 | Fill #1

## 2017-07-21 MED FILL — LABETALOL HCL 300 MG TABLET: 300 | 30 days supply | Qty: 120 | Fill #1 | Status: TO

## 2017-07-21 NOTE — Progress Notes (Signed)
LCSW informed by Annice Pih, social worker at Heart Failure clinic that patient is experiencing food insecurities. Contacted patient, she will pick up food box and additional food  resources from front office 07/22/17.   Plan: Annice Pih, Heart Failure LCSW will f/u with patient next week.   Sammuel Hines, LCSW Licensed Clinical Social Worker [facility]   [phone] 4:14 PM

## 2017-07-22 ENCOUNTER — Encounter: Payer: Self-pay | Admitting: Family Medicine

## 2017-07-22 ENCOUNTER — Other Ambulatory Visit: Payer: Self-pay | Admitting: Family Medicine

## 2017-07-22 ENCOUNTER — Ambulatory Visit (INDEPENDENT_AMBULATORY_CARE_PROVIDER_SITE_OTHER): Payer: Medicaid Other | Admitting: Family Medicine

## 2017-07-22 VITALS — BP 120/79 | HR 78 | Temp 98.2°F | Wt 243.0 lb

## 2017-07-22 DIAGNOSIS — E0500 Thyrotoxicosis with diffuse goiter without thyrotoxic crisis or storm: Secondary | ICD-10-CM

## 2017-07-22 DIAGNOSIS — L989 Disorder of the skin and subcutaneous tissue, unspecified: Secondary | ICD-10-CM

## 2017-07-22 DIAGNOSIS — Z3009 Encounter for other general counseling and advice on contraception: Secondary | ICD-10-CM

## 2017-07-22 MED ORDER — TRIAMCINOLONE ACETONIDE 0.5 % EX OINT: 1.0000 "application " | TOPICAL_OINTMENT | Freq: Two times a day (BID) | CUTANEOUS | 0 refills | Status: DC

## 2017-07-22 MED FILL — TRIAMCINOLONE 0.5% OINTMENT: 0.5 | 15 days supply | Qty: 30 | Fill #0

## 2017-07-22 NOTE — Assessment & Plan Note (Addendum)
Patient reports x >1 year, but first presentation to medical care for this. Unclear etiology. Differential includes eczema, irritant dermatitis, chronic skin changes due to excoriation. Nondermatomal, no burning pain > unlikely VZV. Will rx triamcinalone and RTC if not improving and consider biopsy.

## 2017-07-22 NOTE — Assessment & Plan Note (Signed)
Patient elects to return next week for Mirena placement.

## 2017-07-22 NOTE — Assessment & Plan Note (Addendum)
Patient unable to get endo appt yet, will message our referral coordinator and see if there is anything we can do to help. She would also like a new eye referral.

## 2017-07-22 NOTE — Patient Instructions (Signed)
It was a pleasure to see you today! Thank you for choosing [facility] for your primary care. Tara [name] was seen for skin lesions.   Our plans for today were:  Apply the cream to the areas.   Come see me next week and take 2 ibuprofen before your visit.   Best,  Dr. Chanetta Marshall

## 2017-07-22 NOTE — Progress Notes (Signed)
This patient has Graves disease and needs to see endocrinology asap. She saw Dr. [name] while hospitalized in May, and she says Medicaid is giving her trouble and saying that Dr. [name] is family medicine (I think he is family medicine trained and did Endo fellowship). Is there anything we can do to help with this referral?

## 2017-07-22 NOTE — Progress Notes (Signed)
   CC: leg lesion  HPI Son's bday was yesterday. Overall, she has been doing much better. Mood improved. HF paramedicine has been coming to her house, and she reports that is going well. She is pleased to have Medicaid.   Birth control - has been taking OCPs. Interested in Mirena  Skin lesion - right lateral lower leg with chronic x 1 year skin changes. Notes she thought it started from a bug bite, but she has been itching it off and on x 1 year. When she scratches it, it often bleeds. Mildly tender. Has not tried any topicals.   Graves - reports she has not been able to get an endo appt yet, Medicaid is concerned that Dr. [name] is family medicine.   CC, SH/smoking status, and VS noted  Objective: BP 120/79   Pulse 78   Temp 98.2 F (36.8 C) (Oral)   Wt 243 lb (110.2 kg)   LMP 01/31/2017 (Exact Date)   SpO2 98%   BMI 39.22 kg/m  Gen: NAD, alert, cooperative, and pleasant obese female.  HEENT: NCAT, EOMI, PERRL CV: RRR, no murmur Resp: CTAB, no wheezes, non-labored Abd: SNTND, BS present, no guarding or organomegaly Ext: No edema, warm. R lateral lower leg with hyperpigmented, keratotic grouping of lesions with several <1cm excoriations. Mildly TTP. Non fluctuant.  Neuro: Alert and oriented, Speech clear, No gross deficits  Assessment and plan:  Birth control counseling Patient elects to return next week for Mirena placement.   Graves disease Patient unable to get endo appt yet, will message our referral coordinator and see if there is anything we can do to help. She would also like a new eye referral.   Skin lesion of right leg Patient reports x >1 year, but first presentation to medical care for this. Unclear etiology. Differential includes eczema, irritant dermatitis, chronic skin changes due to excoriation. Nondermatomal, no burning pain > unlikely VZV. Will rx triamcinalone and RTC if not improving and consider biopsy.   No orders of the defined types were placed in this  encounter.   Meds ordered this encounter  Medications  . triamcinolone ointment (KENALOG) 0.5 %    Sig: Apply 1 application topically 2 (two) times daily.    Dispense:  30 g    Refill:  0    Tara Muse, MD, PGY2 07/22/2017 3:02 PM

## 2017-07-26 ENCOUNTER — Inpatient Hospital Stay (HOSPITAL_COMMUNITY): Admission: RE | Admit: 2017-07-26 | Payer: Medicaid Other | Source: Ambulatory Visit

## 2017-07-27 ENCOUNTER — Ambulatory Visit: Payer: Medicaid Other | Admitting: Family Medicine

## 2017-07-29 ENCOUNTER — Other Ambulatory Visit (HOSPITAL_COMMUNITY): Payer: Self-pay | Admitting: *Deleted

## 2017-07-29 ENCOUNTER — Other Ambulatory Visit (HOSPITAL_COMMUNITY): Payer: Self-pay

## 2017-07-29 MED ORDER — FUROSEMIDE 40 MG PO TABS: 40.0000 mg | ORAL_TABLET | Freq: Every day | ORAL | 1 refills | Status: DC

## 2017-07-29 MED FILL — FUROSEMIDE 40 MG TABLET: 40 | 30 days supply | Qty: 60 | Fill #0

## 2017-07-29 NOTE — Progress Notes (Signed)
CHP appointment and theres no answer at the door or phone.  I was able to pick up the furosemide that she stated she  needed.  I will f/u with her next week

## 2017-08-02 ENCOUNTER — Ambulatory Visit (HOSPITAL_COMMUNITY)
Admission: RE | Admit: 2017-08-02 | Discharge: 2017-08-02 | Disposition: A | Discharge status: Home / Self Care | Payer: Medicaid Other | Source: Ambulatory Visit | Attending: Cardiology | Admitting: Cardiology

## 2017-08-02 ENCOUNTER — Other Ambulatory Visit (HOSPITAL_COMMUNITY): Payer: Self-pay

## 2017-08-02 ENCOUNTER — Other Ambulatory Visit (HOSPITAL_COMMUNITY): Payer: Self-pay | Admitting: *Deleted

## 2017-08-02 DIAGNOSIS — I5032 Chronic diastolic (congestive) heart failure: Secondary | ICD-10-CM

## 2017-08-02 LAB — BASIC METABOLIC PANEL
Anion gap: 9 (ref 5–15)
BUN: 6 mg/dL (ref 6–20)
CALCIUM: 9.1 mg/dL (ref 8.9–10.3)
CO2: 24 mmol/L (ref 22–32)
CREATININE: 0.55 mg/dL (ref 0.44–1.00)
Chloride: 106 mmol/L (ref 101–111)
Glucose, Bld: 91 mg/dL (ref 65–99)
Potassium: 4.1 mmol/L (ref 3.5–5.1)
Sodium: 139 mmol/L (ref 135–145)

## 2017-08-02 NOTE — Progress Notes (Signed)
Paramedicine Encounter    Patient ID: Tara [name], female    DOB: 27-Sep-1991, 26 y.o.   MRN: [id number]    Patient Care Team: Garth Bigness, MD as PCP - General (Family Medicine)  Patient Active Problem List   Diagnosis Date Noted  . Skin lesion of right leg 07/22/2017  . Depression 06/23/2017  . Thyroid disease   . Acute on chronic diastolic congestive heart failure (HCC) 03/08/2017  . Birth control counseling 01/31/2017  . Dilated cardiomyopathy (HCC)   . Pulmonary hypertension (HCC)   . Tachycardia with heart rate 100-120 beats per minute   . Graves disease 02/11/2016  . Morbid obesity due to excess calories (HCC)     Current Outpatient Prescriptions:  .  Carboxymethylcellul-Glycerin (LUBRICATING EYE DROPS OP), Place 1 drop into the right eye 3 (three) times daily as needed (dry eyes)., Disp: , Rfl:  .  cholestyramine (QUESTRAN) 4 g packet, Take 1 packet (4 g total) by mouth 2 (two) times daily., Disp: 60 each, Rfl: 12 .  citalopram (CELEXA) 10 MG tablet, Take 1 tablet (10 mg total) by mouth daily., Disp: 30 tablet, Rfl: 3 .  furosemide (LASIX) 40 MG tablet, Take 1 tablet (40 mg total) by mouth daily. May take an additional tablet for fluid and weight gain., Disp: 60 tablet, Rfl: 1 .  labetalol (NORMODYNE) 300 MG tablet, Take 2 tablets (600 mg total) by mouth 2 (two) times daily., Disp: 120 tablet, Rfl: 3 .  methimazole (TAPAZOLE) 10 MG tablet, Take 40 mg by mouth 2 (two) times daily., Disp: , Rfl:  .  predniSONE (DELTASONE) 10 MG tablet, Take 1 tablet (10 mg total) by mouth daily with breakfast., Disp: , Rfl:  .  triamcinolone ointment (KENALOG) 0.5 %, Apply 1 application topically 2 (two) times daily., Disp: 30 g, Rfl: 0 Allergies  Allergen Reactions  . Iodine Hives  . Shellfish Allergy Hives  . Tape Rash     Social History   Social History  . Marital status: Single    Spouse name: N/A  . Number of children: N/A  . Years of education: N/A   Occupational  History  . Not on file.   Social History Main Topics  . Smoking status: Never Smoker  . Smokeless tobacco: Never Used  . Alcohol use No  . Drug use: No  . Sexual activity: Not Currently     Comment: approx 13 - [redacted] wks gestation   Other Topics Concern  . Not on file   Social History Narrative  . No narrative on file    Physical Exam  Pulmonary/Chest: No respiratory distress. She has no wheezes. She has no rales.  Abdominal: She exhibits no distension. There is no rebound and no guarding.  Musculoskeletal: She exhibits no edema.  Skin: Skin is warm and dry. She is not diaphoretic.        Future Appointments Date Time Provider Department Center  08/10/2017 3:30 PM [facility] PA/NP [facility] None    ATF pt CAO x4 sitting on the chair and working on her laptop. Pt stated that she has been sob on and off lately and she has to take an extra lasix sometimes.  I gave her the rx for lasix today and showed her the new prescription label.  jazmaine stated that pt needed to make an appointment for lab work. Pt has an appointment today @ 145. Pt stated that she has taken all of her meds and hasn't missed any. Pt has no complaints  today.   BP 122/72 (BP Location: Right Arm, Patient Position: Sitting)   Pulse 78   Resp 16   Wt 250 lb (113.4 kg)   LMP 01/31/2017 (Exact Date)   SpO2 98%   BMI 40.35 kg/m   Weight yesterday-didn't weigh Last visit weight-249 (7/19)    Onyx Schirmer, EMT Paramedic 08/02/2017    ACTION: Home visit completed

## 2017-08-10 ENCOUNTER — Other Ambulatory Visit (HOSPITAL_COMMUNITY): Payer: Self-pay

## 2017-08-10 ENCOUNTER — Ambulatory Visit (HOSPITAL_COMMUNITY)
Admission: RE | Admit: 2017-08-10 | Discharge: 2017-08-10 | Disposition: A | Discharge status: Home / Self Care | Payer: Medicaid Other | Source: Ambulatory Visit | Attending: Cardiology | Admitting: Cardiology

## 2017-08-10 VITALS — BP 112/61 | HR 87 | Wt 259.0 lb

## 2017-08-10 DIAGNOSIS — Z8249 Family history of ischemic heart disease and other diseases of the circulatory system: Secondary | ICD-10-CM | POA: Diagnosis not present

## 2017-08-10 DIAGNOSIS — F4321 Adjustment disorder with depressed mood: Secondary | ICD-10-CM | POA: Insufficient documentation

## 2017-08-10 DIAGNOSIS — E870 Hyperosmolality and hypernatremia: Secondary | ICD-10-CM | POA: Insufficient documentation

## 2017-08-10 DIAGNOSIS — I5022 Chronic systolic (congestive) heart failure: Secondary | ICD-10-CM

## 2017-08-10 DIAGNOSIS — Z8701 Personal history of pneumonia (recurrent): Secondary | ICD-10-CM | POA: Insufficient documentation

## 2017-08-10 DIAGNOSIS — R51 Headache: Secondary | ICD-10-CM | POA: Diagnosis not present

## 2017-08-10 DIAGNOSIS — F329 Major depressive disorder, single episode, unspecified: Secondary | ICD-10-CM

## 2017-08-10 DIAGNOSIS — I272 Pulmonary hypertension, unspecified: Secondary | ICD-10-CM | POA: Diagnosis not present

## 2017-08-10 DIAGNOSIS — I252 Old myocardial infarction: Secondary | ICD-10-CM | POA: Insufficient documentation

## 2017-08-10 DIAGNOSIS — Z6841 Body Mass Index (BMI) 40.0 and over, adult: Secondary | ICD-10-CM | POA: Insufficient documentation

## 2017-08-10 DIAGNOSIS — I10 Essential (primary) hypertension: Secondary | ICD-10-CM

## 2017-08-10 DIAGNOSIS — E669 Obesity, unspecified: Secondary | ICD-10-CM | POA: Insufficient documentation

## 2017-08-10 DIAGNOSIS — F419 Anxiety disorder, unspecified: Secondary | ICD-10-CM | POA: Diagnosis not present

## 2017-08-10 DIAGNOSIS — I5032 Chronic diastolic (congestive) heart failure: Secondary | ICD-10-CM | POA: Insufficient documentation

## 2017-08-10 DIAGNOSIS — E119 Type 2 diabetes mellitus without complications: Secondary | ICD-10-CM | POA: Diagnosis not present

## 2017-08-10 DIAGNOSIS — I110 Hypertensive heart disease with heart failure: Secondary | ICD-10-CM | POA: Insufficient documentation

## 2017-08-10 DIAGNOSIS — E0500 Thyrotoxicosis with diffuse goiter without thyrotoxic crisis or storm: Secondary | ICD-10-CM | POA: Diagnosis not present

## 2017-08-10 LAB — BASIC METABOLIC PANEL
Anion gap: 7 (ref 5–15)
BUN: 7 mg/dL (ref 6–20)
CALCIUM: 9 mg/dL (ref 8.9–10.3)
CHLORIDE: 105 mmol/L (ref 101–111)
CO2: 25 mmol/L (ref 22–32)
CREATININE: 0.75 mg/dL (ref 0.44–1.00)
GFR calc non Af Amer: >60 mL/min (ref >60)
Glucose, Bld: 85 mg/dL (ref 65–99)
Potassium: 3.8 mmol/L (ref 3.5–5.1)
SODIUM: 137 mmol/L (ref 135–145)

## 2017-08-10 MED ORDER — FUROSEMIDE 40 MG PO TABS: 40.0000 mg | ORAL_TABLET | Freq: Two times a day (BID) | ORAL | 6 refills | Status: DC

## 2017-08-10 NOTE — Progress Notes (Signed)
Paramedicine Encounter   Patient ID: Tara [name] , female,   DOB: Jun 05, 1991,26 y.o.,  MRN: [id number]   Met patient in clinic today with provider Jonni Sanger.   Jonni Sanger talked with pt about her diet and not to drink to much fluid.  Pt stated that she feels hot all of the time and she is thirsty all of the time.  Still working about 5 days a week at [employer].  She is beginning to feel sob after walking up the steps.  Pt stated that here depression has gotten better, she still "have moments when she doesn't want to be bothered". She was worried about "thryoid storm" but its been difficult for her to schedule doc appointment.     Pt is taking 40 BID and Jonni Sanger advised her to continue the same.  Take an extra lasix today and Thursday 2 am 1 pm. **increased lasix to 63m BID  Time spent with patient 30 mins  Maddoxx Burkitt, EMT-Paramedic 08/10/2017   ACTION: Next visit planned for next friday

## 2017-08-10 NOTE — Progress Notes (Signed)
Paramedicine Encounter    Patient ID: Tara [name], female    DOB: [birth year], 26 y.o.   MRN: [id number]    Patient Care Team: Garth Bigness, MD as PCP - General (Family Medicine)  Patient Active Problem List   Diagnosis Date Noted  . Skin lesion of right leg 07/22/2017  . Depression 06/23/2017  . Thyroid disease   . Acute on chronic diastolic congestive heart failure (HCC) 03/08/2017  . Birth control counseling 01/31/2017  . Dilated cardiomyopathy (HCC)   . Pulmonary hypertension (HCC)   . Tachycardia with heart rate 100-120 beats per minute   . Graves disease 02/11/2016  . Morbid obesity due to excess calories (HCC)     Current Outpatient Prescriptions:  .  Carboxymethylcellul-Glycerin (LUBRICATING EYE DROPS OP), Place 1 drop into the right eye 3 (three) times daily as needed (dry eyes)., Disp: , Rfl:  .  cholestyramine (QUESTRAN) 4 g packet, Take 1 packet (4 g total) by mouth 2 (two) times daily., Disp: 60 each, Rfl: 12 .  citalopram (CELEXA) 10 MG tablet, Take 1 tablet (10 mg total) by mouth daily., Disp: 30 tablet, Rfl: 3 .  furosemide (LASIX) 40 MG tablet, Take 1 tablet (40 mg total) by mouth daily. May take an additional tablet for fluid and weight gain., Disp: 60 tablet, Rfl: 1 .  labetalol (NORMODYNE) 300 MG tablet, Take 2 tablets (600 mg total) by mouth 2 (two) times daily., Disp: 120 tablet, Rfl: 3 .  methimazole (TAPAZOLE) 10 MG tablet, Take 40 mg by mouth 2 (two) times daily., Disp: , Rfl:  .  predniSONE (DELTASONE) 10 MG tablet, Take 1 tablet (10 mg total) by mouth daily with breakfast., Disp: , Rfl:  .  triamcinolone ointment (KENALOG) 0.5 %, Apply 1 application topically 2 (two) times daily., Disp: 30 g, Rfl: 0 Allergies  Allergen Reactions  . Iodine Hives  . Shellfish Allergy Hives  . Tape Rash     Social History   Social History  . Marital status: Single    Spouse name: N/A  . Number of children: N/A  . Years of education: N/A   Occupational  History  . Not on file.   Social History Main Topics  . Smoking status: Never Smoker  . Smokeless tobacco: Never Used  . Alcohol use No  . Drug use: No  . Sexual activity: Not Currently     Comment: approx 13 - [redacted] wks gestation   Other Topics Concern  . Not on file   Social History Narrative  . No narrative on file    Physical Exam     AC TION: Home visit completed

## 2017-08-10 NOTE — Patient Instructions (Addendum)
Follow up with Dr. [name] with endocrinology. Address: [address] [place], [place] [address] Phone: [phone]  INCREASE Lasix the next two days as follows: 80 mg (2 tabs) in ama and 40 mg (1 tab) in pm. Then continue taking lasix every day after as follows: 40 mg tablet twice daily. Take extra as needed for weight gain/swelling.  Routine lab work today. Will notify you of abnormal results, otherwise no news is good news!  Follow up 4-6 weeks with Amy Clegg NP-C. Take all medication as prescribed the day of your appointment. Bring all medications with you to your appointment.  Do the following things EVERYDAY: 1) Weigh yourself in the morning before breakfast. Write it down and keep it in a log. 2) Take your medicines as prescribed 3) Eat low salt foods-Limit salt (sodium) to 2000 mg per day.  4) Stay as active as you can everyday 5) Limit all fluids for the day to less than 2 liters

## 2017-08-10 NOTE — Progress Notes (Signed)
CSW met with patient in the clinic. Patient reports improved mood but physically still not feeling her best. She reports weight gain and feeling SOB when she goes upstairs. Patient shared difficulty obtaining an appointment with endocrinologist and feels some of her issues are related to her thyroid. CSW suggested patient walk across the street after clinic and obtain an appointment with the endocrinologist office. Patient appeared in good spirits today and states she is trying to improve her health and "do the right thing". Patient later returned call with an appointment in the Endocrinologist for 09/15/17 @ 8am. CSW encouraged patient to follow up and return call with needs. [name], [place], [place]

## 2017-08-10 NOTE — Progress Notes (Signed)
Advanced Heart Failure Clinic Note   PCP: None  Primary Cardiologist: Dr [name]  HPI: Tara Taborn Powellis a 26 y.o.femalewith history of graves diease, HTN, s/p C-section 07/2016 complicated by post-partum HCAP, pregnancy complicated by pre-eclampsia.   Treated for CAP in ED 10/24/16 and sent home. Returned to ED and admitted 10/25/16 with acute respiratory failure with hypoxia. Intubated early morning 10/27/16 with pulmonary edema and anxiety. Later extubated to room air. Hospital course complicated by hypernatremia. Diuresed ~30 pounds. Discharge weight 241 pounds.   Admitted 2/8 -> 01/28/17 with SVT. Thought likely secondary to her poorly controlled Grave's disease. Had been out of her thyroid and BP medications for 2-3 weeks.  Admitted 6/19-6/21/18 with SOB, hyperthyroidism (had not been taking methimazole). Hospital course complicated as she was pregnant and required therapeutic abortion. She was transferred to another facility for TA. Also had right heart cath done. Results below.   She returns today for regular follow up. At last visit recommended she start celexa, she states it gave her a headache, so she stopped. Worried about her diet but is unwilling to consider her regular [employer] meals as part of the problem. Does not monitor her fluid intake. Drinks 2 x 32 oz water at work, plus many more fluid at home. Working at [employer] 5 days a week. Doesn't have much energy to do much else. Denies orthopnea or PND. SOB with stairs or an incline. Feels like her depression has gotten some better, but still has "her moments".  Right heart cath 06/13/17 at [facility] Hemodynamics: BP: 116/79, mean 98 RA: A- wave 20, V-wave 19, mean 17 RV: 60/19, RVEDP 26 PA: 49/31, mean 39 Wedge: A-wave 27, V-wave 28, mean 26 Oximetry: Pulmonary artery saturation 71.20, Aortic saturation 96% Cardiac Output via the Fick 8.40 L per minute with cardiac index of 3.68 L per minute per square meter PVR:  1.74WU  Pharmacology: - Versed 3 mg - Fentanyl 125 mcg  Impressions: 1.Biventricular elevation of intracardiac filling pressures. 2.Borderline criteria for high output heart failure, reassuring from a pulmonary hypertension perspective with normal pulmonary vascular resistance.  SH: Lives with boyfriend and infant son.  Does not drink or smoke    Review of systems complete and found to be negative unless listed in HPI.    SH:  Social History   Social History  . Marital status: Single    Spouse name: N/A  . Number of children: N/A  . Years of education: N/A   Occupational History  . Not on file.   Social History Main Topics  . Smoking status: Never Smoker  . Smokeless tobacco: Never Used  . Alcohol use No  . Drug use: No  . Sexual activity: Not Currently     Comment: approx 13 - [redacted] wks gestation   Other Topics Concern  . Not on file   Social History Narrative  . No narrative on file    FH:  Family History  Problem Relation Age of Onset  . Hypertension Mother     Past Medical History:  Diagnosis Date  . Anemia   . Anxiety   . CAP (community acquired pneumonia) 02/13/2016  . CHF (congestive heart failure) (HCC)   . Chronic bronchitis (HCC)   . Diabetes mellitus without complication (HCC)   . Dyspnea    with exertion  . Graves disease   . Graves disease   . Headache   . Hypertension   . Myocardial infarction (HCC) 2013  . Obesity   . Pneumonia "  several times"  . Seasonal allergies     Current Outpatient Prescriptions  Medication Sig Dispense Refill  . Carboxymethylcellul-Glycerin (LUBRICATING EYE DROPS OP) Place 1 drop into the right eye 3 (three) times daily as needed (dry eyes).    . cholestyramine (QUESTRAN) 4 g packet Take 1 packet (4 g total) by mouth 2 (two) times daily. 60 each 12  . citalopram (CELEXA) 10 MG tablet Take 1 tablet (10 mg total) by mouth daily. 30 tablet 3  . furosemide (LASIX) 40 MG tablet Take 1 tablet (40 mg total) by  mouth daily. May take an additional tablet for fluid and weight gain. 60 tablet 1  . labetalol (NORMODYNE) 300 MG tablet Take 2 tablets (600 mg total) by mouth 2 (two) times daily. 120 tablet 3  . methimazole (TAPAZOLE) 10 MG tablet Take 40 mg by mouth 2 (two) times daily.    . predniSONE (DELTASONE) 10 MG tablet Take 1 tablet (10 mg total) by mouth daily with breakfast.    . triamcinolone ointment (KENALOG) 0.5 % Apply 1 application topically 2 (two) times daily. 30 g 0   No current facility-administered medications for this encounter.     Vitals:   08/10/17 1509  BP: 112/61  Pulse: 87  SpO2: 97%  Weight: 259 lb (117.5 kg)   Wt Readings from Last 3 Encounters:  08/10/17 259 lb (117.5 kg)  08/02/17 250 lb (113.4 kg)  07/22/17 243 lb (110.2 kg)    PHYSICAL EXAM:  General: Fatigued appearing female.  HEENT: Severe R exopthalmos Neck: Supple. JVP difficult due to body habitus. At least 8-9 cm. Carotids 2+ bilat; no bruits. No thyromegaly or nodule noted. Cor: PMI nondisplaced. RRR, No M/G/R noted Lungs: CTAB, normal effort. Abdomen: Soft, non-tender, non-distended, no HSM. No bruits or masses. +BS  Extremities: No cyanosis, clubbing, or rash. 1+ peripheral edema.   Neuro: Alert & orientedx3, cranial nerves grossly intact. moves all 4 extremities w/o difficulty. Affect flat.  ASSESSMENT & PLAN  1. Chronic  diastolic CHF: Echo 05/2017, EF 65-68%, mild aortic regurgitation, PA pressure 66 mm Hg.  - NYHA III, limited by symptoms from hyperthyroidism - Volume status elevated on exam, but weight gain out of proportion.   - Continue lasix 40 BID chronically, but take extra 40 mg the next 2 mornings. BMET today - Had long talk about dietary and fluid restrictions. She is drinking close to 4 L daily from her description.  - Reinforced dietary restrictions and fluid restrictions.   2. Graves disease: - Seen by Dr. [name] while hospitalized.  -  Per his consult note: After pregnancy,  consider definitive therapy for hyperthyroidism (either thyroid surgery or radioactive iodine therapy for hyperthyroidism).   After pregnancy, start selenium 100 micrograms by mouth twice a day for 6 months to address thyroid eye disease. After pregnancy, consider adding lithium 600 mg per day by mouth as an adjunct to help her prepare for definitive therapy for hyperthyroidism.   If radioactive iodine therapy is obtained, then recommend two months of prednisone to reduce the risk of worsening thyroid orbitopathy - She has not been able to see Dr. [name] since her Medicaid has been approved. She is going to contact his office again today, we will contact if she is unable to reach.   3. Situational depression - She refused celexa. States it caused headaches. Encouraged close PCP follow up. She denies any thoughts of self harm or desire to hurt others.   4. HTN - Continue labetalol  to 600 mg BID. Controlled on current medications. Needs thyroid treated definitively.   Labs today. RTC 4-6 weeks.   Addendum: Pt was able to walk across street to Dr. [name] office and make appointment.   Graciella Freer, PA-C  3:20 PM   Greater than 50% of the 25 minute visit was spent in counseling/coordination of care regarding disease state education, sliding scale diuretics, salt/fluid restriction, and medication reconciliation.

## 2017-09-05 MED FILL — FUROSEMIDE 40 MG TABLET: 40 | 30 days supply | Qty: 60 | Fill #1

## 2017-09-07 ENCOUNTER — Inpatient Hospital Stay (HOSPITAL_COMMUNITY): Admission: RE | Admit: 2017-09-07 | Payer: Medicaid Other | Source: Ambulatory Visit

## 2017-09-12 ENCOUNTER — Inpatient Hospital Stay (HOSPITAL_COMMUNITY): Admission: RE | Admit: 2017-09-12 | Payer: Medicaid Other | Source: Ambulatory Visit

## 2017-09-21 ENCOUNTER — Telehealth: Payer: Self-pay | Admitting: Licensed Clinical Social Worker

## 2017-09-21 NOTE — Telephone Encounter (Signed)
CSW left message for patient to return call regarding rescheduled appointment. Message left. [name], LCSW, CCSW-MCS [phone]

## 2017-09-27 ENCOUNTER — Telehealth: Payer: Self-pay | Admitting: Licensed Clinical Social Worker

## 2017-09-27 ENCOUNTER — Ambulatory Visit (HOSPITAL_COMMUNITY)
Admission: RE | Admit: 2017-09-27 | Discharge: 2017-09-27 | Disposition: A | Discharge status: Home / Self Care | Payer: Medicaid Other | Source: Ambulatory Visit | Attending: Internal Medicine | Admitting: Internal Medicine

## 2017-09-27 ENCOUNTER — Encounter (HOSPITAL_COMMUNITY): Payer: Self-pay

## 2017-09-27 VITALS — BP 132/82 | HR 75 | Wt 274.2 lb

## 2017-09-27 DIAGNOSIS — I10 Essential (primary) hypertension: Secondary | ICD-10-CM | POA: Diagnosis not present

## 2017-09-27 DIAGNOSIS — Z8701 Personal history of pneumonia (recurrent): Secondary | ICD-10-CM | POA: Insufficient documentation

## 2017-09-27 DIAGNOSIS — F4321 Adjustment disorder with depressed mood: Secondary | ICD-10-CM | POA: Insufficient documentation

## 2017-09-27 DIAGNOSIS — E669 Obesity, unspecified: Secondary | ICD-10-CM | POA: Insufficient documentation

## 2017-09-27 DIAGNOSIS — E0500 Thyrotoxicosis with diffuse goiter without thyrotoxic crisis or storm: Secondary | ICD-10-CM | POA: Diagnosis not present

## 2017-09-27 DIAGNOSIS — Z8249 Family history of ischemic heart disease and other diseases of the circulatory system: Secondary | ICD-10-CM | POA: Diagnosis not present

## 2017-09-27 DIAGNOSIS — E119 Type 2 diabetes mellitus without complications: Secondary | ICD-10-CM | POA: Insufficient documentation

## 2017-09-27 DIAGNOSIS — I110 Hypertensive heart disease with heart failure: Secondary | ICD-10-CM | POA: Insufficient documentation

## 2017-09-27 DIAGNOSIS — I252 Old myocardial infarction: Secondary | ICD-10-CM | POA: Diagnosis not present

## 2017-09-27 DIAGNOSIS — I5032 Chronic diastolic (congestive) heart failure: Secondary | ICD-10-CM | POA: Diagnosis present

## 2017-09-27 DIAGNOSIS — I420 Dilated cardiomyopathy: Secondary | ICD-10-CM | POA: Diagnosis not present

## 2017-09-27 DIAGNOSIS — I351 Nonrheumatic aortic (valve) insufficiency: Secondary | ICD-10-CM | POA: Diagnosis not present

## 2017-09-27 DIAGNOSIS — F419 Anxiety disorder, unspecified: Secondary | ICD-10-CM | POA: Diagnosis not present

## 2017-09-27 DIAGNOSIS — Z6841 Body Mass Index (BMI) 40.0 and over, adult: Secondary | ICD-10-CM | POA: Insufficient documentation

## 2017-09-27 DIAGNOSIS — Z79899 Other long term (current) drug therapy: Secondary | ICD-10-CM | POA: Diagnosis not present

## 2017-09-27 LAB — BASIC METABOLIC PANEL
ANION GAP: 8 (ref 5–15)
BUN: 9 mg/dL (ref 6–20)
CALCIUM: 9 mg/dL (ref 8.9–10.3)
CHLORIDE: 101 mmol/L (ref 101–111)
CO2: 24 mmol/L (ref 22–32)
CREATININE: 0.65 mg/dL (ref 0.44–1.00)
GFR calc non Af Amer: >60 mL/min (ref >60)
Glucose, Bld: 86 mg/dL (ref 65–99)
Potassium: 3.6 mmol/L (ref 3.5–5.1)
SODIUM: 133 mmol/L — AB (ref 135–145)

## 2017-09-27 NOTE — Addendum Note (Signed)
Encounter addended by: Marcy Siren, LCSW on: 09/27/2017  4:25 PM<BR>    Actions taken: Sign clinical note

## 2017-09-27 NOTE — Patient Instructions (Signed)
.  Labs drawn today (if we do not call you, then your lab work was stable)   Your physician recommends that you schedule a follow-up appointment in: 3 months in APP clinic

## 2017-09-27 NOTE — Telephone Encounter (Signed)
CSW contacted patient to remind of appointment in the clinic today and confirm transportation needs. Patient states she needs taxi and did not go to work today because she is feeling sick, CSW arranged transportation and will follow up with patient this afternoon. [name], LCSW, CCSW-MCS [phone]

## 2017-09-27 NOTE — Progress Notes (Signed)
Paramedicine CSW Initial Assessment  Tara [name] is enrolled in the [employer] Program through [facility].  The patient presents today in association with an Advanced Heart Failure Clinic Appointment.  Patient seen today by CSW for follow up/assistance on Financial difficulties Other: Supportive Counseling.   Social Determinants impacting successful heart failure regimen:  Housing: patient lives with Tara [name] (boyfriend) and her 3 minor childrem. Food: Do you have enough food? Yes  Do you know and understand healthy eating and how that affects your heart failure diagnosis? Yes  Do you follow a low salt diet?  Yes  Utilities: Do you have gas and/or electricity on in your home? Yes  Income: What is your source of income? Employed at [employer]: medicaid Transportation: Do you have transportation to your medical appointments?No, patient often relies on cab service offered by clinic. Patient informed of Medicaid transport for medical appointments.    Daily Health Needs: Do you have a scale and weigh each day?  Yes  Are you able to adhere to your medication regimen? Yes  Do you ever take medications differently than prescribed? No  Do you know the zones of Heart Failure?  Yes  Do you know how to contact the HF Clinic appropriately with worsening symptoms or weight increases?  Yes   Do you have an Advanced Directive?  No  If not, would you like to complete?  Do you have any identified obstacles / challenges for adherence to current treatment plan?No  Patient feels supported by HF staff and calls when needed. Patient has multiple social determinants she faces although has a positive attitude towards life and determined to "make something of myself".    CSW assisted patient with [employer], Problem-solving teaching/coping strategies and Supportive Counseling with the goal to Increase healthy adjustment to current life circumstances  CSW provided information food resources and contacted [employer] worker Tara [name] [phone]  Fax #[phone]) to inquire about brief time patient was not on full medicaid and was hospitalized. Patient was placed on family planning medicaid at that time (case# [id number]) Patient unable to make follow up appointment with Dr. [name] (CSW spoke with Angie @[phone]) due to large payment owed. CSW will follow up with possible deductible medicaid application. Patient will bring all unpaid medical bills back to CSW to send over to worker.  [facility] will continue to coordinate and monitor patient's treatment plan. CSW continues to be available for identified needs. Tara Beech, LCSW, CCSW-MCS [phone]

## 2017-09-27 NOTE — Progress Notes (Signed)
Advanced Heart Failure Clinic Note   PCP: None  Primary Cardiologist: Dr Haroldine Laws  HPI: Fatimata Talsma Powellis a 26 y.o.femalewith history of graves diease, HTN, s/p C-section 02/5572 complicated by post-partum HCAP, pregnancy complicated by pre-eclampsia.   Treated for CAP in ED 10/24/16 and sent home. Returned to ED and admitted 10/25/16 with acute respiratory failure with hypoxia. Intubated early morning 10/27/16 with pulmonary edema and anxiety. Later extubated to room air. Hospital course complicated by hypernatremia. Diuresed ~30 pounds. Discharge weight 241 pounds.   Admitted 2/8 -> 01/28/17 with SVT. Thought likely secondary to her poorly controlled Grave's disease. Had been out of her thyroid and BP medications for 2-3 weeks.  Admitted 6/19-6/21/18 with SOB, hyperthyroidism (had not been taking methimazole). Hospital course complicated as she was pregnant and required therapeutic abortion. She was transferred to another facility for TA. Also had right heart cath done. Results below.   Returns today for HF follow up. Overall feeling ok, weights at home are trending up. 260-264 pounds. Taking her HF medications. She did not go to her endocrinologist appointment as it was early in the am and she cannot get her children on the bus early in the am as she relies on the bus for transportation.  Denies SOB, palpitations. She is working at [employer] not SOB with working. Wants to know if she can start exercising.     Right heart cath 06/13/17 at [facility] Hemodynamics: BP: 116/79, mean 98 RA: A- wave 20, V-wave 19, mean 17 RV: 60/19, RVEDP 26 PA: 49/31, mean 39 Wedge: A-wave 27, V-wave 28, mean 26 Oximetry: Pulmonary artery saturation 71.20, Aortic saturation 96% Cardiac Output via the Fick 8.40 L per minute with cardiac index of 3.68 L per minute per square meter PVR: 1.74WU  Pharmacology: - Versed 3 mg - Fentanyl 125 mcg  Impressions: 1.Biventricular elevation of intracardiac  filling pressures. 2.Borderline criteria for high output heart failure, reassuring from a pulmonary hypertension perspective with normal pulmonary vascular resistance.  SH: Lives with boyfriend and infant son.  Does not drink or smoke    Review of systems complete and found to be negative unless listed in HPI.    SH:  Social History   Social History  . Marital status: Single    Spouse name: N/A  . Number of children: N/A  . Years of education: N/A   Occupational History  . Not on file.   Social History Main Topics  . Smoking status: Never Smoker  . Smokeless tobacco: Never Used  . Alcohol use No  . Drug use: No  . Sexual activity: Not Currently     Comment: approx 13 - [redacted] wks gestation   Other Topics Concern  . Not on file   Social History Narrative  . No narrative on file    FH:  Family History  Problem Relation Age of Onset  . Hypertension Mother     Past Medical History:  Diagnosis Date  . Anemia   . Anxiety   . CAP (community acquired pneumonia) 02/13/2016  . CHF (congestive heart failure) ([place])   . Chronic bronchitis ([place])   . Diabetes mellitus without complication ([place])   . Dyspnea    with exertion  . Graves disease   . Graves disease   . Headache   . Hypertension   . Myocardial infarction ([place]) 2013  . Obesity   . Pneumonia "several times"  . Seasonal allergies     Current Outpatient Prescriptions  Medication Sig Dispense Refill  .  Carboxymethylcellul-Glycerin (LUBRICATING EYE DROPS OP) Place 1 drop into the right eye 3 (three) times daily as needed (dry eyes).    . furosemide (LASIX) 40 MG tablet Take 1 tablet (40 mg total) by mouth 2 (two) times daily. May take an additional tablet for fluid and weight gain. 90 tablet 6  . labetalol (NORMODYNE) 300 MG tablet Take 2 tablets (600 mg total) by mouth 2 (two) times daily. 120 tablet 3  . methimazole (TAPAZOLE) 10 MG tablet Take 40 mg by mouth 2 (two) times daily.    Marland Kitchen triamcinolone ointment  (KENALOG) 0.5 % Apply 1 application topically 2 (two) times daily. 30 g 0  . cholestyramine (QUESTRAN) 4 g packet Take 1 packet (4 g total) by mouth 2 (two) times daily. (Patient not taking: Reported on 09/27/2017) 60 each 12  . citalopram (CELEXA) 10 MG tablet Take 1 tablet (10 mg total) by mouth daily. (Patient not taking: Reported on 09/27/2017) 30 tablet 3  . predniSONE (DELTASONE) 10 MG tablet Take 1 tablet (10 mg total) by mouth daily with breakfast. (Patient not taking: Reported on 09/27/2017)     No current facility-administered medications for this encounter.     Vitals:   09/27/17 1400  BP: 132/82  Pulse: 75  SpO2: 97%  Weight: 274 lb 3.2 oz (124.4 kg)   Wt Readings from Last 3 Encounters:  09/27/17 274 lb 3.2 oz (124.4 kg)  08/10/17 259 lb (117.5 kg)  08/02/17 250 lb (113.4 kg)    PHYSICAL EXAM: General: Well appearing. No resp difficulty. HEENT: Normal Neck: Supple. JVP 5-6. Carotids 2+ bilat; no bruits. No thyromegaly or nodule noted. Cor: PMI nondisplaced. RRR, No M/G/R noted Lungs: CTAB, normal effort. Abdomen: Soft, non-tender, non-distended, no HSM. No bruits or masses. +BS  Extremities: No cyanosis, clubbing, rash, R and LLE no edema.  Neuro: Alert & orientedx3, cranial nerves grossly intact. moves all 4 extremities w/o difficulty. Affect pleasant   ASSESSMENT & PLAN  1. Chronic  diastolic CHF: Echo 02/2950, EF 65-70%, mild aortic regurgitation, PA pressure 66 mm Hg.  - NYHA II - Volume stable on exam - Continue 40 mg Lasix BID.  - CSW met with patient today. Can start exercising, will help her get set up at the [facility].  - Reinforced fluid restrictions and dietary restrictions.   2. Graves disease: - Needs to see an Endocrinologist.  - I have called Dr. Cindra Eves office, she now has a bad debt balance there and they will not see her until this resolves. If this resolves, they will be able to offer her an appt. Later in the day.  - She continues on methimazole.  Refuses to take Questran.   3. Situational depression - Denies SI/HI - Stopped taking celexa as it caused headaches.  - Encouraged follow up with PCP.   4. HTN - Well controlled on current regimen.    BMET today. Follow up in 3 months.   Arbutus Leas, NP  2:06 PM

## 2017-10-03 ENCOUNTER — Encounter: Payer: Self-pay | Admitting: Licensed Clinical Social Worker

## 2017-10-03 NOTE — Progress Notes (Signed)
CSW met with patient in the clinic to discuss transportation issues. Patient states she had a job interview this morning and she got the job. Patient is elated that she is making progress and hopeful to be independent one day. Patient states struggles with transportation as medicaid does not cover these types of needs. CSW assisted with some bus passes to get back and forth from her new job this week. Patient grateful for the support and will follow up with CSW about new job. CSW will continue to follow through the [employer] for care coordination and any other needs as they arise. CSW will discuss need for continued paramedicine at care coordination meeting tomorrow. [name], [place], [place]

## 2017-10-12 ENCOUNTER — Other Ambulatory Visit (HOSPITAL_COMMUNITY): Payer: Self-pay | Admitting: Student

## 2017-10-12 ENCOUNTER — Other Ambulatory Visit (HOSPITAL_COMMUNITY): Payer: Self-pay | Admitting: Surgery

## 2017-10-12 MED ORDER — FUROSEMIDE 40 MG PO TABS
40.0000 mg | ORAL_TABLET | Freq: Two times a day (BID) | ORAL | 6 refills | Status: DC
Start: 2017-10-12 — End: 2018-04-26

## 2017-10-12 MED FILL — FUROSEMIDE 40 MG TABLET: 40 | 30 days supply | Qty: 90 | Fill #0 | Status: TO

## 2017-10-12 NOTE — Telephone Encounter (Signed)
Ms Demastus contacted [name] LCSW regarding Lasix refill.  It looks as though the prescription has already been sent to the [facility]. She says that they do not have therefore I will send again.

## 2017-10-13 ENCOUNTER — Telehealth: Payer: Self-pay | Admitting: Family Medicine

## 2017-10-14 NOTE — Telephone Encounter (Signed)
LM on identified VM for patient to call back and schedule an appointment with PCP. Tara [name],CMA

## 2017-10-14 NOTE — Telephone Encounter (Signed)
Please call patient. She should be seeing endocrinology for this. I am not prescribing her thyroid medicines anymore. I will refuse this rx. She also should come in and see me when she can to update me on how endo care is going and discuss birth control.

## 2017-10-21 ENCOUNTER — Other Ambulatory Visit (HOSPITAL_COMMUNITY): Payer: Self-pay

## 2017-10-21 MED ORDER — METHIMAZOLE 10 MG PO TABS: 40.0000 mg | ORAL_TABLET | Freq: Two times a day (BID) | ORAL | 0 refills | Status: DC

## 2017-10-21 NOTE — Telephone Encounter (Signed)
appt made for 11/29 which was the first available.  Pt states she isnt seeing the endocrinologist because she has a bill there.  Her heart is racing because she is not taking this medication. She would like to get a refill until her appt on the 29th. Please call and let her know what is decided.

## 2017-10-21 NOTE — Telephone Encounter (Signed)
Patient needs to be seen today or go to ED if we are unavailable if she is having racing heartbeat. She has CHF as well as the thyroid issues, I can't tell what is causing her rapid heartbeat via phone. Please call her.

## 2017-10-22 ENCOUNTER — Emergency Department (HOSPITAL_COMMUNITY)
Admission: EM | Admit: 2017-10-22 | Discharge: 2017-10-22 | Disposition: A | Discharge status: Home / Self Care | Payer: Medicaid Other | Attending: Emergency Medicine | Admitting: Emergency Medicine

## 2017-10-22 ENCOUNTER — Encounter (HOSPITAL_COMMUNITY): Payer: Self-pay

## 2017-10-22 DIAGNOSIS — K029 Dental caries, unspecified: Secondary | ICD-10-CM | POA: Diagnosis not present

## 2017-10-22 DIAGNOSIS — K0889 Other specified disorders of teeth and supporting structures: Secondary | ICD-10-CM | POA: Insufficient documentation

## 2017-10-22 DIAGNOSIS — E119 Type 2 diabetes mellitus without complications: Secondary | ICD-10-CM | POA: Insufficient documentation

## 2017-10-22 DIAGNOSIS — I110 Hypertensive heart disease with heart failure: Secondary | ICD-10-CM | POA: Insufficient documentation

## 2017-10-22 DIAGNOSIS — J029 Acute pharyngitis, unspecified: Secondary | ICD-10-CM | POA: Diagnosis not present

## 2017-10-22 DIAGNOSIS — Z79899 Other long term (current) drug therapy: Secondary | ICD-10-CM | POA: Insufficient documentation

## 2017-10-22 DIAGNOSIS — I5032 Chronic diastolic (congestive) heart failure: Secondary | ICD-10-CM | POA: Diagnosis not present

## 2017-10-22 DIAGNOSIS — I252 Old myocardial infarction: Secondary | ICD-10-CM | POA: Insufficient documentation

## 2017-10-22 LAB — RAPID STREP SCREEN (MED CTR MEBANE ONLY): STREPTOCOCCUS, GROUP A SCREEN (DIRECT): NEGATIVE

## 2017-10-22 MED ORDER — PENICILLIN V POTASSIUM 500 MG PO TABS: 500.0000 mg | ORAL_TABLET | Freq: Four times a day (QID) | ORAL | 0 refills | Status: AC

## 2017-10-22 NOTE — Discharge Instructions (Signed)
Salt water throat gargles and mouth rinses.  Tylenol or Motrin for pain.  You can try some topical Orajel to the tooth.  Take penicillin as prescribed until all gone for the infection.  Follow-up with a dentist.  Return if worsening symptoms

## 2017-10-22 NOTE — ED Triage Notes (Signed)
Filling came out of left upper molar, c/o pain.  C/o sore throat x 3 days.

## 2017-10-22 NOTE — ED Provider Notes (Signed)
[facility] EMERGENCY DEPARTMENT Provider Note   CSN: [id number] Arrival date & time: 10/22/17  1421     History   Chief Complaint Chief Complaint  Tara [name] presents with  . Sore Throat  . Dental Pain    HPI Tara [name] is a 26 y.o. female.  HPI Tara [name] is a 26 y.o. female with hx of CHF, DM, Graves disease, HTN, MI, Anemia, presents to ED with complaint of sore throat and dental pain. Pt states her sore throat has been there for about 3 days.  Denies associated nasal congestion, fever, chills, cough.  States she has been taking Alka-Seltzer cold and flu medication with no relief.  No one around her sick with the same.  No difficulty swallowing.  No change in voice.  Tara [name] is also complaining of a left upper tooth pain which started about the same time.  She states she thinks that the tooth chipped.  She states she has been taken Tylenol and Motrin with no relief.  She made an appointment with a dentist but is not for another 2 weeks.  She states chewing and cold ear is making her pain worse.  Nothing makes it better.  No facial swelling.  Past Medical History:  Diagnosis Date  . Anemia   . Anxiety   . CAP (community acquired pneumonia) 02/13/2016  . CHF (congestive heart failure) (H[facility])   . Chronic bronchitis ([facility])   . Diabetes mellitus without complication (H[facility])   . Dyspnea    with exertion  . Graves disease   . Graves disease   . Headache   . Hypertension   . Myocardial infarction ([facility]) 2013  . Obesity   . Pneumonia "several times"  . Seasonal allergies     Tara [name] Active Problem List   Diagnosis Date Noted  . Skin lesion of right leg 07/22/2017  . Depression 06/23/2017  . Thyroid disease   . Chronic diastolic heart failure (HCC) 03/08/2017  . Birth control counseling 01/31/2017  . Dilated cardiomyopathy (HCC)   . Pulmonary hypertension (HCC)   . Tachycardia with heart rate 100-120 beats per minute   . Graves disease 02/11/2016    . HTN (hypertension) 02/11/2016  . Morbid obesity due to excess calories [facility])     Past Surgical History:  Procedure Laterality Date  . CESAREAN SECTION  2008, 2013   x 2  . CESAREAN SECTION N/A 07/21/2016   Procedure: CESAREAN SECTION;  Surgeon: Lesly Dukes, MD;  Location: [facility];  Service: Obstetrics;  Laterality: N/A;  . EYE SURGERY Bilateral 2014   "for Graves Disease; eyelid retraction on left; decompression on the right""    OB History    Gravida Para Term Preterm AB Living   6 5 4 1  0 5   SAB TAB Ectopic Multiple Live Births   0 0 0 0 4       Home Medications    Prior to Admission medications   Medication Sig Start Date End Date Taking? Authorizing Provider  Carboxymethylcellul-Glycerin (LUBRICATING EYE DROPS OP) Place 1 drop into the right eye 3 (three) times daily as needed (dry eyes).    [provider]  cholestyramine (QUESTRAN) 4 g packet Take 1 packet (4 g total) by mouth 2 (two) times daily. Tara [name] not taking: Reported on 09/27/2017 06/09/17   Casey Burkitt, MD  citalopram (CELEXA) 10 MG tablet Take 1 tablet (10 mg total) by mouth daily. Tara [name] not taking: Reported on 09/27/2017  06/23/17 06/23/18  Bensimhon, Bevelyn Bucklesaniel R, MD  furosemide (LASIX) 40 MG tablet TAKE 1 TABLET BY MOUTH DAILY. MAY TAKE AN ADDITIONAL TABLET FOR FLUID AND WEIGHT GAIN. 10/13/17   Bensimhon, Bevelyn Bucklesaniel R, MD  furosemide (LASIX) 40 MG tablet Take 1 tablet (40 mg total) by mouth 2 (two) times daily. May take an additional tablet for fluid and weight gain. 10/12/17   Bensimhon, Bevelyn Bucklesaniel R, MD  labetalol (NORMODYNE) 300 MG tablet Take 2 tablets (600 mg total) by mouth 2 (two) times daily. 06/23/17   Little IshikawaSmith, Erin E, NP  methimazole (TAPAZOLE) 10 MG tablet Take 4 tablets (40 mg total) by mouth 2 (two) times daily. 10/21/17   Bensimhon, Bevelyn Bucklesaniel R, MD  predniSONE (DELTASONE) 10 MG tablet Take 1 tablet (10 mg total) by mouth daily with breakfast. Tara [name] not taking: Reported on  09/27/2017 06/10/17   Casey BurkittFitzgerald, Hillary Moen, MD  triamcinolone ointment (KENALOG) 0.5 % Apply 1 application topically 2 (two) times daily. 07/22/17   Garth Bignessimberlake, Kathryn, MD    Family History Family History  Problem Relation Age of Onset  . Hypertension Mother     Social History Social History  Substance Use Topics  . Smoking status: Never Smoker  . Smokeless tobacco: Never Used  . Alcohol use No     Allergies   Iodine; Shellfish allergy; and Tape   Review of Systems Review of Systems  Constitutional: Negative for chills and fever.  HENT: Positive for dental problem and sore throat. Negative for congestion, mouth sores, sinus pressure, trouble swallowing and voice change.   Respiratory: Negative for cough, chest tightness and shortness of breath.   Cardiovascular: Negative for chest pain, palpitations and leg swelling.  Gastrointestinal: Negative for abdominal pain, diarrhea, nausea and vomiting.  Genitourinary: Negative for dysuria, flank pain and pelvic pain.  Musculoskeletal: Negative for arthralgias, myalgias, neck pain and neck stiffness.  Skin: Negative for rash.  Neurological: Negative for dizziness, weakness and headaches.  All other systems reviewed and are negative.    Physical Exam Updated Vital Signs BP (!) 151/81 (BP Location: Left Arm)   Pulse 88   Temp 98.2 F (36.8 C) (Oral)   Resp 18   LMP 10/13/2017   SpO2 96%   Breastfeeding? No   Physical Exam  Constitutional: She is oriented to person, place, and time. She appears well-developed and well-nourished. No distress.  HENT:  Head: Normocephalic and atraumatic.  Right Ear: Tympanic membrane, external ear and ear canal normal.  Left Ear: Tympanic membrane, external ear and ear canal normal.  Nose: Nose normal.  Mouth/Throat: Uvula is midline, oropharynx is clear and moist and mucous membranes are normal. No oropharyngeal exudate, posterior oropharyngeal edema, posterior oropharyngeal erythema or  tonsillar abscesses.  Left upper third molar decayed to the gumline.  There is mild surrounding gum erythema and swelling.  No trismus.  No swelling under the tongue.  Eyes: Conjunctivae are normal.  Neck: Normal range of motion. Neck supple.  Cardiovascular: Normal rate, regular rhythm and normal heart sounds.   Pulmonary/Chest: Effort normal and breath sounds normal. No respiratory distress. She has no wheezes. She has no rales.  Abdominal: Soft. Bowel sounds are normal. She exhibits no distension. There is no tenderness. There is no rebound.  Musculoskeletal: She exhibits no edema.  Neurological: She is alert and oriented to person, place, and time.  Skin: Skin is warm and dry.  Psychiatric: She has a normal mood and affect. Her behavior is normal.  Nursing note and vitals reviewed.  ED Treatments / Results  Labs (all labs ordered are listed, but only abnormal results are displayed) Labs Reviewed  RAPID STREP SCREEN (NOT AT [facility])  CULTURE, GROUP A STREP [facility])    EKG  EKG Interpretation None       Radiology No results found.  Procedures Procedures (including critical care time)  Medications Ordered in ED Medications - No data to display   Initial Impression / Assessment and Plan / ED Course  I have reviewed the triage vital signs and the nursing notes.  Pertinent labs & imaging results that were available during my care of the Tara [name] were reviewed by me and considered in my medical decision making (see chart for details).     Pt with dental pain and sore throat. Her throat is normal in appearance. I wonder if her sore throat is a referred pain from her decayed left upper 3rd molar which appears to possibly be infected. No difficulty swallowing. No distress. Plan to start on antibiotics. Tylenol/motrin for pain. Follow up with a dentist.   Vitals:   10/22/17 1451  BP: (!) 151/81  Pulse: 88  Resp: 18  Temp: 98.2 F (36.8 C)  TempSrc: Oral  SpO2: 96%      Final Clinical Impressions(s) / ED Diagnoses   Final diagnoses:  Pain due to dental caries  Pharyngitis, unspecified etiology    New Prescriptions New Prescriptions   PENICILLIN V POTASSIUM (VEETID) 500 MG TABLET    Take 1 tablet (500 mg total) by mouth 4 (four) times daily.     Jaynie Crumble, PA-C 10/22/17 1728    Shaune Pollack, MD 10/23/17 [phone]

## 2017-10-24 LAB — CULTURE, GROUP A STREP (THRC)

## 2017-10-25 NOTE — Telephone Encounter (Addendum)
Looking in review and it appears pt was seen in the ED on 10/22/2017. Tara [name], Florina Glas D, [place]

## 2017-11-17 ENCOUNTER — Ambulatory Visit: Payer: Medicaid Other | Admitting: Family Medicine

## 2018-02-07 ENCOUNTER — Telehealth (HOSPITAL_COMMUNITY): Payer: Self-pay | Admitting: Surgery

## 2018-02-07 NOTE — Telephone Encounter (Signed)
Patient will be discharged from the HF Community Paramedicine Program due to successful completion at this time.  She has been reminded to contact the HF Clinic with any issues or concerns related to her HF going forward.

## 2018-04-25 ENCOUNTER — Encounter (HOSPITAL_COMMUNITY): Payer: Medicaid Other

## 2018-04-26 ENCOUNTER — Ambulatory Visit (HOSPITAL_COMMUNITY)
Admission: RE | Admit: 2018-04-26 | Discharge: 2018-04-26 | Disposition: A | Discharge status: Home / Self Care | Payer: Medicaid Other | Source: Ambulatory Visit | Attending: Cardiology | Admitting: Cardiology

## 2018-04-26 ENCOUNTER — Encounter (HOSPITAL_COMMUNITY): Payer: Self-pay

## 2018-04-26 VITALS — BP 138/88 | HR 104 | Wt 279.8 lb

## 2018-04-26 DIAGNOSIS — I10 Essential (primary) hypertension: Secondary | ICD-10-CM | POA: Diagnosis not present

## 2018-04-26 DIAGNOSIS — I5032 Chronic diastolic (congestive) heart failure: Secondary | ICD-10-CM | POA: Diagnosis not present

## 2018-04-26 DIAGNOSIS — F4321 Adjustment disorder with depressed mood: Secondary | ICD-10-CM | POA: Insufficient documentation

## 2018-04-26 DIAGNOSIS — I272 Pulmonary hypertension, unspecified: Secondary | ICD-10-CM

## 2018-04-26 DIAGNOSIS — E0500 Thyrotoxicosis with diffuse goiter without thyrotoxic crisis or storm: Secondary | ICD-10-CM | POA: Diagnosis not present

## 2018-04-26 DIAGNOSIS — I110 Hypertensive heart disease with heart failure: Secondary | ICD-10-CM | POA: Insufficient documentation

## 2018-04-26 DIAGNOSIS — I252 Old myocardial infarction: Secondary | ICD-10-CM | POA: Diagnosis not present

## 2018-04-26 DIAGNOSIS — Z79899 Other long term (current) drug therapy: Secondary | ICD-10-CM | POA: Diagnosis not present

## 2018-04-26 DIAGNOSIS — J42 Unspecified chronic bronchitis: Secondary | ICD-10-CM | POA: Insufficient documentation

## 2018-04-26 DIAGNOSIS — E119 Type 2 diabetes mellitus without complications: Secondary | ICD-10-CM | POA: Insufficient documentation

## 2018-04-26 NOTE — Progress Notes (Signed)
Heart Failure Clinic Social Work Assessment  Tara [name]  presents today in association with an Advanced Heart Failure Clinic Appointment.  Patient seen today by CSW for follow up/assistance on Financial difficulties Health problems and/or  Finances   Social Determinants impacting successful heart failure regimen:  Housing: patient lives with boyfriend and 3 minor children. Food: Do you have enough food? Yes  Do you know and understand healthy eating and how that affects your heart failure diagnosis? Yes  Do you follow a low salt diet?  Try too Utilities: Do you have gas and/or electricity on in your home? Yes  Income: What is your source of income? Employed at [employer]: [employer]: Do you have transportation to your medical appointments? No  Clinic provided taxi ride today.    Daily Health Needs: Do you have a scale and weigh each day?  Yes  Do you have your medications? Yes  Are you able to adhere to your medication regimen? Yes   Do you ever take medications differently than prescribed? No  Do you know the zones of Heart Failure?  Yes  Do you know how to contact the HF Clinic appropriately with worsening symptoms or weight increases?  Yes    Do you have any identified obstacles / challenges for adherence to current treatment plan? Yes  Patient reports recently moved back to [place] from [place] where she has family. She states difficulty with obtaining medical care in [place] and the father of her children resides here in [place]. She states she has medicaid through [facility] and anticipates she will have coverage for a year. Patient very much wants to get thyroid issue resolved and will follow up with [place] MD. She spoke at length about financial needs. She received her first check although spent most of it on [employer] rides to work. CSW discussed financial planning and other transportation options to save monies. CSW provided bus passes and a [employer] gift  card to get needed toiletries for work.   Patient appears to be overwhelmed with financial issues which is impacting her health and follow up.    CSW assisted patient with Supportive Counseling and financial resources with the goal to Increase motivation to adhere to plan of care   Heart Failure Clinic Social Worker will continue to coordinate and monitor patient's treatment plan as needed.  CSW continues to be available for identified needs. [name], LCSW, CCSW-MCS [phone]

## 2018-04-26 NOTE — Patient Instructions (Addendum)
Will schedule you for sleep study at [facility]. Address: [address] [place] [place], [place] [address] Phone: [phone]  Follow up with Dr. [name] regarding hyperthyroidism. [address] [phone] They will call to schedule.  Follow up 3 months with Amy Clegg NP-C.  _____________________________________________________________ [place] Code: 1500  Take all medication as prescribed the day of your appointment. Bring all medications with you to your appointment.  Do the following things EVERYDAY: 1) Weigh yourself in the morning before breakfast. Write it down and keep it in a log. 2) Take your medicines as prescribed 3) Eat low salt foods-Limit salt (sodium) to 2000 mg per day.  4) Stay as active as you can everyday 5) Limit all fluids for the day to less than 2 liters

## 2018-04-26 NOTE — Progress Notes (Signed)
Advanced Heart Failure Clinic Note   PCP: None  Primary Cardiologist: Dr [name] Endocrinologist; Dr  HPI: Tara [name] a 27 y.o.femalewith history of graves diease, HTN, s/p C-section 07/2016 complicated by post-partum HCAP, pregnancy complicated by pre-eclampsia.   Treated for CAP in ED 10/24/16 and sent home. Returned to ED and admitted 10/25/16 with acute respiratory failure with hypoxia. Intubated early morning 10/27/16 with pulmonary edema and anxiety. Later extubated to room air. Hospital course complicated by hypernatremia. Diuresed ~30 pounds. Discharge weight 241 pounds.   Admitted 2/8 -> 01/28/17 with SVT. Thought likely secondary to her poorly controlled Grave's disease. Had been out of her thyroid and BP medications for 2-3 weeks.  Admitted 6/19-6/21/18 with SOB, hyperthyroidism (had not been taking methimazole). Hospital course complicated as she was pregnant and required therapeutic abortion. She was transferred to another facility for TA. Also had right heart cath done. Results below.   Today she returns an acute HF follow due to cough. She has not had thyroid follow up due to insurance. She Feeling ok but having frequent cough. She does admit to dyspnea with steps. Denies SOB/PND/Orthopnea. No fever or chills. Weight at home 279 which is  stable. Taking all medications. Working full time on nights as a Therapist, art at [employer].  Says she exhausted after working at night. Lives with boy friend and 3 children.   ECHO 05/2017 EF 65-70% Peak PA pressure 66 mm hg   Right heart cath 06/13/17 at [facility] Hemodynamics: BP: 116/79, mean 98 RA: A- wave 20, V-wave 19, mean 17 RV: 60/19, RVEDP 26 PA: 49/31, mean 39 Wedge: A-wave 27, V-wave 28, mean 26 Oximetry: Pulmonary artery saturation 71.20, Aortic saturation 96% Cardiac Output via the Fick 8.40 L per minute with cardiac index of 3.68 L per minute per square meter PVR: 1.74WU  Pharmacology: - Versed 3 mg -  Fentanyl 125 mcg  Impressions: 1.Biventricular elevation of intracardiac filling pressures. 2.Borderline criteria for high output heart failure, reassuring from a pulmonary hypertension perspective with normal pulmonary vascular resistance.  SH: Lives with boyfriend and infant son.  Does not drink or smoke    Review of systems complete and found to be negative unless listed in HPI.    SH:  Social History   Socioeconomic History  . Marital status: Single    Spouse name: Not on file  . Number of children: Not on file  . Years of education: Not on file  . Highest education level: Not on file  Occupational History  . Not on file  Social Needs  . Financial resource strain: Not on file  . Food insecurity:    Worry: Not on file    Inability: Not on file  . Transportation needs:    Medical: Not on file    Non-medical: Not on file  Tobacco Use  . Smoking status: Never Smoker  . Smokeless tobacco: Never Used  Substance and Sexual Activity  . Alcohol use: No  . Drug use: No  . Sexual activity: Not Currently    Comment: approx 13 - [redacted] wks gestation  Lifestyle  . Physical activity:    Days per week: Not on file    Minutes per session: Not on file  . Stress: Not on file  Relationships  . Social connections:    Talks on phone: Not on file    Gets together: Not on file    Attends religious service: Not on file    Active member of club  or organization: Not on file    Attends meetings of clubs or organizations: Not on file    Relationship status: Not on file  . Intimate partner violence:    Fear of current or ex partner: Not on file    Emotionally abused: Not on file    Physically abused: Not on file    Forced sexual activity: Not on file  Other Topics Concern  . Not on file  Social History Narrative  . Not on file    FH:  Family History  Problem Relation Age of Onset  . Hypertension Mother     Past Medical History:  Diagnosis Date  . Anemia   . Anxiety   .  CAP (community acquired pneumonia) 02/13/2016  . CHF (congestive heart failure) (HCC)   . Chronic bronchitis (HCC)   . Diabetes mellitus without complication (HCC)   . Dyspnea    with exertion  . Graves disease   . Graves disease   . Headache   . Hypertension   . Myocardial infarction (HCC) 2013  . Obesity   . Pneumonia "several times"  . Seasonal allergies     Current Outpatient Medications  Medication Sig Dispense Refill  . Carboxymethylcellul-Glycerin (LUBRICATING EYE DROPS OP) Place 1 drop into the right eye 3 (three) times daily as needed (dry eyes).    . furosemide (LASIX) 40 MG tablet Take 80 mg by mouth daily.    Marland Kitchen labetalol (NORMODYNE) 300 MG tablet Take 2 tablets (600 mg total) by mouth 2 (two) times daily. 120 tablet 3  . methimazole (TAPAZOLE) 10 MG tablet Take 4 tablets (40 mg total) by mouth 2 (two) times daily. 30 tablet 0  . triamcinolone ointment (KENALOG) 0.5 % Apply 1 application topically 2 (two) times daily. 30 g 0   No current facility-administered medications for this encounter.     Vitals:   04/26/18 1035  BP: 138/88  Pulse: (!) 104  SpO2: 93%  Weight: 279 lb 12.8 oz (126.9 kg)   Wt Readings from Last 3 Encounters:  04/26/18 279 lb 12.8 oz (126.9 kg)  09/27/17 274 lb 3.2 oz (124.4 kg)  08/10/17 259 lb (117.5 kg)    PHYSICAL EXAM: General:  NAD. No resp difficulty HEENT: normal except exophthalmos Neck: supple. no JVD. Carotids 2+ bilat; no bruits. No lymphadenopathy or thryomegaly appreciated. Cor: PMI nondisplaced. Regular rate & rhythm. No rubs, gallops or murmurs. Lungs: clear Abdomen: obese, soft, nontender, nondistended. No hepatosplenomegaly. No bruits or masses. Good bowel sounds. Extremities: no cyanosis, clubbing, rash, edema Neuro: alert & orientedx3, cranial nerves grossly intact. moves all 4 extremities w/o difficulty. Affect pleasant  ASSESSMENT & PLAN  1. Chronic  diastolic CHF: Echo 05/2017, EF 40-98%, mild aortic regurgitation,  PA pressure 66 mm Hg.  - She now has insurance will refer for sleep study.   -NYHA II. Volume stable.  - Continue lasix 40 mg twice a day  2. Graves disease: - Refer back to Dr [name].  In the past she was seen at [facility] and [facility] but due to insurance she never completed treatment. Needs to complete thyroid treatment.   She continues on methimazole.  3. Situational depression - Refer to PCP  4. HTN Continue current regimen.   5. Cough  Does not appear toe be related to heart failure.  Today she was referred to HFSW for coping strategies and assistance with transportation to various appointments,. Refer to Endocrinology today and set up for sleep study.  Follow up  in 3 months.    Tonye Becket, NP  11:05 AM

## 2018-05-08 ENCOUNTER — Encounter: Payer: Self-pay | Admitting: Internal Medicine

## 2018-05-08 ENCOUNTER — Ambulatory Visit (INDEPENDENT_AMBULATORY_CARE_PROVIDER_SITE_OTHER): Payer: Medicaid Other | Admitting: Internal Medicine

## 2018-05-08 ENCOUNTER — Other Ambulatory Visit: Payer: Self-pay

## 2018-05-08 VITALS — BP 128/72 | HR 97 | Temp 98.0°F | Ht 66.0 in | Wt 277.0 lb

## 2018-05-08 DIAGNOSIS — R300 Dysuria: Secondary | ICD-10-CM | POA: Diagnosis not present

## 2018-05-08 LAB — POCT URINALYSIS DIP (MANUAL ENTRY)
Bilirubin, UA: NEGATIVE
Blood, UA: NEGATIVE
GLUCOSE UA: NEGATIVE mg/dL
Ketones, POC UA: NEGATIVE mg/dL
Leukocytes, UA: NEGATIVE
NITRITE UA: NEGATIVE
Protein Ur, POC: NEGATIVE mg/dL
Spec Grav, UA: <=1.005 — AB (ref 1.010–1.025)
UROBILINOGEN UA: 0.2 U/dL
pH, UA: 7 (ref 5.0–8.0)

## 2018-05-08 NOTE — Patient Instructions (Signed)
It was nice meeting you today Milany!  Your urine showed no signs of infection, so it is unlikely that you have a urinary tract infection.   If your symptoms become bothersome, or if you start having pain or burning when you urinate, please let us know.   If you have any questions or concerns, please feel free to call the clinic.   Be well,  Dr. Natale Milch

## 2018-05-08 NOTE — Progress Notes (Signed)
   Subjective:   Patient: Tara [name]       Birthdate: March 13, 1991       MRN: [id number]      HPI  Tara [name] is a 27 y.o. female presenting for same day appointment for "weird feeling when urinating."   "Weird feeling when urinating" Present x2w. Does not occur with every episode of urination. No pain, just "weird feeling." No abd pain, N/V, fevers. No change in urinary frequency. No vaginal discharge. No change in coloration of urine. This has never happened before. Sx are not particularly bothersome, she just wanted to make sure she does not have  UTI.   Smoking status reviewed. Patient is never smoker.   Review of Systems See HPI.     Objective:  Physical Exam  Constitutional: She is oriented to person, place, and time. She appears well-developed and well-nourished. No distress.  HENT:  Head: Normocephalic and atraumatic.  Pulmonary/Chest: Effort normal. No respiratory distress.  Neurological: She is alert and oriented to person, place, and time.  Psychiatric: She has a normal mood and affect. Her behavior is normal.      Assessment & Plan:  Abnormal urinary sensation UA with no signs of infection. Sx description not consistent with UTI as well. Patient says sx are not bothersome, and she simply wanted to ensure that she does not have a UTI. Does not want to proceed with further work-up today. Encouraged patient to call back if sx become bothersome or if she develops dysuria. Could consider pyridium at that time.   Tarri Abernethy, MD, MPH PGY-3 [facility] Pager [phone]

## 2018-05-17 ENCOUNTER — Telehealth: Payer: Self-pay | Admitting: Family Medicine

## 2018-05-17 NOTE — Telephone Encounter (Signed)
Pt would like a copy of her last TB test left up front for her to pick. Please call pt when this has been left up front for her.

## 2018-05-17 NOTE — Telephone Encounter (Signed)
Pt will come in for an updated TB test on Friday. Fleeger, Maryjo Rochester, CMA

## 2018-05-19 ENCOUNTER — Ambulatory Visit: Payer: Medicaid Other

## 2018-05-25 ENCOUNTER — Ambulatory Visit (HOSPITAL_BASED_OUTPATIENT_CLINIC_OR_DEPARTMENT_OTHER): Payer: Medicaid Other | Attending: Adult Health

## 2018-07-18 ENCOUNTER — Other Ambulatory Visit: Payer: Self-pay

## 2018-07-18 ENCOUNTER — Encounter (HOSPITAL_COMMUNITY): Payer: Self-pay | Admitting: Emergency Medicine

## 2018-07-18 ENCOUNTER — Observation Stay (HOSPITAL_COMMUNITY)
Admission: EM | Admit: 2018-07-18 | Discharge: 2018-07-18 | DRG: 292 | Disposition: A | Discharge status: Home / Self Care | Payer: Medicaid Other | Attending: Family Medicine | Admitting: Family Medicine

## 2018-07-18 ENCOUNTER — Emergency Department (HOSPITAL_COMMUNITY): Payer: Medicaid Other

## 2018-07-18 ENCOUNTER — Inpatient Hospital Stay (HOSPITAL_COMMUNITY): Payer: Medicaid Other

## 2018-07-18 DIAGNOSIS — E119 Type 2 diabetes mellitus without complications: Secondary | ICD-10-CM | POA: Diagnosis not present

## 2018-07-18 DIAGNOSIS — I509 Heart failure, unspecified: Secondary | ICD-10-CM

## 2018-07-18 DIAGNOSIS — I5033 Acute on chronic diastolic (congestive) heart failure: Secondary | ICD-10-CM | POA: Diagnosis present

## 2018-07-18 DIAGNOSIS — R0902 Hypoxemia: Secondary | ICD-10-CM | POA: Diagnosis not present

## 2018-07-18 DIAGNOSIS — F419 Anxiety disorder, unspecified: Secondary | ICD-10-CM | POA: Diagnosis not present

## 2018-07-18 DIAGNOSIS — Z91013 Allergy to seafood: Secondary | ICD-10-CM | POA: Diagnosis not present

## 2018-07-18 DIAGNOSIS — I340 Nonrheumatic mitral (valve) insufficiency: Secondary | ICD-10-CM

## 2018-07-18 DIAGNOSIS — R0682 Tachypnea, not elsewhere classified: Secondary | ICD-10-CM | POA: Diagnosis present

## 2018-07-18 DIAGNOSIS — I252 Old myocardial infarction: Secondary | ICD-10-CM

## 2018-07-18 DIAGNOSIS — I110 Hypertensive heart disease with heart failure: Secondary | ICD-10-CM | POA: Diagnosis not present

## 2018-07-18 DIAGNOSIS — E0500 Thyrotoxicosis with diffuse goiter without thyrotoxic crisis or storm: Secondary | ICD-10-CM | POA: Diagnosis present

## 2018-07-18 DIAGNOSIS — I420 Dilated cardiomyopathy: Secondary | ICD-10-CM | POA: Diagnosis not present

## 2018-07-18 DIAGNOSIS — D72829 Elevated white blood cell count, unspecified: Secondary | ICD-10-CM | POA: Diagnosis not present

## 2018-07-18 DIAGNOSIS — Z79899 Other long term (current) drug therapy: Secondary | ICD-10-CM | POA: Diagnosis not present

## 2018-07-18 DIAGNOSIS — Z6841 Body Mass Index (BMI) 40.0 and over, adult: Secondary | ICD-10-CM | POA: Diagnosis not present

## 2018-07-18 DIAGNOSIS — Z91048 Other nonmedicinal substance allergy status: Secondary | ICD-10-CM | POA: Diagnosis not present

## 2018-07-18 DIAGNOSIS — I272 Pulmonary hypertension, unspecified: Secondary | ICD-10-CM | POA: Diagnosis not present

## 2018-07-18 DIAGNOSIS — E0590 Thyrotoxicosis, unspecified without thyrotoxic crisis or storm: Secondary | ICD-10-CM

## 2018-07-18 DIAGNOSIS — Z8249 Family history of ischemic heart disease and other diseases of the circulatory system: Secondary | ICD-10-CM | POA: Diagnosis not present

## 2018-07-18 DIAGNOSIS — Z9114 Patient's other noncompliance with medication regimen: Secondary | ICD-10-CM

## 2018-07-18 DIAGNOSIS — F329 Major depressive disorder, single episode, unspecified: Secondary | ICD-10-CM | POA: Diagnosis present

## 2018-07-18 DIAGNOSIS — J302 Other seasonal allergic rhinitis: Secondary | ICD-10-CM | POA: Diagnosis present

## 2018-07-18 DIAGNOSIS — Z91148 Patient's other noncompliance with medication regimen for other reason: Secondary | ICD-10-CM

## 2018-07-18 LAB — CBC WITH DIFFERENTIAL/PLATELET
ABS IMMATURE GRANULOCYTES: 0 10*3/uL (ref 0.0–0.1)
Basophils Absolute: 0 10*3/uL (ref 0.0–0.1)
Basophils Relative: 0 %
Eosinophils Absolute: 0.1 10*3/uL (ref 0.0–0.7)
Eosinophils Relative: 1 %
HCT: 37.6 % (ref 36.0–46.0)
HEMOGLOBIN: 11.2 g/dL — AB (ref 12.0–15.0)
IMMATURE GRANULOCYTES: 0 %
LYMPHS PCT: 23 %
Lymphs Abs: 2.7 10*3/uL (ref 0.7–4.0)
MCH: 21.9 pg — ABNORMAL LOW (ref 26.0–34.0)
MCHC: 29.8 g/dL — ABNORMAL LOW (ref 30.0–36.0)
MCV: 73.4 fL — ABNORMAL LOW (ref 78.0–100.0)
Monocytes Absolute: 1 10*3/uL (ref 0.1–1.0)
Monocytes Relative: 8 %
Neutro Abs: 7.8 10*3/uL — ABNORMAL HIGH (ref 1.7–7.7)
Neutrophils Relative %: 68 %
Platelets: 230 10*3/uL (ref 150–400)
RBC: 5.12 MIL/uL — AB (ref 3.87–5.11)
RDW: 14 % (ref 11.5–15.5)
WBC: 11.7 10*3/uL — AB (ref 4.0–10.5)

## 2018-07-18 LAB — COMPREHENSIVE METABOLIC PANEL
ALBUMIN: 3.1 g/dL — AB (ref 3.5–5.0)
ALK PHOS: 96 U/L (ref 38–126)
ALT: 20 U/L (ref 0–44)
ANION GAP: 14 (ref 5–15)
AST: 21 U/L (ref 15–41)
BUN: 8 mg/dL (ref 6–20)
CALCIUM: 8.7 mg/dL — AB (ref 8.9–10.3)
CO2: 23 mmol/L (ref 22–32)
Chloride: 101 mmol/L (ref 98–111)
Creatinine, Ser: 0.51 mg/dL (ref 0.44–1.00)
GFR calc Af Amer: >60 mL/min (ref >60)
GFR calc non Af Amer: >60 mL/min (ref >60)
GLUCOSE: 99 mg/dL (ref 70–99)
Potassium: 3 mmol/L — ABNORMAL LOW (ref 3.5–5.1)
SODIUM: 138 mmol/L (ref 135–145)
Total Bilirubin: 0.4 mg/dL (ref 0.3–1.2)
Total Protein: 6.9 g/dL (ref 6.5–8.1)

## 2018-07-18 LAB — LIPASE, BLOOD: Lipase: 26 U/L (ref 11–51)

## 2018-07-18 LAB — BASIC METABOLIC PANEL
ANION GAP: 9 (ref 5–15)
BUN: 7 mg/dL (ref 6–20)
CALCIUM: 8.6 mg/dL — AB (ref 8.9–10.3)
CO2: 25 mmol/L (ref 22–32)
Chloride: 105 mmol/L (ref 98–111)
Creatinine, Ser: 0.4 mg/dL — ABNORMAL LOW (ref 0.44–1.00)
GFR calc Af Amer: >60 mL/min (ref >60)
GLUCOSE: 94 mg/dL (ref 70–99)
Potassium: 3.2 mmol/L — ABNORMAL LOW (ref 3.5–5.1)
Sodium: 139 mmol/L (ref 135–145)

## 2018-07-18 LAB — ECHOCARDIOGRAM COMPLETE
HEIGHTINCHES: 66 in
WEIGHTICAEL: 4240 [oz_av]

## 2018-07-18 LAB — TROPONIN I

## 2018-07-18 LAB — I-STAT BETA HCG BLOOD, ED (MC, WL, AP ONLY)

## 2018-07-18 LAB — BRAIN NATRIURETIC PEPTIDE: B Natriuretic Peptide: 164.9 pg/mL — ABNORMAL HIGH (ref 0.0–100.0)

## 2018-07-18 LAB — TSH

## 2018-07-18 LAB — T4, FREE

## 2018-07-18 MED ORDER — METHIMAZOLE 10 MG PO TABS: 40.0000 mg | ORAL_TABLET | Freq: Two times a day (BID) | ORAL | 0 refills | Status: DC

## 2018-07-18 MED ORDER — POTASSIUM CHLORIDE ER 20 MEQ PO TBCR: 40.0000 meq | EXTENDED_RELEASE_TABLET | Freq: Every day | ORAL | 0 refills | Status: DC | PRN

## 2018-07-18 MED ORDER — POTASSIUM CHLORIDE CRYS ER 20 MEQ PO TBCR
40.0000 meq | EXTENDED_RELEASE_TABLET | Freq: Once | ORAL | Status: AC
  Administered 2018-07-18: 40 meq via ORAL
  Filled 2018-07-18: qty 2

## 2018-07-18 MED ORDER — METOLAZONE 2.5 MG PO TABS: 2.5000 mg | ORAL_TABLET | Freq: Every day | ORAL | 0 refills | Status: DC | PRN

## 2018-07-18 MED ORDER — FUROSEMIDE 10 MG/ML IJ SOLN
80.0000 mg | Freq: Once | INTRAMUSCULAR | Status: AC
  Administered 2018-07-18: 80 mg via INTRAVENOUS
  Filled 2018-07-18: qty 8

## 2018-07-18 MED ORDER — FUROSEMIDE 10 MG/ML IJ SOLN
INTRAMUSCULAR | Status: AC
  Filled 2018-07-18: qty 4

## 2018-07-18 MED ORDER — FUROSEMIDE 10 MG/ML IJ SOLN: 80.0000 mg | Freq: Two times a day (BID) | INTRAMUSCULAR | Status: DC

## 2018-07-18 MED ORDER — ASPIRIN 81 MG PO CHEW
324.0000 mg | CHEWABLE_TABLET | Freq: Once | ORAL | Status: AC
  Administered 2018-07-18: 324 mg via ORAL
  Filled 2018-07-18: qty 4

## 2018-07-18 MED ORDER — LABETALOL HCL 300 MG PO TABS: 600.0000 mg | ORAL_TABLET | Freq: Two times a day (BID) | ORAL | 0 refills | Status: DC

## 2018-07-18 MED ORDER — POTASSIUM CHLORIDE CRYS ER 20 MEQ PO TBCR
40.0000 meq | EXTENDED_RELEASE_TABLET | Freq: Once | ORAL | Status: AC
Start: 2018-07-18 — End: 2018-07-18
  Administered 2018-07-18: 40 meq via ORAL
  Filled 2018-07-18: qty 2

## 2018-07-18 MED ORDER — FUROSEMIDE 10 MG/ML IJ SOLN
40.0000 mg | Freq: Once | INTRAMUSCULAR | Status: DC
  Filled 2018-07-18: qty 4

## 2018-07-18 MED ORDER — FUROSEMIDE 10 MG/ML IJ SOLN
40.0000 mg | Freq: Once | INTRAMUSCULAR | Status: AC
  Administered 2018-07-18: 40 mg via INTRAVENOUS

## 2018-07-18 MED ORDER — POTASSIUM CHLORIDE CRYS ER 20 MEQ PO TBCR: 40.0000 meq | EXTENDED_RELEASE_TABLET | Freq: Once | ORAL | Status: DC

## 2018-07-18 MED ORDER — FUROSEMIDE 40 MG PO TABS: 40.0000 mg | ORAL_TABLET | Freq: Every day | ORAL | 0 refills | Status: DC

## 2018-07-18 NOTE — ED Notes (Signed)
ED Provider at bedside. 

## 2018-07-18 NOTE — ED Notes (Signed)
Echocardiogram at the bedside.  

## 2018-07-18 NOTE — Progress Notes (Addendum)
FPTS Interim Progress Note  S:Breathing this AM has improved.  Denies chest pain.  Denies orthopnea.  No edema.  O: BP (!) 103/51   Pulse 90   Temp 98.4 F (36.9 C) (Oral)   Resp (!) 30   Ht 5\' 6"  (1.676 m)   Wt 265 lb (120.2 kg)   LMP 07/03/2018   SpO2 95%   BMI 42.77 kg/m    Physical Exam: General: 27 y.o. y.o. female in NAD Cardio: irregular with murmur Lungs: CTAB, no wheezing, no rhonchi, no crackles Abdomen: Soft, non-tender to palpation, positive bowel sounds Skin: warm and dry Extremities: No edema   A/P: F/u pending labs F/u Echo Consult Heart failure  Unknown Jim, DO 07/18/2018, 7:49 AM PGY-1, [facility] pager [phone]

## 2018-07-18 NOTE — ED Notes (Signed)
Resident at bedside speaking with the patient. Pt has decided to stay and be admitted to the hospital.

## 2018-07-18 NOTE — Discharge Summary (Signed)
Family Medicine Teaching [facility] Discharge Summary  Patient name: Tara [name] Medical record number: [id number] Date of birth: 05-30-1991 Age: 27 y.o. Gender: female Date of Admission: 07/18/2018  Date of Discharge: 07/18/18 Admitting Physician: No admitting provider for patient encounter.  Primary Care Provider: Garth Bigness, MD Consultants: Heart Failure  Indication for Hospitalization: Dyspnea on Exertion  Discharge Diagnoses/Problem List:  Dyspnea on exertion Acute on Chronic HFpEF Grave's Disease Leukocytosis  Disposition: Home  Discharge Condition: stable  Discharge Exam:   General: 27 y.o. y.o. female in NAD Cardio: irregular with murmur Lungs: CTAB, no wheezing, no rhonchi, no crackles Abdomen: Soft, non-tender to palpation, positive bowel sounds Skin: warm and dry Extremities: No edema    Brief Hospital Course:  Tara [name] is a 27 yo female who presented to the ED with worsening shortness of breath.  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  She presented with a one day history of worsening shortness of breath on exertion.  Her chest x-ray was consistent with pulmonary edema and BNP was elevated at 164.  She was also tachypnic on presentation with respirations in the 30s.  She had reported compliance with her home Lasix 40mg  QD, but had taken an extra pill on Saturday and Sunday without improvement in diuresis.  Echo showed EF 60-65%, mild MVP, mild MR, mildly dilated RA, and PA peak pressure of . She was given a dose of IV Lasix and Heart failure was consulted as she had an appointment with them scheduled for the day of her presentation.  Heart failure recommended another dose of IV Lasix and stated that the patient was able to return home if she diuresed well, as her tachypnic had improved and patient was denying complaints.  She diuresed 1L of fluid following IV Lasix from Heart Failure team.  She continued  to deny shortness of breath and was no longer tachypnic and was satting appropriately on room air.  The patient was able to walk to the bathroom without desaturation.  She was stable at the time of discharge.  Heart Failure recommended that patient be given a prescription for metolazone 2.5mg  to use as needed when Lasix dose fails to diurese. She is also to take K-dur 40 mEq when she takes a dose of metolazone.  She will need follow up with Heart Failure team as an outpatient.  The patient also has a history of Grave's disease and is on Methimazole.  Her TSH was found to be <0.010 and Free T4 was >5.50. Patient stated that she had not been taking her methimazole because she didn't have insurance and couldn't afford it.  Patient now has insurance and agreed to take the methimazole.  She was educated on the signs and symptoms of Thyroid Storm and uncontrolled hyperthyroidism.  The patient was made an appointment for follow up in the [facility] on 8/2 and was encouraged to follow up with her endocrinologist, Dr. [name].   Patient also had an elevated WBC to 11.7  She had noted nausea, vomiting, and diarrhea this weekend that had since resolved.  Patient was afebrile throughout admission.  She will need a follow up CBC as outpatient, suspect leukocytosis was resolving from viral illness.  Issues for Follow Up:  1. Will need follow up with Endocrinology as patient's thyroid is uncontrolled. Will send home with methimazole Rx. 2. Will need follow up of fluid status.  Patient should be taking daily weights and metolazone 2.5mg  prn when Lasix fails. She  should be taking K-Dur 40 with each metolazone dose. 3. Will need follow up with Heart Failure Team. 4. Will need follow up BMP to monitor K and Cr following diuresis. 5. Will need repeat CBC to follow up leukocytosis.  Significant Procedures: None  Significant Labs and Imaging:  Recent Labs  Lab 07/18/18 0222  WBC 11.7*  HGB 11.2*  HCT 37.6  PLT 230   Recent  Labs  Lab 07/18/18 0222 07/18/18 1130  NA 138 139  K 3.0* 3.2*  CL 101 105  CO2 23 25  GLUCOSE 99 94  BUN 8 7  CREATININE 0.51 0.40*  CALCIUM 8.7* 8.6*  ALKPHOS 96  --   AST 21  --   ALT 20  --   ALBUMIN 3.1*  --     Dg Chest 2 View  Result Date: 07/18/2018 CLINICAL DATA:  Shortness of breath, generalized weakness for 2 days. History of pneumonia, bronchitis, CHF. EXAM: CHEST - 2 VIEW COMPARISON:  Chest radiograph June 07, 2017 FINDINGS: Cardiac silhouette is mildly enlarged and unchanged. Mediastinal silhouette is not suspicious. Mild interstitial prominence, patchy bibasilar airspace opacities. No pleural effusion. No pneumothorax. Soft tissue planes and included osseous structures are normal. IMPRESSION: 1. Stable cardiomegaly. 2. Interstitial prominence and patchy bibasilar airspace opacities seen with pulmonary edema or infection. Electronically Signed   By: Awilda Metro M.D.   On: 07/18/2018 02:14   Echo   - Left ventricle: The cavity size was normal. Wall thickness was   increased in a pattern of mild LVH. Systolic function was normal.   The estimated ejection fraction was in the range of 60% to 65%.   Wall motion was normal; there were no regional wall motion   abnormalities. - Mitral valve: Mild prolapse, involving the anterior leaflet.   There was mild regurgitation. - Right atrium: The atrium was mildly dilated. - Pulmonary arteries: Systolic pressure was moderately to severely   increased. PA peak pressure: 62 mm Hg (S).  Results/Tests Pending at Time of Discharge: None  Discharge Medications:  Allergies as of 07/18/2018      Reactions   Iodine Hives   Shellfish Allergy Hives   Tape Rash      Medication List    STOP taking these medications   triamcinolone ointment 0.5 % Commonly known as:  KENALOG     TAKE these medications   diphenhydramine-acetaminophen 25-500 MG Tabs tablet Commonly known as:  TYLENOL PM Take 2 tablets by mouth at bedtime as  needed (pain/sleep).   furosemide 40 MG tablet Commonly known as:  LASIX Take 1 tablet (40 mg total) by mouth daily.   labetalol 300 MG tablet Commonly known as:  NORMODYNE Take 2 tablets (600 mg total) by mouth 2 (two) times daily. What changed:  how much to take   methimazole 10 MG tablet Commonly known as:  TAPAZOLE Take 4 tablets (40 mg total) by mouth 2 (two) times daily.   metolazone 2.5 MG tablet Commonly known as:  ZAROXOLYN Take 1 tablet (2.5 mg total) by mouth daily as needed (for increased weight or swelling).   Potassium Chloride ER 20 MEQ Tbcr Take 40 mEq by mouth daily as needed (with every dose of metolazone taken).       Discharge Instructions: Please refer to Patient Instructions section of EMR for full details.  Patient was counseled important signs and symptoms that should prompt return to medical care, changes in medications, dietary instructions, activity restrictions, and follow up appointments.  Follow-Up Appointments: Follow-up Information    Marthenia Rolling, DO Follow up on 07/21/2018.   Specialty:  Family Medicine Why:  4:00pm Contact information: [address] [place] [address] [phone]        Talmage Coin, MD. Schedule an appointment as soon as possible for a visit in 1 week(s).   Specialty:  Endocrinology Contact information: [address] [place] [address] [phone]           Unknown Jim, DO 07/18/2018, 9:19 PM PGY-1, [facility]

## 2018-07-18 NOTE — ED Notes (Signed)
Pt ambulated slowly while checking pulse oximetry. Pt was standing and fluctuated between 87% and 90%. While ambulating the pt she stayed at 89% and 90%. Pt seemed to tolerate walking better than before.

## 2018-07-18 NOTE — ED Triage Notes (Signed)
Per EMS pt from home, A+Ox4 was walking and felt SOB, h/s of HF, also c/o generalized weakness and N/V/D for 2 days. 2L oxygen in route

## 2018-07-18 NOTE — Consult Note (Addendum)
Advanced Heart Failure Team Consult Note   Primary Physician: Garth Bigness, MD PCP-Cardiologist:  No primary care provider on file.  Reason for Consultation: A/C diastolic HF  HPI:    Tara [name] is seen today for evaluation of A/C diastolic HF at the request of Dr Jennette Kettle.   Ardeth Sportsman a 27 y.o.femalewith history of graves diease, HTN, chronic diastolic heart failure, depression, morbid obesity, and pregnancy complicated by pre-eclampsia.   She was last seen in HF clinic 04/26/18. She was doing okay at that time. She was complaining of a cough, but was not thought to be r/t fluid overload. She was approved for Medicaid, so was referred for sleep study and referred to endocrine for further management of Graves disease. Weight was 279 lbs.   Over the last two weeks, she has become progressively more SOB on exertion. Denies any edema or orthopnea. She has a productive cough with clear/white sputum. Denies fever or chills. Denies CP. She has dizziness on and off that is not associated with certain movements. Weights have trended down 10 lbs over the last couple months. She is not doing anything to try to lose weight. She had "a lot" of vomiting and diarrhea on Saturday and Sunday, which has now resolved. She limits her salt and cooks all her meals at home. She may have been drinking more of the last 2 weeks. She has not missed any medications. She did not follow up with endocrine or sleep study because she is going to lose Medicaid by the end of the month.   Pertinent admission labs include: K 3.0, creatinine 0.51, BNP 165 (previously 88-194), troponin <0.03, WBC 11.7, hemoglobin 11.2, HCG negative CXR: 1. Stable cardiomegaly. 2. Interstitial prominence and patchy bibasilar airspace opacities seen with pulmonary edema or infection.  She was given 40 mg IV lasix and feels much better now. She is lying flat without difficulty.   SH: Working as a Lawyer at [employer]. She  uses the bus for transportation. Does not drive. Lives at home with her children. Denies ETOH, tobacco, or drug use.   Cardiac Studies: Echo 06/08/18:  EF 65-70%, mild AI, RV mildly reduced, PA peak pressure 66 mmHg, trivial pericardial effusion  Right heart cath 06/13/17 at [facility] Hemodynamics: BP: 116/79, mean 98 RA: A- wave 20, V-wave 19, mean 17 RV: 60/19, RVEDP 26 PA: 49/31, mean 39 Wedge: A-wave 27, V-wave 28, mean 26 Oximetry: Pulmonary artery saturation 71.20, Aortic saturation 96% Cardiac Output via the Fick 8.40 L per minute with cardiac index of 3.68 L per minute per square meter PVR: 1.74WU  Impressions: 1.Biventricular elevation of intracardiac filling pressures. 2.Borderline criteria for high output heart failure, reassuring from a pulmonary hypertension perspective with normal pulmonary vascular resistance.   Review of Systems: [y] = yes, [ ]  = no   General: Weight gain [ ] ; Weight loss Cove.Etienne ]; Anorexia [ ] ; Fatigue Cove.Etienne ]; Fever [ ] ; Chills [ ] ; Weakness [ ]   Cardiac: Chest pain/pressure [ ] ; Resting SOB [ ] ; Exertional SOB Cove.Etienne ]; Orthopnea [ ] ; Pedal Edema [ ] ; Palpitations [ ] ; Syncope [ ] ; Presyncope [ ] ; Paroxysmal nocturnal dyspnea[ ]   Pulmonary: Cough Cove.Etienne ]; Wheezing[ ] ; Hemoptysis[ ] ; Sputum [ ] ; Snoring [ ]   GI: Vomiting[ y]; Dysphagia[ ] ; Melena[ ] ; Hematochezia [ ] ; Heartburn[ ] ; Abdominal pain [ ] ; Constipation [ ] ; Diarrhea Cove.Etienne ]; BRBPR [ ]   GU: Hematuria[ ] ; Dysuria [ ] ; Nocturia[ ]   Vascular: Pain in  legs with walking [ ] ; Pain in feet with lying flat [ ] ; Non-healing sores [ ] ; Stroke [ ] ; TIA [ ] ; Slurred speech [ ] ;  Neuro: Headaches[ ] ; Vertigo[ ] ; Seizures[ ] ; Paresthesias[ ] ;Blurred vision [ ] ; Diplopia [ ] ; Vision changes [ ]   Ortho/Skin: Arthritis [ ] ; Joint pain [ ] ; Muscle pain [ ] ; Joint swelling [ ] ; Back Pain [ ] ; Rash [ ]   Psych: Depression[ ] ; Anxiety[ ]   Heme: Bleeding problems [ ] ; Clotting disorders [ ] ; Anemia [ ]   Endocrine: Diabetes [  ]; Thyroid dysfunction[y ]  Home Medications Prior to Admission medications   Medication Sig Start Date End Date Taking? Authorizing Provider  diphenhydramine-acetaminophen (TYLENOL PM) 25-500 MG TABS tablet Take 2 tablets by mouth at bedtime as needed (pain/sleep).   Yes [provider]  furosemide (LASIX) 40 MG tablet Take 40 mg by mouth daily.    Yes [provider]  labetalol (NORMODYNE) 300 MG tablet Take 2 tablets (600 mg total) by mouth 2 (two) times daily. Patient taking differently: Take 300 mg by mouth 2 (two) times daily.  06/23/17  Yes Little Ishikawa, NP  methimazole (TAPAZOLE) 10 MG tablet Take 4 tablets (40 mg total) by mouth 2 (two) times daily. 10/21/17  Yes Adrieana Fennelly, Bevelyn Buckles, MD  triamcinolone ointment (KENALOG) 0.5 % Apply 1 application topically 2 (two) times daily. Patient not taking: Reported on 07/18/2018 07/22/17   Garth Bigness, MD    Past Medical History: Past Medical History:  Diagnosis Date  . Anemia   . Anxiety   . CAP (community acquired pneumonia) 02/13/2016  . CHF (congestive heart failure) (HCC)   . Chronic bronchitis (HCC)   . Diabetes mellitus without complication (HCC)   . Dyspnea    with exertion  . Graves disease   . Graves disease   . Headache   . Hypertension   . Myocardial infarction (HCC) 2013  . Obesity   . Pneumonia "several times"  . Seasonal allergies     Past Surgical History: Past Surgical History:  Procedure Laterality Date  . CESAREAN SECTION  2008, 2013   x 2  . CESAREAN SECTION N/A 07/21/2016   Procedure: CESAREAN SECTION;  Surgeon: Lesly Dukes, MD;  Location: [facility] BIRTHING SUITES;  Service: Obstetrics;  Laterality: N/A;  . EYE SURGERY Bilateral 2014   "for Graves Disease; eyelid retraction on left; decompression on the right""    Family History: Family History  Problem Relation Age of Onset  . Hypertension Mother     Social History: Social History   Socioeconomic History  . Marital status:  Single    Spouse name: Not on file  . Number of children: Not on file  . Years of education: Not on file  . Highest education level: Not on file  Occupational History  . Not on file  Social Needs  . Financial resource strain: Not on file  . Food insecurity:    Worry: Not on file    Inability: Not on file  . Transportation needs:    Medical: Not on file    Non-medical: Not on file  Tobacco Use  . Smoking status: Never Smoker  . Smokeless tobacco: Never Used  Substance and Sexual Activity  . Alcohol use: No  . Drug use: No  . Sexual activity: Not Currently    Comment: approx 13 - [redacted] wks gestation  Lifestyle  . Physical activity:    Days per week: Not on file  Minutes per session: Not on file  . Stress: Not on file  Relationships  . Social connections:    Talks on phone: Not on file    Gets together: Not on file    Attends religious service: Not on file    Active member of club or organization: Not on file    Attends meetings of clubs or organizations: Not on file    Relationship status: Not on file  Other Topics Concern  . Not on file  Social History Narrative  . Not on file    Allergies:  Allergies  Allergen Reactions  . Iodine Hives  . Shellfish Allergy Hives  . Tape Rash    Objective:    Vital Signs:   Temp:  [98.4 F (36.9 C)] 98.4 F (36.9 C) (07/30 0230) Pulse Rate:  [88-137] 98 (07/30 0817) Resp:  [19-48] 44 (07/30 0817) BP: (103-143)/(51-80) 113/68 (07/30 0817) SpO2:  [93 %-100 %] 95 % (07/30 0817) Weight:  [265 lb (120.2 kg)] 265 lb (120.2 kg) (07/30 0121)    Weight change: Filed Weights   07/18/18 0121  Weight: 265 lb (120.2 kg)    Intake/Output:  No intake or output data in the 24 hours ending 07/18/18 1009    Physical Exam    General:  Drowsy. No resp difficulty. Lying flat in bed HEENT: normal anicteric Neck: supple. JVP ~10. Carotids 2+ bilat; no bruits. No lymphadenopathy appreciated. Large goiter Cor: PMI nondisplaced.  Regular rate & rhythm. No rubs, gallops or murmurs. Lungs: clear. On 2L Gordon No wheeze Abdomen: obese soft, nontender, nondistended. No hepatosplenomegaly. No bruits or masses. Good bowel sounds. Extremities: no cyanosis, clubbing, rash, edema Neuro: alert & orientedx3, cranial nerves grossly intact. moves all 4 extremities w/o difficulty. Affect pleasant   Telemetry   NSR 80s. Personally reviewed.   EKG    NSR 87 bpm, incomplete RBBB and LAFB. Personally reviewed.   Labs   Basic Metabolic Panel: Recent Labs  Lab 07/18/18 0222  NA 138  K 3.0*  CL 101  CO2 23  GLUCOSE 99  BUN 8  CREATININE 0.51  CALCIUM 8.7*    Liver Function Tests: Recent Labs  Lab 07/18/18 0222  AST 21  ALT 20  ALKPHOS 96  BILITOT 0.4  PROT 6.9  ALBUMIN 3.1*   Recent Labs  Lab 07/18/18 0222  LIPASE 26   No results for input(s): AMMONIA in the last 168 hours.  CBC: Recent Labs  Lab 07/18/18 0222  WBC 11.7*  NEUTROABS 7.8*  HGB 11.2*  HCT 37.6  MCV 73.4*  PLT 230    Cardiac Enzymes: Recent Labs  Lab 07/18/18 0222  TROPONINI <0.03    BNP: BNP (last 3 results) Recent Labs    07/18/18 0222  BNP 164.9*    ProBNP (last 3 results) No results for input(s): PROBNP in the last 8760 hours.   CBG: No results for input(s): GLUCAP in the last 168 hours.  Coagulation Studies: No results for input(s): LABPROT, INR in the last 72 hours.   Imaging   Dg Chest 2 View  Result Date: 07/18/2018 CLINICAL DATA:  Shortness of breath, generalized weakness for 2 days. History of pneumonia, bronchitis, CHF. EXAM: CHEST - 2 VIEW COMPARISON:  Chest radiograph June 07, 2017 FINDINGS: Cardiac silhouette is mildly enlarged and unchanged. Mediastinal silhouette is not suspicious. Mild interstitial prominence, patchy bibasilar airspace opacities. No pleural effusion. No pneumothorax. Soft tissue planes and included osseous structures are normal. IMPRESSION: 1. Stable cardiomegaly. 2.  Interstitial  prominence and patchy bibasilar airspace opacities seen with pulmonary edema or infection. Electronically Signed   By: Awilda Metro M.D.   On: 07/18/2018 02:14      Medications:     Current Medications:    Infusions:      Patient Profile   Tara Lewallen Powellis a 27 y.o.femalewith history of graves diease, HTN, chronic diastolic heart failure, depression, morbid obesity, and pregnancy complicated by pre-eclampsia.   Presented to [facility] with dyspnea on exertion x2 weeks.   Assessment/Plan   1. Acute on chronic diastolic CHF: with acute hypoxic respiratory failure - Echo 05/2017, EF 65-70%, mild aortic regurgitation, PA pressure 66 mm Hg.  - Volume status mildly overloaded - Give additional 40 mg IV lasix x1 + 40 meq potassium - Repeat echo pending  2. HTN - Well controlled today. SBP 90-120s.   3. Graves disease - She is on methimazole at home. Per primary team.  - Has been referred to endocrine outpatient. Dr [name] was previously managing her Graves Disease. - Check TSH, T4, and T3.   4. ?OSA - Needs sleep study. Has been referred outpatient, but did not make appointment.  5. Hypokalemia - K 3.0. Supplemented.   Medication concerns reviewed with patient and pharmacy team. Barriers identified: Transportation. She is going to have an insurance gap Aug-Oct.   Length of Stay: 0  Alford Highland, NP  07/18/2018, 10:09 AM  Advanced Heart Failure Team Pager [phone] (M-F; 7a - 4p)  Please contact [facility] for night-coverage after hours (4p -7a ) and weekends on [email or web address]  Patient seen and examined with the above-signed Advanced Practice Provider and/or Housestaff. I personally reviewed laboratory data, imaging studies and relevant notes. I independently examined the patient and formulated the important aspects of the plan. I have edited the note to reflect any of my changes or salient points. I have personally discussed the plan with the patient and/or  family.  27 y/o woman as above with h/o Graves disease (inadequately treated) and diastolic HF. Presented with worsening SOB and volume overload associated with mild hypoxemia. Did not respond to doubling of outpatient diuretics.  BNP just mildly elevated. CXR with mild edema. Has had good response to IV lasix in ER. No longer hypoxic on ambulation. Orthopnea resolved. Wants to go home.   I reviewed echo. EF 55%. RV ok. On exam still with mild volume overload. Multiple (>5) drinks on bedside table. Will given another dose 80 IV lasix and then I think she can go home. Reinforced need for daily weights and reviewed use of sliding scale diuretics. Would give her metolazone 2.5 mg tablets to use prn when lasix fails. Would take kcl 40 with any metolazone dosing.   Discussed with FTPS by phone.   Arvilla Meres, MD  12:28 PM

## 2018-07-18 NOTE — Progress Notes (Signed)
  Echocardiogram 2D Echocardiogram has been performed.  Tara [name] M 07/18/2018, 10:29 AM

## 2018-07-18 NOTE — ED Notes (Signed)
Patient instructed to let this RN know when she needs to use the restroom so that nursing staff is able to measure urine output post lasix administration.

## 2018-07-18 NOTE — ED Notes (Addendum)
Pt states that she would like to go home and that she feels better after the lasix medication. This RN will page hospitalist to make them aware of patient's wishes.

## 2018-07-18 NOTE — ED Notes (Addendum)
Per cardiology rounding team, D/C bed request.

## 2018-07-18 NOTE — ED Notes (Signed)
Pt given lunch

## 2018-07-18 NOTE — ED Notes (Addendum)
Patient transported to X-ray 

## 2018-07-18 NOTE — H&P (Addendum)
Family Medicine Teaching [facility] Admission History and Physical Service Pager: [phone]  Patient name: Tara [name] Medical record number: [id number] Date of birth: 10-13-1991 Age: 27 y.o. Gender: female  Primary Care Provider: Garth Bigness, MD Consultants: none Code Status: Full  Chief Complaint: SOB  Assessment and Plan: Tara [name] is a 27 y.o. female presenting with worsening SOB. PMH is significant for Grave's disease, hyperthyroidism, and HFpEF.   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  She reports being tachypneic at baseline since her heart trouble began.  Her shortness of breath was significantly worsened today she found that she could not walk up her steps to her home.  On presentation to the ED her vitals were significant for mild hypertension with systolics up to 130 and tachypnea in the 30s.  Chest x-ray was remarkable for interstitial prominence consistent with pulmonary edema or infection.  BNP was 164.  EKG was unremarkable and initial troponin was negative.  In light of these labs the most likely reason for this shortness of breath is acute decompensated heart failure.  The fact the patient is tachypneic at baseline to suggest that there may be an additional unaddressed etiology.  Pulmonary artery pressure during last echocardiogram on 05/2017 was 66 mmHg.  Her body habitus suggests that she may have components of OSA and obesity hypoventilation syndrome which contribute to pulmonary hypertension. Well's score is 1.5 for tachycardia which is chronic. Less likely PE. Will continue to monitor closely.  - Admit to telemetry, attending Dr. Lloyd Huger - Continue diuresis - Echocardiogram - Strict I's and O's  - Monitor vitals - Up with assistance  Acute on chronic HFpEF: Patient reports compliance with Lasix 40mg  daily. Patient has a history of postpartum dilated  cardiomyopathy.  Her current home regimen includes Lasix 40 mg daily.  Patient reported taking 80 mg on Saturday and Sunday without much success for additional diuresis. - Continue diuresis with Lasix 40 mg IV - Monitor creatinine - replete potassium as needed - Strict I's and O's  Grave's Disease: Patient takes methimazole 10 mg at home daily for Graves' disease.  Patient reports compliance with all medications. - Continue methimazole 10 mg - Will recheck free T4, free T3  Leukocytosis Admission labs show a leukocytosis up to 11.7.  Patient denies fevers presently though she did report recent GI symptoms of nausea vomiting and diarrhea on Saturday and Sunday.  Per patient the symptoms have entirely resolved, she is no longer nauseated and her bowel movements have returned to normal.  This mild elevation may represent the tail end of her convalescence. - Monitor WBCs  FEN/GI: Heart healthy diet Prophylaxis: Lovenox  Disposition: admit to med-surg  History of Present Illness:  Tara [name] is a 27 y.o. female presenting with SOB. Patient thought she had a virus on Saturday and Sunday with vomiting. Today she woke up with shortness of breath. Denies any blood in stool or vomit. 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 No Sick contacts at home. Vomiting and diarrhea resolved Sunday.Pt reports that she has been short of breath at baseline since she first had heart problems (02/2017?).  Her shortness of breath was significantly worsened today and she cannot go up and down the stairs without being short of breath. Patient reports she always lays flat. Only uses 1 pillow at night when sleeping. Patient needs thyroid taken out to help  with tachycardia. Denies any lower extremity swelling. Patient took extra Lasix (doubled her dose for one time) Saturday and Sunday for SOB.   Review Of Systems: Per HPI with the following additions:   Review of Systems  Constitutional:  Negative for fever and malaise/fatigue.  HENT: Negative for sore throat.   Respiratory: Positive for shortness of breath.   Cardiovascular: Positive for palpitations. Negative for chest pain, orthopnea and leg swelling.  Gastrointestinal: Positive for diarrhea and vomiting. Negative for abdominal pain, blood in stool, constipation and nausea.  Neurological: Positive for dizziness and headaches.    Patient Active Problem List   Diagnosis Date Noted  . CHF exacerbation (HCC) 07/18/2018  . Skin lesion of right leg 07/22/2017  . Depression 06/23/2017  . Thyroid disease   . Chronic diastolic heart failure (HCC) 03/08/2017  . Birth control counseling 01/31/2017  . Dilated cardiomyopathy (HCC)   . Pulmonary hypertension (HCC)   . Tachycardia with heart rate 100-120 beats per minute   . Graves disease 02/11/2016  . HTN (hypertension) 02/11/2016  . Morbid obesity due to excess calories [facility])     Past Medical History: Past Medical History:  Diagnosis Date  . Anemia   . Anxiety   . CAP (community acquired pneumonia) 02/13/2016  . CHF (congestive heart failure) (HCC)   . Chronic bronchitis (HCC)   . Diabetes mellitus without complication (HCC)   . Dyspnea    with exertion  . Graves disease   . Graves disease   . Headache   . Hypertension   . Myocardial infarction (HCC) 2013  . Obesity   . Pneumonia "several times"  . Seasonal allergies     Past Surgical History: Past Surgical History:  Procedure Laterality Date  . CESAREAN SECTION  2008, 2013   x 2  . CESAREAN SECTION N/A 07/21/2016   Procedure: CESAREAN SECTION;  Surgeon: Lesly Dukes, MD;  Location: [facility] BIRTHING SUITES;  Service: Obstetrics;  Laterality: N/A;  . EYE SURGERY Bilateral 2014   "for Graves Disease; eyelid retraction on left; decompression on the right""    Social History: Social History   Tobacco Use  . Smoking status: Never Smoker  . Smokeless tobacco: Never Used  Substance Use Topics  . Alcohol use:  No  . Drug use: No   Additional social history: Lives with kids (2,3,5) and dad. Denies alcohol, tobacco or illicit drug use.  Please also refer to relevant sections of EMR.  Family History: Family History  Problem Relation Age of Onset  . Hypertension Mother     Allergies and Medications: Allergies  Allergen Reactions  . Iodine Hives  . Shellfish Allergy Hives  . Tape Rash   No current facility-administered medications on file prior to encounter.    Current Outpatient Medications on File Prior to Encounter  Medication Sig Dispense Refill  . diphenhydramine-acetaminophen (TYLENOL PM) 25-500 MG TABS tablet Take 2 tablets by mouth at bedtime as needed (pain/sleep).    . furosemide (LASIX) 40 MG tablet Take 40 mg by mouth daily.     Marland Kitchen labetalol (NORMODYNE) 300 MG tablet Take 2 tablets (600 mg total) by mouth 2 (two) times daily. (Patient taking differently: Take 300 mg by mouth 2 (two) times daily. ) 120 tablet 3  . methimazole (TAPAZOLE) 10 MG tablet Take 4 tablets (40 mg total) by mouth 2 (two) times daily. 30 tablet 0  . triamcinolone ointment (KENALOG) 0.5 % Apply 1 application topically 2 (two) times daily. (Patient not taking: Reported  on 07/18/2018) 30 g 0    Objective: BP 123/75   Pulse 97   Temp 98.4 F (36.9 C) (Oral)   Resp (!) 31   Ht 5\' 6"  (1.676 m)   Wt 265 lb (120.2 kg)   LMP 07/03/2018   SpO2 98%   BMI 42.77 kg/m   Exam: Physical Exam  Constitutional: She is oriented to person, place, and time. She appears well-developed and well-nourished.  Non-toxic appearance. She does not appear ill.  Eyes:  Right eye with mild exophthalmia  Cardiovascular: Normal rate.  Murmur (2/6 systolic) heard. Pulmonary/Chest: Tachypnea noted. She is in respiratory distress (saturating well on room air during the interveiw with respiratory rates from 20-40). She has no wheezes. She has no rhonchi. She has rales in the right lower field and the left lower field.  Abdominal: Soft.  Bowel sounds are normal. She exhibits no distension. There is no tenderness.  Musculoskeletal:       Right lower leg: Normal. She exhibits no edema.       Left lower leg: Normal. She exhibits no edema.  Lymphadenopathy:    She has no cervical adenopathy.  Neurological: She is alert and oriented to person, place, and time. She is not disoriented. No cranial nerve deficit.  Psychiatric: She has a normal mood and affect. Her behavior is normal.    Labs and Imaging: CBC BMET  Recent Labs  Lab 07/18/18 0222  WBC 11.7*  HGB 11.2*  HCT 37.6  PLT 230   Recent Labs  Lab 07/18/18 0222  NA 138  K 3.0*  CL 101  CO2 23  BUN 8  CREATININE 0.51  GLUCOSE 99  CALCIUM 8.7*     BNP 164.9  Dg Chest 2 View  Result Date: 07/18/2018 CLINICAL DATA:  Shortness of breath, generalized weakness for 2 days. History of pneumonia, bronchitis, CHF. EXAM: CHEST - 2 VIEW COMPARISON:  Chest radiograph June 07, 2017 FINDINGS: Cardiac silhouette is mildly enlarged and unchanged. Mediastinal silhouette is not suspicious. Mild interstitial prominence, patchy bibasilar airspace opacities. No pleural effusion. No pneumothorax. Soft tissue planes and included osseous structures are normal. IMPRESSION: 1. Stable cardiomegaly. 2. Interstitial prominence and patchy bibasilar airspace opacities seen with pulmonary edema or infection. Electronically Signed   By: Awilda Metro M.D.   On: 07/18/2018 02:14    Mirian Mo, MD 07/18/2018, 5:00 AM PGY-1, [facility] Health Family Medicine FPTS Intern pager: [phone], text pages welcome  FPTS Upper-Level Resident Addendum   I have independently interviewed and examined the patient. I have discussed the above with the original author and agree with their documentation. My edits for correction/addition/clarification are in purple. Please see also any attending notes.    [place] Griffin Dewilde, DO PGY-2, [facility] Health Family Medicine 07/18/2018 6:47 AM  FPTS Service pager: [phone]  (text pages welcome through [facility])

## 2018-07-18 NOTE — ED Provider Notes (Signed)
Discharged [facility] EMERGENCY DEPARTMENT Provider Note   CSN: [id number] Arrival date & time: 07/18/18  0116     History   Chief Complaint Chief Complaint  Patient presents with  . Shortness of Breath    HPI Tara [name] is a 27 y.o. female.  Patient with history of hypertension, Graves' disease, CHF presenting with dyspnea on exertion that onset today.  She feels better after receiving oxygen via EMS.  Denies any cough or fever.  States compliance with her medications including her diuretics.  States her weight is actually decreased and she has not had any noticeable leg swelling.  She did have some vomiting and diarrhea 2 days ago which is since resolved.  4 episodes of vomiting yesterday and 2 episodes of diarrhea but none today.  No fever or abdominal pain.  She called EMS tonight because she was having severe dyspnea when she tries to ambulate.  She normally sleeps propped up and this is unchanged.  Has not noticed any weight gain.  The history is provided by the patient.  Shortness of Breath  Associated symptoms include vomiting. Pertinent negatives include no headaches, no chest pain and no abdominal pain.    Past Medical History:  Diagnosis Date  . Anemia   . Anxiety   . CAP (community acquired pneumonia) 02/13/2016  . CHF (congestive heart failure) (HCC)   . Chronic bronchitis (HCC)   . Diabetes mellitus without complication (HCC)   . Dyspnea    with exertion  . Graves disease   . Graves disease   . Headache   . Hypertension   . Myocardial infarction (HCC) 2013  . Obesity   . Pneumonia "several times"  . Seasonal allergies     Patient Active Problem List   Diagnosis Date Noted  . Skin lesion of right leg 07/22/2017  . Depression 06/23/2017  . Thyroid disease   . Chronic diastolic heart failure (HCC) 03/08/2017  . Birth control counseling 01/31/2017  . Dilated cardiomyopathy (HCC)   . Pulmonary hypertension (HCC)   . Tachycardia  with heart rate 100-120 beats per minute   . Graves disease 02/11/2016  . HTN (hypertension) 02/11/2016  . Morbid obesity due to excess calories [facility])     Past Surgical History:  Procedure Laterality Date  . CESAREAN SECTION  2008, 2013   x 2  . CESAREAN SECTION N/A 07/21/2016   Procedure: CESAREAN SECTION;  Surgeon: Lesly Dukes, MD;  Location: [facility] BIRTHING SUITES;  Service: Obstetrics;  Laterality: N/A;  . EYE SURGERY Bilateral 2014   "for Graves Disease; eyelid retraction on left; decompression on the right""     OB History    Gravida  6   Para  5   Term  4   Preterm  1   AB  0   Living  5     SAB  0   TAB  0   Ectopic  0   Multiple  0   Live Births  4            Home Medications    Prior to Admission medications   Medication Sig Start Date End Date Taking? Authorizing Provider  Carboxymethylcellul-Glycerin (LUBRICATING EYE DROPS OP) Place 1 drop into the right eye 3 (three) times daily as needed (dry eyes).    [provider]  furosemide (LASIX) 40 MG tablet Take 80 mg by mouth daily.    [provider]  labetalol (NORMODYNE) 300 MG tablet  Take 2 tablets (600 mg total) by mouth 2 (two) times daily. 06/23/17   Little Ishikawa, NP  methimazole (TAPAZOLE) 10 MG tablet Take 4 tablets (40 mg total) by mouth 2 (two) times daily. 10/21/17   Bensimhon, Bevelyn Buckles, MD  triamcinolone ointment (KENALOG) 0.5 % Apply 1 application topically 2 (two) times daily. 07/22/17   Garth Bigness, MD    Family History Family History  Problem Relation Age of Onset  . Hypertension Mother     Social History Social History   Tobacco Use  . Smoking status: Never Smoker  . Smokeless tobacco: Never Used  Substance Use Topics  . Alcohol use: No  . Drug use: No     Allergies   Iodine; Shellfish allergy; and Tape   Review of Systems Review of Systems  Constitutional: Negative for activity change and appetite change.  HENT: Negative for congestion and  sinus pain.   Eyes: Negative for visual disturbance.  Respiratory: Positive for shortness of breath. Negative for chest tightness.   Cardiovascular: Negative for chest pain.  Gastrointestinal: Positive for diarrhea, nausea and vomiting. Negative for abdominal pain.  Genitourinary: Negative for dysuria, hematuria, vaginal bleeding and vaginal discharge.  Musculoskeletal: Negative for arthralgias and back pain.  Neurological: Negative for dizziness, weakness, light-headedness and headaches.    all other systems are negative except as noted in the HPI and PMH.    Physical Exam Updated Vital Signs Ht 5\' 6"  (1.676 m)   Wt 120.2 kg (265 lb)   SpO2 95%   BMI 42.77 kg/m   Physical Exam  Constitutional: She is oriented to person, place, and time. She appears well-developed and well-nourished. No distress.  Dyspneic with conversation  HENT:  Head: Normocephalic and atraumatic.  Mouth/Throat: Oropharynx is clear and moist. No oropharyngeal exudate.  Eyes: Pupils are equal, round, and reactive to light. Conjunctivae and EOM are normal.  Neck: Normal range of motion. Neck supple.  No meningismus.  Cardiovascular: Normal rate and intact distal pulses.  Murmur heard. Pulmonary/Chest: Effort normal. No respiratory distress. She has rales.  Bibasilar crackles  Abdominal: Soft. There is no tenderness. There is no rebound and no guarding.  Musculoskeletal: Normal range of motion. She exhibits no edema or tenderness.  Neurological: She is alert and oriented to person, place, and time. No cranial nerve deficit. She exhibits normal muscle tone. Coordination normal.  No ataxia on finger to nose bilaterally. No pronator drift. 5/5 strength throughout. CN 2-12 intact.Equal grip strength. Sensation intact.   Skin: Skin is warm. Capillary refill takes less than 2 seconds.  Psychiatric: She has a normal mood and affect. Her behavior is normal.  Nursing note and vitals reviewed.    ED Treatments /  Results  Labs (all labs ordered are listed, but only abnormal results are displayed) Labs Reviewed  CBC WITH DIFFERENTIAL/PLATELET - Abnormal; Notable for the following components:      Result Value   WBC 11.7 (*)    RBC 5.12 (*)    Hemoglobin 11.2 (*)    MCV 73.4 (*)    MCH 21.9 (*)    MCHC 29.8 (*)    Neutro Abs 7.8 (*)    All other components within normal limits  COMPREHENSIVE METABOLIC PANEL - Abnormal; Notable for the following components:   Potassium 3.0 (*)    Calcium 8.7 (*)    Albumin 3.1 (*)    All other components within normal limits  BRAIN NATRIURETIC PEPTIDE - Abnormal; Notable for the following components:  B Natriuretic Peptide 164.9 (*)    All other components within normal limits  LIPASE, BLOOD  TROPONIN I  I-STAT BETA HCG BLOOD, ED ([facility], [facility], [facility] ONLY)    EKG EKG Interpretation  Date/Time:  Tuesday July 18 2018 01:43:34 EDT Ventricular Rate:  87 PR Interval:    QRS Duration: 108 QT Interval:  393 QTC Calculation: 473 R Axis:   22 Text Interpretation:  Sinus rhythm Incomplete RBBB and LAFB RSR' in V1 or V2, right VCD or RVH Probable left ventricular hypertrophy Baseline wander in lead(s) V1 No significant change was found Confirmed by Glynn Octave [phone]) on 07/18/2018 1:47:39 AM   Radiology Dg Chest 2 View  Result Date: 07/18/2018 CLINICAL DATA:  Shortness of breath, generalized weakness for 2 days. History of pneumonia, bronchitis, CHF. EXAM: CHEST - 2 VIEW COMPARISON:  Chest radiograph June 07, 2017 FINDINGS: Cardiac silhouette is mildly enlarged and unchanged. Mediastinal silhouette is not suspicious. Mild interstitial prominence, patchy bibasilar airspace opacities. No pleural effusion. No pneumothorax. Soft tissue planes and included osseous structures are normal. IMPRESSION: 1. Stable cardiomegaly. 2. Interstitial prominence and patchy bibasilar airspace opacities seen with pulmonary edema or infection. Electronically Signed   By: Awilda Metro M.D.   On: 07/18/2018 02:14    Procedures Procedures (including critical care time)  Medications Ordered in ED Medications  furosemide (LASIX) injection 40 mg (has no administration in time range)     Initial Impression / Assessment and Plan / ED Course  I have reviewed the triage vital signs and the nursing notes.  Pertinent labs & imaging results that were available during my care of the patient were reviewed by me and considered in my medical decision making (see chart for details).     Patient with history of high-output diastolic heart failure presenting with dyspnea on exertion for the past 1 day.  Denies chest pain, cough or fever.  She appears to have mild volume overload and is dyspneic with conversation.  Will give IV Lasix.  Chest x-ray consistent with vascular congestion and pulmonary edema.  Patient given IV Lasix.  Blood pressure remains well controlled.  Patient tachypneic at rest and desaturates with exertion.  Will need admission for likely decompensated heart failure.  Discussed with family practice residents.     Final Clinical Impressions(s) / ED Diagnoses   Final diagnoses:  Acute on chronic diastolic congestive heart failure [facility])    ED Discharge Orders    None       Scharlene Catalina, Jeannett Senior, MD 07/18/18 0600

## 2018-07-18 NOTE — Progress Notes (Signed)
Heart Failure Team recommended IV Lasix 80 once, then once she had diuresed 1000cc, she was okay for discharge.  UOP 1050cc at present.  Patient denies SOB, is not tachypnic, sating appropriately, was able to walk to bathroom without desaturation.  She is to take Lasix and use metolazone prn if Lasix fails, but should take K-Dur 40 with every metolazone dose.    TSH <0.010, Free T4 >5.50.  Patient states that she has not been able to afford methimazole because she didn't have insurance.  Levels appear chronic.  No concern for thyroid storm at present.  Patient educated on signs and symptoms of hyperthyroidism and encouraged to call PCP or return to ED if occur.  Will need close follow up.  Appointment made at [facility] 8/2 at 4:00pm.  Patient encouraged to follow up. Also encouraged to see Dr. [name], her endocrinologist within a week.  Luis Abed, D.O.  PGY-1 Family Medicine  07/18/2018 3:23 PM

## 2018-07-19 ENCOUNTER — Telehealth: Payer: Self-pay | Admitting: Licensed Clinical Social Worker

## 2018-07-19 ENCOUNTER — Telehealth (HOSPITAL_COMMUNITY): Payer: Self-pay | Admitting: Surgery

## 2018-07-19 LAB — T3, FREE: T3, Free: 29.1 pg/mL — ABNORMAL HIGH (ref 2.0–4.4)

## 2018-07-19 NOTE — Telephone Encounter (Signed)
Patient called to reschedule AHF appt because she was just " in the hospital and saw Dr. [name]".  I have rescheduled her appt for tomorrow to an AHF Clinic hospital follow-up appt on Aug 6th at 11:00.  She requests taxi transportation to and from appt due to the fact that she has no other way to get here.
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
## 2018-07-20 ENCOUNTER — Encounter (HOSPITAL_COMMUNITY): Payer: Medicaid Other

## 2018-07-21 ENCOUNTER — Ambulatory Visit: Payer: Medicaid Other | Admitting: Family Medicine

## 2018-07-25 ENCOUNTER — Inpatient Hospital Stay (HOSPITAL_COMMUNITY): Payer: Medicaid Other

## 2018-07-25 ENCOUNTER — Telehealth: Payer: Self-pay | Admitting: Licensed Clinical Social Worker

## 2018-07-25 NOTE — Telephone Encounter (Signed)
CSW left message for patient to return call due to missed clinic appointment. Awaiting return. [name], LCSW, CCSW-MCS [phone] '

## 2018-08-20 ENCOUNTER — Other Ambulatory Visit: Payer: Self-pay | Admitting: Family Medicine

## 2018-08-22 NOTE — Telephone Encounter (Signed)
Will refill lasix, but patient overdue for appt - missed both our hospital follow up patient and also advanced Heart failure clinic appt. Please call and have patient seen as soon as they can for hospital follow up. **May need to talk to deborah or have taxi plan ready if she answers, she unfortunately frequently no shows and cites transportation as a problem.

## 2018-09-11 ENCOUNTER — Encounter (HOSPITAL_COMMUNITY): Payer: Self-pay

## 2018-09-11 ENCOUNTER — Other Ambulatory Visit: Payer: Self-pay

## 2018-09-11 ENCOUNTER — Emergency Department (HOSPITAL_COMMUNITY): Payer: Medicaid Other

## 2018-09-11 ENCOUNTER — Emergency Department (HOSPITAL_COMMUNITY)
Admission: EM | Admit: 2018-09-11 | Discharge: 2018-09-12 | Disposition: A | Discharge status: Home / Self Care | Payer: Medicaid Other | Attending: Emergency Medicine | Admitting: Emergency Medicine

## 2018-09-11 DIAGNOSIS — I509 Heart failure, unspecified: Secondary | ICD-10-CM | POA: Insufficient documentation

## 2018-09-11 DIAGNOSIS — E119 Type 2 diabetes mellitus without complications: Secondary | ICD-10-CM | POA: Insufficient documentation

## 2018-09-11 DIAGNOSIS — R0789 Other chest pain: Secondary | ICD-10-CM | POA: Diagnosis not present

## 2018-09-11 DIAGNOSIS — I110 Hypertensive heart disease with heart failure: Secondary | ICD-10-CM | POA: Insufficient documentation

## 2018-09-11 LAB — I-STAT TROPONIN, ED
TROPONIN I, POC: 0 ng/mL (ref 0.00–0.08)
Troponin i, poc: 0 ng/mL (ref 0.00–0.08)

## 2018-09-11 LAB — CBC
HCT: 41.3 % (ref 36.0–46.0)
HEMOGLOBIN: 12.4 g/dL (ref 12.0–15.0)
MCH: 21.6 pg — AB (ref 26.0–34.0)
MCHC: 30 g/dL (ref 30.0–36.0)
MCV: 72 fL — AB (ref 78.0–100.0)
PLATELETS: 292 10*3/uL (ref 150–400)
RBC: 5.74 MIL/uL — AB (ref 3.87–5.11)
RDW: 14.7 % (ref 11.5–15.5)
WBC: 12.4 10*3/uL — AB (ref 4.0–10.5)

## 2018-09-11 LAB — HEPATIC FUNCTION PANEL
ALK PHOS: 117 U/L (ref 38–126)
ALT: 20 U/L (ref 0–44)
AST: 31 U/L (ref 15–41)
Albumin: 3.2 g/dL — ABNORMAL LOW (ref 3.5–5.0)
BILIRUBIN INDIRECT: 0.4 mg/dL (ref 0.3–0.9)
Bilirubin, Direct: 0.4 mg/dL — ABNORMAL HIGH (ref 0.0–0.2)
Total Bilirubin: 0.8 mg/dL (ref 0.3–1.2)
Total Protein: 7.4 g/dL (ref 6.5–8.1)

## 2018-09-11 LAB — BASIC METABOLIC PANEL
ANION GAP: 12 (ref 5–15)
BUN: 5 mg/dL — ABNORMAL LOW (ref 6–20)
CALCIUM: 9.5 mg/dL (ref 8.9–10.3)
CHLORIDE: 102 mmol/L (ref 98–111)
CO2: 25 mmol/L (ref 22–32)
CREATININE: 0.43 mg/dL — AB (ref 0.44–1.00)
GFR calc non Af Amer: >60 mL/min (ref >60)
Glucose, Bld: 89 mg/dL (ref 70–99)
Potassium: 3.7 mmol/L (ref 3.5–5.1)
SODIUM: 139 mmol/L (ref 135–145)

## 2018-09-11 LAB — I-STAT BETA HCG BLOOD, ED (MC, WL, AP ONLY)

## 2018-09-11 LAB — TSH: TSH: <0.001 u[IU]/mL — ABNORMAL LOW (ref 0.350–4.500)

## 2018-09-11 LAB — BRAIN NATRIURETIC PEPTIDE: B NATRIURETIC PEPTIDE 5: 142.6 pg/mL — AB (ref 0.0–100.0)

## 2018-09-11 LAB — D-DIMER, QUANTITATIVE (NOT AT ARMC): D DIMER QUANT: 0.82 ug{FEU}/mL — AB (ref 0.00–0.50)

## 2018-09-11 MED ORDER — IOPAMIDOL (ISOVUE-370) INJECTION 76%
INTRAVENOUS | Status: AC
  Administered 2018-09-11: 100 mL
  Filled 2018-09-11: qty 100

## 2018-09-11 MED ORDER — ONDANSETRON HCL 4 MG/2ML IJ SOLN
4.0000 mg | Freq: Once | INTRAMUSCULAR | Status: AC
  Administered 2018-09-11: 4 mg via INTRAVENOUS
  Filled 2018-09-11: qty 2

## 2018-09-11 MED ORDER — ACETAMINOPHEN 500 MG PO TABS
1000.0000 mg | ORAL_TABLET | Freq: Once | ORAL | Status: AC
  Administered 2018-09-11: 1000 mg via ORAL
  Filled 2018-09-11: qty 2

## 2018-09-11 MED ORDER — IOPAMIDOL (ISOVUE-370) INJECTION 76%: 100.0000 mL | Freq: Once | INTRAVENOUS | Status: DC | PRN

## 2018-09-11 MED ORDER — SODIUM CHLORIDE 0.9 % IV BOLUS
500.0000 mL | Freq: Once | INTRAVENOUS | Status: AC
  Administered 2018-09-11: 500 mL via INTRAVENOUS

## 2018-09-11 MED ORDER — IBUPROFEN 800 MG PO TABS: 800.0000 mg | ORAL_TABLET | Freq: Four times a day (QID) | ORAL | 0 refills | Status: DC | PRN

## 2018-09-11 MED ORDER — KETOROLAC TROMETHAMINE 30 MG/ML IJ SOLN
30.0000 mg | Freq: Once | INTRAMUSCULAR | Status: AC
  Administered 2018-09-11: 30 mg via INTRAVENOUS
  Filled 2018-09-11: qty 1

## 2018-09-11 NOTE — ED Provider Notes (Signed)
Emergency Department Provider Note   I have reviewed the triage vital signs and the nursing notes.   HISTORY  Chief Complaint Chest Pain   HPI Tara [name] is a 27 y.o. female with PMH of Graves Disease, CHF, HTN, and DM presents to the emergency department for evaluation of baseline cough but worsening shortness of breath and central chest discomfort.  Patient states that the shortness of breath symptoms and chest pain have been ongoing for the past 2 days.  She states that they are worse with exertion.  Chest pain is central and non-radiating.  She has no chest pain at rest.  She has not recorded any fevers or had other infection symptoms at home.  No vomiting or diarrhea.  She has been compliant with all medications eluding her Lasix.  She states that she is near her dry weight does not feel fluid overloaded.   Past Medical History:  Diagnosis Date  . Anemia   . Anxiety   . CAP (community acquired pneumonia) 02/13/2016  . CHF (congestive heart failure) (HCC)   . Chronic bronchitis (HCC)   . Diabetes mellitus without complication (HCC)   . Dyspnea    with exertion  . Graves disease   . Graves disease   . Headache   . Hypertension   . Myocardial infarction (HCC) 2013  . Obesity   . Pneumonia "several times"  . Seasonal allergies     Patient Active Problem List   Diagnosis Date Noted  . CHF exacerbation (HCC) 07/18/2018  . Skin lesion of right leg 07/22/2017  . Depression 06/23/2017  . Thyroid disease   . Chronic diastolic heart failure (HCC) 03/08/2017  . Birth control counseling 01/31/2017  . Dilated cardiomyopathy (HCC)   . Pulmonary hypertension (HCC)   . Tachycardia with heart rate 100-120 beats per minute   . Hyperthyroidism   . Noncompliance with medication regimen   . Graves disease 02/11/2016  . HTN (hypertension) 02/11/2016  . Morbid obesity due to excess calories [facility])     Past Surgical History:  Procedure Laterality Date  . CESAREAN SECTION   2008, 2013   x 2  . CESAREAN SECTION N/A 07/21/2016   Procedure: CESAREAN SECTION;  Surgeon: Lesly Dukes, MD;  Location: [facility] BIRTHING SUITES;  Service: Obstetrics;  Laterality: N/A;  . EYE SURGERY Bilateral 2014   "for Graves Disease; eyelid retraction on left; decompression on the right""    Allergies Iodine; Shellfish allergy; Latex; and Tape  Family History  Problem Relation Age of Onset  . Hypertension Mother     Social History Social History   Tobacco Use  . Smoking status: Never Smoker  . Smokeless tobacco: Never Used  Substance Use Topics  . Alcohol use: No  . Drug use: No    Review of Systems  Constitutional: No fever/chills Eyes: No visual changes. ENT: No sore throat. Cardiovascular: Positive chest pain. Respiratory: Positive shortness of breath. Gastrointestinal: No abdominal pain.  No nausea, no vomiting.  No diarrhea.  No constipation. Genitourinary: Negative for dysuria. Musculoskeletal: Negative for back pain. Skin: Negative for rash. Neurological: Negative for headaches, focal weakness or numbness.  10-point ROS otherwise negative.  ____________________________________________   PHYSICAL EXAM:  VITAL SIGNS: ED Triage Vitals  Enc Vitals Group     BP 09/11/18 1903 118/83     Pulse Rate 09/11/18 1903 (!) 103     Resp 09/11/18 1903 20     Temp 09/11/18 1903 100 F (37.8 C)  Temp Source 09/11/18 1903 Oral     SpO2 09/11/18 1857 93 %     Weight 09/11/18 1859 273 lb (123.8 kg)     Height 09/11/18 1859 5\' 7"  (1.702 m)     Pain Score 09/11/18 1904 0   Constitutional: Alert and oriented. Well appearing and in no acute distress. Eyes: Conjunctivae are normal.  Head: Atraumatic. Nose: No congestion/rhinnorhea. Mouth/Throat: Mucous membranes are moist.  Neck: No stridor. Cardiovascular: Normal rate, regular rhythm. Good peripheral circulation. Grossly normal heart sounds.   Respiratory: Slight increased respiratory effort.  No retractions.  Lungs CTABL. No rhonchi or wheezing. Gastrointestinal: Soft and nontender. No distention.  Musculoskeletal: No lower extremity tenderness nor edema. No gross deformities of extremities. Neurologic:  Normal speech and language. No gross focal neurologic deficits are appreciated.  Skin:  Skin is warm, dry and intact. No rash noted.  ____________________________________________   LABS (all labs ordered are listed, but only abnormal results are displayed)  Labs Reviewed  BASIC METABOLIC PANEL - Abnormal; Notable for the following components:      Result Value   BUN 5 (*)    Creatinine, Ser 0.43 (*)    All other components within normal limits  CBC - Abnormal; Notable for the following components:   WBC 12.4 (*)    RBC 5.74 (*)    MCV 72.0 (*)    MCH 21.6 (*)    All other components within normal limits  BRAIN NATRIURETIC PEPTIDE - Abnormal; Notable for the following components:   B Natriuretic Peptide 142.6 (*)    All other components within normal limits  HEPATIC FUNCTION PANEL - Abnormal; Notable for the following components:   Albumin 3.2 (*)    Bilirubin, Direct 0.4 (*)    All other components within normal limits  D-DIMER, QUANTITATIVE (NOT AT [facility]) - Abnormal; Notable for the following components:   D-Dimer, Quant 0.82 (*)    All other components within normal limits  TSH - Abnormal; Notable for the following components:   TSH <0.001 (*)    All other components within normal limits  I-STAT TROPONIN, ED  I-STAT BETA HCG BLOOD, ED ([facility], [facility], [facility] ONLY)  I-STAT TROPONIN, ED   ____________________________________________  EKG   EKG Interpretation  Date/Time:  Monday September 11 2018 19:00:57 EDT Ventricular Rate:  108 PR Interval:    QRS Duration: 99 QT Interval:  344 QTC Calculation: 462 R Axis:   -98 Text Interpretation:  Sinus tachycardia Consider right atrial enlargement Left anterior fascicular block Consider right ventricular hypertrophy Borderline T  abnormalities, diffuse leads Baseline wander in lead(s) I II aVR No STEMI.  Confirmed by Alona Bene [phone]) on 09/11/2018 7:41:00 PM Also confirmed by Alona Bene [phone]), editor Barbette Hair [phone])  on 09/12/2018 7:05:33 AM       ____________________________________________  RADIOLOGY  Dg Chest 2 View  Result Date: 09/11/2018 CLINICAL DATA:  Chest pain and shortness of breath for 1 day. EXAM: CHEST - 2 VIEW COMPARISON:  Chest x-rays dated 07/18/2018 and 06/07/2017. FINDINGS: Mild cardiomegaly is stable. Overall cardiomediastinal silhouette is stable. Again noted is central pulmonary vascular congestion and mild bilateral interstitial prominence, presumed interstitial edema. No pleural effusion or pneumothorax seen. IMPRESSION: Findings suggest recurrent or chronic CHF with associated central pulmonary vascular congestion and mild bilateral interstitial edema. Findings on today's exam are similar to the appearance on chest x-rays of 07/18/2018 and 06/07/2017. Electronically Signed   By: Bary Richard M.D.   On: 09/11/2018 20:15  Ct Angio Chest Pe W And/or Wo Contrast  Result Date: 09/11/2018 CLINICAL DATA:  Centralized chest pain and dizziness dyspnea. EXAM: CT ANGIOGRAPHY CHEST WITH CONTRAST TECHNIQUE: Multidetector CT imaging of the chest was performed using the standard protocol during bolus administration of intravenous contrast. Multiplanar CT image reconstructions and MIPs were obtained to evaluate the vascular anatomy. CONTRAST:  ISOVUE-370 IOPAMIDOL (ISOVUE-370) INJECTION 76% COMPARISON:  10/25/2016 FINDINGS: Cardiovascular: Stable cardiomegaly with small pericardial effusion. No aortic aneurysm or dissection. No large central pulmonary embolus. Great vessels are unremarkable. Mediastinum/Nodes: No enlarged lymph nodes. Slight transverse luminal narrowing of the trachea secondary to thyromegaly, similar in appearance. No discrete thyroid mass. CT appearance of the esophagus is  unremarkable. Lungs/Pleura: Patchy ground-glass opacities noted throughout all lobes of the lung, nonspecific but may represent stigmata of pulmonary edema, alveolitis or pneumonitis, hypoventilatory change or combination thereof. Upper Abdomen: There is reflux of contrast into the hepatic veins suggesting a component of right heart failure. Musculoskeletal: No chest wall abnormality. No acute or significant osseous findings. Review of the MIP images confirms the above findings. IMPRESSION: 1. Lung volumes are low and there is mosaic attenuation of the lungs with ground-glass opacities throughout all lobes. Differential possibilities may include stigmata of pulmonary edema, multifocal alveolitis/pneumonitis and/or hypoventilatory change among some more common possibilities. 2. No acute pulmonary embolus. 3. Stable thyromegaly compatible with Grave's disease. Electronically Signed   By: Tollie Eth M.D.   On: 09/11/2018 23:00    ____________________________________________   PROCEDURES  Procedure(s) performed:   Procedures  None ____________________________________________   INITIAL IMPRESSION / ASSESSMENT AND PLAN / ED COURSE  Pertinent labs & imaging results that were available during my care of the patient were reviewed by me and considered in my medical decision making (see chart for details).  Patient presents to the emergency department with cough, shortness of breath, central chest pain.  She has several medical comorbidities including congestive heart failure.  Does not appear volume overloaded on my exam.  Low suspicion for CAD.  Patient does have tachycardia which is near her baseline but given this plan for d-dimer.  Low suspicion for PE.  Patient also with borderline fever here but no infection symptoms at home.  Chest x-ray, labs pending.   9:55 AM D-dimer elevated. CXR with likely chronic edema and similar to prior. Plan for CTA and discharge if negative. Patient is feeling much  better here in the ED and is ambulatory without difficulty.   Care transferred to [facility] who will follow CTA and reassess.  ____________________________________________  FINAL CLINICAL IMPRESSION(S) / ED DIAGNOSES  Final diagnoses:  Atypical chest pain     MEDICATIONS GIVEN DURING THIS VISIT:  Medications  acetaminophen (TYLENOL) tablet 1,000 mg (1,000 mg Oral Given 09/11/18 1950)  ondansetron (ZOFRAN) injection 4 mg (4 mg Intravenous Given 09/11/18 1957)  sodium chloride 0.9 % bolus 500 mL (0 mLs Intravenous Stopped 09/11/18 2332)  ketorolac (TORADOL) 30 MG/ML injection 30 mg (30 mg Intravenous Given 09/11/18 2026)  iopamidol (ISOVUE-370) 76 % injection (100 mLs  Contrast Given 09/11/18 2223)     NEW OUTPATIENT MEDICATIONS STARTED DURING THIS VISIT:  Discharge Medication List as of 09/11/2018 11:47 PM    START taking these medications   Details  ibuprofen (ADVIL,MOTRIN) 800 MG tablet Take 1 tablet (800 mg total) by mouth every 6 (six) hours as needed., Starting Mon 09/11/2018, Print        Note:  This document was prepared using Conservation officer, historic buildings  and may include unintentional dictation errors.  Alona Bene, MD Emergency Medicine    Annaleigh Steinmeyer, Arlyss Repress, MD 09/12/18 [phone]

## 2018-09-11 NOTE — ED Notes (Signed)
Pt ambulatory to bathroom

## 2018-09-11 NOTE — Discharge Instructions (Addendum)
Return here as needed. Follow up with your doctor for a recheck. Your CT scan did not show any signs of Blood clots

## 2018-09-11 NOTE — ED Notes (Signed)
Patient transported to CT 

## 2018-09-11 NOTE — ED Notes (Signed)
Patient transported to X-ray 

## 2018-09-11 NOTE — ED Triage Notes (Signed)
Per [facility], pt from work with a c/c of chest pain and SOB for one day. CP is located in the center of the chest and does not radiate. Additional complaints of dizziness, lightheadedness, weakness, and N/V/D. Two episodes of vomiting today and two episodes of diarrhea yesterday.    Given by EMS: 324 mg of ASA 0.4 mg of Nitro

## 2018-09-14 ENCOUNTER — Telehealth: Payer: Self-pay | Admitting: Licensed Clinical Social Worker

## 2018-09-14 ENCOUNTER — Encounter (HOSPITAL_COMMUNITY): Payer: Self-pay | Admitting: *Deleted

## 2018-09-14 NOTE — Telephone Encounter (Signed)
Patient called requesting a letter stating HF diagnosis for assistance at [facility] for her power bill. CSW assisted by Meredith Staggers, RN and letter fax per request. Patient reminded of upcoming appointment to clinic. [name], LCSW, CCSW-MCS [phone]

## 2018-09-19 ENCOUNTER — Ambulatory Visit (INDEPENDENT_AMBULATORY_CARE_PROVIDER_SITE_OTHER): Payer: Medicaid Other

## 2018-09-19 DIAGNOSIS — Z23 Encounter for immunization: Secondary | ICD-10-CM | POA: Diagnosis not present

## 2018-09-19 DIAGNOSIS — Z111 Encounter for screening for respiratory tuberculosis: Secondary | ICD-10-CM

## 2018-09-19 NOTE — Progress Notes (Signed)
Pt presents in nurse clinic for TB skin test and Flu vaccine. Tb skin test applied left forearm, wheel present. Flu injection given LD, site unremarkable. Reminder card given to patient to return 10/3 @930am  for TB site read.

## 2018-09-21 ENCOUNTER — Ambulatory Visit: Payer: Medicaid Other
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
## 2018-09-25 ENCOUNTER — Encounter (HOSPITAL_COMMUNITY): Payer: Self-pay | Admitting: Family Medicine

## 2018-09-25 ENCOUNTER — Inpatient Hospital Stay (HOSPITAL_COMMUNITY)
Admission: AD | Admit: 2018-09-25 | Discharge: 2018-09-28 | DRG: 643 | Disposition: A | Discharge status: Home / Self Care | Payer: Medicaid Other | Source: Ambulatory Visit | Attending: Family Medicine | Admitting: Family Medicine

## 2018-09-25 ENCOUNTER — Encounter (HOSPITAL_COMMUNITY): Payer: Self-pay

## 2018-09-25 ENCOUNTER — Ambulatory Visit (HOSPITAL_BASED_OUTPATIENT_CLINIC_OR_DEPARTMENT_OTHER)
Admission: RE | Admit: 2018-09-25 | Discharge: 2018-09-25 | Disposition: A | Discharge status: Home / Self Care | Payer: Medicaid Other | Source: Ambulatory Visit | Attending: Internal Medicine | Admitting: Internal Medicine

## 2018-09-25 ENCOUNTER — Inpatient Hospital Stay (HOSPITAL_COMMUNITY): Payer: Medicaid Other

## 2018-09-25 ENCOUNTER — Other Ambulatory Visit: Payer: Self-pay

## 2018-09-25 VITALS — BP 142/86 | HR 147 | Wt 240.4 lb

## 2018-09-25 DIAGNOSIS — I252 Old myocardial infarction: Secondary | ICD-10-CM

## 2018-09-25 DIAGNOSIS — I5032 Chronic diastolic (congestive) heart failure: Secondary | ICD-10-CM | POA: Diagnosis not present

## 2018-09-25 DIAGNOSIS — E876 Hypokalemia: Secondary | ICD-10-CM | POA: Diagnosis present

## 2018-09-25 DIAGNOSIS — Z9119 Patient's noncompliance with other medical treatment and regimen: Secondary | ICD-10-CM

## 2018-09-25 DIAGNOSIS — I5033 Acute on chronic diastolic (congestive) heart failure: Secondary | ICD-10-CM | POA: Diagnosis present

## 2018-09-25 DIAGNOSIS — Z91013 Allergy to seafood: Secondary | ICD-10-CM

## 2018-09-25 DIAGNOSIS — I420 Dilated cardiomyopathy: Secondary | ICD-10-CM | POA: Diagnosis present

## 2018-09-25 DIAGNOSIS — Z8249 Family history of ischemic heart disease and other diseases of the circulatory system: Secondary | ICD-10-CM | POA: Diagnosis not present

## 2018-09-25 DIAGNOSIS — Z6838 Body mass index (BMI) 38.0-38.9, adult: Secondary | ICD-10-CM

## 2018-09-25 DIAGNOSIS — Z79899 Other long term (current) drug therapy: Secondary | ICD-10-CM

## 2018-09-25 DIAGNOSIS — I340 Nonrheumatic mitral (valve) insufficiency: Secondary | ICD-10-CM | POA: Diagnosis not present

## 2018-09-25 DIAGNOSIS — I5031 Acute diastolic (congestive) heart failure: Secondary | ICD-10-CM

## 2018-09-25 DIAGNOSIS — R51 Headache: Secondary | ICD-10-CM | POA: Diagnosis present

## 2018-09-25 DIAGNOSIS — D509 Iron deficiency anemia, unspecified: Secondary | ICD-10-CM | POA: Diagnosis present

## 2018-09-25 DIAGNOSIS — E0501 Thyrotoxicosis with diffuse goiter with thyrotoxic crisis or storm: Principal | ICD-10-CM | POA: Diagnosis present

## 2018-09-25 DIAGNOSIS — E0581 Other thyrotoxicosis with thyrotoxic crisis or storm: Secondary | ICD-10-CM | POA: Diagnosis not present

## 2018-09-25 DIAGNOSIS — Z9114 Patient's other noncompliance with medication regimen: Secondary | ICD-10-CM | POA: Diagnosis not present

## 2018-09-25 DIAGNOSIS — E119 Type 2 diabetes mellitus without complications: Secondary | ICD-10-CM | POA: Diagnosis present

## 2018-09-25 DIAGNOSIS — R5383 Other fatigue: Secondary | ICD-10-CM

## 2018-09-25 DIAGNOSIS — E0591 Thyrotoxicosis, unspecified with thyrotoxic crisis or storm: Secondary | ICD-10-CM

## 2018-09-25 DIAGNOSIS — E320 Persistent hyperplasia of thymus: Secondary | ICD-10-CM | POA: Diagnosis present

## 2018-09-25 DIAGNOSIS — R000 Tachycardia, unspecified: Secondary | ICD-10-CM

## 2018-09-25 DIAGNOSIS — R0602 Shortness of breath: Secondary | ICD-10-CM

## 2018-09-25 DIAGNOSIS — I110 Hypertensive heart disease with heart failure: Secondary | ICD-10-CM | POA: Diagnosis present

## 2018-09-25 DIAGNOSIS — I483 Typical atrial flutter: Secondary | ICD-10-CM | POA: Diagnosis not present

## 2018-09-25 DIAGNOSIS — I351 Nonrheumatic aortic (valve) insufficiency: Secondary | ICD-10-CM | POA: Diagnosis not present

## 2018-09-25 DIAGNOSIS — H538 Other visual disturbances: Secondary | ICD-10-CM | POA: Diagnosis present

## 2018-09-25 DIAGNOSIS — I4892 Unspecified atrial flutter: Secondary | ICD-10-CM | POA: Diagnosis present

## 2018-09-25 DIAGNOSIS — I4891 Unspecified atrial fibrillation: Secondary | ICD-10-CM | POA: Diagnosis present

## 2018-09-25 DIAGNOSIS — I272 Pulmonary hypertension, unspecified: Secondary | ICD-10-CM | POA: Diagnosis present

## 2018-09-25 DIAGNOSIS — E0500 Thyrotoxicosis with diffuse goiter without thyrotoxic crisis or storm: Secondary | ICD-10-CM | POA: Diagnosis not present

## 2018-09-25 DIAGNOSIS — Z91041 Radiographic dye allergy status: Secondary | ICD-10-CM

## 2018-09-25 DIAGNOSIS — F4321 Adjustment disorder with depressed mood: Secondary | ICD-10-CM | POA: Diagnosis present

## 2018-09-25 DIAGNOSIS — R634 Abnormal weight loss: Secondary | ICD-10-CM

## 2018-09-25 DIAGNOSIS — Z91040 Latex allergy status: Secondary | ICD-10-CM

## 2018-09-25 DIAGNOSIS — I10 Essential (primary) hypertension: Secondary | ICD-10-CM | POA: Diagnosis not present

## 2018-09-25 LAB — COMPREHENSIVE METABOLIC PANEL
ALBUMIN: 3 g/dL — AB (ref 3.5–5.0)
ALT: 18 U/L (ref 0–44)
ANION GAP: 8 (ref 5–15)
AST: 23 U/L (ref 15–41)
Alkaline Phosphatase: 112 U/L (ref 38–126)
BUN: 8 mg/dL (ref 6–20)
CALCIUM: 9.2 mg/dL (ref 8.9–10.3)
CO2: 25 mmol/L (ref 22–32)
Chloride: 103 mmol/L (ref 98–111)
Creatinine, Ser: 0.38 mg/dL — ABNORMAL LOW (ref 0.44–1.00)
GFR calc non Af Amer: >60 mL/min (ref >60)
Glucose, Bld: 123 mg/dL — ABNORMAL HIGH (ref 70–99)
POTASSIUM: 3.3 mmol/L — AB (ref 3.5–5.1)
SODIUM: 136 mmol/L (ref 135–145)
Total Bilirubin: 0.7 mg/dL (ref 0.3–1.2)
Total Protein: 6.8 g/dL (ref 6.5–8.1)

## 2018-09-25 LAB — URINALYSIS, ROUTINE W REFLEX MICROSCOPIC
Bilirubin Urine: NEGATIVE
GLUCOSE, UA: NEGATIVE mg/dL
Hgb urine dipstick: NEGATIVE
KETONES UR: NEGATIVE mg/dL
Nitrite: NEGATIVE
PH: 6 (ref 5.0–8.0)
Protein, ur: NEGATIVE mg/dL
SPECIFIC GRAVITY, URINE: 1.006 (ref 1.005–1.030)

## 2018-09-25 LAB — CBC WITH DIFFERENTIAL/PLATELET
ABS IMMATURE GRANULOCYTES: 0 10*3/uL (ref 0.0–0.1)
BASOS ABS: 0 10*3/uL (ref 0.0–0.1)
BASOS PCT: 0 %
Eosinophils Absolute: 0.1 10*3/uL (ref 0.0–0.7)
Eosinophils Relative: 1 %
HCT: 37.2 % (ref 36.0–46.0)
HEMOGLOBIN: 11.3 g/dL — AB (ref 12.0–15.0)
IMMATURE GRANULOCYTES: 0 %
LYMPHS PCT: 30 %
Lymphs Abs: 2 10*3/uL (ref 0.7–4.0)
MCH: 21.7 pg — ABNORMAL LOW (ref 26.0–34.0)
MCHC: 30.4 g/dL (ref 30.0–36.0)
MCV: 71.5 fL — AB (ref 78.0–100.0)
MONOS PCT: 9 %
Monocytes Absolute: 0.6 10*3/uL (ref 0.1–1.0)
NEUTROS ABS: 4 10*3/uL (ref 1.7–7.7)
NEUTROS PCT: 60 %
PLATELETS: 247 10*3/uL (ref 150–400)
RBC: 5.2 MIL/uL — ABNORMAL HIGH (ref 3.87–5.11)
RDW: 14.8 % (ref 11.5–15.5)
WBC: 6.7 10*3/uL (ref 4.0–10.5)

## 2018-09-25 LAB — HEMOGLOBIN A1C
HEMOGLOBIN A1C: 5.5 % (ref 4.8–5.6)
MEAN PLASMA GLUCOSE: 111.15 mg/dL

## 2018-09-25 LAB — T4, FREE: Free T4: >5.5 ng/dL — ABNORMAL HIGH (ref 0.82–1.77)

## 2018-09-25 LAB — BASIC METABOLIC PANEL
Anion gap: 8 (ref 5–15)
BUN: 7 mg/dL (ref 6–20)
CHLORIDE: 101 mmol/L (ref 98–111)
CO2: 26 mmol/L (ref 22–32)
CREATININE: 0.41 mg/dL — AB (ref 0.44–1.00)
Calcium: 9.5 mg/dL (ref 8.9–10.3)
GFR calc non Af Amer: >60 mL/min (ref >60)
Glucose, Bld: 113 mg/dL — ABNORMAL HIGH (ref 70–99)
POTASSIUM: 3.5 mmol/L (ref 3.5–5.1)
SODIUM: 135 mmol/L (ref 135–145)

## 2018-09-25 LAB — BRAIN NATRIURETIC PEPTIDE: B NATRIURETIC PEPTIDE 5: 69.1 pg/mL (ref 0.0–100.0)

## 2018-09-25 LAB — TROPONIN I: Troponin I: <0.03 ng/mL (ref <0.03)

## 2018-09-25 LAB — TSH

## 2018-09-25 LAB — MRSA PCR SCREENING: MRSA BY PCR: NEGATIVE

## 2018-09-25 MED ORDER — BENZOCAINE 10 % MT GEL
Freq: Two times a day (BID) | OROMUCOSAL | Status: DC | PRN
  Administered 2018-09-25: 22:00:00 via OROMUCOSAL
  Filled 2018-09-25: qty 9

## 2018-09-25 MED ORDER — APIXABAN 5 MG PO TABS
5.0000 mg | ORAL_TABLET | Freq: Two times a day (BID) | ORAL | Status: DC
  Administered 2018-09-25 – 2018-09-28 (×7): 5 mg via ORAL
  Filled 2018-09-25 (×7): qty 1

## 2018-09-25 MED ORDER — PROPRANOLOL HCL 60 MG PO TABS
60.0000 mg | ORAL_TABLET | Freq: Three times a day (TID) | ORAL | Status: DC
  Administered 2018-09-25 – 2018-09-26 (×5): 60 mg via ORAL
  Filled 2018-09-25 (×2): qty 6
  Filled 2018-09-25 (×2): qty 1
  Filled 2018-09-25: qty 6
  Filled 2018-09-25 (×2): qty 1

## 2018-09-25 MED ORDER — ENOXAPARIN SODIUM 40 MG/0.4ML ~~LOC~~ SOLN
40.0000 mg | SUBCUTANEOUS | Status: DC
  Administered 2018-09-25: 40 mg via SUBCUTANEOUS
  Filled 2018-09-25: qty 0.4

## 2018-09-25 MED ORDER — ACETAMINOPHEN 325 MG PO TABS
650.0000 mg | ORAL_TABLET | Freq: Four times a day (QID) | ORAL | Status: DC | PRN
  Administered 2018-09-25 – 2018-09-27 (×3): 650 mg via ORAL
  Filled 2018-09-25 (×3): qty 2

## 2018-09-25 MED ORDER — POTASSIUM CHLORIDE CRYS ER 20 MEQ PO TBCR
40.0000 meq | EXTENDED_RELEASE_TABLET | Freq: Two times a day (BID) | ORAL | Status: DC
  Administered 2018-09-25 – 2018-09-28 (×7): 40 meq via ORAL
  Filled 2018-09-25 (×7): qty 2

## 2018-09-25 MED ORDER — PROPRANOLOL HCL 10 MG PO TABS: 60.0000 mg | ORAL_TABLET | Freq: Three times a day (TID) | ORAL | Status: DC

## 2018-09-25 MED ORDER — ACETAMINOPHEN 650 MG RE SUPP: 650.0000 mg | Freq: Four times a day (QID) | RECTAL | Status: DC | PRN

## 2018-09-25 MED ORDER — METHIMAZOLE 10 MG PO TABS
20.0000 mg | ORAL_TABLET | Freq: Four times a day (QID) | ORAL | Status: DC
  Administered 2018-09-25 – 2018-09-28 (×12): 20 mg via ORAL
  Filled 2018-09-25 (×15): qty 2

## 2018-09-25 MED ORDER — FUROSEMIDE 10 MG/ML IJ SOLN
40.0000 mg | Freq: Two times a day (BID) | INTRAMUSCULAR | Status: DC
  Administered 2018-09-25 – 2018-09-26 (×3): 40 mg via INTRAVENOUS
  Filled 2018-09-25 (×3): qty 4

## 2018-09-25 MED ORDER — ONDANSETRON HCL 4 MG/2ML IJ SOLN: 4.0000 mg | Freq: Four times a day (QID) | INTRAMUSCULAR | Status: DC | PRN

## 2018-09-25 MED ORDER — ONDANSETRON HCL 4 MG PO TABS: 4.0000 mg | ORAL_TABLET | Freq: Four times a day (QID) | ORAL | Status: DC | PRN

## 2018-09-25 NOTE — Consult Note (Addendum)
Advanced Heart Failure Team Consult Note   Primary Physician: Garth Bigness, MD PCP-Cardiologist: Dr [name]  HPI:    Tara [name] is seen today for evaluation of heart failure  at the request of Dr Deirdre Priest.   Tara [name] is a 27 year old with a history of untreated graves disease, diastolic heart failure, HTN, pregnancy complicated by pre-eclampsia, and SVT.   Admitted 2/8 -> 01/28/17 with SVT. Thought likely secondary to her poorly controlled Grave's disease. Had been out of her thyroid and BP medications for 2-3 weeks.  Admitted 6/19-6/21/18 with SOB, hyperthyroidism (had not been taking methimazole). Hospital course complicated as she was pregnant and required therapeutic abortion. She was transferred to another facility for TA. Also had right heart cath done. Results below.   Admitted 06/2018 with increased shortness of breath. Diuresed with IV lasix. TSH < 0.010 and Free T4 >5.5. She had not been taking methimazole due to cost.   Evaluated in the ED 09/11/2018 with chest pain. CTA was negative and cardiac makers ok so she was discharged home.   Today she was seen in the HF clinic and noted to be in A flutter. She had been 1/2 of her recommended dose of methimazole. She has had 20 pound weight loss over the last 2 months. Complaining of fatigue and shortness of breath with activity. Appetite poor.   Echo 06/2018  Left ventricle: The cavity size was normal. Wall thickness was   increased in a pattern of mild LVH. Systolic function was normal.   The estimated ejection fraction was in the range of 60% to 65%.   Wall motion was normal; there were no regional wall motion   abnormalities. - Mitral valve: Mild prolapse, involving the anterior leaflet.   There was mild regurgitation. - Right atrium: The atrium was mildly dilated. - Pulmonary arteries: Systolic pressure was moderately to severely   increased. PA peak pressure: 62 mm Hg (S).  Review of Systems: [y] = yes, [ ]  =  no   General: Weight gain [ ] ; Weight loss [Y ]; Anorexia [ ] ; Fatigue [ Y]; Fever [ ] ; Chills [ ] ; Weakness [Y ]  Cardiac: Chest pain/pressure [ ] ; Resting SOB [ ] ; Exertional SOB [Y ]; Orthopnea [ ] ; Pedal Edema [ ] ; Palpitations [ ] ; Syncope [ ] ; Presyncope [ ] ; Paroxysmal nocturnal dyspnea[ ]   Pulmonary: Cough [ ] ; Wheezing[ ] ; Hemoptysis[ ] ; Sputum [ ] ; Snoring [ ]   GI: Vomiting[ ] ; Dysphagia[ ] ; Melena[ ] ; Hematochezia [ ] ; Heartburn[ ] ; Abdominal pain [ ] ; Constipation [ ] ; Diarrhea [ ] ; BRBPR [ ]   GU: Hematuria[ ] ; Dysuria [ ] ; Nocturia[ ]   Vascular: Pain in legs with walking [ ] ; Pain in feet with lying flat [ ] ; Non-healing sores [ ] ; Stroke [ ] ; TIA [ ] ; Slurred speech [ ] ;  Neuro: Headaches[ ] ; Vertigo[ ] ; Seizures[ ] ; Paresthesias[ ] ;Blurred vision [ ] ; Diplopia [ ] ; Vision changes [ ]   Ortho/Skin: Arthritis [ ] ; Joint pain [ ] ; Muscle pain [ ] ; Joint swelling [ ] ; Back Pain [Y ]; Rash [ ]   Psych: Depression[ Y]; Anxiety[Y ]  Heme: Bleeding problems [ ] ; Clotting disorders [ ] ; Anemia [ ]   Endocrine: Diabetes [ ] ; Thyroid dysfunction[Y ]  Home Medications Prior to Admission medications   Medication Sig Start Date End Date Taking? Authorizing Provider  acetaminophen (TYLENOL) 500 MG tablet Take 500-1,000 mg by mouth every 6 (six) hours as needed (for pain or headaches).    [provider]  diphenhydramine-acetaminophen (TYLENOL PM) 25-500 MG TABS tablet Take 2 tablets by mouth at bedtime as needed (pain/sleep).    [provider]  furosemide (LASIX) 40 MG tablet TAKE 1 TABLET BY MOUTH DAILY 08/22/18   Garth Bigness, MD  ibuprofen (ADVIL,MOTRIN) 800 MG tablet Take 1 tablet (800 mg total) by mouth every 6 (six) hours as needed. Patient not taking: Reported on 09/25/2018 09/11/18   Charlestine Night, PA-C  labetalol (NORMODYNE) 300 MG tablet Take 2 tablets (600 mg total) by mouth 2 (two) times daily. 07/18/18   Meccariello, Solmon Ice, DO  methimazole (TAPAZOLE) 10  MG tablet Take 4 tablets (40 mg total) by mouth 2 (two) times daily. Patient taking differently: Take 40 mg by mouth daily.  07/18/18   Meccariello, Solmon Ice, DO  metolazone (ZAROXOLYN) 2.5 MG tablet Take 1 tablet (2.5 mg total) by mouth daily as needed (for increased weight or swelling). Patient not taking: Reported on 09/25/2018 07/18/18   Meccariello, Solmon Ice, DO  Potassium Chloride ER 20 MEQ TBCR Take 40 mEq by mouth daily as needed (with every dose of metolazone taken). Patient not taking: Reported on 09/25/2018 07/18/18   Meccariello, Solmon Ice, DO    Past Medical History: Past Medical History:  Diagnosis Date  . Anemia   . Anxiety   . CAP (community acquired pneumonia) 02/13/2016  . CHF (congestive heart failure) (HCC)   . Chronic bronchitis (HCC)   . Diabetes mellitus without complication (HCC)   . Dyspnea    with exertion  . Graves disease   . Graves disease   . Headache   . Hypertension   . Myocardial infarction (HCC) 2013  . Obesity   . Pneumonia "several times"  . Seasonal allergies     Past Surgical History: Past Surgical History:  Procedure Laterality Date  . CESAREAN SECTION  2008, 2013   x 2  . CESAREAN SECTION N/A 07/21/2016   Procedure: CESAREAN SECTION;  Surgeon: Lesly Dukes, MD;  Location: [facility] SU[facility];  Service: Obstetrics;  Laterality: N/A;  . EYE SURGERY Bilateral 2014   "for Graves Disease; eyelid retraction on left; decompression on the right""    Family History: Family History  Problem Relation Age of Onset  . Hypertension Mother     Social History: Social History   Socioeconomic History  . Marital status: Single    Spouse name: Not on file  . Number of children: 3  . Years of education: 11th - GED  . Highest education level: GED or equivalent  Occupational History  . 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 Financial resource strain: Very hard  . Food insecurity:    Worry: Often true    Inability: Often true  .  Transportation needs:    Medical: Yes    Non-medical: Yes  Tobacco Use  . Smoking status: Never Smoker  . Smokeless tobacco: Never Used  Substance and Sexual Activity  . Alcohol use: No  . Drug use: No  . Sexual activity: Not Currently    Comment: approx 13 - [redacted] wks gestation  Lifestyle  . Physical activity:    Days per week: Not on file    Minutes per session: Not on file  . Stress: Not on file  Relationships  . Social connections:    Talks on phone: Not on file    Gets together: Not on file    Attends religious service: Not on file    Active member  of club or organization: Not on file    Attends meetings of clubs or organizations: Not on file    Relationship status: Not on file  Other Topics Concern  . Not on file  Social History Narrative  . Not on file    Allergies:  Allergies  Allergen Reactions  . Iodine Anaphylaxis, Hives and Swelling    Throat swells  . Shellfish Allergy Anaphylaxis and Hives    Throat swells  . Latex Rash  . Tape Rash    NO PAPER TAPE!!!    Objective:    Vital Signs:   Temp:  [97.6 F (36.4 C)] 97.6 F (36.4 C) (10/07 1120) Pulse Rate:  [145] 145 (10/07 1120) Resp:  [19] 19 (10/07 1120) BP: (129)/(82) 129/82 (10/07 1120) SpO2:  [90 %] 90 % (10/07 1120)    Weight change: There were no vitals filed for this visit.  Intake/Output:  No intake or output data in the 24 hours ending 09/25/18 1207    Physical Exam    General:  Tearful . No resp difficulty HEENT: normal Neck: supple. JVP 9-10 . Carotids 2+ bilat; no bruits. No lymphadenopathy. + thyromegaly  Cor: PMI nondisplaced. Tachy Regular rate & rhythm. No rubs, gallops or murmurs. Lungs: clear Abdomen: soft, nontender, nondistended. No hepatosplenomegaly. No bruits or masses. Good bowel sounds. Extremities: no cyanosis, clubbing, rash, edema Neuro: alert & orientedx3, cranial nerves grossly intact. moves all 4 extremities w/o difficulty. Affect pleasant   Telemetry    aflutter 140s   EKG     a flutter 147 bpm   Labs   Basic Metabolic Panel: Recent Labs  Lab 09/25/18 0851  NA 135  K 3.5  CL 101  CO2 26  GLUCOSE 113*  BUN 7  CREATININE 0.41*  CALCIUM 9.5    Liver Function Tests: No results for input(s): AST, ALT, ALKPHOS, BILITOT, PROT, ALBUMIN in the last 168 hours. No results for input(s): LIPASE, AMYLASE in the last 168 hours. No results for input(s): AMMONIA in the last 168 hours.  CBC: No results for input(s): WBC, NEUTROABS, HGB, HCT, MCV, PLT in the last 168 hours.  Cardiac Enzymes: No results for input(s): CKTOTAL, CKMB, CKMBINDEX, TROPONINI in the last 168 hours.  BNP: BNP (last 3 results) Recent Labs    07/18/18 0222 09/11/18 1918  BNP 164.9* 142.6*    ProBNP (last 3 results) No results for input(s): PROBNP in the last 8760 hours.   CBG: No results for input(s): GLUCAP in the last 168 hours.  Coagulation Studies: No results for input(s): LABPROT, INR in the last 72 hours.   Imaging    No results found.   Medications:     Current Medications:    Infusions:      Patient Profile   Tara [name] is a 27 year old with a history of untreated graves disease, diastolic heart failure, HTN, pregnancy complicated by pre-eclampsia, and SVT.   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    Assessment/Plan   1. 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 She has untreated Graves disease. Was on methimazole but only taking half the recommended dose.  She has not had endocrinology follow up in the community.   2. New Onset A flutter RVR Will need cardioversion once thyroid better controlled unless she decompensates.  Start eliquis 5 mg twice a day.  -Would not use amiodarone due to hyperthyroidism. I   3. A/C Diastolic Heart Failure  ECHO 06/2018 EF ~60%.  Diuresed with 40 mg IV lasix twice daily  Follow renal function closely.   4. HTN  Per Primary placed on propanalol.   5.  Weight loss In the setting of hyperthyroidism   6. Social  Noncompliance due to poor socio-economics. She has intermittent insurance. When she works she loses Medicaid. Has transportation and food issues.     Medication concerns reviewed with patient and pharmacy team. Barriers identified: see above.   Length of Stay: 0  Tonye Becket, NP  09/25/2018, 12:07 PM  Advanced Heart Failure Team Pager [phone] (M-F; 7a - 4p)  Please contact [facility] for night-coverage after hours (4p -7a ) and weekends on [email or web address]  Patient seen and examined with Tonye Becket, NP. We discussed all aspects of the encounter. I agree with the assessment and plan as stated above.   27 y/o woman with Graves disease presents to clinic today with weight loss, fatigue and new-onset atrial flutter. She has not been taking her thyroid meds. Concern is for thyroid storm. Needs to be admitted. Start b-blocker and PTU and anticoagulation. Once thyroid under control will need TEE/DC-CV. Would not use amiodarone as can exacerbate thyroid storm.   Arvilla Meres, MD  6:57 PM

## 2018-09-25 NOTE — Progress Notes (Signed)
Pt arrived as a direct admit from the heart failure clinic at [facility].  HR was 150's appears as flutter then changed to ST and ranged from 130-140's. Other VS stable. Oriented to the unit and routine. Call bell was put at bedside.  CMT was notified of arrival. Will continue to monitor.

## 2018-09-25 NOTE — Progress Notes (Signed)
Family medicine teaching service progress note  Late entry.  Attempted to contact patient's outpatient endocrinologist Dr. [name].  Was connected to answering service.  Unfortunately got voicemail on 2 occasions.  Left cell phone is contact number.  We will attempt to contact Dr. Jiles Crocker line on 09/26/2018.  Myrene Buddy MD PGY-2 Family Medicine Resident

## 2018-09-25 NOTE — Progress Notes (Addendum)
CSW met with patient in the clinic. She is well known to CSW from previous encounters in the clinic. Patient identified multiple social determinants impacting her health. Patient states challenges with food although reports she has food stamps and anticipated her card will be loaded for the month on the 11th. 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 CSW assisted with transportation to the clinic this morning although she has ongoing issues with transportation. She mostly uses the bus to get to and from work and food stores.  She spoke of her relationship with her youngest child's father and although he still is involved she has asked him to leave their joint housing. She was successful in removing him from the lease and now the apartment is solely in her name.  Patient's heart rate is high today in the clinic and she reports ongoing issues with her thyroid and inability to purchase medications due to cost. Patient will be admitted for further intervention. CSW will follow up with patient outpatient to assist with navigating the community resources to address some of her social determinants post hospitalization. Patient has CSW number and will contact post discharge. [name], [place], [place]

## 2018-09-25 NOTE — H&P (Signed)
Family Medicine Teaching [facility] Admission History and Physical Service Pager: [phone]  Patient name: Tara [name] Medical record number: [id number] Date of birth: July 25, 1991 Age: 27 y.o. Gender: female  Primary Care Provider: Garth Bigness, MD Consultants: Cardiology, endocrinology Code Status: Full  Chief Complaint: Shortness of breath, palpitations, vomiting  Assessment and Plan: Tara [name] is a 27 y.o. female with a past medical history significant for hypertension, Graves' disease, diastolic heart failure who presents today with increasing shortness of breath, palpitations found to be in atrial flutter in the setting of thyroid crisis.  #Palpitations, chronic, worsening Patient presents today complaining of palpitation, vomiting, headache, blurry vision for the past 2 months.  Patient reports that she has been taking a decreased dose of her methimazole.  TSH checked on 9/23 was <0.001.  On exam today patient found to be in atrial flutter likely in the setting of thyroid crisis secondary to uncontrolled Graves' disease due to inadequate treatment.  Patient has been evaluated by cardiology who plan on cardioverting once patient is more stable.  Patient will need rate control as well as restarting of methimazole at correct dose. --Admit to FMTS, admitting physician Dr. Deirdre Priest. --Admit to stepdown unit --Follow-up on cardiology consult, appreciate recs --Start patient on propranolol 60 mg 3 times daily, decrease as needed based on blood pressure and heart rate --Consult endocrinology (Dr. [name]) --Follow-up on CBC, CMP, HIV, A1c --Follow-up on UA and chest x-ray --Acetaminophen 650 mg every 6 as needed --Zofran 4 mg every 6 as needed  #Graves' disease, uncontrolled Patient presents with palpitations, headache, shortness of breath, as well as proptosis and mild volume overload.  She has been taking methimazole at less than half of her normal dose.  Most of the  symptoms patient is describing a secondary to uncontrolled thyroid disease.  Patient has not followed up with endocrinology due to lack of insurance.  Patient last TSH 3 weeks ago was <0.001. --We will consult endocrinology --Start patient on propanolol 60 mg 3 times daily adjust based on vitals --Start patient on methimazole 20 mg daily --Discussed with endocrinology the merits of corticosteroids as well as a transfusion to prevent conversion --Follow-up on TSH, free T4, T3 --Follow-up on EKG  #Diastolic heart failure Patient presents with atrial flutter and found to be mildly volume overloaded in the setting of thyroid crisis.  She reports taking Lasix 20 mg daily as prescribed.  Patient denies any orthopnea or PND but does endorse worsening shortness of breath having to stop to catch her breath while ambulating.  Recent echo on 06/2008 showed an EF 60-65% with elevated pulmonary artery pressure of 62 mm Hg.  On exam patient has mild crackles at the bases bilaterally but no lower extremity edema noted.  Suspect that shortness of breath is described by patient likely secondary to elevated RVSP in the setting of possible undiagnosed pulmonary hypertension. --Start patient on furosemide 40 mg IV per cardiology --Strict I's and O's --Daily weights --Referred for cardiology consult, appreciate recs. --Could benefit from repeat echo discussed with cardiology  #Hypertension Blood pressure on admission to the floor was 129/82.  Blood pressure was reported to be elevated in the hospital in clinic this morning.  Patient reports adherence to labetalol but did not take medication this morning.  Patient will be started on propanolol for type crisis and Lasix for mild volume overload. --Continue propanolol 60 mg 3 times daily --Monitor blood pressure closely   FEN/GI: Heart healthy diet Prophylaxis: Lovenox  Disposition: Admit  to stepdown unit pending cardiology and endocrinology work-up  History of  Present Illness:  Tara [name] is a 27 y.o. female for past medical history significant for hypertension, Graves' disease, diastolic heart failure who presented today to heart failure clinic in thyroid crisis.  Patient was being seen in the clinic when she was found to be in atrial flutter with a heart rate in the 150s.  Patient also had elevated blood pressure.  Patient was noted to be in mild state of volume overload.  Patient reports that she has been feeling bad for the past 3 months.  She reports palpitations, vomiting for the past 2 months, worsening shortness of breath, sleep on weight loss, fatigue, intermittent headaches as well as blurry vision.  Patient reports that she has been taking her 2 tabs of methimazole a day.  Patient reports that prior to March she was taking it every 8 hours.  Patient has not been able to afford medication.  Reports taking her labetalol for blood pressure as well as her Lasix. Patient has not followed up with endocrinology since last hospitalization over a year ago.  She reports that she has been unable to do so due to his insurance issue.  She was previously seen by Dr. [name].  Patient was recently (9/23) seen in the ED for headaches, at that time TSH <0.001.  Patient also had mild elevated BNP at 142.6.   Review Of Systems: Per HPI with the following additions:   Review of Systems  Constitutional: Positive for malaise/fatigue and weight loss.  Eyes: Positive for blurred vision.  Respiratory: Positive for shortness of breath. Negative for wheezing.   Cardiovascular: Positive for palpitations. Negative for orthopnea, leg swelling and PND.  Gastrointestinal: Positive for vomiting. Negative for abdominal pain and diarrhea.  Neurological: Positive for headaches. Negative for tremors.    Patient Active Problem List   Diagnosis Date Noted  . Thyroid crisis or storm 09/25/2018  . Acute diastolic heart failure (HCC)   . CHF exacerbation (HCC) 07/18/2018  . Skin  lesion of right leg 07/22/2017  . Depression 06/23/2017  . Thyroid disease   . Chronic diastolic heart failure (HCC) 03/08/2017  . Birth control counseling 01/31/2017  . Dilated cardiomyopathy (HCC)   . Pulmonary hypertension (HCC)   . Shortness of breath   . Tachycardia with heart rate 100-120 beats per minute   . Hyperthyroidism   . Noncompliance with medication regimen   . Graves disease 02/11/2016  . HTN (hypertension) 02/11/2016  . Morbid obesity due to excess calories [facility])     Past Medical History: Past Medical History:  Diagnosis Date  . Anemia   . Anxiety   . CAP (community acquired pneumonia) 02/13/2016  . CHF (congestive heart failure) (HCC)   . Chronic bronchitis (HCC)   . Diabetes mellitus without complication (HCC)   . Dyspnea    with exertion  . Graves disease   . Graves disease   . Headache   . Hypertension   . Myocardial infarction (HCC) 2013  . Obesity   . Pneumonia "several times"  . Seasonal allergies     Past Surgical History: Past Surgical History:  Procedure Laterality Date  . CESAREAN SECTION  2008, 2013   x 2  . CESAREAN SECTION N/A 07/21/2016   Procedure: CESAREAN SECTION;  Surgeon: Lesly Dukes, MD;  Location: [facility] BIRTHING SUITES;  Service: Obstetrics;  Laterality: N/A;  . EYE SURGERY Bilateral 2014   "for Graves Disease; eyelid retraction on  left; decompression on the right""    Social History: Social History   Tobacco Use  . Smoking status: Never Smoker  . Smokeless tobacco: Never Used  Substance Use Topics  . Alcohol use: No  . Drug use: No   Additional social history:   Please also refer to relevant sections of EMR.  Family History: Family History  Problem Relation Age of Onset  . Hypertension Mother    (If not completed, MUST add something in)  Allergies and Medications: Allergies  Allergen Reactions  . Iodine Anaphylaxis, Hives and Swelling    Throat swells  . Shellfish Allergy Anaphylaxis and Hives    Throat  swells  . Latex Rash  . Tape Rash    NO PAPER TAPE!!!   No current facility-administered medications on file prior to encounter.    Current Outpatient Medications on File Prior to Encounter  Medication Sig Dispense Refill  . acetaminophen (TYLENOL) 500 MG tablet Take 500-1,000 mg by mouth every 6 (six) hours as needed (for pain or headaches).    . diphenhydramine-acetaminophen (TYLENOL PM) 25-500 MG TABS tablet Take 2 tablets by mouth at bedtime as needed (pain/sleep).    . furosemide (LASIX) 40 MG tablet TAKE 1 TABLET BY MOUTH DAILY 30 tablet 0  . ibuprofen (ADVIL,MOTRIN) 800 MG tablet Take 1 tablet (800 mg total) by mouth every 6 (six) hours as needed. (Patient not taking: Reported on 09/25/2018) 30 tablet 0  . labetalol (NORMODYNE) 300 MG tablet Take 2 tablets (600 mg total) by mouth 2 (two) times daily. 60 tablet 0  . methimazole (TAPAZOLE) 10 MG tablet Take 4 tablets (40 mg total) by mouth 2 (two) times daily. (Patient taking differently: Take 40 mg by mouth daily. ) 240 tablet 0  . metolazone (ZAROXOLYN) 2.5 MG tablet Take 1 tablet (2.5 mg total) by mouth daily as needed (for increased weight or swelling). (Patient not taking: Reported on 09/25/2018) 30 tablet 0  . Potassium Chloride ER 20 MEQ TBCR Take 40 mEq by mouth daily as needed (with every dose of metolazone taken). (Patient not taking: Reported on 09/25/2018) 60 tablet 0    Objective: BP 129/82 (BP Location: Right Arm)   Pulse (!) 145   Temp 97.6 F (36.4 C) (Oral)   Resp 19   LMP 09/06/2018   SpO2 90%  Physical exam: General: NAD, pleasant, able to participate in exam HEENT: Moist mucous membrane, no tongue fasciculations noted, proptosis bilaterally, left eye upper eyelid drooping Cardiac: Tachycardia, regular rhythm, JVD, normal heart sounds, no murmurs. 2+ radial and PT pulses bilaterally Respiratory: Mild crackles noted at the bases bilaterally, otherwise moving air clearly in upper lung fields bilaterally Abdomen:  soft, nontender, nondistended, no hepatic or splenomegaly, +BS Extremities: no lower extremity edema or cyanosis. WWP. Skin: warm and dry, no rashes noted Neuro: alert and oriented x4, no focal deficits, no tremor Psych: Normal affect and mood  Labs and Imaging: CBC BMET  No results for input(s): WBC, HGB, HCT, PLT in the last 168 hours. Recent Labs  Lab 09/25/18 0851  NA 135  K 3.5  CL 101  CO2 26  BUN 7  CREATININE 0.41*  GLUCOSE 113*  CALCIUM 9.5     Portable Chest 1 View  Result Date: 09/25/2018 CLINICAL DATA:  Shortness of breath ,hx htn,dm,chf,mi EXAM: PORTABLE CHEST 1 VIEW COMPARISON:  None 03/08/2018 FINDINGS: Heart size is accentuated by the AP portable technique. No focal consolidations or pleural effusions. No pulmonary edema. IMPRESSION: No active disease.  Electronically Signed   By: Norva Pavlov M.D.   On: 09/25/2018 12:39    Lovena Neighbours, MD 09/25/2018, 12:45 PM PGY-3, [facility] FPTS Intern pager: [phone], text pages welcome

## 2018-09-26 DIAGNOSIS — I483 Typical atrial flutter: Secondary | ICD-10-CM

## 2018-09-26 DIAGNOSIS — I4892 Unspecified atrial flutter: Secondary | ICD-10-CM

## 2018-09-26 DIAGNOSIS — I10 Essential (primary) hypertension: Secondary | ICD-10-CM

## 2018-09-26 LAB — CBC
HEMATOCRIT: 37.9 % (ref 36.0–46.0)
Hemoglobin: 11.3 g/dL — ABNORMAL LOW (ref 12.0–15.0)
MCH: 21.6 pg — AB (ref 26.0–34.0)
MCHC: 29.8 g/dL — AB (ref 30.0–36.0)
MCV: 72.3 fL — ABNORMAL LOW (ref 80.0–100.0)
Platelets: 237 10*3/uL (ref 150–400)
RBC: 5.24 MIL/uL — ABNORMAL HIGH (ref 3.87–5.11)
RDW: 14.6 % (ref 11.5–15.5)
WBC: 6.2 10*3/uL (ref 4.0–10.5)

## 2018-09-26 LAB — BASIC METABOLIC PANEL
Anion gap: 9 (ref 5–15)
BUN: 9 mg/dL (ref 6–20)
CALCIUM: 9.5 mg/dL (ref 8.9–10.3)
CHLORIDE: 104 mmol/L (ref 98–111)
CO2: 23 mmol/L (ref 22–32)
Creatinine, Ser: 0.37 mg/dL — ABNORMAL LOW (ref 0.44–1.00)
GFR calc non Af Amer: >60 mL/min (ref >60)
Glucose, Bld: 95 mg/dL (ref 70–99)
Potassium: 3.9 mmol/L (ref 3.5–5.1)
Sodium: 136 mmol/L (ref 135–145)

## 2018-09-26 LAB — IRON AND TIBC
IRON: 78 ug/dL (ref 28–170)
Saturation Ratios: 34 % — ABNORMAL HIGH (ref 10.4–31.8)
TIBC: 232 ug/dL — AB (ref 250–450)
UIBC: 154 ug/dL

## 2018-09-26 LAB — T3

## 2018-09-26 LAB — FERRITIN: Ferritin: 418 ng/mL — ABNORMAL HIGH (ref 11–307)

## 2018-09-26 LAB — HIV ANTIBODY (ROUTINE TESTING W REFLEX): HIV Screen 4th Generation wRfx: NONREACTIVE

## 2018-09-26 MED ORDER — PROPRANOLOL HCL 10 MG PO TABS
80.0000 mg | ORAL_TABLET | Freq: Three times a day (TID) | ORAL | Status: DC
Start: 2018-09-26 — End: 2018-09-27
  Administered 2018-09-26 – 2018-09-27 (×2): 80 mg via ORAL
  Filled 2018-09-26 (×2): qty 8

## 2018-09-26 MED ORDER — CHOLESTYRAMINE 4 G PO PACK
4.0000 g | PACK | Freq: Two times a day (BID) | ORAL | Status: DC
  Administered 2018-09-26 – 2018-09-28 (×4): 4 g via ORAL
  Filled 2018-09-26 (×4): qty 1

## 2018-09-26 NOTE — Consult Note (Addendum)
Reason for Consult: Hyperthyroidism   Referring Physician: Sela Hilding, MD  Tara [name] is an 27 y.o. female. With a history of diastolic congestive heart failure (Echo 7/19 EF 60-65%), Graves' disease, and hypertension who presented to the ED from the heart failure center due to elevated heart rate.  She was diagnosed with Graves' disease in 2011 when she had a thyroid storm and has had multiple admissions for hyperthyroidism.  She is currently prescribed methimazole 40 mg twice daily however she states she only takes 2 tablets (20 mg) once a day due to not being able to afford her medication.  She reports that she has been feeling very anxious, warm, excessively fatigued, diaphoretic, and having heat intolerance. She has also been having some shortness of breath with minimal exertion. She has had a weight loss of about 30 pounds over the past 2.5 months.  She had previously been on PTU but was switched to methimazole when she became pregnant in 2013.  She reports that she felt that the PTU helped control her symptoms.  She had been seen by Dr. Buddy Duty previously when she was hospitalized in 05/2017.  She had severe hyperthyroidism at that time and was treated with methimazole 20 mg 3 times daily, cholestyramine 4 g twice daily, labetalol 200 mg twice daily, and prednisone 10 mg once a day.  She had intended to follow-up on an outpatient basis to discuss management of her Berenice Primas' disease after discharge however had issues with her insurance and was unable to keep the appointment.                                                                  On admission she was tachycardic up to 147 and was found to be in atrial flutter, HTN up to 142/86, RR 19 and highest temperature of 98.5.  She was found to have a TSH of less than 0.01, T4 45.5, T3 651.  She was started on propanolol 3 times daily, methimazole 20 mg 4 times daily, and has been being diuresed with IV Lasix.   Past Medical History:   Diagnosis Date  . Anemia   . Anxiety   . CAP (community acquired pneumonia) 02/13/2016  . CHF (congestive heart failure) ([facility])   . Chronic bronchitis ([place])   . Diabetes mellitus without complication ([place])   . Dyspnea    with exertion  . Graves disease   . Graves disease   . Headache   . Hypertension   . Myocardial infarction ([facility]) 2013  . Obesity   . Pneumonia "several times"  . Seasonal allergies     Past Surgical History:  Procedure Laterality Date  . CESAREAN SECTION  2008, 2013   x 2  . CESAREAN SECTION N/A 07/21/2016   Procedure: CESAREAN SECTION;  Surgeon: Guss Bunde, MD;  Location: [facility];  Service: Obstetrics;  Laterality: N/A;  . EYE SURGERY Bilateral 2014   "for Graves Disease; eyelid retraction on left; decompression on the right""    Family History  Problem Relation Age of Onset  . Hypertension Mother     Social History:  reports that she has never smoked. She has never used smokeless tobacco. She reports that she does not drink alcohol or use drugs.  Allergies:  Allergies  Allergen Reactions  . Iodine Anaphylaxis, Hives and Swelling    Throat swells  . Shellfish Allergy Anaphylaxis and Hives    Throat swells  . Latex Rash  . Tape Rash    NO PAPER TAPE!!!    Review of systems:  Review of Systems  Constitutional: Positive for diaphoresis, fever, malaise/fatigue and weight loss (30 lbs over 2.5 months). Negative for chills.  HENT: Negative.   Eyes: Positive for blurred vision and photophobia.  Respiratory: Positive for shortness of breath. Negative for cough and sputum production.   Cardiovascular: Positive for palpitations. Negative for chest pain.  Gastrointestinal: Positive for diarrhea, nausea and vomiting. Negative for abdominal pain.  Genitourinary: Negative for dysuria, frequency and urgency.  Skin: Negative for itching and rash.  Neurological: Positive for tremors and headaches.  Endo/Heme/Allergies: Positive for  environmental allergies. Bruises/bleeds easily.  Psychiatric/Behavioral: The patient is nervous/anxious.      Medications: I have reviewed the patient's current medications.  Physical Exam:  Blood pressure 116/64, pulse (!) 118, temperature 99.1 F (37.3 C), temperature source Oral, resp. rate (!) 40, height '5\' 6"'$  (1.676 m), weight 107.8 kg, last menstrual period 09/06/2018, SpO2 98 %.   Physical Exam  Constitutional: She is oriented to person, place, and time and well-developed, well-nourished, and in no distress.  HENT:  Head: Normocephalic and atraumatic.  Eyes: Pupils are equal, round, and reactive to light. Right eye exhibits no discharge. Left eye exhibits no discharge.  Luedde exophthalmometer showed 25 mm of left and right eye, right eye lid lag  Neck: Normal range of motion. No JVD present. No tracheal deviation present. Thyromegaly present.  Symmetrically diffusely enlarged thyroid, mobile, nontender , with thyroid bruit Cardiovascular:  Tachycardic, regular rhythm, no murmurs rubs or gallops  Pulmonary/Chest: Effort normal and breath sounds normal. No respiratory distress. She has no wheezes.  Abdominal: Soft. She exhibits no distension. There is no tenderness.  Musculoskeletal: Normal range of motion. She exhibits no edema.  Lymphadenopathy:    She has no cervical adenopathy.  Neurological: She is alert and oriented to person, place, and time.  2+ reflexes symmetric  Skin: Skin is warm. No rash noted. She is diaphoretic. No erythema.  Psychiatric: Mood and affect normal.    Results for orders placed or performed during the hospital encounter of 09/25/18 (from the past 48 hour(s))  MRSA PCR Screening     Status: None   Collection Time: 09/25/18 12:06 PM  Result Value Ref Range   MRSA by PCR NEGATIVE NEGATIVE    Comment:        The GeneXpert MRSA Assay (FDA approved for NASAL specimens only), is one component of a comprehensive MRSA colonization surveillance  program. It is not intended to diagnose MRSA infection nor to guide or monitor treatment for MRSA infections. Performed at [facility], [place] [address], [place], [place] [address]   HIV antibody (Routine Testing)     Status: None   Collection Time: 09/25/18 12:32 PM  Result Value Ref Range   HIV Screen 4th Generation wRfx Non Reactive Non Reactive    Comment: (NOTE) Performed At: [facility] [place], [place] [id number] Rush Farmer MD OI:[id number]   Brain natriuretic peptide     Status: None   Collection Time: 09/25/18 12:32 PM  Result Value Ref Range   B Natriuretic Peptide 69.1 0.0 - 100.0 pg/mL    Comment: Performed at [facility], [place],  [place] [address]  Comprehensive metabolic panel     Status: Abnormal   Collection Time: 09/25/18 12:32 PM  Result Value Ref Range   Sodium 136 135 - 145 mmol/L   Potassium 3.3 (L) 3.5 - 5.1 mmol/L   Chloride 103 98 - 111 mmol/L   CO2 25 22 - 32 mmol/L   Glucose, Bld 123 (H) 70 - 99 mg/dL   BUN 8 6 - 20 mg/dL   Creatinine, Ser 0.38 (L) 0.44 - 1.00 mg/dL   Calcium 9.2 8.9 - 10.3 mg/dL   Total Protein 6.8 6.5 - 8.1 g/dL   Albumin 3.0 (L) 3.5 - 5.0 g/dL   AST 23 15 - 41 U/L   ALT 18 0 - 44 U/L   Alkaline Phosphatase 112 38 - 126 U/L   Total Bilirubin 0.7 0.3 - 1.2 mg/dL   GFR calc non Af Amer >60 >60 mL/min   GFR calc Af Amer >60 >60 mL/min    Comment: (NOTE) The eGFR has been calculated using the CKD EPI equation. This calculation has not been validated in all clinical situations. eGFR's persistently <60 mL/min signify possible Chronic Kidney Disease.    Anion gap 8 5 - 15    Comment: Performed at [facility] [address], [place], [place] [address]  CBC WITH DIFFERENTIAL     Status: Abnormal   Collection Time: 09/25/18 12:32 PM  Result Value Ref Range   WBC 6.7 4.0 - 10.5 K/uL   RBC 5.20 (H) 3.87 - 5.11 MIL/uL   Hemoglobin 11.3 (L) 12.0 - 15.0 g/dL   HCT 37.2  36.0 - 46.0 %   MCV 71.5 (L) 78.0 - 100.0 fL   MCH 21.7 (L) 26.0 - 34.0 pg   MCHC 30.4 30.0 - 36.0 g/dL   RDW 14.8 11.5 - 15.5 %   Platelets 247 150 - 400 K/uL   Neutrophils Relative % 60 %   Neutro Abs 4.0 1.7 - 7.7 K/uL   Lymphocytes Relative 30 %   Lymphs Abs 2.0 0.7 - 4.0 K/uL   Monocytes Relative 9 %   Monocytes Absolute 0.6 0.1 - 1.0 K/uL   Eosinophils Relative 1 %   Eosinophils Absolute 0.1 0.0 - 0.7 K/uL   Basophils Relative 0 %   Basophils Absolute 0.0 0.0 - 0.1 K/uL   Immature Granulocytes 0 %   Abs Immature Granulocytes 0.0 0.0 - 0.1 K/uL    Comment: Performed at [facility], [place] [address], [place], [place] [address]  TSH     Status: Abnormal   Collection Time: 09/25/18 12:32 PM  Result Value Ref Range   TSH <0.010 (L) 0.350 - 4.500 uIU/mL    Comment: Performed by a 3rd Generation assay with a functional sensitivity of <=0.01 uIU/mL. Performed at [facility], [place] [address], [place], [place] [address]   T4, free     Status: Abnormal   Collection Time: 09/25/18 12:32 PM  Result Value Ref Range   Free T4 >5.50 (H) 0.82 - 1.77 ng/dL    Comment: (NOTE) Biotin ingestion may interfere with free T4 tests. If the results are inconsistent with the TSH level, previous test results, or the clinical presentation, then consider biotin interference. If needed, order repeat testing after stopping biotin. Performed at [facility], [place] [address] [place], [place] [address]   T3     Status: Abnormal   Collection Time: 09/25/18 12:32 PM  Result Value Ref Range  T3, Total >651 (H) 71 - 180 ng/dL    Comment: (NOTE) Performed At: [facility] [place], [place] [id number] Rush Farmer MD EY:[id number]   Hemoglobin A1c     Status: None   Collection Time: 09/25/18 12:32 PM  Result Value Ref Range   Hgb A1c MFr Bld 5.5 4.8 - 5.6 %    Comment: (NOTE) Pre diabetes:          5.7%-6.4% Diabetes:              >6.4% Glycemic control for    <7.0% adults with diabetes    Mean Plasma Glucose 111.15 mg/dL    Comment: Performed at [facility], [place] [address] [place], [place] [address]  Troponin I     Status: None   Collection Time: 09/25/18 12:32 PM  Result Value Ref Range   Troponin I <0.03 <0.03 ng/mL    Comment: Performed at [facility] [address], [place], [place] [address]  Urinalysis, Routine w reflex microscopic     Status: Abnormal   Collection Time: 09/25/18  8:07 PM  Result Value Ref Range   Color, Urine YELLOW YELLOW   APPearance CLEAR CLEAR   Specific Gravity, Urine 1.006 1.005 - 1.030   pH 6.0 5.0 - 8.0   Glucose, UA NEGATIVE NEGATIVE mg/dL   Hgb urine dipstick NEGATIVE NEGATIVE   Bilirubin Urine NEGATIVE NEGATIVE   Ketones, ur NEGATIVE NEGATIVE mg/dL   Protein, ur NEGATIVE NEGATIVE mg/dL   Nitrite NEGATIVE NEGATIVE   Leukocytes, UA SMALL (A) NEGATIVE   RBC / HPF 0-5 0 - 5 RBC/hpf   WBC, UA 0-5 0 - 5 WBC/hpf   Bacteria, UA RARE (A) NONE SEEN   Squamous Epithelial / LPF 0-5 0 - 5   Mucus PRESENT     Comment: Performed at [facility] [address], [place], [place] [address]  CBC     Status: Abnormal   Collection Time: 09/26/18  7:08 AM  Result Value Ref Range   WBC 6.2 4.0 - 10.5 K/uL   RBC 5.24 (H) 3.87 - 5.11 MIL/uL   Hemoglobin 11.3 (L) 12.0 - 15.0 g/dL   HCT 37.9 36.0 - 46.0 %   MCV 72.3 (L) 80.0 - 100.0 fL   MCH 21.6 (L) 26.0 - 34.0 pg   MCHC 29.8 (L) 30.0 - 36.0 g/dL   RDW 14.6 11.5 - 15.5 %   Platelets 237 150 - 400 K/uL    Comment: Performed at [facility], [place] [address] [place], [place] [address]  Basic metabolic panel     Status: Abnormal   Collection Time: 09/26/18  7:08 AM  Result Value Ref Range   Sodium 136 135 - 145 mmol/L   Potassium 3.9 3.5 - 5.1 mmol/L   Chloride 104 98 - 111 mmol/L   CO2 23 22 - 32 mmol/L   Glucose, Bld 95 70 - 99 mg/dL   BUN 9 6 - 20 mg/dL   Creatinine, Ser 0.37 (L) 0.44 - 1.00 mg/dL   Calcium 9.5 8.9 - 10.3 mg/dL    GFR calc non Af Amer >60 >60 mL/min   GFR calc Af Amer >60 >60 mL/min    Comment: (NOTE) The eGFR has been calculated using the CKD EPI equation. This calculation has not been validated in all clinical situations. eGFR's persistently <60 mL/min signify possible Chronic Kidney Disease.    Anion gap 9 5 - 15  Comment: Performed at [facility], [place] [address] [place], [place] [address]  Iron and TIBC     Status: Abnormal   Collection Time: 09/26/18  7:08 AM  Result Value Ref Range   Iron 78 28 - 170 ug/dL   TIBC 232 (L) 250 - 450 ug/dL   Saturation Ratios 34 (H) 10.4 - 31.8 %   UIBC 154 ug/dL    Comment: Performed at [facility], [place] [address], [place], [place] [address]  Ferritin     Status: Abnormal   Collection Time: 09/26/18  7:08 AM  Result Value Ref Range   Ferritin 418 (H) 11 - 307 ng/mL    Comment: Performed at [facility], [place] [address], [place], [place] [address]    Portable Chest 1 View  Result Date: 09/25/2018 CLINICAL DATA:  Shortness of breath ,hx htn,dm,chf,mi EXAM: PORTABLE CHEST 1 VIEW COMPARISON:  None 03/08/2018 FINDINGS: Heart size is accentuated by the AP portable technique. No focal consolidations or pleural effusions. No pulmonary edema. IMPRESSION: No active disease. Electronically Signed   By: Nolon Nations M.D.   On: 09/25/2018 12:39     Assessment/Plan: 1.  Graves' disease with severe symptomatic hyperthyroidism, thyroid eye disease, thymic hyperplasia, and goiter.  It does not appear that she is in thyroid storm at this time however she has been having a lot of symptoms due to her elevated thyroid hormone.  She is tachycardic, has some mild agitation, nausea, vomiting, and diaphoresis. She has been afebrile since admission, has not had any signs of altered mental status, has not had a tremor, and has normal reflexes.  She also reports that she has been taking her methimazole 20 mg once daily which makes a thyroid storm less likely.  She was found to have a TSH <0.01, >651, and T4 5.50. This is likely just severe symptomatic hyperthyroidism due to medication noncompliance.  It appears that she has been having difficulties with her insurance and paying for medication. she reports that she would be interested in a thyroidectomy when she improved to control of her Graves' disease.  --Recommend increasing methimazole to 20 mg 3 or 4 times daily (she can go to 60 mg once a day when discharged) -Recommend starting cholestyramine 4 g by mouth twice daily -Daily free T4 -Continue beta blocker, adjust as needed for appropriate rate control, we will defer to cardiology and family medicine service -Can consider starting prednisone 10 mg once a day which can decrease conversion of T4 to T3 but only after discussing with cardiology given her history of heart failure -Can consider lithium, plasmapheresis or corticosteroids if necessary -She would likely benefit from a thyroidectomy or radioactive iodine therapy once her symptoms improve  2.  History of non-adherence to medication, patient has been having difficulty obtaining her medications due to insurance issues.   Asencion Noble 09/26/2018, 5:25 PM   Today the patient was seen, examined, and discussed with resident physician, Dr. Lonia Skinner.   Today we reviewed lab reports, CT report (including iodinated contrast on 09/11/2018), and hospital notes.    I discussed the care plan and recommendations in detail with two physicians from the attending service, Drs. Orson Eva and Doristine Mango.     As we discussed with Pernell, radioactive iodine therapy for hyperthyroidism may worsen thyroid eye disease.  Prednisone may protect from that, if she is able to tolerate prednisone.  Ms. Stefanski seems inclined to obtain thyroid surgery.  She understands  that her hyperthyroidism needs to improve before thyroid surgery.    Education provided today, including patient handouts from the  American [employer] Association for the following topics:  Hyperthyroidism, Graves' disease, Radioactive iodine, Thyroid surgery.

## 2018-09-26 NOTE — Progress Notes (Addendum)
Advanced Heart Failure Rounding Note  PCP-Cardiologist: No primary care provider on file.   Subjective:    Admitted in A flutter and hyperthyroidism. Started on methimazole and propanol. Diuresed with IV lasix.   TSH <0.010 T3 >651 T4>5.5   Feeling a little better. Wants to know when she can go home.  Back in NSR/sinus tach. Denies CP or SOB.   Objective:   Weight Range: 107.8 kg Body mass index is 38.36 kg/m.   Vital Signs:   Temp:  [97.6 F (36.4 C)-99.4 F (37.4 C)] 98.7 F (37.1 C) (10/08 0738) Pulse Rate:  [87-145] 114 (10/08 0733) Resp:  [19-38] 26 (10/08 0738) BP: (94-134)/(55-92) 120/78 (10/08 0738) SpO2:  [90 %-99 %] 99 % (10/08 0738) Weight:  [107.8 kg-109.1 kg] 107.8 kg (10/08 0452) Last BM Date: 09/24/18  Weight change: Filed Weights   09/25/18 1126 09/26/18 0452  Weight: 109.1 kg 107.8 kg    Intake/Output:   Intake/Output Summary (Last 24 hours) at 09/26/2018 0808 Last data filed at 09/25/2018 2200 Gross per 24 hour  Intake 720 ml  Output 900 ml  Net -180 ml      Physical Exam    General:   Lying flat in bed No resp difficulty HEENT: exophthalmosis Neck: Supple. JVP 6-7. Carotids 2+ bilat; no bruits. No lymphadenopathy. + thyromegaly Cor: PMI nondisplaced. Tach/irregular rate & rhythm. No rubs, gallops or murmurs. Lungs: Clear Abdomen: Obese Soft, nontender, nondistended. No hepatosplenomegaly. No bruits or masses. Good bowel sounds. Extremities: No cyanosis, clubbing, rash, edema Neuro: Alert & orientedx3, cranial nerves grossly intact. moves all 4 extremities w/o difficulty. Affect pleasant   Telemetry   Sinus rhythm and sinus tach with PAC/PVCs 80-120s  Personally reviewed   EKG     n/a   Labs    CBC Recent Labs    09/25/18 1232 09/26/18 0708  WBC 6.7 6.2  NEUTROABS 4.0  --   HGB 11.3* 11.3*  HCT 37.2 37.9  MCV 71.5* 72.3*  PLT 247 237   Basic Metabolic Panel Recent Labs    15/72/62 0851 09/25/18 1232  NA 135  136  K 3.5 3.3*  CL 101 103  CO2 26 25  GLUCOSE 113* 123*  BUN 7 8  CREATININE 0.41* 0.38*  CALCIUM 9.5 9.2   Liver Function Tests Recent Labs    09/25/18 1232  AST 23  ALT 18  ALKPHOS 112  BILITOT 0.7  PROT 6.8  ALBUMIN 3.0*   No results for input(s): LIPASE, AMYLASE in the last 72 hours. Cardiac Enzymes Recent Labs    09/25/18 1232  TROPONINI <0.03    BNP: BNP (last 3 results) Recent Labs    07/18/18 0222 09/11/18 1918 09/25/18 1232  BNP 164.9* 142.6* 69.1    ProBNP (last 3 results) No results for input(s): PROBNP in the last 8760 hours.   D-Dimer No results for input(s): DDIMER in the last 72 hours. Hemoglobin A1C Recent Labs    09/25/18 1232  HGBA1C 5.5   Fasting Lipid Panel No results for input(s): CHOL, HDL, LDLCALC, TRIG, CHOLHDL, LDLDIRECT in the last 72 hours. Thyroid Function Tests Recent Labs    09/25/18 1232  TSH <0.010*    Other results:   Imaging    Portable Chest 1 View  Result Date: 09/25/2018 CLINICAL DATA:  Shortness of breath ,hx htn,dm,chf,mi EXAM: PORTABLE CHEST 1 VIEW COMPARISON:  None 03/08/2018 FINDINGS: Heart size is accentuated by the AP portable technique. No focal consolidations or pleural effusions. No  pulmonary edema. IMPRESSION: No active disease. Electronically Signed   By: Norva Pavlov M.D.   On: 09/25/2018 12:39      Medications:     Scheduled Medications: . apixaban  5 mg Oral BID  . furosemide  40 mg Intravenous BID  . methimazole  20 mg Oral QID  . potassium chloride  40 mEq Oral BID  . propranolol  60 mg Oral TID     Infusions:   PRN Medications:  acetaminophen **OR** acetaminophen, benzocaine, ondansetron **OR** ondansetron (ZOFRAN) IV    Patient Profile   Tara [name] is a 27 year old with a history of untreated graves disease, diastolic heart failure, HTN, pregnancy complicated by pre-eclampsia, and SVT.   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    Assessment/Plan   1. 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 She has untreated Graves disease.  -Was on methimazole but only taking half the recommended dose.  She has not had endocrinology follow up in the community.  - Started on methimazole 20 mg twice a day - Endocrinology consulted per primary team.   2. New Onset A flutter RVR Will need cardioversion once thyroid better controlled unless she decompensates.  -Looks like she is going in and out. -Started on propanalol  -Continue  eliquis 5 mg twice a day.  -Would not use amiodarone due to hyperthyroidism. I   3. A/C Diastolic Heart Failure  ECHO 06/2018 EF ~60%.  -Give one more dose of IV lasix.  -Renal function stable.   4. HTN  Resolved on propanalol.   5. Weight loss In the setting of hyperthyroidism   6. Social  Noncompliance due to poor socio-economics. She has intermittent insurance. When she works she loses Medicaid. Has transportation and food issues.   Medication concerns reviewed with patient and pharmacy team. Barriers identified:n/a   Length of Stay: 1  Amy Clegg, NP  09/26/2018, 8:08 AM  Advanced Heart Failure Team Pager [phone] (M-F; 7a - 4p)  Please contact [facility] for night-coverage after hours (4p -7a ) and weekends on [email or web address]  Patient seen and examined with Tonye Becket, NP. We discussed all aspects of the encounter. I agree with the assessment and plan as stated above.   She has thyroid storm. Admitted with atrial flutter. Being treated with methimazole and propanolol. She is now back in SR and S tach with rates 80-120s. Would continue propanolol at 60 tid and methimazole. Await endocrine input for definitive treatment of thyroid storm. Not candidate for amiodarone due to potential worsening of storm. Can increase propanolol to 80 tid as needed.   Arvilla Meres, MD  11:52 AM

## 2018-09-26 NOTE — Progress Notes (Signed)
Patient refusing lab draws this morning.

## 2018-09-26 NOTE — Progress Notes (Signed)
Nutrition Brief Note  Patient identified on the Malnutrition Screening Tool (MST) Report  Wt Readings from Last 15 Encounters:  09/26/18 107.8 kg  09/25/18 109 kg  09/11/18 123.8 kg  07/18/18 120.2 kg  05/08/18 125.6 kg  04/26/18 126.9 kg  09/27/17 124.4 kg  08/10/17 117.5 kg  08/02/17 113.4 kg  07/22/17 110.2 kg  07/07/17 112.9 kg  06/29/17 109.8 kg  06/23/17 109.6 kg  06/23/17 109.3 kg  06/09/17 111 kg   DALEENA CAMPUZANO is a 27 y.o. female with a past medical history significant for hypertension, Graves' disease, diastolic heart failure who presents today with increasing shortness of breath, palpitations found to be in atrial flutter in the setting of thyroid crisis.  Pt admitted with chronic worsening palpitations and uncontrolled Graves' disease.   Spoke with pt at bedside, who was overall disinterested in speaking with this RD. She made minimal eye contact and continued to look at her phone throughout most of the interview. She reports she does not like being on a diet restriction. She shares poor po intake for about 3 days PTA due to nausea and vomiting. She typically consumes 2 meals per day, which consist of chicken, green beans, and a starch or other vegetable. She denies any changes with the foods chosen or amount that she eats.   Pt endorses a 30# wt loss over the past 3 months, which is not consistent with wt hx. Noted pt has experienced a 13.3% wt loss over the past year, which is not significant for time frame. Per MD notes, pt with issues with food insecurity and intermittent insurance coverage and is often not able to afford medications; MD suspects weight loss related to hyperthyroidism.   Offered supplements and snacks with meals, however, pt declined. She reports "I thought it was ok to lose weight". Discussed with pt that slow weight loss with lifestyle modifications can be beneficial, but large amount of weight loss over a short period of time can be concerning. Pt  does not seem bothered by weight loss.   Nutrition-Focused physical exam completed. Findings are no fat depletion, no muscle depletion, and mild edema.   Labs reviewed  Body mass index is 38.36 kg/m. Patient meets criteria for obesity, class II based on current BMI.   Current diet order is Heart Healthy with 1.8 L fluid restriction, patient is consuming approximately n/a% of meals at this time. Labs and medications reviewed.   No nutrition interventions warranted at this time. If nutrition issues arise, please consult RD.   Argie Lober A. Mayford Knife, RD, LDN, CDE Pager: [phone] After hours Pager: [phone]

## 2018-09-26 NOTE — Progress Notes (Signed)
After discussion with endocrinology increased propranolol to 80 mg 3 times a day.  Appreciated conversation with Dr. [name] regarding that although propranolol does have a theoretical benefit of inhibiting T4 to T3, that other beta-blockers would be reasonable as well because the main benefit would be rate control and beta-blockade.  Leland Her, DO PGY-3, [facility] 09/26/2018 6:49 PM

## 2018-09-26 NOTE — Addendum Note (Signed)
Encounter addended by: Marcy Siren, LCSW on: 09/26/2018 8:50 AM  Actions taken: Sign clinical note

## 2018-09-26 NOTE — Discharge Summary (Addendum)
Family Medicine Teaching [facility] Discharge Summary  Patient name: Tara [name] Medical record number: [id number] Date of birth: 1991-06-26 Age: 27 y.o. 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  Cardiovascular: Normal rate and rhythm, no murmurs, rubs or gallops. Normal S1/S2. Respiratory: CTAB, able to speak in full sentences. No respiratory distress. Abdomen: Soft, nontender, nondistended Extremities: Able to move all extremities w/o difficulty. Neuro: No gross neurological deficits, able to follow commands appropriately.  Psych: Appropriate affect  Brief Hospital Course:  [name] is a 27 y.o. female for past medical history significant for hypertension, Graves' disease, diastolic heart failure who presented on 10/7 to heart failure clinic where she was found to be in atrial flutter with a heart rate in the 150s and symptoms concerning for thyrotoxicosis. She was sent to the ED for further management.   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 Per reports patient was taking half of her prescribed dose due to financial issues. The patient was placed on 40mg  IV lasix daily with appropriate urinary output per cards recommendations. Endocrinology was consulted for her thyrotoxicosis. Her methimazole was increased to to 20mg  QID and she was discharged home on 60mg  daily. She was also started on cholestyramine  4 g by mouth twice daily. She was placed on prednisone per cardiology recs to slow conversion of T4 to T3. Her HR was controlled with propranolol 80 mg TID and was then switched to atenolol 50mg  daily to help with compliance due to once daily dosing. T4 had dropped to 4.33 on day of discharge.  She was evaluated by ENT for possible thyroidectomy. She is to follow up outpt for further eval.  Atrial fibrillation/atrial fibrillation No further episodes outside of clinic although she did go in an out of atrial fibrillation. She was started on the propraolol and subsequently switched to atenolol as above. She was also placed on eliquis 5mg  bid per cardiology recs. She underwent an echo prior to discharge which showed mitral valve prolapse, a normal EF, and severely elevated pulmonary artery pressures. She has follow up scheduled with cardiology prior to discharge.  Medication non-compliance secondary to cost Given her ongoing social concerns, social work was able to evaluate the patient and provide recommendations. They identified social barriers and agreed to further see her as an outpatient. She currently has Medicaid and was discharged home on a 30 day supply of her Eliquis, atenolol. She is to take prednisone until follow up with cardiology.  Issues for Follow Up:  1. Patient will need outpatient follow up with cardiology regarding echo results 2. Patient will need to follow up with ENT regarding thyroidectomy 3. Patient will need follow up with social work  Significant Procedures: Transthoracic echocardiogram  Significant Labs and Imaging:  Recent Labs  Lab 09/25/18 1232 09/26/18 0708  WBC 6.7 6.2  HGB 11.3* 11.3*  HCT 37.2 37.9  PLT 247 237   Recent Labs  Lab 09/25/18 0851 09/25/18 1232 09/26/18 0708 09/27/18 0302 09/28/18 0322  NA 135 136 136 136 136  K 3.5 3.3* 3.9 4.0 4.0  CL 101 103 104 105 106  CO2 26 25 23 26 22   GLUCOSE 113* 123* 95 120* 108*  BUN 7 8 9 8 9   CREATININE  0.41* 0.38* 0.37* 0.41* 0.40*  CALCIUM 9.5 9.2 9.5 9.1 9.2  MG  --   --   --  1.8  --   ALKPHOS  --  112  --   --   --   AST  --  23  --   --   --   ALT  --  18  --   --   --   ALBUMIN  --  3.0*  --   --   --       Results/Tests Pending at Time of Discharge:  TTE: Pending  Discharge Medications:  Allergies as of 09/28/2018      Reactions   Iodine Anaphylaxis, Hives, Swelling   Throat swells   Shellfish Allergy Anaphylaxis, Hives   Throat swells   Latex Rash   Tape Rash   NO PAPER TAPE!!!      Medication List    STOP taking these medications   diphenhydramine-acetaminophen 25-500 MG Tabs tablet Commonly known as:  TYLENOL PM   ibuprofen 800 MG tablet Commonly known as:  ADVIL,MOTRIN   labetalol 300 MG tablet Commonly known as:  NORMODYNE   metolazone 2.5 MG tablet Commonly known as:  ZAROXOLYN   Potassium Chloride ER 20 MEQ Tbcr     TAKE these medications   acetaminophen 500 MG tablet Commonly known as:  TYLENOL Take 500-1,000 mg by mouth every 6 (six) hours as needed (for pain or headaches).   apixaban 5 MG Tabs tablet Commonly known as:  ELIQUIS Take 1 tablet (5 mg total) by mouth 2 (two) times daily.   atenolol 50 MG tablet Commonly known as:  TENORMIN Take 1 tablet (50 mg total) by mouth daily. Start taking on:  09/29/2018   cholestyramine 4 g packet Commonly known as:  QUESTRAN Take 1 packet (4 g total) by mouth 2 (two) times daily.   furosemide 40 MG tablet Commonly known as:  LASIX TAKE 1 TABLET BY MOUTH DAILY   methimazole 10 MG tablet Commonly known as:  TAPAZOLE Take 6 tablets (60 mg total) by mouth daily. What changed:    how much to take  when to take this   predniSONE 10 MG tablet Commonly known as:  DELTASONE Take 1 tablet (10 mg total) by mouth daily with breakfast. Start taking on:  09/29/2018       Discharge Instructions: Please refer to Patient Instructions section of EMR for full details.  Patient was counseled  important signs and symptoms that should prompt return to medical care, changes in medications, dietary instructions, activity restrictions, and follow up appointments.   Follow-Up Appointments: Follow-up Information    Newman Pies, MD. Call.   Specialty:  Otolaryngology Why:  Please call and make an hospital follow up appointment Contact information: [address] [place] [address] [phone]        [facility]. Go on 10/11/2018.   Specialty:  Cardiology Why:  in the Advanced Heart Failure Clinic at 11:00AM--Please bring all medications to appt.  Gate Code 1700 for October. Contact information: [address] [phone] mc [place] [address] [phone]       [facility]. Go on 10/04/2018.   Why:  Please go to your hospital follow up appointment at 9:10am,  arrive early for check in. Contact information: [address] [place] [address] [phone]       Talmage Coin, MD. Call.   Specialty:  Endocrinology Why:  Please call and make a hospital follow up appointment Contact information: [address] [place] [address] [phone]           Myrene Buddy, MD 09/28/2018, 1:14 PM

## 2018-09-26 NOTE — Care Management (Signed)
CM contacted attending services and informed that Eliquis on the medicaid formulary - copay assistance not available. Methimazole is not on the list.

## 2018-09-26 NOTE — Progress Notes (Signed)
Family Medicine Teaching Service Daily Progress Note Intern Pager: [phone]  Patient name: Tara [name] Medical record number: [id number] Date of birth: 1991-02-19 Age: 27 y.o. Gender: female  Primary Care Provider: Sela Hilding, MD Consultants: cardiology, endocrinology Code Status: Full  Pt Overview and Major Events to Date:   No acute events overnight, pt was having oral pain from wisdom teeth and was provided Orajel at bedside which improved her symptoms.   Assessment and Plan: Tara [name] is a 27 y.o. female with a past medical history significant for hypertension, Graves' disease, diastolic heart failure who admitted for atrial flutter in the setting of thyroid crisis.  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 Initially was concerning for thyroid storm given tachycardia but patient did not have hyperpyrexia or altered mentation.  TSH <0.010, T3 >651,T4>5.5. Repeat EKG with sinus tach (123).  -Continue propranolol 76m TID, given ongoing tachycardia, will consider increasing to 858mTID.  -Continue methimazole 20 mg daily while IP, not on medicaid list as OP; will need to find another OP regimen question if patient may benefit from thyroidectomy as synthroid is much more affordable for patient than methimazole. -Consult Endocrinology (Dr. KeBuddy Duty awaiting recs   Hypertension, stable: BP normotensive with max 134/78 -Will CTM closely -Continue propranolol 60107mID   New Onset Atrial Flutter with RVR: EKG with evidence of atrial flutter and patient symptomatic on admission with palpitations. Likely secondary to uncontrolled graves vs thyroid crisis. HR now 87-118 -Per cards, consider cardioversion once thyroid crisis is better controlled unless clinical picture worsens; though with intermittent a. Flutter less likely. Doubt need for DCCV but appreciate recommendations -Continue Eliquis 5mg17mID -Avoid amiodarone due to hyperthyroidism  Diastolic Heart Failure, chronic: Patient euvolemic on exam without bibasilar crackles (improved from admission), no LE edema. Net negative 180cc since admission.Wt 109.1 kg on admission 107.8 kg 10/8.  -Per cards, will continue d/c IV lasix, appreciate recs -Strict I/Os and daily weights -consider repeat echo if clinical picture worsens  Social Concerns: Social work consulted who is familiar with patient. They met Tara [name] in clinic and identified multiple social determinants impacting her health.  -CSW will follow w patient in the outpatient setting -JackAlexis FrockSW-MCS [phone], Resolved: Potassium on admission found to be 3.3, repleted with 40 mEqx2 PO. Repeat K on 10/8 found to be appropriate at 3.9. EKG without U waves.  -Given ongoing diuresis will continue to monitor  FEN/GI: Heart Healthy diet, Zofran PRN PPx: Eliquis  Disposition: pending clinical improvement, likely home.   Subjective:  Patient reports she feels okay and wishes to go home. Say her shortness of breath is improved, chest pain has resolved. Reports she is going to the bathroom regularly. Denies feeling anxious but does report heart palpitations which she says is normal for her. Overall says she is back at her baseline.   Objective: Temp:  [97.6 F (36.4 C)-99.4 F (37.4 C)] 98.7 F (37.1 C) (10/08 0738) Pulse Rate:  [87-147] 114 (10/08 0733) Resp:  [19-38] 26 (10/08 0738) BP: (94-142)/(55-92) 120/78 (10/08 0738) SpO2:  [90 %-99 %] 99 % (10/08 0738) Weight:  [107.8 kg-109.1 kg] 107.8 kg (10/08 0452) Physical Exam: General: Anxious appearing, fidgety. In hospital bed. Cardiovascular: Tachycardic, normal S1/S2, No rubs gallops or murmurs. Well perfused extremities Respiratory: CTAB, able to speak in full sentences. Abdomen: Soft, nontender, nondistended. Extremities: No edema or rashes, able to move all without difficulty. Neuro:  Alert and oriented, cranial nerves grossly intact.  Psych: Distant affect  Laboratory: Recent Labs  Lab 09/25/18 1232 09/26/18 0708  WBC 6.7 6.2  HGB 11.3* 11.3*  HCT 37.2 37.9  PLT 247 237   Recent Labs  Lab 09/25/18 0851 09/25/18 1232  NA 135 136  K 3.5 3.3*  CL 101 103  CO2 26 25  BUN 7 8  CREATININE 0.41* 0.38*  CALCIUM 9.5 9.2  PROT  --  6.8  BILITOT  --  0.7  ALKPHOS  --  112  ALT  --  18  AST  --  23  GLUCOSE 113* 123*   Ferritin: 418 (H) Iron: 78 (N) TIBC: 232 (L) Saturation rate: 34 (N) A1C: 5.5  UA: Positive leuks and bacteria  Imaging/Diagnostic Tests:  Chest Xray 10/7 FINDINGS: Heart size is accentuated by the AP portable technique. No focal consolidations or pleural effusions. No pulmonary edema.  IMPRESSION: No active disease.  EKG 10/8: Sinus tach, possible LAE  Terance Hart, April A, Medical Student 09/26/2018, 7:43 AM FPTS Intern pager: [phone], text pages welcome  FPTS Upper-Level Resident Addendum  I have independently interviewed and examined the patient. I have discussed the above with the original author and agree with their documentation. My edits for correction/addition/clarification are in blue. Please see also any attending notes.   Bufford Lope, DO PGY-3, [facility] 09/26/2018 1:40 PM  FPTS Service pager: [phone] (text pages welcome through [facility])

## 2018-09-27 DIAGNOSIS — E0500 Thyrotoxicosis with diffuse goiter without thyrotoxic crisis or storm: Secondary | ICD-10-CM

## 2018-09-27 DIAGNOSIS — I4892 Unspecified atrial flutter: Secondary | ICD-10-CM

## 2018-09-27 DIAGNOSIS — E0501 Thyrotoxicosis with diffuse goiter with thyrotoxic crisis or storm: Principal | ICD-10-CM

## 2018-09-27 DIAGNOSIS — R0602 Shortness of breath: Secondary | ICD-10-CM

## 2018-09-27 LAB — BASIC METABOLIC PANEL
ANION GAP: 5 (ref 5–15)
BUN: 8 mg/dL (ref 6–20)
CALCIUM: 9.1 mg/dL (ref 8.9–10.3)
CO2: 26 mmol/L (ref 22–32)
Chloride: 105 mmol/L (ref 98–111)
Creatinine, Ser: 0.41 mg/dL — ABNORMAL LOW (ref 0.44–1.00)
Glucose, Bld: 120 mg/dL — ABNORMAL HIGH (ref 70–99)
Potassium: 4 mmol/L (ref 3.5–5.1)
Sodium: 136 mmol/L (ref 135–145)

## 2018-09-27 LAB — MAGNESIUM: MAGNESIUM: 1.8 mg/dL (ref 1.7–2.4)

## 2018-09-27 LAB — T4, FREE: Free T4: 4.77 ng/dL — ABNORMAL HIGH (ref 0.82–1.77)

## 2018-09-27 MED ORDER — ATENOLOL 50 MG PO TABS
50.0000 mg | ORAL_TABLET | Freq: Every day | ORAL | Status: DC
  Administered 2018-09-27 – 2018-09-28 (×2): 50 mg via ORAL
  Filled 2018-09-27 (×2): qty 1

## 2018-09-27 MED ORDER — FUROSEMIDE 40 MG PO TABS
40.0000 mg | ORAL_TABLET | Freq: Every day | ORAL | Status: DC
  Administered 2018-09-27 – 2018-09-28 (×2): 40 mg via ORAL
  Filled 2018-09-27 (×2): qty 1

## 2018-09-27 MED ORDER — PREDNISONE 10 MG PO TABS
10.0000 mg | ORAL_TABLET | Freq: Every day | ORAL | Status: DC
  Administered 2018-09-27 – 2018-09-28 (×2): 10 mg via ORAL
  Filled 2018-09-27 (×2): qty 1

## 2018-09-27 NOTE — Progress Notes (Addendum)
Advanced Heart Failure Rounding Note  PCP-Cardiologist: No primary care provider on file.   Subjective:    Admitted in A flutter and hyperthyroidism.   Endocrinology consulted 10/8 with recommendations for --> propanolol 80 mg tid, methimazole 20 mg qid, and cholestyramine 4 mg twice a day. Prednisone was also recommended.   Feeling better. Denies SOB. Anxious to go home.     Objective:   Weight Range: 108.3 kg Body mass index is 38.54 kg/m.   Vital Signs:   Temp:  [98 F (36.7 C)-99.1 F (37.3 C)] 98.3 F (36.8 C) (10/09 0803) Pulse Rate:  [80-118] 84 (10/09 0335) Resp:  [33-40] 35 (10/08 2356) BP: (95-132)/(64-83) 109/83 (10/09 0335) SpO2:  [98 %-99 %] 98 % (10/09 0335) Weight:  [108.3 kg] 108.3 kg (10/09 0335) Last BM Date: 09/26/18  Weight change: Filed Weights   09/25/18 1126 09/26/18 0452 09/27/18 0335  Weight: 109.1 kg 107.8 kg 108.3 kg    Intake/Output:   Intake/Output Summary (Last 24 hours) at 09/27/2018 0834 Last data filed at 09/26/2018 2300 Gross per 24 hour  Intake 762 ml  Output 1550 ml  Net -788 ml      Physical Exam    General:   No resp difficulty. Sitting on the side of the bed.   HEENT: exophthalmos.  Neck: supple. JVP 6-7.  Carotids 2+ bilat; no bruits. No lymphadenopathy . + thyromegaly appreciated. Cor: PMI nondisplaced. Tachy rate & rhythm. No rubs, gallops or murmurs. Lungs: clear Abdomen: soft, nontender, nondistended. No hepatosplenomegaly. No bruits or masses. Good bowel sounds. Extremities: no cyanosis, clubbing, rash, edema Neuro: alert & orientedx3, cranial nerves grossly intact. moves all 4 extremities w/o difficulty. Affect pleasant   Telemetry   Sinus Rhythm/Sinus Tach 80-120s personally reviewed.   EKG     n/a   Labs    CBC Recent Labs    09/25/18 1232 09/26/18 0708  WBC 6.7 6.2  NEUTROABS 4.0  --   HGB 11.3* 11.3*  HCT 37.2 37.9  MCV 71.5* 72.3*  PLT 247 237   Basic Metabolic Panel Recent Labs      09/26/18 0708 09/27/18 0302  NA 136 136  K 3.9 4.0  CL 104 105  CO2 23 26  GLUCOSE 95 120*  BUN 9 8  CREATININE 0.37* 0.41*  CALCIUM 9.5 9.1   Liver Function Tests Recent Labs    09/25/18 1232  AST 23  ALT 18  ALKPHOS 112  BILITOT 0.7  PROT 6.8  ALBUMIN 3.0*   No results for input(s): LIPASE, AMYLASE in the last 72 hours. Cardiac Enzymes Recent Labs    09/25/18 1232  TROPONINI <0.03    BNP: BNP (last 3 results) Recent Labs    07/18/18 0222 09/11/18 1918 09/25/18 1232  BNP 164.9* 142.6* 69.1    ProBNP (last 3 results) No results for input(s): PROBNP in the last 8760 hours.   D-Dimer No results for input(s): DDIMER in the last 72 hours. Hemoglobin A1C Recent Labs    09/25/18 1232  HGBA1C 5.5   Fasting Lipid Panel No results for input(s): CHOL, HDL, LDLCALC, TRIG, CHOLHDL, LDLDIRECT in the last 72 hours. Thyroid Function Tests Recent Labs    09/25/18 1232  TSH <0.010*    Other results:   Imaging    No results found.   Medications:     Scheduled Medications: . apixaban  5 mg Oral BID  . cholestyramine  4 g Oral BID  . methimazole  20 mg Oral  QID  . potassium chloride  40 mEq Oral BID  . propranolol  80 mg Oral TID    Infusions:   PRN Medications: acetaminophen **OR** acetaminophen, benzocaine, ondansetron **OR** ondansetron (ZOFRAN) IV    Patient Profile   Tara [name] is a 27 year old with a history of untreated graves disease, diastolic heart failure, HTN, pregnancy complicated by pre-eclampsia, and SVT.   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   Assessment/Plan   1. 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 She has untreated Graves disease.  -Prior to admit she was taking half the recommended dose of methimazole.   She has not had endocrinology follow up in the community.  -Encrinology consulted. --> Continue  methimazole 20 mg qid, propanalol 80 mg tid , and cholestryamin 4 gram twice a  day.  - Start prednisone 10 mg daly. Ok to start today.  - Endocrinology appreciated.   2. New Onset A flutter RVR Will need cardioversion once thyroid better controlled unless she decompensates.  Back in regular rhythm but rate uncontrolled.   -Continue  eliquis 5 mg twice a day.  -Would not use amiodarone due to hyperthyroidism.   3. A/C Diastolic Heart Failure  ECHO 06/2018 EF ~60%.  Off IV lasix and start 40 mg po lasix.  -Renal function stable.   4. HTN  Resolved with medications.   5. Weight loss In the setting of hyperthyroidism   6. Social  Noncompliance due to poor socio-economics. She has intermittent insurance. When she works she loses Medicaid. Has transportation and food issues.  She will need 30 day supple of medications prior to discharge.   Length of Stay: 2  Tonye Becket, NP  09/27/2018, 8:34 AM  Advanced Heart Failure Team Pager [phone] (M-F; 7a - 4p)  Please contact [facility] for night-coverage after hours (4p -7a ) and weekends on [email or web address]  Patient seen and examined with Tonye Becket, NP. We discussed all aspects of the encounter. I agree with the assessment and plan as stated above.   HR control much improved with treatment of thyroid storm. Now primarily in NSR with some periods of AFL or atrial tach. Continue b-blocker, methimazole and Eliquis. Will be able to stop blood thinner once thyroid issues resolved. Ok for prednisone from our standpoint. Will need to watch volume status closely as outpatient. Will repeat echo in am prior to d/c to ensure EF not down with rapid rates.   Tara Meres, MD  4:58 PM

## 2018-09-27 NOTE — Progress Notes (Signed)
Family Medicine Teaching Service Daily Progress Note Intern Pager: [phone]  Patient name: Tara [name] Medical record number: [id number] Date of birth: May 04, 1991 Age: 27 y.o. Gender: female  Primary Care Provider: Garth Bigness, MD Consultants: Cardiology, endocrinology, ENT  Code Status: Full  Pt Overview and Major Events to Date:  No acute events overnight; pt remains stable.   Assessment and Plan: Calirose Mccance Powellis a 27 y.o.femalewith a past medical history significant for hypertension, Graves' disease, diastolic heart failure who was admitted for atrial flutter in the setting of thyrotoxicosis.   Thyrotoxicosis 2/2 to uncontrolled Graves disease: Physical exam unchanged from admission. Pt remains tachycardic ranging from 90 to 123. Tachypnea to 38, but pt denies increased SOB from her baseline. She denies palpitations, SOB, HA, heat intolerance. Endocrine consulted, appreciate recs. Daily T4 4.77 -Increase methimazole to 20mg  QID (will increase to 60mg  daily at discharge) -continue cholestyramine 4 g by mouth twice daily -Switch from propanolol 80 mg TID to atenolol 50mg  daily to help with compliance - Prednisone 10mg  daily started by cardiology -Can consider lithium, plasmapheresis or corticosteroids if necessary per endocrine recs - Discussed case with Dr. Suszanne Conners from ENT for thyroidectomy eval. He has agreed to come see patient, appreciate recs.  New Onset Atrial Flutter with RVR: HR improved from admission but still elevated (90-123). Pt remains asymptomatic.  -Continue Eliquis 5mg  BID -Avoid amiodarone 2/2 to hyperthyroidism -holding on DCCV at current time as is currently in and out of aflutter  Diastolic Heart Failure, Chronic: Patient remains euvolemic on exam. Her O2 sats are WNLs without increasing oxygen requirements; she remains ORA. She is net negative 800cc and her weight is 108.3kg, down from 109.1kg on admission. Per cards, changing lasix from IV to  PO. -Strict I/Os and daily weights -PO 40mg  IV lasix per cards, will d/c on this dose  Social Concerns: Pt has financial stressors which make medications not affordable. She has medicaid but anti-thyroid medications are not listed on the Medicaid formulary. Pharmacy has emailed the [facility] team to gather more information -Pt will be able to utilize the preferred list for anti-thyroid medications   Hypokalemia, Resolved. 3.3 on admission, now 4.0 with repletion -KCl 40 mEq BID -will CTM -daily BMP  FEN/GI: heart healthy diet PPx: Eliquis  Disposition: likely home  Subjective:  Patient reports she's doing well. She denies palpitations, HA, heat intolerance, vision changes, SOB, or chest pain. Reports she feels fine and wishes to go home. Says that she has to leave tomorrow am to watch her son. She is interested in surgical management of her thyroid. Tolerating PO well and ambulating/voiding w/o difficulty.   Objective: Temp:  [98 F (36.7 C)-99.1 F (37.3 C)] 98 F (36.7 C) (10/09 0335) Pulse Rate:  [80-118] 84 (10/09 0335) Resp:  [26-40] 35 (10/08 2356) BP: (95-132)/(64-83) 109/83 (10/09 0335) SpO2:  [98 %-99 %] 98 % (10/09 0335) Weight:  [108.3 kg] (P) 108.3 kg (10/09 0335) Physical Exam: General: Well appearing, texting on phone in hospital bed. Cardiovascular: Normal S1/S2, tachycardic, no murmurs, rubs or gallops.  Respiratory: CTAB, speaking in full sentences, no respiratory distress Abdomen: Soft, nontender, nondistended.  Extremities: Able to move all 4 extremities w/o difficulty.   Laboratory: Recent Labs  Lab 09/25/18 1232 09/26/18 0708  WBC 6.7 6.2  HGB 11.3* 11.3*  HCT 37.2 37.9  PLT 247 237   Recent Labs  Lab 09/25/18 1232 09/26/18 0708 09/27/18 0302  NA 136 136 136  K 3.3* 3.9 4.0  CL 103  104 105  CO2 25 23 26   BUN 8 9 8   CREATININE 0.38* 0.37* 0.41*  CALCIUM 9.2 9.5 9.1  PROT 6.8  --   --   BILITOT 0.7  --   --   ALKPHOS 112  --   --   ALT  18  --   --   AST 23  --   --   GLUCOSE 123* 95 120*   Free T4: 4.77 (previous >5.5)   Imaging/Diagnostic Tests: none   Vonita Moss, April A, Medical Student 09/27/2018, 7:37 AM FPTS Intern pager: [phone], text pages welcome ----------------------------------------------------------------------------------------------- Upper Level Addendum: I have seen and evaluated this patient along with April Peterson MS-3 and reviewed the above note, making necessary revisions in brown.  Myrene Buddy MD PGY-2 Family Medicine Resident

## 2018-09-27 NOTE — Plan of Care (Signed)

## 2018-09-27 NOTE — Clinical Social Work Note (Signed)
CSW acknowledges consult that patient needs assistance with medications. RNCM already following.  CSW signing off. Consult again if any social work needs arise.  Charlynn Court, CSW [phone]

## 2018-09-27 NOTE — Consult Note (Signed)
Reason for Consult: Thyroid goiter, Graves disease Referring Physician: Myrene Buddy, MD  HPI:  Tara [name] is an 27 y.o. female who was admitted yesterday for her thyroid storm. She has a history of diastolic congestive heart failure, Graves' disease, and hypertension.  She was diagnosed with Graves' disease in 2011 when she had a thyroid storm and has had multiple admissions for hyperthyroidism.  She is currently prescribed methimazole 40 mg twice daily however she states she only takes 2 tablets (20 mg) once a day due to not being able to afford her medication.  She reports that she has been feeling very anxious, warm, excessively fatigued, diaphoretic, and having heat intolerance. She has also been having some shortness of breath with minimal exertion. She has had a weight loss of about 30 pounds over the past 2.5 months.  She had previously been on PTU but was switched to methimazole when she became pregnant in 2013.  She reports that she felt that the PTU helped control her symptoms.  She had been seen by Dr. [name] previously when she was hospitalized in 05/2017.  She had severe hyperthyroidism at that time and was treated with methimazole 20 mg 3 times daily, cholestyramine 4 g twice daily, labetalol 200 mg twice daily, and prednisone 10 mg once a day.  She had intended to follow-up on an outpatient basis to discuss management of her Luiz Blare' disease after discharge however had issues with her insurance and was unable to keep the appointment.                                                                  On admission she was tachycardic up to 147 and was found to be in atrial flutter, HTN up to 142/86, RR 19 and highest temperature of 98.5.  She was found to have a TSH of less than 0.01, T4 45.5, T3 651.  She was started on propanolol 3 times daily, methimazole 20 mg 4 times daily, and has been diuresed with IV Lasix.  ENT is consulted for possible surgical options.  Past Medical History:   Diagnosis Date  . Anemia   . Anxiety   . CAP (community acquired pneumonia) 02/13/2016  . CHF (congestive heart failure) (HCC)   . Chronic bronchitis (HCC)   . Diabetes mellitus without complication (HCC)   . Dyspnea    with exertion  . Graves disease   . Graves disease   . Headache   . Hypertension   . Myocardial infarction (HCC) 2013  . Obesity   . Pneumonia "several times"  . Seasonal allergies     Past Surgical History:  Procedure Laterality Date  . CESAREAN SECTION  2008, 2013   x 2  . CESAREAN SECTION N/A 07/21/2016   Procedure: CESAREAN SECTION;  Surgeon: Lesly Dukes, MD;  Location: [facility];  Service: Obstetrics;  Laterality: N/A;  . EYE SURGERY Bilateral 2014   "for Graves Disease; eyelid retraction on left; decompression on the right""    Family History  Problem Relation Age of Onset  . Hypertension Mother     Social History:  reports that she has never smoked. She has never used smokeless tobacco. She reports that she does not drink alcohol or use drugs.  Allergies:  Allergies  Allergen Reactions  . Iodine Anaphylaxis, Hives and Swelling    Throat swells  . Shellfish Allergy Anaphylaxis and Hives    Throat swells  . Latex Rash  . Tape Rash    NO PAPER TAPE!!!    Prior to Admission medications   Medication Sig Start Date End Date Taking? Authorizing Provider  acetaminophen (TYLENOL) 500 MG tablet Take 500-1,000 mg by mouth every 6 (six) hours as needed (for pain or headaches).   Yes [provider]  diphenhydramine-acetaminophen (TYLENOL PM) 25-500 MG TABS tablet Take 2 tablets by mouth at bedtime as needed (pain/sleep).   Yes [provider]  furosemide (LASIX) 40 MG tablet TAKE 1 TABLET BY MOUTH DAILY 08/22/18  Yes Garth Bigness, MD  labetalol (NORMODYNE) 300 MG tablet Take 2 tablets (600 mg total) by mouth 2 (two) times daily. 07/18/18  Yes Meccariello, Solmon Ice, DO  ibuprofen (ADVIL,MOTRIN) 800 MG tablet Take 1  tablet (800 mg total) by mouth every 6 (six) hours as needed. Patient not taking: Reported on 09/25/2018 09/11/18   Charlestine Night, PA-C  methimazole (TAPAZOLE) 10 MG tablet Take 4 tablets (40 mg total) by mouth 2 (two) times daily. Patient not taking: Reported on 09/25/2018 07/18/18   Meccariello, Solmon Ice, DO  metolazone (ZAROXOLYN) 2.5 MG tablet Take 1 tablet (2.5 mg total) by mouth daily as needed (for increased weight or swelling). Patient not taking: Reported on 09/25/2018 07/18/18   Meccariello, Solmon Ice, DO  Potassium Chloride ER 20 MEQ TBCR Take 40 mEq by mouth daily as needed (with every dose of metolazone taken). Patient not taking: Reported on 09/25/2018 07/18/18   Meccariello, Solmon Ice, DO    Medications:  I have reviewed the patient's current medications. Scheduled: . apixaban  5 mg Oral BID  . atenolol  50 mg Oral Daily  . cholestyramine  4 g Oral BID  . furosemide  40 mg Oral Daily  . methimazole  20 mg Oral QID  . potassium chloride  40 mEq Oral BID  . predniSONE  10 mg Oral Q breakfast    Review of Systems  Constitutional: Positive for diaphoresis, fever, malaise/fatigue and weight loss (30 lbs over 2.5 months). Negative for chills.  HENT: Negative.   Eyes: Positive for blurred vision and photophobia.  Respiratory: Positive for shortness of breath. Negative for cough and sputum production.   Cardiovascular: Positive for palpitations. Negative for chest pain.  Gastrointestinal: Positive for diarrhea, nausea and vomiting. Negative for abdominal pain.  Genitourinary: Negative for dysuria, frequency and urgency.  Skin: Negative for itching and rash.  Neurological: Positive for tremors and headaches.  Endo/Heme/Allergies: Positive for environmental allergies. Bruises/bleeds easily.  Psychiatric/Behavioral: The patient is nervous/anxious.    Blood pressure 125/68, pulse 82, temperature 98.5 F (36.9 C), temperature source Oral, resp. rate (!) 27, height 5\' 6"  (1.676 m),  weight 108.3 kg, last menstrual period 09/06/2018, SpO2 98 %. Physical Exam  Constitutional: She is oriented to person, place, and time and well-developed, well-nourished, and in no distress.  Head: Normocephalic and atraumatic.  Eyes: Pupils are equal, round, and reactive to light. Extraocular motion is intact.  Ears: Examination of the ears shows normal auricles and external auditory canals bilaterally.  Nose: Nasal examination shows normal mucosa, septum, turbinates.  Face: Facial examination shows no asymmetry. Palpation of the face elicit no significant tenderness.  Mouth: Oral cavity examination shows no mucosal lacerations. No significant trismus is noted.  Neck Palpation of the neck reveals no lymphadenopathy or  mass. The trachea is midline. The thyroid is enlarged.  Neuro: Cranial nerves 2-12 are all grossly in tact. Cardiovascular: Tachycardic, regular rhythm, no murmurs rubs or gallops  Pulmonary/Chest: Effort normal and breath sounds normal. No respiratory distress. She has no wheezes.  Abdominal: Soft. She exhibits no distension.Neurological: She is alert and oriented to person, place, and time.  2+ reflexes symmetric  Skin: Skin is warm. No rash noted. She is diaphoretic. No erythema.  Psychiatric: Mood and affect normal.   Assessment/Plan: Thyroid goiter and acute thyroid storm. Once the patient's symptoms are under control, may consider total thyroidectomy as a possible surgical treatment option. Patient may follow up with me as an outpatient.  Coy Vandoren W Janellie Tennison 09/27/2018, 7:59 PM

## 2018-09-28 ENCOUNTER — Inpatient Hospital Stay (HOSPITAL_COMMUNITY): Payer: Medicaid Other

## 2018-09-28 ENCOUNTER — Encounter (HOSPITAL_COMMUNITY): Payer: Self-pay | Admitting: *Deleted

## 2018-09-28 DIAGNOSIS — E0581 Other thyrotoxicosis with thyrotoxic crisis or storm: Secondary | ICD-10-CM

## 2018-09-28 DIAGNOSIS — I351 Nonrheumatic aortic (valve) insufficiency: Secondary | ICD-10-CM

## 2018-09-28 DIAGNOSIS — E0500 Thyrotoxicosis with diffuse goiter without thyrotoxic crisis or storm: Secondary | ICD-10-CM

## 2018-09-28 DIAGNOSIS — I340 Nonrheumatic mitral (valve) insufficiency: Secondary | ICD-10-CM

## 2018-09-28 LAB — BASIC METABOLIC PANEL
Anion gap: 8 (ref 5–15)
BUN: 9 mg/dL (ref 6–20)
CHLORIDE: 106 mmol/L (ref 98–111)
CO2: 22 mmol/L (ref 22–32)
Calcium: 9.2 mg/dL (ref 8.9–10.3)
Creatinine, Ser: 0.4 mg/dL — ABNORMAL LOW (ref 0.44–1.00)
GFR calc Af Amer: >60 mL/min (ref >60)
GFR calc non Af Amer: >60 mL/min (ref >60)
Glucose, Bld: 108 mg/dL — ABNORMAL HIGH (ref 70–99)
POTASSIUM: 4 mmol/L (ref 3.5–5.1)
Sodium: 136 mmol/L (ref 135–145)

## 2018-09-28 LAB — T4, FREE: Free T4: 4.33 ng/dL — ABNORMAL HIGH (ref 0.82–1.77)

## 2018-09-28 LAB — ECHOCARDIOGRAM COMPLETE
Height: 66 in
Weight: 3820.13 oz

## 2018-09-28 MED ORDER — CHOLESTYRAMINE 4 G PO PACK: 4.0000 g | PACK | Freq: Two times a day (BID) | ORAL | 12 refills | Status: AC

## 2018-09-28 MED ORDER — PREDNISONE 10 MG PO TABS: 10.0000 mg | ORAL_TABLET | Freq: Every day | ORAL | 0 refills | Status: DC

## 2018-09-28 MED ORDER — METHIMAZOLE 10 MG PO TABS: 60.0000 mg | ORAL_TABLET | Freq: Every day | ORAL | 0 refills | Status: AC

## 2018-09-28 MED ORDER — ATENOLOL 50 MG PO TABS: 50.0000 mg | ORAL_TABLET | Freq: Every day | ORAL | 0 refills | Status: DC

## 2018-09-28 MED ORDER — APIXABAN 5 MG PO TABS: 5.0000 mg | ORAL_TABLET | Freq: Two times a day (BID) | ORAL | 0 refills | Status: DC

## 2018-09-28 MED FILL — CHOLESTYRAMINE PACKET: 4 | 30 days supply | Qty: 60 | Fill #0

## 2018-09-28 MED FILL — ATENOLOL 50 MG TABLET: 50 | 30 days supply | Qty: 30 | Fill #0

## 2018-09-28 MED FILL — methIMAzole 10 MG TABS: 10 | 30 days supply | Qty: 180 | Fill #0

## 2018-09-28 MED FILL — ELIQUIS 5 MG TABLET: 5 | 30 days supply | Qty: 60 | Fill #0

## 2018-09-28 MED FILL — predniSONE 10 MG TABS: 10 | 30 days supply | Qty: 30 | Fill #0

## 2018-09-28 NOTE — Progress Notes (Signed)
CSW met with patient at discharge. Patient has medications in hand, follow up appointments and given bus passes for upcoming appointments. CSW also completed a [employer] application for transportation needs long term. Patient struggles with get to and from the grocery store and [employer] will assist. Patient also provided number for medicaid transportation for medical appointments. Patient verbalizes understanding of follow up plan and will reach out to CSW if needs arise. CSW will refer patient to the [employer] program for support and medication adherence. CSW will continue to follow patient and assist with care coordination through the Paramedicine Program and HF Clinic. [name], [place], [place]

## 2018-09-28 NOTE — Care Management Note (Addendum)
Case Management Note  Patient Details  Name: Tara [name] MRN: [id number] Date of Birth: January 28, 1991  Subjective/Objective:   Pt admitted with Thyroid Storm - readmit and followed closely by HF team               Action/Plan:   PTA independent from home.  Pt has PCP and active medicaid.  HF team will provide beds to med prior to discharge.  HF team closely follows pt in the community - HF team to assess pt for para medicine program.    Expected Discharge Date:  09/28/18               Expected Discharge Plan:  Home/Self Care  In-House Referral:     Discharge planning Services  CM Consult  Post Acute Care Choice:    Choice offered to:     DME Arranged:    DME Agency:     HH Arranged:    HH Agency:     Status of Service:  Completed, signed off  If discussed at [employer] of Stay Meetings, dates discussed:    Additional Comments:  Cherylann Parr, RN 09/28/2018, 9:48 AM

## 2018-09-28 NOTE — Progress Notes (Signed)
The following medications were delivered to patient's bedside prior to discharge per prescriptions sent to [facility].  Methimazole 20 mg qid Eliquis 5 mg twice daily Cholestryramine Packets- 4gm twice daily Atenolol 50 mg  Prednisone 10 mg  Daily

## 2018-09-28 NOTE — Progress Notes (Addendum)
Advanced Heart Failure Rounding Note  PCP-Cardiologist: No primary care provider on file.   Subjective:    Admitted in A flutter and hyperthyroidism.   Endocrinology consulted 10/8 with recommendations for --> propanolol 80 mg tid, methimazole 20 mg qid, and cholestyramine 4 mg twice a day. Prednisone was also recommended.   Started on prednisone 10 mg daily yesterday. Propanolol switched to atenolol 50 mg daily to improve compliance. SBP 120-130s. HR better controlled, mostly NSR 70-80s.  ENT consulted for possible surgical intervention. Considering total thyroidectomy once symptoms under control. They will follow outpatient.   Feels fine. No CP, SOB, or orthopnea. Says she needs to leave today by 10:30.  Objective:   Weight Range: 108.3 kg Body mass index is 38.54 kg/m.   Vital Signs:   Temp:  [97.8 F (36.6 C)-98.5 F (36.9 C)] 97.8 F (36.6 C) (10/10 0328) Pulse Rate:  [58-88] 88 (10/10 0328) Resp:  [16-34] 32 (10/10 0700) BP: (122-132)/(55-69) 132/67 (10/10 0328) SpO2:  [93 %-99 %] 99 % (10/10 0328) Last BM Date: 09/27/18  Weight change: Filed Weights   09/25/18 1126 09/26/18 0452 09/27/18 0335  Weight: 109.1 kg 107.8 kg 108.3 kg    Intake/Output:  No intake or output data in the 24 hours ending 09/28/18 0808    Physical Exam    General:  No resp difficulty. HEENT: Exopthalmus Neck: Supple. JVP flat. Carotids 2+ bilat; no bruits. No thyromegaly or nodule noted. Cor: PMI nondisplaced. Tachy, regular, No M/G/R noted Lungs: CTAB, normal effort. Abdomen: Soft, non-tender, non-distended, no HSM. No bruits or masses. +BS  Extremities: No cyanosis, clubbing, or rash. R and LLE no edema.  Neuro: Alert & orientedx3, cranial nerves grossly intact. moves all 4 extremities w/o difficulty. Affect pleasant  Telemetry   NSR 70-80s, only a few short runs of sinus tach 120s. Personally reviewed.   EKG    NSR 79 bpm. Personally reviewed.   Labs     CBC Recent Labs    09/25/18 1232 09/26/18 0708  WBC 6.7 6.2  NEUTROABS 4.0  --   HGB 11.3* 11.3*  HCT 37.2 37.9  MCV 71.5* 72.3*  PLT 247 237   Basic Metabolic Panel Recent Labs    16/96/78 0302 09/28/18 0322  NA 136 136  K 4.0 4.0  CL 105 106  CO2 26 22  GLUCOSE 120* 108*  BUN 8 9  CREATININE 0.41* 0.40*  CALCIUM 9.1 9.2  MG 1.8  --    Liver Function Tests Recent Labs    09/25/18 1232  AST 23  ALT 18  ALKPHOS 112  BILITOT 0.7  PROT 6.8  ALBUMIN 3.0*   No results for input(s): LIPASE, AMYLASE in the last 72 hours. Cardiac Enzymes Recent Labs    09/25/18 1232  TROPONINI <0.03    BNP: BNP (last 3 results) Recent Labs    07/18/18 0222 09/11/18 1918 09/25/18 1232  BNP 164.9* 142.6* 69.1    ProBNP (last 3 results) No results for input(s): PROBNP in the last 8760 hours.   D-Dimer No results for input(s): DDIMER in the last 72 hours. Hemoglobin A1C Recent Labs    09/25/18 1232  HGBA1C 5.5   Fasting Lipid Panel No results for input(s): CHOL, HDL, LDLCALC, TRIG, CHOLHDL, LDLDIRECT in the last 72 hours. Thyroid Function Tests Recent Labs    09/25/18 1232  TSH <0.010*    Other results:   Imaging    No results found.   Medications:  Scheduled Medications: . apixaban  5 mg Oral BID  . atenolol  50 mg Oral Daily  . cholestyramine  4 g Oral BID  . furosemide  40 mg Oral Daily  . methimazole  20 mg Oral QID  . potassium chloride  40 mEq Oral BID  . predniSONE  10 mg Oral Q breakfast    Infusions:   PRN Medications: acetaminophen **OR** acetaminophen, benzocaine, ondansetron **OR** ondansetron (ZOFRAN) IV    Patient Profile   Tara [name] is a 27 year old with a history of untreated graves disease, diastolic heart failure, HTN, pregnancy complicated by pre-eclampsia, and SVT.   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   Assessment/Plan   1. Thyroid Storm  Per primary team. She has untreated  Graves disease.  -Prior to admit she was taking half the recommended dose of methimazole.   She has not had endocrinology follow up in the community.  -Encrinology consulted. --> Continue  methimazole 20 mg qid, cholestryamin 4 gram twice a day, and prednisone 10 mg daily.  - Propanolol switched to atenolol to improve compliance.  - Endocrinology appreciated.  - ENT will follow up outpatient for possible total thyroidectomy.   2. New Onset A flutter RVR Back in regular rhythm but rate uncontrolled.   -Continue eliquis 5 mg twice a day. Has 30 day free card. -Would not use amiodarone due to hyperthyroidism.   3. A/C Diastolic Heart Failure  ECHO 06/2018 EF ~60%.  - Volume status stable.  - Continue 40 mg lasix daily.  - Renal function stable.  - Repeat echo ordered for this morning. She needs to leave by 10:30 to pick up her son. I have called echo to see if they can get her in this morning.  4. HTN  Resolved with medications. SBP 120-130s  5. Weight loss In the setting of hyperthyroidism. No change.   6. Social  Noncompliance due to poor socio-economics. She has intermittent insurance. When she works she loses Medicaid. Has transportation and food issues.  She will need 30 day supple of medications prior to discharge. We will get her medications from outpatient pharmacy. She needs to get these before leaving today.   Will arrange HF follow up and make sure she gets her medications prior to discharge.   Length of Stay: 3  Alford Highland, NP  09/28/2018, 8:08 AM  Advanced Heart Failure Team Pager [phone] (M-F; 7a - 4p)  Please contact [facility] for night-coverage after hours (4p -7a ) and weekends on [email or web address]   Patient seen and examined with the above-signed Advanced Practice Provider and/or Housestaff. I personally reviewed laboratory data, imaging studies and relevant notes. I independently examined the patient and formulated the important aspects of the plan. I  have edited the note to reflect any of my changes or salient points. I have personally discussed the plan with the patient and/or family.  HR much improved with control of hyperthyroidism. Continue atenolol, Eliquis and lasix. Watch volume status on prednisone. Wi;l try to get echo prior to d/c to make sure LV function stable in setting of tachycardia.   Arvilla Meres, MD  9:27 AM

## 2018-09-28 NOTE — Progress Notes (Signed)
  Echocardiogram 2D Echocardiogram has been performed.  Tara [name] 09/28/2018, 9:25 AM

## 2018-09-28 NOTE — Plan of Care (Signed)

## 2018-09-28 NOTE — Progress Notes (Signed)
Follow-up appointment scheduled in the Advanced Heart Failure Clinic for October 23rd at 11:00AM.

## 2018-09-28 NOTE — Progress Notes (Signed)
Discharge instructions (including medications) discussed with and copy provided to patient/caregiver. Walked out with HF Child psychotherapist.

## 2018-09-28 NOTE — Discharge Instructions (Signed)
It has been a pleasure taking care of you! You were admitted due to thyroid issues. We have treated you with heart rate and thyroid controlling medications. With that your symptoms improved to the point we think it is safe to let you go home and follow up with your primary care doctor.   We are discharging you on methimazole 60mg  daily and atenolol 50mg  daily and prednisone 10mg  daily that you need to continue taking after you leave the hospital.  There could be some changes made to your home medications during this hospitalization. Please, make sure to read the directions before you take them. The names and directions on how to take these medications are found on this discharge paper under medication section.  Please follow up with your doctors after you leave the hospital. This is very important as your medications may need to be adjusted. You will need to go to your primary care, your heart doctor and your endocrinologist Dr [name]. You will also need to see an ear nose and throat doctor as well. The address, date and time are found on the discharge paper under follow up section.    Information on my medicine - ELIQUIS (apixaban)  Why was Eliquis prescribed for you? Eliquis was prescribed for you to reduce the risk of forming blood clots that can cause a stroke if you have a medical condition called atrial fibrillation (a type of irregular heartbeat).  What do You need to know about Eliquis ? Take your Eliquis TWICE DAILY - one tablet in the morning and one tablet in the evening with or without food.  It would be best to take the doses about the same time each day.  If you have difficulty swallowing the tablet whole please discuss with your pharmacist how to take the medication safely.  Take Eliquis exactly as prescribed by your doctor and DO NOT stop taking Eliquis without talking to the doctor who prescribed the medication.  Stopping may increase your risk of developing a new clot or  stroke.  Refill your prescription before you run out.  After discharge, you should have regular check-up appointments with your healthcare provider that is prescribing your Eliquis.  In the future your dose may need to be changed if your kidney function or weight changes by a significant amount or as you get older.  What do you do if you miss a dose? If you miss a dose, take it as soon as you remember on the same day and resume taking twice daily.  Do not take more than one dose of ELIQUIS at the same time.  Important Safety Information A possible side effect of Eliquis is bleeding. You should call your healthcare provider right away if you experience any of the following: ? Bleeding from an injury or your nose that does not stop. ? Unusual colored urine (red or dark brown) or unusual colored stools (red or black). ? Unusual bruising for unknown reasons. ? A serious fall or if you hit your head (even if there is no bleeding).  Some medicines may interact with Eliquis and might increase your risk of bleeding or clotting while on Eliquis. To help avoid this, consult your healthcare provider or pharmacist prior to using any new prescription or non-prescription medications, including herbals, vitamins, non-steroidal anti-inflammatory drugs (NSAIDs) and supplements.  This website has more information on Eliquis (apixaban): [email or web address].com.cy.

## 2018-10-03 ENCOUNTER — Telehealth (HOSPITAL_COMMUNITY): Payer: Self-pay

## 2018-10-03 NOTE — Telephone Encounter (Signed)
I called Ms Ruis to schedule our initial appointment. She did not answer so I left a voicemail stating my information and purpose for calling and requested that she call me back.

## 2018-10-04 ENCOUNTER — Ambulatory Visit: Payer: Medicaid Other | Admitting: Family Medicine

## 2018-10-10 ENCOUNTER — Other Ambulatory Visit (HOSPITAL_COMMUNITY): Payer: Self-pay | Admitting: Cardiology

## 2018-10-10 NOTE — Telephone Encounter (Signed)
Opened in error

## 2018-10-11 ENCOUNTER — Telehealth (HOSPITAL_COMMUNITY): Payer: Self-pay

## 2018-10-11 ENCOUNTER — Inpatient Hospital Stay (HOSPITAL_COMMUNITY): Payer: Medicaid Other

## 2018-10-11 ENCOUNTER — Telehealth (HOSPITAL_COMMUNITY): Payer: Self-pay | Admitting: Licensed Clinical Social Worker

## 2018-10-11 NOTE — Telephone Encounter (Signed)
Annice Pih contacted me to let me know Ms Emmanuel had cancelled her appointment for this morning. She advised that she was going to see if she could do a home visit with me and said she would call me back after speaking about it with Ms Harper.

## 2018-10-11 NOTE — Telephone Encounter (Signed)
CSW received call from patient stating she is not feeling well and unable to make her appointment today. CSW encouraged patient to come to today's appointment but was unsuccessful. Patient rescheduled for October 31 st at noon. CSW will follow up with patient at that time. [name], LCSW, CCSW-MCS [phone]

## 2018-10-11 NOTE — Telephone Encounter (Signed)
I sent a message to Annice Pih to ask about meeting with Ms Skrzypczak. She stated that Ms Reining would not be able to meet today but was able to relay a message to Ms Suggs and we scheduled a home visit for Friday at 11:00.

## 2018-10-13 ENCOUNTER — Ambulatory Visit (INDEPENDENT_AMBULATORY_CARE_PROVIDER_SITE_OTHER): Payer: Medicaid Other | Admitting: *Deleted

## 2018-10-13 ENCOUNTER — Other Ambulatory Visit (HOSPITAL_COMMUNITY): Payer: Self-pay

## 2018-10-13 DIAGNOSIS — Z111 Encounter for screening for respiratory tuberculosis: Secondary | ICD-10-CM
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
## 2018-10-13 NOTE — Progress Notes (Signed)
I returned to Tara [name]'s apartment to deliver a pillbox to her. I asked if she wanted me to fill it for her but she said she was able to do it herself. Nothing further was required from my visit.

## 2018-10-13 NOTE — Progress Notes (Signed)
Patient is here for a PPD placement.  PPD placed in left forearm above tattoo.  Patient will return 10/16/2018 to have PPD read. Shamar Kracke, Maryjo Rochester, CMA

## 2018-10-16 ENCOUNTER — Ambulatory Visit (INDEPENDENT_AMBULATORY_CARE_PROVIDER_SITE_OTHER): Payer: Medicaid Other

## 2018-10-16 DIAGNOSIS — Z111 Encounter for screening for respiratory tuberculosis: Secondary | ICD-10-CM | POA: Diagnosis not present

## 2018-10-16 LAB — TB SKIN TEST
INDURATION: 0 mm
TB SKIN TEST: NEGATIVE

## 2018-10-16 NOTE — Progress Notes (Signed)
   Patient here today to have PPD site read.   PPD read and results entered in Epic. Result: 0 mm induration. Interpretation: Negative Letters given for employer. Patient also requested [facility] record which was given.  Ples Specter, RN [facility] RN)

## 2018-10-19 ENCOUNTER — Encounter (HOSPITAL_COMMUNITY): Payer: Self-pay

## 2018-10-19 ENCOUNTER — Other Ambulatory Visit: Payer: Self-pay

## 2018-10-19 ENCOUNTER — Ambulatory Visit (HOSPITAL_COMMUNITY)
Admission: RE | Admit: 2018-10-19 | Discharge: 2018-10-19 | Disposition: A | Discharge status: Home / Self Care | Payer: Medicaid Other | Source: Ambulatory Visit | Attending: Internal Medicine | Admitting: Internal Medicine

## 2018-10-19 ENCOUNTER — Encounter (HOSPITAL_COMMUNITY): Payer: Self-pay | Admitting: *Deleted

## 2018-10-19 VITALS — BP 124/80 | HR 80 | Wt 259.8 lb

## 2018-10-19 DIAGNOSIS — I110 Hypertensive heart disease with heart failure: Secondary | ICD-10-CM | POA: Diagnosis not present

## 2018-10-19 DIAGNOSIS — Z79899 Other long term (current) drug therapy: Secondary | ICD-10-CM | POA: Diagnosis not present

## 2018-10-19 DIAGNOSIS — E0500 Thyrotoxicosis with diffuse goiter without thyrotoxic crisis or storm: Secondary | ICD-10-CM | POA: Diagnosis not present

## 2018-10-19 DIAGNOSIS — E119 Type 2 diabetes mellitus without complications: Secondary | ICD-10-CM | POA: Insufficient documentation

## 2018-10-19 DIAGNOSIS — Z8249 Family history of ischemic heart disease and other diseases of the circulatory system: Secondary | ICD-10-CM | POA: Diagnosis not present

## 2018-10-19 DIAGNOSIS — F4321 Adjustment disorder with depressed mood: Secondary | ICD-10-CM | POA: Diagnosis not present

## 2018-10-19 DIAGNOSIS — I4510 Unspecified right bundle-branch block: Secondary | ICD-10-CM | POA: Diagnosis not present

## 2018-10-19 DIAGNOSIS — I252 Old myocardial infarction: Secondary | ICD-10-CM | POA: Insufficient documentation

## 2018-10-19 DIAGNOSIS — I4892 Unspecified atrial flutter: Secondary | ICD-10-CM | POA: Diagnosis not present

## 2018-10-19 DIAGNOSIS — I5032 Chronic diastolic (congestive) heart failure: Secondary | ICD-10-CM | POA: Diagnosis present

## 2018-10-19 DIAGNOSIS — R5383 Other fatigue: Secondary | ICD-10-CM | POA: Insufficient documentation

## 2018-10-19 DIAGNOSIS — Z7952 Long term (current) use of systemic steroids: Secondary | ICD-10-CM | POA: Diagnosis not present

## 2018-10-19 DIAGNOSIS — Z7901 Long term (current) use of anticoagulants: Secondary | ICD-10-CM | POA: Insufficient documentation

## 2018-10-19 DIAGNOSIS — I10 Essential (primary) hypertension: Secondary | ICD-10-CM

## 2018-10-19 LAB — BASIC METABOLIC PANEL
Anion gap: 8 (ref 5–15)
BUN: 5 mg/dL — ABNORMAL LOW (ref 6–20)
CALCIUM: 9 mg/dL (ref 8.9–10.3)
CO2: 27 mmol/L (ref 22–32)
CREATININE: 0.52 mg/dL (ref 0.44–1.00)
Chloride: 105 mmol/L (ref 98–111)
GFR calc Af Amer: >60 mL/min (ref >60)
Glucose, Bld: 95 mg/dL (ref 70–99)
Potassium: 4.3 mmol/L (ref 3.5–5.1)
Sodium: 140 mmol/L (ref 135–145)

## 2018-10-19 MED ORDER — FUROSEMIDE 40 MG PO TABS: ORAL_TABLET | ORAL | 0 refills | Status: DC

## 2018-10-19 MED ORDER — POTASSIUM CHLORIDE ER 10 MEQ PO TBCR: 20.0000 meq | EXTENDED_RELEASE_TABLET | ORAL | 0 refills | Status: DC | PRN

## 2018-10-19 MED FILL — POTASSIUM CHLORIDE CRYS ER: 10 | 15 days supply | Qty: 30 | Fill #0

## 2018-10-19 MED FILL — FUROSEMIDE 40 MG TAB: 40 | 30 days supply | Qty: 120 | Fill #0

## 2018-10-19 NOTE — Patient Instructions (Addendum)
Today you have been seen at the Heart failure clinic at [facility]   Medication changes: Lasix 80mg  twice a day for 2 days then start 40 mg twice a day  Potassium 20mg  as needed  You had Lab work done today:  Scheduled Lab work: 11/02/18 at 12:15  We will call you if your lab work is abnormal.. No news is good news!!   Follow up with NP/PA in 4 weeks 11/20/18 at 11:30   Do the following things EVERYDAY: 1) Weigh yourself in the morning before breakfast. Write it down and keep it in a log. 2) Take your medicines as prescribed 3) Eat low salt foods-Limit salt (sodium) to 2000 mg per day.  4) Stay as active as you can everyday 5) Limit all fluids for the day to less than 2 liters
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
## 2018-10-19 NOTE — Progress Notes (Signed)
CSW met with patient in the clinic. Patient reports she was taking her lasix inappropriately. Patient now verbalizes understanding of medication and hopeful to stay on track with her health. Patient has transportation and will pick up medication at the pharmacy on her way home. Patient continues to be followed by [facility]. CSW available as needed. [name], [place], [place]

## 2018-10-19 NOTE — Addendum Note (Signed)
Encounter addended by: Marcy Siren, LCSW on: 10/19/2018 3:39 PM  Actions taken: Visit Navigator Flowsheet section accepted

## 2018-10-20 ENCOUNTER — Telehealth (HOSPITAL_COMMUNITY): Payer: Self-pay

## 2018-10-20 NOTE — Telephone Encounter (Signed)
I sent Ms Tara [name] a text to see if she was available for a meeting today. She stated she was on the way to [place], [place] and would not be back until Monday. We agreed to meet next Tuesday at 09:00.

## 2018-10-23 ENCOUNTER — Encounter (HOSPITAL_COMMUNITY): Payer: Self-pay | Admitting: *Deleted

## 2018-10-25 ENCOUNTER — Telehealth (HOSPITAL_COMMUNITY): Payer: Self-pay

## 2018-10-26 NOTE — Telephone Encounter (Signed)
I called Tara [name] to schedule an appointment. She did not answer and no voicemail was available to leave a message.

## 2018-10-27 ENCOUNTER — Telehealth (HOSPITAL_COMMUNITY): Payer: Self-pay

## 2018-10-27 NOTE — Telephone Encounter (Signed)
I reached out to Tara [name] this morning via text to try and schedule an appointment. She had not replied by the time of this note, 08:55

## 2018-10-28 ENCOUNTER — Emergency Department (HOSPITAL_COMMUNITY)
Admission: EM | Admit: 2018-10-28 | Discharge: 2018-10-28 | Disposition: A | Discharge status: Home / Self Care | Payer: Medicaid Other | Attending: Emergency Medicine | Admitting: Emergency Medicine

## 2018-10-28 ENCOUNTER — Encounter (HOSPITAL_COMMUNITY): Payer: Self-pay | Admitting: *Deleted

## 2018-10-28 ENCOUNTER — Other Ambulatory Visit: Payer: Self-pay

## 2018-10-28 DIAGNOSIS — E119 Type 2 diabetes mellitus without complications: Secondary | ICD-10-CM | POA: Diagnosis not present

## 2018-10-28 DIAGNOSIS — R197 Diarrhea, unspecified: Secondary | ICD-10-CM | POA: Insufficient documentation

## 2018-10-28 DIAGNOSIS — I252 Old myocardial infarction: Secondary | ICD-10-CM | POA: Insufficient documentation

## 2018-10-28 DIAGNOSIS — I5032 Chronic diastolic (congestive) heart failure: Secondary | ICD-10-CM | POA: Insufficient documentation

## 2018-10-28 DIAGNOSIS — I110 Hypertensive heart disease with heart failure: Secondary | ICD-10-CM | POA: Insufficient documentation

## 2018-10-28 DIAGNOSIS — Z7901 Long term (current) use of anticoagulants: Secondary | ICD-10-CM | POA: Diagnosis not present

## 2018-10-28 DIAGNOSIS — Z79899 Other long term (current) drug therapy: Secondary | ICD-10-CM | POA: Insufficient documentation

## 2018-10-28 DIAGNOSIS — Z91040 Latex allergy status: Secondary | ICD-10-CM | POA: Insufficient documentation

## 2018-10-28 DIAGNOSIS — R112 Nausea with vomiting, unspecified: Secondary | ICD-10-CM | POA: Diagnosis not present

## 2018-10-28 LAB — COMPREHENSIVE METABOLIC PANEL
ALK PHOS: 131 U/L — AB (ref 38–126)
ALT: 16 U/L (ref 0–44)
ANION GAP: 9 (ref 5–15)
AST: 25 U/L (ref 15–41)
Albumin: 3.5 g/dL (ref 3.5–5.0)
BILIRUBIN TOTAL: 1.2 mg/dL (ref 0.3–1.2)
BUN: 8 mg/dL (ref 6–20)
CO2: 22 mmol/L (ref 22–32)
Calcium: 8.9 mg/dL (ref 8.9–10.3)
Chloride: 102 mmol/L (ref 98–111)
Creatinine, Ser: 0.56 mg/dL (ref 0.44–1.00)
GFR calc Af Amer: >60 mL/min (ref >60)
Glucose, Bld: 105 mg/dL — ABNORMAL HIGH (ref 70–99)
POTASSIUM: 3.9 mmol/L (ref 3.5–5.1)
Sodium: 133 mmol/L — ABNORMAL LOW (ref 135–145)
TOTAL PROTEIN: 7.9 g/dL (ref 6.5–8.1)

## 2018-10-28 LAB — CBC WITH DIFFERENTIAL/PLATELET
Abs Immature Granulocytes: 0.02 10*3/uL (ref 0.00–0.07)
BASOS ABS: 0 10*3/uL (ref 0.0–0.1)
Basophils Relative: 0 %
EOS ABS: 0.3 10*3/uL (ref 0.0–0.5)
Eosinophils Relative: 3 %
HEMATOCRIT: 47.2 % — AB (ref 36.0–46.0)
Hemoglobin: 13.6 g/dL (ref 12.0–15.0)
IMMATURE GRANULOCYTES: 0 %
LYMPHS ABS: 2.3 10*3/uL (ref 0.7–4.0)
Lymphocytes Relative: 25 %
MCH: 21.3 pg — ABNORMAL LOW (ref 26.0–34.0)
MCHC: 28.8 g/dL — ABNORMAL LOW (ref 30.0–36.0)
MCV: 73.9 fL — AB (ref 80.0–100.0)
Monocytes Absolute: 0.6 10*3/uL (ref 0.1–1.0)
Monocytes Relative: 7 %
NEUTROS PCT: 65 %
NRBC: 0 % (ref 0.0–0.2)
Neutro Abs: 5.8 10*3/uL (ref 1.7–7.7)
Platelets: 288 10*3/uL (ref 150–400)
RBC: 6.39 MIL/uL — ABNORMAL HIGH (ref 3.87–5.11)
RDW: 17.9 % — AB (ref 11.5–15.5)
WBC: 9 10*3/uL (ref 4.0–10.5)

## 2018-10-28 LAB — LIPASE, BLOOD: LIPASE: 29 U/L (ref 11–51)

## 2018-10-28 MED ORDER — ONDANSETRON HCL 4 MG PO TABS: 4.0000 mg | ORAL_TABLET | Freq: Three times a day (TID) | ORAL | 0 refills | Status: DC | PRN

## 2018-10-28 MED ORDER — SODIUM CHLORIDE 0.9 % IV BOLUS
1000.0000 mL | Freq: Once | INTRAVENOUS | Status: AC
  Administered 2018-10-28: 1000 mL via INTRAVENOUS

## 2018-10-28 MED ORDER — LOPERAMIDE HCL 2 MG PO CAPS
4.0000 mg | ORAL_CAPSULE | Freq: Once | ORAL | Status: AC
  Administered 2018-10-28: 4 mg via ORAL
  Filled 2018-10-28: qty 2

## 2018-10-28 MED ORDER — ONDANSETRON HCL 4 MG/2ML IJ SOLN
4.0000 mg | Freq: Once | INTRAMUSCULAR | Status: AC
  Administered 2018-10-28: 4 mg via INTRAVENOUS
  Filled 2018-10-28: qty 2

## 2018-10-28 MED ORDER — LOPERAMIDE HCL 2 MG PO CAPS: 2.0000 mg | ORAL_CAPSULE | Freq: Four times a day (QID) | ORAL | 0 refills | Status: DC | PRN

## 2018-10-28 NOTE — ED Triage Notes (Signed)
PT reports this is day 2 with N/V/D. ,Pt also has a HA. Pt reports her son has diarrhea.

## 2018-10-28 NOTE — ED Notes (Signed)
Pt verbalized understanding of discharge paperwork, prescriptions and follow-up care 

## 2018-10-28 NOTE — Discharge Instructions (Addendum)
You were seen in the emergency department for nausea vomiting and diarrhea.  Your lab work was unremarkable and you were improved after some medications and fluids.  You will need to continue to stay well-hydrated.  We are prescribing you some nausea medicine and some diarrhea medicine.  Please follow-up with your doctor and return if any worsening symptoms.
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
## 2018-10-30 MED FILL — LOPERAMIDE 2 MG CAPSULE: 2 | 3 days supply | Qty: 12 | Fill #0

## 2018-10-30 MED FILL — ONDANSETRON HCL 4 MG TABLET: 4 | 4 days supply | Qty: 12 | Fill #0

## 2018-11-01 ENCOUNTER — Other Ambulatory Visit: Payer: Self-pay | Admitting: Internal Medicine

## 2018-11-01 ENCOUNTER — Other Ambulatory Visit: Payer: Self-pay | Admitting: Family Medicine

## 2018-11-01 ENCOUNTER — Telehealth (HOSPITAL_COMMUNITY): Payer: Self-pay

## 2018-11-02 ENCOUNTER — Ambulatory Visit (HOSPITAL_COMMUNITY)
Admission: RE | Admit: 2018-11-02 | Discharge: 2018-11-02 | Disposition: A | Discharge status: Home / Self Care | Payer: Medicaid Other | Source: Ambulatory Visit | Attending: Cardiology | Admitting: Cardiology

## 2018-11-02 ENCOUNTER — Other Ambulatory Visit (HOSPITAL_COMMUNITY): Payer: Self-pay | Admitting: Cardiology

## 2018-11-02 ENCOUNTER — Other Ambulatory Visit: Payer: Self-pay | Admitting: Family Medicine

## 2018-11-02 DIAGNOSIS — I5032 Chronic diastolic (congestive) heart failure: Secondary | ICD-10-CM | POA: Diagnosis not present

## 2018-11-02 LAB — BASIC METABOLIC PANEL
ANION GAP: 8 (ref 5–15)
BUN: 8 mg/dL (ref 6–20)
CHLORIDE: 103 mmol/L (ref 98–111)
CO2: 25 mmol/L (ref 22–32)
Calcium: 8.6 mg/dL — ABNORMAL LOW (ref 8.9–10.3)
Creatinine, Ser: 0.82 mg/dL (ref 0.44–1.00)
GFR calc Af Amer: >60 mL/min (ref >60)
GLUCOSE: 104 mg/dL — AB (ref 70–99)
POTASSIUM: 3.3 mmol/L — AB (ref 3.5–5.1)
Sodium: 136 mmol/L (ref 135–145)

## 2018-11-02 MED ORDER — ATENOLOL 50 MG PO TABS: 50.0000 mg | ORAL_TABLET | Freq: Every day | ORAL | 6 refills | Status: DC

## 2018-11-02 MED ORDER — APIXABAN 5 MG PO TABS: 5.0000 mg | ORAL_TABLET | Freq: Two times a day (BID) | ORAL | 6 refills | Status: DC

## 2018-11-02 MED ORDER — POTASSIUM CHLORIDE ER 10 MEQ PO TBCR: 10.0000 meq | EXTENDED_RELEASE_TABLET | Freq: Every day | ORAL | 6 refills | Status: DC

## 2018-11-02 MED ORDER — FUROSEMIDE 40 MG PO TABS: 40.0000 mg | ORAL_TABLET | Freq: Two times a day (BID) | ORAL | 6 refills | Status: DC

## 2018-11-02 MED FILL — FUROSEMIDE 40 MG TAB: 40 | 40 days supply | Qty: 120 | Fill #0

## 2018-11-02 MED FILL — ELIQUIS 5 MG TABLET: 5 | 30 days supply | Qty: 60 | Fill #0

## 2018-11-02 MED FILL — ATENOLOL 50 MG TABLET: 50 | 30 days supply | Qty: 30 | Fill #0

## 2018-11-02 MED FILL — POTASSIUM CHLORIDE CRYS ER: 10 | 30 days supply | Qty: 30 | Fill #0

## 2018-11-02 NOTE — Addendum Note (Signed)
Encounter addended by: Marcy Siren, LCSW on: 11/02/2018 2:26 PM  Actions taken: Sign clinical note

## 2018-11-02 NOTE — Progress Notes (Signed)
CSW met with patient in the clinic to assist with medication refills and navigating some healthcare appointments. CSW provided support and assistance with medications and bus passes for upcoming appointments.Patient reports she has an appointment with Dr. Buddy Duty endocrinologist next week.  CSW continues to be available as needed. [name], [place], [place]

## 2018-11-02 NOTE — Telephone Encounter (Signed)
I called Tara [name] to schedule an appointment. Her phone went directly to voicemail so I left a message requesting she call me back. She later sent a text message and stated she would be available Friday morning. We agreed to meet at 11:30.

## 2018-11-03 ENCOUNTER — Other Ambulatory Visit (HOSPITAL_COMMUNITY): Payer: Self-pay

## 2018-11-03 ENCOUNTER — Other Ambulatory Visit: Payer: Self-pay

## 2018-11-03 ENCOUNTER — Telehealth (HOSPITAL_COMMUNITY): Payer: Self-pay | Admitting: Cardiology

## 2018-11-03 DIAGNOSIS — I5032 Chronic diastolic (congestive) heart failure: Secondary | ICD-10-CM

## 2018-11-03 MED ORDER — POTASSIUM CHLORIDE ER 10 MEQ PO TBCR: 30.0000 meq | EXTENDED_RELEASE_TABLET | Freq: Every day | ORAL | 6 refills | Status: DC

## 2018-11-03 NOTE — Telephone Encounter (Signed)
error 

## 2018-11-03 NOTE — Telephone Encounter (Signed)
-----   Message from Dolores Patty, MD sent at 11/02/2018  7:30 PM EST ----- Increase kcl to 30 daily. Take 60 meq tomorrow   Repeat 1 week

## 2018-11-03 NOTE — Telephone Encounter (Signed)
Notes recorded by Theresia Bough, CMA on 11/03/2018 at 12:25 PM EST Patient aware. Patient voiced understanding   ------  Notes recorded by Theresia Bough, CMA on 11/03/2018 at 11:28 AM EST Left message for patient to call back.  [phone] (M) ------  Notes recorded by Dolores Patty, MD on 11/02/2018 at 7:30 PM EST Increase kcl to 30 daily. Take 60 meq tomorrow  Repeat 1 week

## 2018-11-03 NOTE — Progress Notes (Signed)
I arrived at Tara [name]'s apartment this morning for our scheduled appointment. I knocked multiple times as I could her voices from the other side of the door. After waiting several minutes a child came to the door and said "She's sleeping." I asked they tell Tara [name] to call me when she wakes.

## 2018-11-04 NOTE — Telephone Encounter (Signed)
Please call patient and clarify if she is out of prednisone and methimazole. The eliqiuis and atenolol are prescribed by cardiology and refilled on 11/02/18. Prednisone and methimazole should be managed by her endocrinologist, and per CSW notes she reports an appointment with Dr. [name] next week. Please schedule her a follow up with me soon as well.

## 2018-11-06 ENCOUNTER — Other Ambulatory Visit: Payer: Self-pay | Admitting: Family Medicine

## 2018-11-06 NOTE — Telephone Encounter (Signed)
Called patient to discuss refills. She states she ran out of methimazole last week, has endocrinology appointment. 11/20 is her endocrinology appointment. She says she may as well wait until her appointment with endocrinology, which I agree with.   She states she is not on any birth control at present (see recent ED visit with N/V and no UPT) and not sexually active.   Now she states her medicaid is "straight." she would like to see me to discuss meds and concerns of asthma. Made her my next available appointment.

## 2018-11-06 NOTE — Telephone Encounter (Signed)
White team, did y'all call patient? I saw this was routed back to me.

## 2018-11-10 ENCOUNTER — Ambulatory Visit (HOSPITAL_COMMUNITY)
Admission: RE | Admit: 2018-11-10 | Discharge: 2018-11-10 | Disposition: A | Discharge status: Home / Self Care | Payer: Medicaid Other | Source: Ambulatory Visit | Attending: Internal Medicine | Admitting: Internal Medicine

## 2018-11-10 ENCOUNTER — Telehealth (HOSPITAL_COMMUNITY): Payer: Self-pay

## 2018-11-10 DIAGNOSIS — I5032 Chronic diastolic (congestive) heart failure: Secondary | ICD-10-CM | POA: Diagnosis present

## 2018-11-10 LAB — BASIC METABOLIC PANEL
Anion gap: 8 (ref 5–15)
BUN: 5 mg/dL — ABNORMAL LOW (ref 6–20)
CHLORIDE: 102 mmol/L (ref 98–111)
CO2: 27 mmol/L (ref 22–32)
Calcium: 8.9 mg/dL (ref 8.9–10.3)
Creatinine, Ser: 0.65 mg/dL (ref 0.44–1.00)
Glucose, Bld: 111 mg/dL — ABNORMAL HIGH (ref 70–99)
Potassium: 3.3 mmol/L — ABNORMAL LOW (ref 3.5–5.1)
SODIUM: 137 mmol/L (ref 135–145)

## 2018-11-10 MED ORDER — POTASSIUM CHLORIDE ER 10 MEQ PO TBCR: 30.0000 meq | EXTENDED_RELEASE_TABLET | Freq: Two times a day (BID) | ORAL | 6 refills | Status: DC

## 2018-11-10 NOTE — Telephone Encounter (Signed)
Pt called with lab results and medication changes

## 2018-11-13 ENCOUNTER — Telehealth (HOSPITAL_COMMUNITY): Payer: Self-pay

## 2018-11-13 NOTE — Telephone Encounter (Signed)
I called Tara [name] to schedule an appointment. She stated she would be free "whenever" so I asked if she had a preferred day. I explained that I would not be in on Thursday due to the holiday so she asked me to come Friday morning "as early as possible." We agreed to meet at 09:00 on Friday.

## 2018-11-17 ENCOUNTER — Other Ambulatory Visit (HOSPITAL_COMMUNITY): Payer: Self-pay

## 2018-11-17 MED FILL — methIMAzole 10 MG TABS: 10 | 30 days supply | Qty: 180 | Fill #0
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
## 2018-11-17 NOTE — Progress Notes (Signed)
Tara [name] was seen at home this afternoon for a follow up visit. She was found to be in a sinus arrhythmia with a rate around 110 bpm. I advised her again to go to the hospital for treatment and she again refused. I told her that she should seek immediate medical attention if she beings feeling worse and she said she understood. I advised not going to the ED for treatment was against the medical advice of myself and the HF clinic providers and she expressed understanding. I will follow up at the beginning of next week.

## 2018-11-20 ENCOUNTER — Other Ambulatory Visit (HOSPITAL_COMMUNITY): Payer: Medicaid Other

## 2018-11-20 ENCOUNTER — Inpatient Hospital Stay (HOSPITAL_COMMUNITY): Admission: RE | Admit: 2018-11-20 | Payer: Medicaid Other | Source: Ambulatory Visit

## 2018-11-29 ENCOUNTER — Telehealth (HOSPITAL_COMMUNITY): Payer: Self-pay | Admitting: Surgery

## 2018-11-29 NOTE — Telephone Encounter (Signed)
Patient discussed at multidisciplinary meeting and will be discharged at this time from the HF Community Paramedicine program secondary to inability to reach to schedule home visits.  Tara [name] is aware to call the [facility] for any needs related to her HF.

## 2018-12-06 ENCOUNTER — Ambulatory Visit: Payer: Medicaid Other | Admitting: Family Medicine

## 2018-12-08 ENCOUNTER — Other Ambulatory Visit (HOSPITAL_COMMUNITY): Payer: Self-pay | Admitting: Family Medicine

## 2018-12-08 NOTE — Telephone Encounter (Signed)
Will not refill. Patient no showed our last appt (a few days ago), and this was originally prescribed at the consultants recommendation while inpatient. Please call and offer her a follow up appointment.

## 2018-12-27 ENCOUNTER — Encounter (HOSPITAL_COMMUNITY): Payer: Self-pay | Admitting: Cardiology

## 2018-12-27 ENCOUNTER — Other Ambulatory Visit: Payer: Self-pay

## 2018-12-27 ENCOUNTER — Telehealth (HOSPITAL_COMMUNITY): Payer: Self-pay

## 2018-12-27 ENCOUNTER — Other Ambulatory Visit (HOSPITAL_COMMUNITY): Payer: Self-pay

## 2018-12-27 ENCOUNTER — Ambulatory Visit (HOSPITAL_COMMUNITY)
Admission: RE | Admit: 2018-12-27 | Discharge: 2018-12-27 | Disposition: A | Discharge status: Home / Self Care | Payer: Medicaid Other | Source: Ambulatory Visit | Attending: Internal Medicine | Admitting: Internal Medicine

## 2018-12-27 VITALS — BP 134/96 | HR 85 | Wt 277.0 lb

## 2018-12-27 DIAGNOSIS — I5032 Chronic diastolic (congestive) heart failure: Secondary | ICD-10-CM

## 2018-12-27 DIAGNOSIS — I272 Pulmonary hypertension, unspecified: Secondary | ICD-10-CM | POA: Diagnosis not present

## 2018-12-27 DIAGNOSIS — E0500 Thyrotoxicosis with diffuse goiter without thyrotoxic crisis or storm: Secondary | ICD-10-CM | POA: Insufficient documentation

## 2018-12-27 DIAGNOSIS — I4510 Unspecified right bundle-branch block: Secondary | ICD-10-CM | POA: Diagnosis not present

## 2018-12-27 DIAGNOSIS — F4321 Adjustment disorder with depressed mood: Secondary | ICD-10-CM | POA: Diagnosis not present

## 2018-12-27 DIAGNOSIS — Z8249 Family history of ischemic heart disease and other diseases of the circulatory system: Secondary | ICD-10-CM | POA: Diagnosis not present

## 2018-12-27 DIAGNOSIS — Z7901 Long term (current) use of anticoagulants: Secondary | ICD-10-CM | POA: Insufficient documentation

## 2018-12-27 DIAGNOSIS — E669 Obesity, unspecified: Secondary | ICD-10-CM | POA: Insufficient documentation

## 2018-12-27 DIAGNOSIS — E119 Type 2 diabetes mellitus without complications: Secondary | ICD-10-CM | POA: Insufficient documentation

## 2018-12-27 DIAGNOSIS — I110 Hypertensive heart disease with heart failure: Secondary | ICD-10-CM | POA: Insufficient documentation

## 2018-12-27 DIAGNOSIS — I5083 High output heart failure: Secondary | ICD-10-CM | POA: Diagnosis not present

## 2018-12-27 DIAGNOSIS — I252 Old myocardial infarction: Secondary | ICD-10-CM | POA: Insufficient documentation

## 2018-12-27 DIAGNOSIS — R9431 Abnormal electrocardiogram [ECG] [EKG]: Secondary | ICD-10-CM | POA: Diagnosis not present

## 2018-12-27 DIAGNOSIS — I4892 Unspecified atrial flutter: Secondary | ICD-10-CM | POA: Diagnosis not present

## 2018-12-27 DIAGNOSIS — I10 Essential (primary) hypertension: Secondary | ICD-10-CM | POA: Diagnosis not present

## 2018-12-27 DIAGNOSIS — Z79899 Other long term (current) drug therapy: Secondary | ICD-10-CM | POA: Insufficient documentation

## 2018-12-27 LAB — BASIC METABOLIC PANEL
ANION GAP: 9 (ref 5–15)
BUN: <5 mg/dL — ABNORMAL LOW (ref 6–20)
CALCIUM: 8.8 mg/dL — AB (ref 8.9–10.3)
CO2: 26 mmol/L (ref 22–32)
Chloride: 102 mmol/L (ref 98–111)
Creatinine, Ser: 0.6 mg/dL (ref 0.44–1.00)
GFR calc non Af Amer: >60 mL/min (ref >60)
Glucose, Bld: 93 mg/dL (ref 70–99)
Potassium: 2.9 mmol/L — ABNORMAL LOW (ref 3.5–5.1)
Sodium: 137 mmol/L (ref 135–145)

## 2018-12-27 LAB — BRAIN NATRIURETIC PEPTIDE: B Natriuretic Peptide: 15.1 pg/mL (ref 0.0–100.0)

## 2018-12-27 MED ORDER — POTASSIUM CHLORIDE ER 10 MEQ PO TBCR: 30.0000 meq | EXTENDED_RELEASE_TABLET | Freq: Two times a day (BID) | ORAL | 6 refills | Status: DC

## 2018-12-27 MED ORDER — METOLAZONE 2.5 MG PO TABS: 2.5000 mg | ORAL_TABLET | Freq: Once | ORAL | 0 refills | Status: DC

## 2018-12-27 MED ORDER — ATENOLOL 50 MG PO TABS: 50.0000 mg | ORAL_TABLET | Freq: Every day | ORAL | 6 refills | Status: AC

## 2018-12-27 MED ORDER — TORSEMIDE 20 MG PO TABS: 40.0000 mg | ORAL_TABLET | Freq: Two times a day (BID) | ORAL | 11 refills | Status: AC

## 2018-12-27 MED ORDER — APIXABAN 5 MG PO TABS: 5.0000 mg | ORAL_TABLET | Freq: Two times a day (BID) | ORAL | 6 refills | Status: AC

## 2018-12-27 MED FILL — ELIQUIS 5 MG TABLET: 5 | 30 days supply | Qty: 60 | Fill #0

## 2018-12-27 MED FILL — ATENOLOL 50 MG TABLET: 50 | 30 days supply | Qty: 30 | Fill #0

## 2018-12-27 MED FILL — POTASSIUM CHLORIDE CRYS ER: 10 | 30 days supply | Qty: 180 | Fill #0

## 2018-12-27 MED FILL — metOLazone 2.5 MG TABS: 2.5 | 1 days supply | Qty: 1 | Fill #0

## 2018-12-27 MED FILL — TORSEMIDE 20 MG TABLET: 20 | 30 days supply | Qty: 120 | Fill #0

## 2018-12-27 NOTE — Telephone Encounter (Signed)
-----   Message from Alford Highland, NP sent at 12/27/2018  2:32 PM EST ----- Potassium is very low at 2.9 since has been out of potassium. She should be resuming 30 meq BID today with additional 40 meq with metolazone. Have her take an additional 40 meq tomorrow as well. See if she can come for a repeat BMET on Friday please. Thanks!

## 2018-12-27 NOTE — Patient Instructions (Addendum)
STOP Lasix  START Torsemide 40mg  (2 tabs) twice a day  Take Metolazone 2.5mg  (1 tab) today and Take additional 40 meq of potassium today.  Labs today We will only contact you if something comes back abnormal or we need to make some changes. Otherwise no news is good news!  Your physician recommends that you schedule a follow-up appointment in: 1 week with NP/PA clinic  Do the following things EVERYDAY: 1) Weigh yourself in the morning before breakfast. Write it down and keep it in a log. 2) Take your medicines as prescribed 3) Eat low salt foods-Limit salt (sodium) to 2000 mg per day.  4) Stay as active as you can everyday 5) Limit all fluids for the day to less than 2 liters

## 2018-12-27 NOTE — Progress Notes (Signed)
Work note
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
## 2018-12-27 NOTE — Telephone Encounter (Signed)
Pt aware of low potassium level.  Instructed in clinic today to take additional Potassium 40 meq today with metolazone. Pt aware to take an extra 40 meq of Potassium tomorrow as well and repeat blood work Friday. States understanding of all. Will RTO on Friday 930a to repeat BMET

## 2018-12-29 ENCOUNTER — Telehealth (HOSPITAL_COMMUNITY): Payer: Self-pay

## 2018-12-29 ENCOUNTER — Other Ambulatory Visit (HOSPITAL_COMMUNITY): Payer: Medicaid Other

## 2018-12-29 NOTE — Telephone Encounter (Signed)
Called patient this morning, VM full, unable to leave message. Pt missed appointment this morning for repeat lab work as K 2.9 on last visit. Pt was instructed to take extra x2 days. Will attempt another call today.

## 2018-12-29 NOTE — Telephone Encounter (Signed)
Attempt to reach patient regarding missed lab appointment unsuccessful. Unable to leave message due to vm being full

## 2019-01-02 MED FILL — methIMAzole 10 MG TABS: 10 | 30 days supply | Qty: 180 | Fill #1

## 2019-01-02 MED FILL — CHOLESTYRAMINE PACKET: 4 | 30 days supply | Qty: 60 | Fill #1

## 2019-01-03 ENCOUNTER — Encounter (HOSPITAL_COMMUNITY): Payer: Medicaid Other

## 2019-01-08 ENCOUNTER — Telehealth: Payer: Self-pay | Admitting: Family Medicine

## 2019-01-08 NOTE — Telephone Encounter (Signed)
Called patient to invite her to my TOP C clinic on 1/27. She agreed to 1/27 at 2pm. Routing to [place] to have her place patient in this slot. Encouraged her to bring all of her medications.

## 2019-01-08 NOTE — Telephone Encounter (Signed)
Appt made [place], Maryjo Rochester, CMA

## 2019-01-09 ENCOUNTER — Inpatient Hospital Stay (HOSPITAL_COMMUNITY)
Admission: RE | Admit: 2019-01-09 | Discharge: 2019-01-09 | Disposition: A | Discharge status: Home / Self Care | Payer: Medicaid Other | Source: Ambulatory Visit

## 2019-01-15 ENCOUNTER — Ambulatory Visit: Payer: Medicaid Other | Admitting: Family Medicine

## 2019-02-16 MED FILL — POTASSIUM CHLORIDE CRYS ER: 10 | 30 days supply | Qty: 180 | Fill #1

## 2019-02-16 MED FILL — ELIQUIS 5 MG TABLET: 5 | 30 days supply | Qty: 60 | Fill #1

## 2019-02-16 MED FILL — CHOLESTYRAMINE PACKET: 4 | 30 days supply | Qty: 60 | Fill #2

## 2019-02-16 MED FILL — TORSEMIDE 20 MG TABLET: 20 | 30 days supply | Qty: 120 | Fill #1 | Status: TO

## 2019-02-16 MED FILL — ATENOLOL 50 MG TABLET: 50 | 30 days supply | Qty: 30 | Fill #1 | Status: TO

## 2019-02-16 MED FILL — methIMAzole 10 MG TABS: 10 | 30 days supply | Qty: 180 | Fill #2 | Status: TO

## 2019-04-05 ENCOUNTER — Telehealth: Payer: Self-pay | Admitting: Licensed Clinical Social Worker

## 2019-04-05 MED FILL — TORSEMIDE 20 MG TABLET: 20 | 30 days supply | Qty: 120 | Fill #0

## 2019-04-05 MED FILL — ATENOLOL 50 MG TABLET: 50 | 30 days supply | Qty: 30 | Fill #0

## 2019-04-05 MED FILL — methIMAzole 10 MG TABS: 10 | 30 days supply | Qty: 180 | Fill #0

## 2019-04-05 NOTE — Telephone Encounter (Signed)
CSW received call from patient stating she has been out of work due to covid 19 and in need of gas money and groceries. Patient asking for assistance. CSW provided via clinic staff [employer] gift cards to help patient during this time. Patient very grateful and will pick up cards at clinic later today. CSW continues to be available as needed. [name], LCSW, CCSW-MCS [phone]

## 2019-05-29 ENCOUNTER — Telehealth (HOSPITAL_COMMUNITY): Payer: Self-pay | Admitting: Licensed Clinical Social Worker

## 2019-05-29 NOTE — Telephone Encounter (Signed)
CSW referred patient through [employer] for rent assistance and permanent housing options. Patient gave verbal consent and agreeable to referrals. CSW referred to [place] and [employer] for further assistance. Patient also given contact info for [employer], [employer] and [employer]. Patient verbalizes understanding of follow up and will return call to CSW as needed. [name], [place], [place]

## 2019-07-02 ENCOUNTER — Telehealth (HOSPITAL_COMMUNITY): Payer: Self-pay | Admitting: Licensed Clinical Social Worker

## 2019-07-02 NOTE — Telephone Encounter (Signed)
Patient called requesting an appointment as she has not been taking her medications. Patient states she needs assistance with obtaining medications. She reports all meds are at the pharmacy and she does not have the co pays. CSW assisted patient with patient care fund and obtained appointment for next week Thursday July 12, 2019 at 2pm. Patient grateful for the assistance. [name], [place], [place]

## 2019-07-03 MED FILL — ATENOLOL 50 MG TABLET: 50 | 30 days supply | Qty: 30 | Fill #0

## 2019-07-03 MED FILL — methIMAzole 10 MG TABS: 10 | 30 days supply | Qty: 180 | Fill #0

## 2019-07-03 MED FILL — TORSEMIDE 20 MG TABLET: 20 | 30 days supply | Qty: 120 | Fill #0

## 2019-07-09 MED FILL — POTASSIUM CHLORIDE CRYS ER: 10 | 30 days supply | Qty: 180 | Fill #2
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
## 2019-07-12 ENCOUNTER — Ambulatory Visit (HOSPITAL_COMMUNITY)
Admission: RE | Admit: 2019-07-12 | Discharge: 2019-07-12 | Disposition: A | Discharge status: Home / Self Care | Payer: Medicaid Other | Source: Ambulatory Visit | Attending: Internal Medicine | Admitting: Internal Medicine

## 2019-07-12 ENCOUNTER — Other Ambulatory Visit: Payer: Self-pay

## 2019-07-12 VITALS — BP 138/82 | HR 94 | Wt 321.4 lb

## 2019-07-12 DIAGNOSIS — J9622 Acute and chronic respiratory failure with hypercapnia: Secondary | ICD-10-CM

## 2019-07-12 DIAGNOSIS — Z8249 Family history of ischemic heart disease and other diseases of the circulatory system: Secondary | ICD-10-CM | POA: Insufficient documentation

## 2019-07-12 DIAGNOSIS — I5033 Acute on chronic diastolic (congestive) heart failure: Secondary | ICD-10-CM | POA: Insufficient documentation

## 2019-07-12 DIAGNOSIS — I4510 Unspecified right bundle-branch block: Secondary | ICD-10-CM | POA: Diagnosis not present

## 2019-07-12 DIAGNOSIS — Z7901 Long term (current) use of anticoagulants: Secondary | ICD-10-CM | POA: Diagnosis not present

## 2019-07-12 DIAGNOSIS — E0500 Thyrotoxicosis with diffuse goiter without thyrotoxic crisis or storm: Secondary | ICD-10-CM | POA: Insufficient documentation

## 2019-07-12 DIAGNOSIS — I471 Supraventricular tachycardia: Secondary | ICD-10-CM | POA: Insufficient documentation

## 2019-07-12 DIAGNOSIS — R0602 Shortness of breath: Secondary | ICD-10-CM | POA: Diagnosis not present

## 2019-07-12 DIAGNOSIS — J9601 Acute respiratory failure with hypoxia: Secondary | ICD-10-CM | POA: Diagnosis not present

## 2019-07-12 DIAGNOSIS — F4321 Adjustment disorder with depressed mood: Secondary | ICD-10-CM | POA: Diagnosis not present

## 2019-07-12 DIAGNOSIS — I110 Hypertensive heart disease with heart failure: Secondary | ICD-10-CM | POA: Diagnosis not present

## 2019-07-12 DIAGNOSIS — Z79899 Other long term (current) drug therapy: Secondary | ICD-10-CM | POA: Insufficient documentation

## 2019-07-12 DIAGNOSIS — I252 Old myocardial infarction: Secondary | ICD-10-CM | POA: Insufficient documentation

## 2019-07-12 DIAGNOSIS — I5032 Chronic diastolic (congestive) heart failure: Secondary | ICD-10-CM

## 2019-07-12 DIAGNOSIS — I444 Left anterior fascicular block: Secondary | ICD-10-CM | POA: Insufficient documentation

## 2019-07-12 DIAGNOSIS — R9431 Abnormal electrocardiogram [ECG] [EKG]: Secondary | ICD-10-CM | POA: Insufficient documentation

## 2019-07-12 DIAGNOSIS — Z7952 Long term (current) use of systemic steroids: Secondary | ICD-10-CM | POA: Diagnosis not present

## 2019-07-12 DIAGNOSIS — E119 Type 2 diabetes mellitus without complications: Secondary | ICD-10-CM | POA: Diagnosis not present

## 2019-07-12 LAB — CBC
HCT: 43.6 % (ref 36.0–46.0)
Hemoglobin: 13.5 g/dL (ref 12.0–15.0)
MCH: 24.1 pg — ABNORMAL LOW (ref 26.0–34.0)
MCHC: 31 g/dL (ref 30.0–36.0)
MCV: 77.9 fL — ABNORMAL LOW (ref 80.0–100.0)
Platelets: 308 10*3/uL (ref 150–400)
RBC: 5.6 MIL/uL — ABNORMAL HIGH (ref 3.87–5.11)
RDW: 13.2 % (ref 11.5–15.5)
WBC: 9.6 10*3/uL (ref 4.0–10.5)
nRBC: 0 % (ref 0.0–0.2)

## 2019-07-12 LAB — BASIC METABOLIC PANEL
Anion gap: 12 (ref 5–15)
BUN: 11 mg/dL (ref 6–20)
CO2: 26 mmol/L (ref 22–32)
Calcium: 8.8 mg/dL — ABNORMAL LOW (ref 8.9–10.3)
Chloride: 100 mmol/L (ref 98–111)
Creatinine, Ser: 0.71 mg/dL (ref 0.44–1.00)
GFR calc Af Amer: >60 mL/min (ref >60)
GFR calc non Af Amer: >60 mL/min (ref >60)
Glucose, Bld: 99 mg/dL (ref 70–99)
Potassium: 2.9 mmol/L — ABNORMAL LOW (ref 3.5–5.1)
Sodium: 138 mmol/L (ref 135–145)

## 2019-07-12 LAB — TSH: TSH: <0.01 u[IU]/mL — ABNORMAL LOW (ref 0.350–4.500)

## 2019-07-12 LAB — T4, FREE: Free T4: 1.22 ng/dL — ABNORMAL HIGH (ref 0.61–1.12)

## 2019-07-12 LAB — BRAIN NATRIURETIC PEPTIDE: B Natriuretic Peptide: 26.2 pg/mL (ref 0.0–100.0)

## 2019-07-12 MED ORDER — METOLAZONE 2.5 MG PO TABS: 2.5000 mg | ORAL_TABLET | ORAL | 3 refills | Status: DC

## 2019-07-12 NOTE — Progress Notes (Signed)
ReDS Vest - 07/12/19 1400      ReDS Vest   Fitting Posture  Sitting    Height Marker  Tall    Ruler Value  39    Center Strip  Aligned    ReDS Value  41

## 2019-07-12 NOTE — Progress Notes (Addendum)
  Patient ambulated ~ 400 feet  SATURATION QUALIFICATIONS: (This note is used to comply with regulatory documentation for home oxygen)  Patient Saturations on Room Air at Rest = 88%  Patient Saturations on Room Air while Ambulating = 86%  Patient Saturations on 2 Liters of oxygen while Ambulating = 97%  Please briefly explain why patient needs home oxygen:sob, CHF

## 2019-07-12 NOTE — Patient Instructions (Addendum)
Take Metolazone 2.5mg  (1 tab) today and tomorrow  Take an extra Potassium 49meq (4 tabs) today and tomorrow (with metolazone)  Labs today We will only contact you if something comes back abnormal or we need to make some changes. Otherwise no news is good news!  Your physician recommends that you schedule a follow-up appointment in: 1 week with nurse practitioner   At the [facility], you and your health needs are our priority. As part of our continuing mission to provide you with exceptional heart care, we have created designated Provider Care Teams. These Care Teams include your primary Cardiologist (physician) and Advanced Practice Providers (APPs- Physician Assistants and Nurse Practitioners) who all work together to provide you with the care you need, when you need it.   You may see any of the following providers on your designated Care Team at your next follow up: Marland Kitchen Dr Glori Bickers . Dr Loralie Champagne . Darrick Grinder, NP

## 2019-07-13 ENCOUNTER — Telehealth (HOSPITAL_COMMUNITY): Payer: Self-pay

## 2019-07-13 LAB — T3, FREE: T3, Free: 4 pg/mL (ref 2.0–4.4)

## 2019-07-13 MED ORDER — POTASSIUM CHLORIDE ER 10 MEQ PO TBCR: 40.0000 meq | EXTENDED_RELEASE_TABLET | Freq: Two times a day (BID) | ORAL | 6 refills | Status: AC

## 2019-07-13 NOTE — Addendum Note (Signed)
Encounter addended by: Louann Liv, LCSW on: 07/13/2019 9:47 AM  Actions taken: Clinical Note Signed

## 2019-07-13 NOTE — Progress Notes (Signed)
CSW met with patient in the clinic. Patient states she needs assistance with medications as she is unable to make the co pay due to covid-19 although reports she just started a new job. Patient resides at home with her 3 children and recently had difficulties with back rent payments. She reports she was assisted by [employer] with back rent payments and is currently all caught up on rent. Patient is up in her weight and states she is having difficulty with SOB upon exertion. Patient was ordered home o2 and hopeful with the O2 support she will feel better. CSW assisted with medications and also provided support for food needs. Patient verbalizes understanding and will remain in contact with CSW and clinic staff to avoid missed medications and aim for improved health. CSW continues to follow as needed. [name], [place], [place]

## 2019-07-13 NOTE — Telephone Encounter (Signed)
-----   Message from Conrad Burr, NP sent at 07/13/2019  1:20 PM EDT ----- Restarted methimazole today by Dr Haroldine Laws. BNP low. K 2.9 . Increase potassium to 40 meq twice a day. Please call.

## 2019-07-13 NOTE — Telephone Encounter (Signed)
Called pt to review lab work. Pt complained that every time she takes potassium it causes her legs to cramp. Educated pt on signs and symptoms of hypokalemia. Pt verbalized understanding and is agreeable to med changes.

## 2019-07-19 ENCOUNTER — Encounter (HOSPITAL_COMMUNITY): Payer: Medicaid Other

## 2019-08-16 MED FILL — methIMAzole 10 MG TABS: 10 | 30 days supply | Qty: 180 | Fill #1

## 2019-08-16 MED FILL — ATENOLOL 50 MG TABLET: 50 | 30 days supply | Qty: 30 | Fill #1

## 2019-08-16 MED FILL — TORSEMIDE 20 MG TABLET: 20 | 30 days supply | Qty: 120 | Fill #1

## 2019-08-28 MED FILL — POTASSIUM CHLORIDE CRYS ER: 10 | 30 days supply | Qty: 180 | Fill #3

## 2019-09-14 ENCOUNTER — Encounter: Payer: Medicaid Other | Admitting: Family Medicine

## 2019-10-19 MED FILL — ATENOLOL 50 MG TABLET: 50 | 30 days supply | Qty: 30 | Fill #2

## 2019-10-19 MED FILL — POTASSIUM CHLORIDE CRYS ER: 10 | 30 days supply | Qty: 180 | Fill #4

## 2019-10-19 MED FILL — TORSEMIDE 20 MG TABLET: 20 | 30 days supply | Qty: 120 | Fill #2

## 2019-10-22 ENCOUNTER — Other Ambulatory Visit (HOSPITAL_COMMUNITY): Payer: Self-pay

## 2019-10-22 MED ORDER — METOLAZONE 2.5 MG PO TABS: 2.5000 mg | ORAL_TABLET | ORAL | 3 refills | Status: DC

## 2019-10-22 MED FILL — methIMAzole 10 MG TABS: 10 | 30 days supply | Qty: 180 | Fill #0

## 2019-12-04 ENCOUNTER — Telehealth (HOSPITAL_COMMUNITY): Payer: Self-pay

## 2019-12-04 NOTE — Telephone Encounter (Signed)
Received a request for Medical Records from [facility]. Faxed to [phone].

## 2020-07-15 ENCOUNTER — Other Ambulatory Visit (HOSPITAL_COMMUNITY): Payer: Self-pay | Admitting: Adult Health

## 2022-05-25 ENCOUNTER — Encounter: Payer: Self-pay | Admitting: *Deleted
